# Patient Record
Sex: Male | Born: 1945 | Race: White | Hispanic: No | Marital: Married | Smoking: Former smoker
Health system: Southern US, Community
[De-identification: ages and names within clinical notes are randomized; demographics above are authoritative.]

## PROBLEM LIST (undated history)

## (undated) DIAGNOSIS — R351 Nocturia: Principal | ICD-10-CM

## (undated) DIAGNOSIS — M1711 Unilateral primary osteoarthritis, right knee: Secondary | ICD-10-CM

## (undated) DIAGNOSIS — E785 Hyperlipidemia, unspecified: Secondary | ICD-10-CM

## (undated) DIAGNOSIS — C801 Malignant (primary) neoplasm, unspecified: Secondary | ICD-10-CM

## (undated) DIAGNOSIS — R55 Syncope and collapse: Secondary | ICD-10-CM

## (undated) DIAGNOSIS — I4510 Unspecified right bundle-branch block: Secondary | ICD-10-CM

## (undated) DIAGNOSIS — I4891 Unspecified atrial fibrillation: Secondary | ICD-10-CM

## (undated) DIAGNOSIS — E291 Testicular hypofunction: Secondary | ICD-10-CM

## (undated) DIAGNOSIS — Z8489 Family history of other specified conditions: Secondary | ICD-10-CM

## (undated) DIAGNOSIS — N529 Male erectile dysfunction, unspecified: Secondary | ICD-10-CM

## (undated) DIAGNOSIS — Z950 Presence of cardiac pacemaker: Secondary | ICD-10-CM

## (undated) DIAGNOSIS — M5136 Other intervertebral disc degeneration, lumbar region: Secondary | ICD-10-CM

## (undated) DIAGNOSIS — R3915 Urgency of urination: Secondary | ICD-10-CM

## (undated) DIAGNOSIS — Z7982 Long term (current) use of aspirin: Secondary | ICD-10-CM

## (undated) DIAGNOSIS — C4491 Basal cell carcinoma of skin, unspecified: Secondary | ICD-10-CM

## (undated) DIAGNOSIS — K219 Gastro-esophageal reflux disease without esophagitis: Secondary | ICD-10-CM

## (undated) DIAGNOSIS — I700 Atherosclerosis of aorta: Secondary | ICD-10-CM

## (undated) DIAGNOSIS — I493 Ventricular premature depolarization: Secondary | ICD-10-CM

## (undated) DIAGNOSIS — G4730 Sleep apnea, unspecified: Secondary | ICD-10-CM

## (undated) DIAGNOSIS — M5416 Radiculopathy, lumbar region: Secondary | ICD-10-CM

## (undated) DIAGNOSIS — M51369 Other intervertebral disc degeneration, lumbar region without mention of lumbar back pain or lower extremity pain: Secondary | ICD-10-CM

## (undated) DIAGNOSIS — M5126 Other intervertebral disc displacement, lumbar region: Secondary | ICD-10-CM

## (undated) DIAGNOSIS — I10 Essential (primary) hypertension: Secondary | ICD-10-CM

## (undated) DIAGNOSIS — N401 Enlarged prostate with lower urinary tract symptoms: Secondary | ICD-10-CM

## (undated) DIAGNOSIS — R0609 Other forms of dyspnea: Secondary | ICD-10-CM

## (undated) DIAGNOSIS — M1990 Unspecified osteoarthritis, unspecified site: Secondary | ICD-10-CM

## (undated) DIAGNOSIS — I480 Paroxysmal atrial fibrillation: Secondary | ICD-10-CM

## (undated) DIAGNOSIS — Z7901 Long term (current) use of anticoagulants: Secondary | ICD-10-CM

## (undated) DIAGNOSIS — I639 Cerebral infarction, unspecified: Secondary | ICD-10-CM

## (undated) DIAGNOSIS — C4492 Squamous cell carcinoma of skin, unspecified: Secondary | ICD-10-CM

## (undated) DIAGNOSIS — D759 Disease of blood and blood-forming organs, unspecified: Secondary | ICD-10-CM

## (undated) DIAGNOSIS — I442 Atrioventricular block, complete: Secondary | ICD-10-CM

## (undated) DIAGNOSIS — R350 Frequency of micturition: Secondary | ICD-10-CM

## (undated) HISTORY — DX: Testicular hypofunction: E29.1

## (undated) HISTORY — DX: Essential (primary) hypertension: I10

## (undated) HISTORY — DX: Male erectile dysfunction, unspecified: N52.9

## (undated) HISTORY — PX: UVULOPALATOPHARYNGOPLASTY (UPPP)/TONSILLECTOMY/SEPTOPLASTY: SHX6164

## (undated) HISTORY — DX: Presence of cardiac pacemaker: Z95.0

## (undated) HISTORY — DX: Sleep apnea, unspecified: G47.30

## (undated) HISTORY — DX: Unspecified osteoarthritis, unspecified site: M19.90

## (undated) HISTORY — DX: Benign prostatic hyperplasia with lower urinary tract symptoms: N40.1

## (undated) HISTORY — DX: Urgency of urination: R39.15

## (undated) HISTORY — DX: Frequency of micturition: R35.0

## (undated) HISTORY — DX: Paroxysmal atrial fibrillation: I48.0

## (undated) HISTORY — PX: INGUINAL HERNIA REPAIR: SUR1180

## (undated) HISTORY — DX: Hyperlipidemia, unspecified: E78.5

## (undated) HISTORY — DX: Nocturia: R35.1

---

## 1993-08-27 HISTORY — PX: KNEE SURGERY: SHX244

## 2000-08-27 HISTORY — PX: EYE SURGERY: SHX253

## 2004-08-04 ENCOUNTER — Ambulatory Visit: Payer: Self-pay | Admitting: Unknown Physician Specialty

## 2007-11-05 ENCOUNTER — Ambulatory Visit: Payer: Self-pay | Admitting: Unknown Physician Specialty

## 2010-09-14 ENCOUNTER — Ambulatory Visit: Payer: Self-pay | Admitting: Internal Medicine

## 2010-10-05 ENCOUNTER — Ambulatory Visit: Payer: Self-pay | Admitting: Internal Medicine

## 2012-12-11 ENCOUNTER — Ambulatory Visit: Payer: Self-pay | Admitting: Unknown Physician Specialty

## 2012-12-12 LAB — PATHOLOGY REPORT

## 2013-07-14 ENCOUNTER — Ambulatory Visit: Payer: Self-pay | Admitting: Otolaryngology

## 2013-07-14 LAB — POTASSIUM: Potassium: 3.6 mmol/L (ref 3.5–5.1)

## 2013-07-30 ENCOUNTER — Ambulatory Visit: Payer: Self-pay | Admitting: Otolaryngology

## 2013-08-04 LAB — PATHOLOGY REPORT

## 2014-01-08 IMAGING — US US ABDOMEN COMPLETE
1 series · 14 of 25 positions shown · non-contrast
Comparison: None.

CLINICAL DATA: Right upper quadrant pain

EXAM:
ULTRASOUND ABDOMEN COMPLETE

[Series 1: us abdomen complete · 0.25mm/px · 14 of 94 slices shown]
[im 1/94]
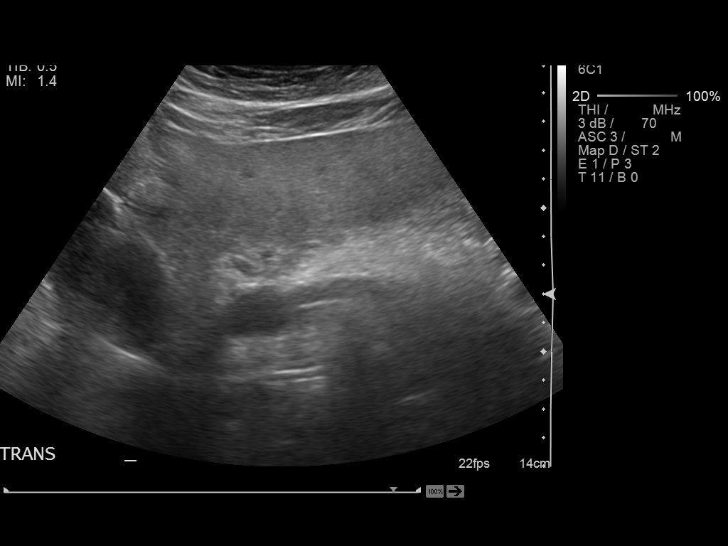
[im 8/94]
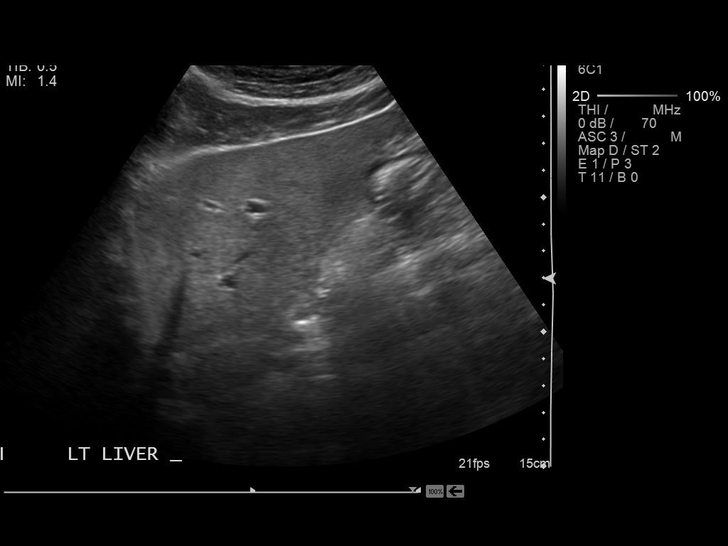
[im 16/94]
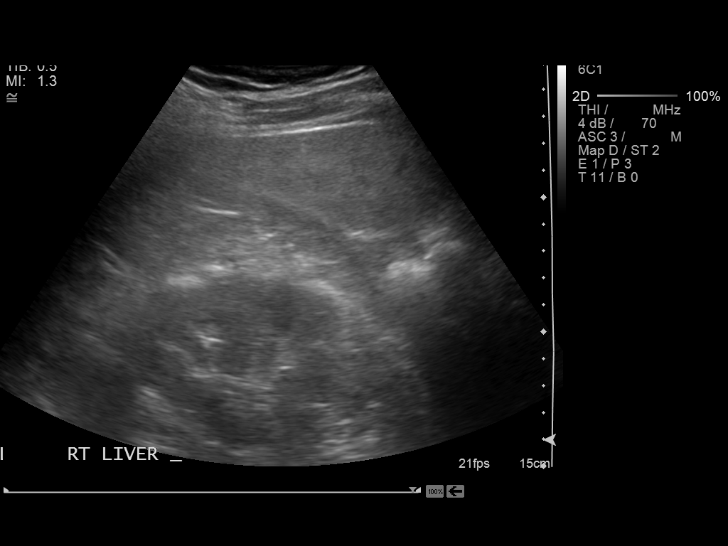
[im 24/94]
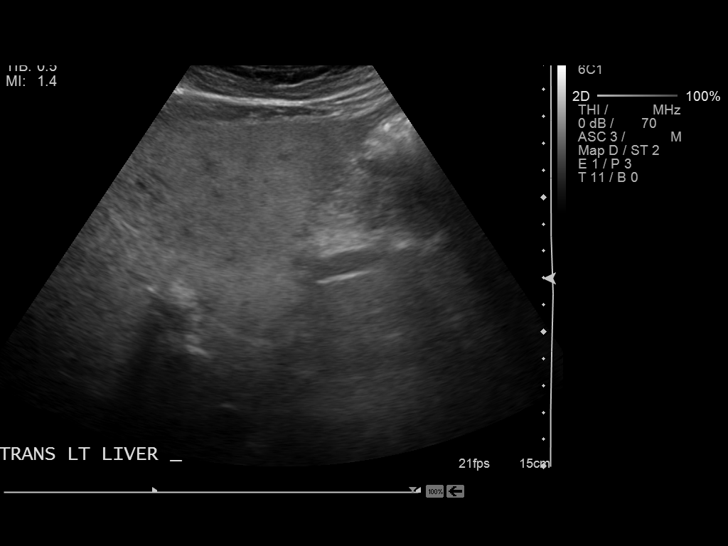
[im 32/94]
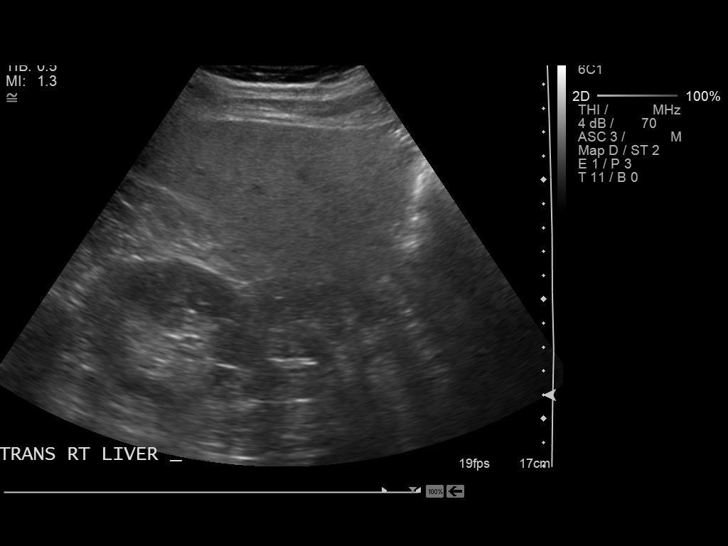
[im 35/94]
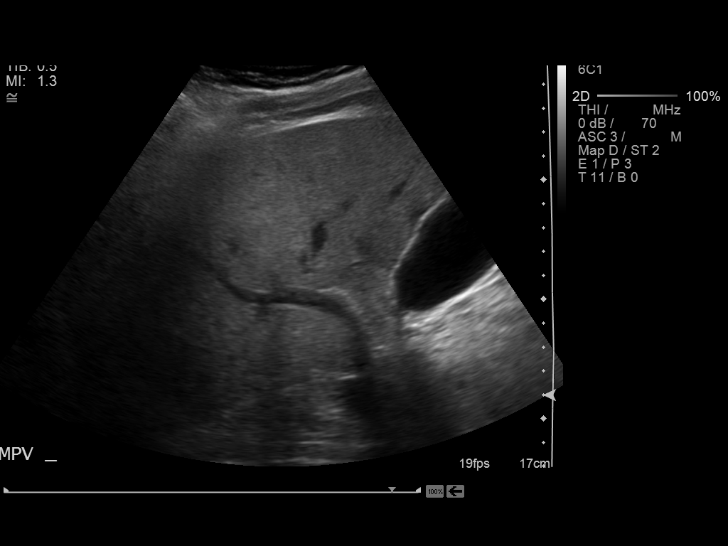
[im 43/94]
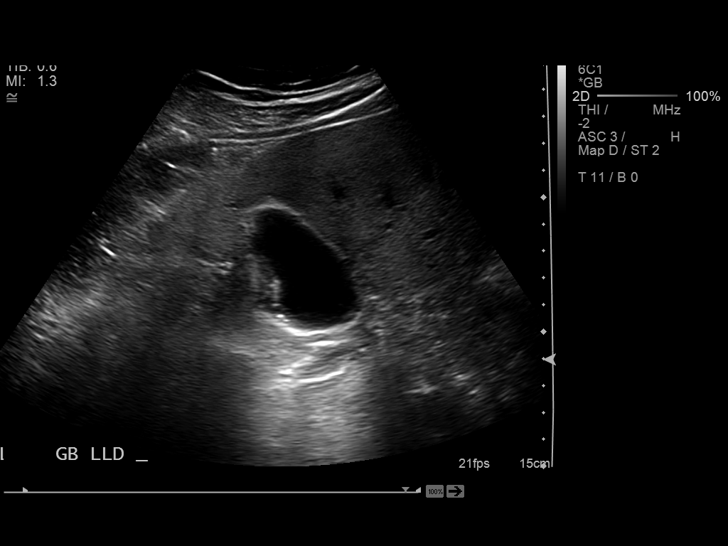
[im 51/94]
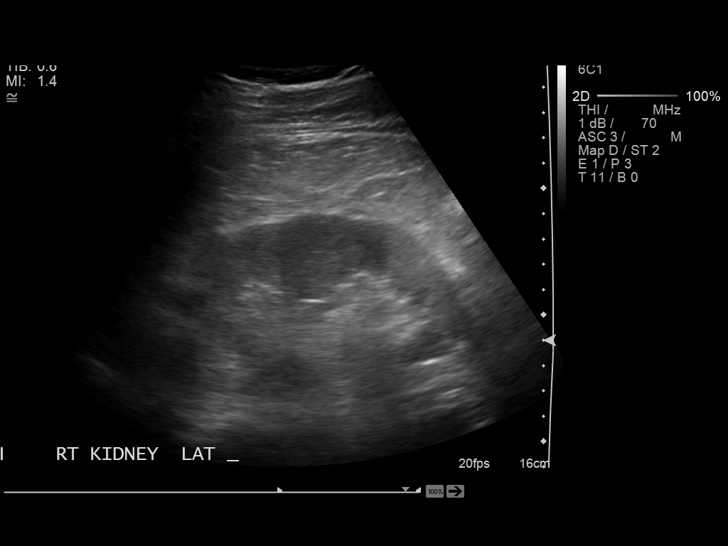
[im 59/94]
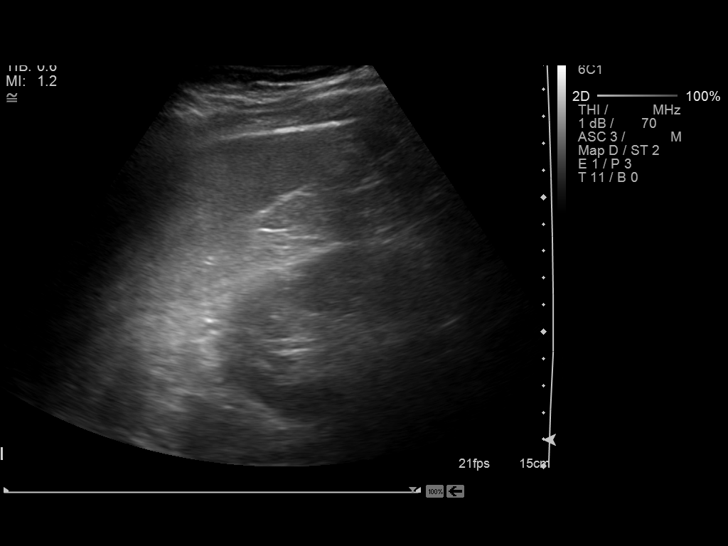
[im 63/94]
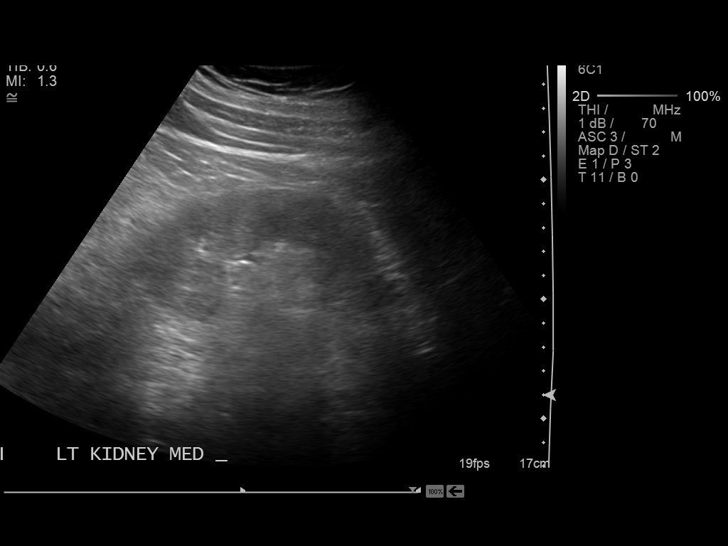
[im 70/94]
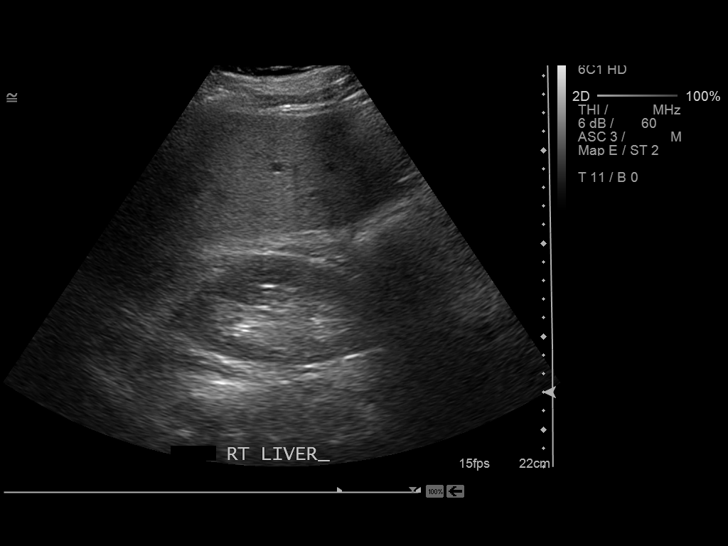
[im 78/94]
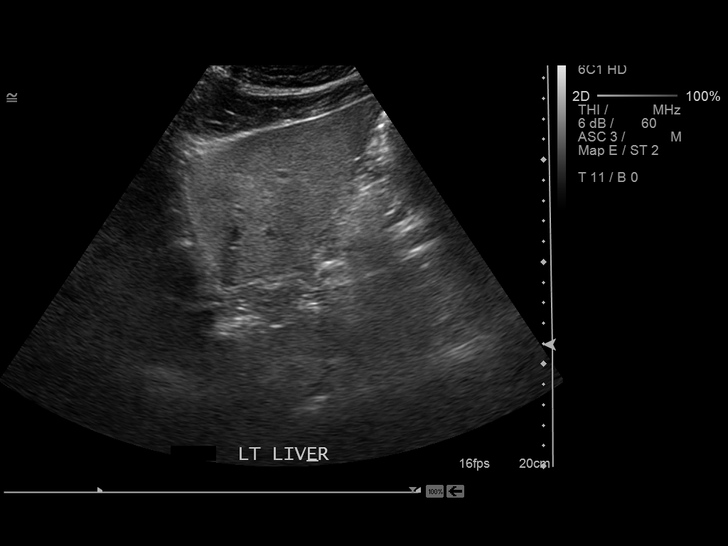
[im 86/94]
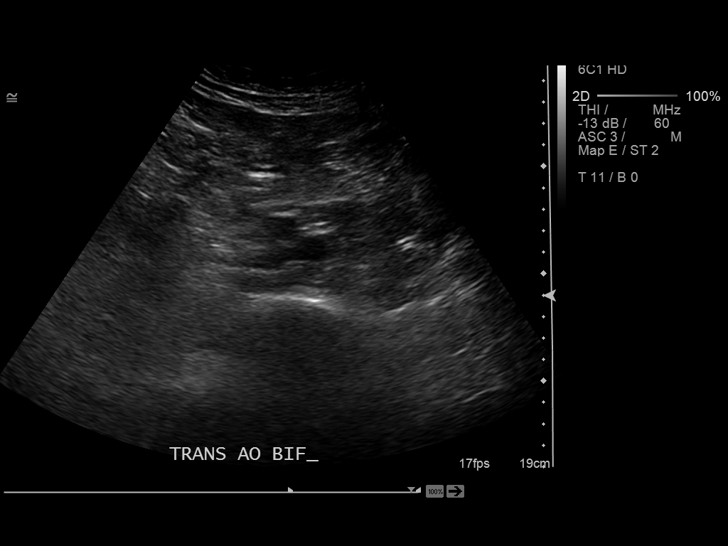
[im 94/94]
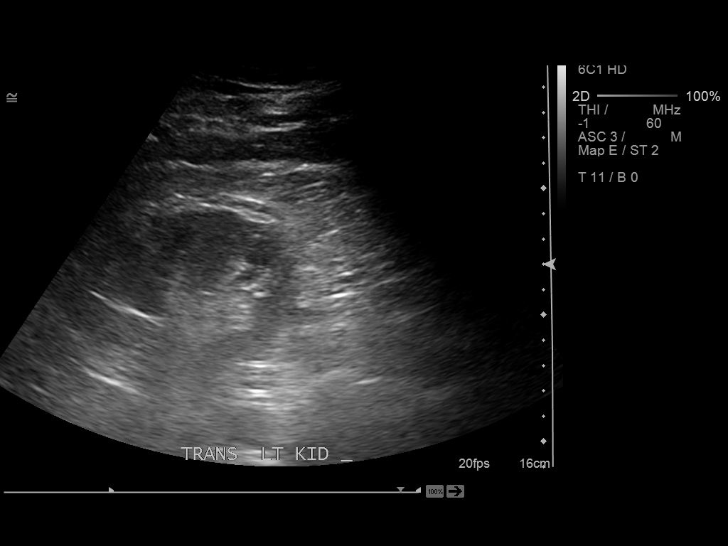

[14 of 25 positions shown; findings below may reference images not displayed]

FINDINGS: Gallbladder: Several gallstones are present. The largest is 3 mm. No
wall thickening or Murphy's sign.

Common bile duct: Diameter: 2 mm

Liver: Increased echogenicity throughout the liver is present
without focal mass.

IVC: No abnormality visualized.

Pancreas: Visualized portion unremarkable.

Spleen: Size and appearance within normal limits.

Right Kidney: Length: 12.9 cm. Echogenicity within normal limits. No
mass or hydronephrosis visualized.

Left Kidney: Length: 12.7 cm. Echogenicity within normal limits. No
mass or hydronephrosis visualized.

Abdominal aorta: Maximal aortic diameter is 2.7 cm.

Other findings: None.
IMPRESSION: Cholelithiasis.

Diffuse hepatic steatosis.

Maximal aortic diameter is 2.7 cm. Ectatic abdominal aorta at risk
for aneurysm development. Recommend followup by ultrasound in 5
years. This recommendation follows ACR consensus guidelines: White
Paper of the ACR Incidental Findings Committee II on Vascular
Findings. [HOSPITAL] [7X]; [DATE].

## 2014-12-17 NOTE — Op Note (Signed)
PATIENT NAME:  Tyrone Luna, Tyrone Luna MR#:  [id number] DATE OF BIRTH:  October 14, 1945  DATE OF PROCEDURE:  07/30/2013  SURGEON:  Janalee Dane, MD  PREOPERATIVE DIAGNOSIS: Obstructive sleep apnea (intolerance of CPAP).  POSTOPERATIVE DIAGNOSIS: Obstructive sleep apnea (intolerance of CPAP).  PROCEDURE:  Uvulopalatopharyngoplasty with tonsillectomy   DESCRIPTION OF PROCEDURE: The patient was identified in the holding area, brought back to the operating room and placed in the supine position on the operating room table. After general endotracheal anesthesia had been induced, the patient was turned 90 degrees clockwise from anesthesia. A Dingman mouth retractor was placed and the oropharynx was carefully examined. There were small atrophic tonsils and the uvula was thickened and elongated. Approximately 12 mm of soft palate was estimated for safe removal. Beginning on the patient's left side, the tonsil was grasped with tonsil tenaculum retracted to the midline and Bovie electrocautery was used to dissect the tonsil from the tonsillar fossa. An identical procedure was performed on the right tonsil and cautery was used to stop pinpoint bleeding. Darts were then taken from the posterior tonsillar pillar posteriorly using Bovie electrocautery. The amount of soft palate that needed to be resected was resected with Bovie electrocautery and the nasopharyngeal mucosa with muscle to add to the purchase of the suture was sutured to the oropharyngeal muscle and mucosa. The anterior and posterior tonsillar pillars were sutured in a similar fashion. Once the tonsillar pillars and the nasopharyngeal and oropharyngeal closures had been accomplished, 0.5% plain bupivacaine was injected. The patient was then returned to anesthesia, allowed to emerge from anesthesia in the operating room, taken to the recovery room in stable condition. There were no complications. Estimated blood loss 15 mL.   ____________________________ J.  Nadeen Landau, MD j[facility]:ce D: 07/30/2013 17:35:31 ET T: 07/30/2013 20:39:25 ET JOB#: [id number]  cc: Janalee Dane, MD, <Dictator> Nicholos Johns MD ELECTRONICALLY SIGNED 07/31/2013 13:42

## 2015-02-02 ENCOUNTER — Other Ambulatory Visit: Payer: Self-pay | Admitting: Internal Medicine

## 2015-02-02 DIAGNOSIS — R1011 Right upper quadrant pain: Secondary | ICD-10-CM

## 2015-02-03 ENCOUNTER — Ambulatory Visit
Admission: RE | Admit: 2015-02-03 | Discharge: 2015-02-03 | Disposition: A | Discharge status: Home / Self Care | Payer: Medicare Other | Source: Ambulatory Visit | Attending: Internal Medicine | Admitting: Internal Medicine

## 2015-02-03 DIAGNOSIS — K8020 Calculus of gallbladder without cholecystitis without obstruction: Secondary | ICD-10-CM | POA: Insufficient documentation

## 2015-02-03 DIAGNOSIS — K760 Fatty (change of) liver, not elsewhere classified: Secondary | ICD-10-CM | POA: Diagnosis not present

## 2015-02-03 DIAGNOSIS — R1011 Right upper quadrant pain: Secondary | ICD-10-CM

## 2015-03-22 ENCOUNTER — Ambulatory Visit (INDEPENDENT_AMBULATORY_CARE_PROVIDER_SITE_OTHER): Payer: Medicare Other | Admitting: Urology

## 2015-03-22 ENCOUNTER — Encounter: Payer: Self-pay | Admitting: Urology

## 2015-03-22 VITALS — BP 165/87 | HR 60 | Ht 73.0 in | Wt 262.8 lb

## 2015-03-22 DIAGNOSIS — G4730 Sleep apnea, unspecified: Secondary | ICD-10-CM | POA: Insufficient documentation

## 2015-03-22 DIAGNOSIS — I10 Essential (primary) hypertension: Secondary | ICD-10-CM | POA: Insufficient documentation

## 2015-03-22 DIAGNOSIS — N528 Other male erectile dysfunction: Secondary | ICD-10-CM | POA: Diagnosis not present

## 2015-03-22 DIAGNOSIS — N401 Enlarged prostate with lower urinary tract symptoms: Secondary | ICD-10-CM | POA: Diagnosis not present

## 2015-03-22 DIAGNOSIS — E785 Hyperlipidemia, unspecified: Secondary | ICD-10-CM | POA: Insufficient documentation

## 2015-03-22 DIAGNOSIS — R351 Nocturia: Secondary | ICD-10-CM | POA: Diagnosis not present

## 2015-03-22 DIAGNOSIS — E291 Testicular hypofunction: Secondary | ICD-10-CM | POA: Insufficient documentation

## 2015-03-22 DIAGNOSIS — N529 Male erectile dysfunction, unspecified: Secondary | ICD-10-CM | POA: Insufficient documentation

## 2015-03-22 DIAGNOSIS — Z9109 Other allergy status, other than to drugs and biological substances: Secondary | ICD-10-CM | POA: Insufficient documentation

## 2015-03-22 LAB — MICROSCOPIC EXAMINATION
Bacteria, UA: NONE SEEN
Epithelial Cells (non renal): NONE SEEN /hpf (ref 0–10)
RBC, UA: NONE SEEN /hpf (ref 0–?)

## 2015-03-22 LAB — URINALYSIS, COMPLETE
Bilirubin, UA: NEGATIVE
Glucose, UA: NEGATIVE
Ketones, UA: NEGATIVE
Leukocytes, UA: NEGATIVE
Nitrite, UA: NEGATIVE
Protein, UA: NEGATIVE
RBC, UA: NEGATIVE
Specific Gravity, UA: 1.02 (ref 1.005–1.030)
Urobilinogen, Ur: 0.2 mg/dL (ref 0.2–1.0)
pH, UA: 6.5 (ref 5.0–7.5)

## 2015-03-22 LAB — BLADDER SCAN AMB NON-IMAGING

## 2015-03-22 MED ORDER — SOLIFENACIN SUCCINATE 5 MG PO TABS: 5.0000 mg | ORAL_TABLET | Freq: Every day | ORAL | Status: DC

## 2015-03-22 MED ORDER — TADALAFIL 20 MG PO TABS: 20.0000 mg | ORAL_TABLET | Freq: Every day | ORAL | Status: DC | PRN

## 2015-03-22 MED ORDER — FESOTERODINE FUMARATE ER 4 MG PO TB24: 4.0000 mg | ORAL_TABLET | Freq: Every day | ORAL | Status: DC

## 2015-03-22 NOTE — Progress Notes (Signed)
03/22/2015 5:06 PM   Tyrone Luna [birth year] [id number]  Referring provider: No referring provider defined for this encounter.  Chief Complaint  Patient presents with  . Benign Prostatic Hypertrophy    38month   HPI: 69year old male who returns today for management of  urinary symptom primarily urgency/ frequency and nocturia.  He has kept a voiding diary which he brings with him today.    He has been taking Avodart for years now and is previously tried Rapaflo and Flomax but had to discontinue these 2 medications for various reasons including dizziness.  IPSS 21/4 at last visit, 13/3 today.    No dysuria or hematuria. History of flank pain or kidney stones. No history of urinary tract infections.  He was previously drinking  does endorse drinking 2-4 cups but has been cutting back some.  He also continues to drink 2-3 20 ounce bottles of Diet Coke each day.    He also has a history of obstructive sleep apnea and underwent a uvulectomy as well as tonsillectomy by Dr. CCarlis Abbotta few years ago. He has not had a follow-up sleep study since his surgery although his wife notes that his snoring has improved.  He has not yet seen his PCP about this, has a follow up last night.   He does also have significant bilateral large lower extremity edema which is worse in the evening time. He does not take Lasix but is on hydrochlorothiazide/triamterene.  He also has a history of baseline erectile dysfunction. Last visit he was prescribed Cialis 20 mg with some partial response.    PSA 0.66 on 04/2014.  DRE 11/2014 30 cc, no nodules, nontender.        IPSS      03/22/15 1000       International Prostate Symptom Score   How often have you had the sensation of not emptying your bladder? Less than 1 in 5     How often have you had to urinate less than every two hours? Almost always     How often have you found you stopped and started again several times when you urinated?  Less than 1 in 5 times     How often have you found it difficult to postpone urination? Less than half the time     How often have you had a weak urinary stream? Not at All     How often have you had to strain to start urination? Not at All     How many times did you typically get up at night to urinate? 4 Times     Total IPSS Score 13     Quality of Life due to urinary symptoms   If you were to spend the rest of your life with your urinary condition just the way it is now how would you feel about that? Mixed         PMH: Past Medical History  Diagnosis Date  . Arthritis   . Hyperlipemia   . Sleep apnea   . ED (erectile dysfunction)   . Male hypogonadism   . Hypertension   . BPH associated with nocturia   . Urinary frequency   . Urinary urgency     Surgical History: Past Surgical History  Procedure Laterality Date  . Eye surgery Bilateral 2002    laser  . Knee surgery Right 1995  . Inguinal hernia repair Left     Home Medications:    Medication List  This list is accurate as of: 03/22/15 11:59 PM.  Always use your most recent med list.               amLODipine 10 MG tablet  Commonly known as:  NORVASC     aspirin 81 MG tablet  Take 81 mg by mouth daily.     cetirizine 10 MG tablet  Commonly known as:  ZYRTEC  Take 10 mg by mouth daily.     dutasteride 0.5 MG capsule  Commonly known as:  AVODART     fesoterodine 4 MG Tb24 tablet  Commonly known as:  TOVIAZ  Take 1 tablet (4 mg total) by mouth daily.     GLUCOSAMINE 1500 COMPLEX PO  Take by mouth.     pilocarpine 5 MG tablet  Commonly known as:  SALAGEN     PRESERVISION AREDS 2 PO  Take by mouth.     simvastatin 20 MG tablet  Commonly known as:  ZOCOR     tadalafil 20 MG tablet  Commonly known as:  CIALIS  Take 1 tablet (20 mg total) by mouth daily as needed for erectile dysfunction.     triamterene-hydrochlorothiazide 37.5-25 MG per capsule  Commonly known as:  DYAZIDE         Allergies: No Known Allergies  Family History: Family History  Problem Relation Age of Onset  . Prostate cancer Neg Hx   . Bladder Cancer Neg Hx   . Kidney cancer Neg Hx     Social History:  reports that he has quit smoking. He does not have any smokeless tobacco history on file. He reports that he does not drink alcohol or use illicit drugs.  ROS: UROLOGY Frequent Urination?: Yes Hard to postpone urination?: No Burning/pain with urination?: No Get up at night to urinate?: Yes Leakage of urine?: No Urine stream starts and stops?: No Trouble starting stream?: No Do you have to strain to urinate?: No Blood in urine?: No Urinary tract infection?: No Sexually transmitted disease?: No Injury to kidneys or bladder?: No Painful intercourse?: No Weak stream?: No Erection problems?: Yes Penile pain?: No  Gastrointestinal Nausea?: No Vomiting?: No Indigestion/heartburn?: No Diarrhea?: No Constipation?: No  Constitutional Fever: No Night sweats?: Yes Weight loss?: No Fatigue?: No  Skin Skin rash/lesions?: No Itching?: No  Eyes Blurred vision?: No Double vision?: No  Ears/Nose/Throat Sore throat?: No Sinus problems?: No  Hematologic/Lymphatic Swollen glands?: No Easy bruising?: No  Cardiovascular Leg swelling?: Yes Chest pain?: No  Respiratory Cough?: No Shortness of breath?: No  Endocrine Excessive thirst?: No  Musculoskeletal Back pain?: No Joint pain?: No  Neurological Headaches?: No Dizziness?: No  Psychologic Depression?: No Anxiety?: No  Physical Exam: BP 165/87 mmHg  Pulse 60  Ht _0  (1.854 m)  Wt 262 lb 12.8 oz (119.205 kg)  BMI 34.68 kg/m2  Constitutional:  Alert and oriented, No acute distress. HEENT: [place] AT, moist mucus membranes.  Trachea midline, no masses. Cardiovascular: No clubbing, cyanosis, or edema. Respiratory: Normal respiratory effort, no increased work of breathing. GI: Abdomen is soft, nontender,  nondistended, no abdominal masses GU: No CVA tenderness.  Skin: No rashes, bruises or suspicious lesions. Neurologic: Grossly intact, no focal deficits, moving all 4 extremities. Psychiatric: Normal mood and affect.  Laboratory Data: Comprehensive Metabolic Panel (CMP) - Final result (02/02/2015 3:56 PM) Comprehensive Metabolic Panel (CMP) - Final result (02/02/2015 3:56 PM)  Component Value Range  Glucose 103 70-110 mg/dL  Sodium 138 136-145 mmol/L  Potassium 3.5 (L)  3.6-5.1 mmol/L  Chloride 102 97-109 mmol/L  Carbon Dioxide (CO2) 30.7 22.0-32.0 mmol/L  Urea Nitrogen (BUN) 13 7-25 mg/dL  Creatinine 0.9 0.7-1.3 mg/dL  Glomerular Filtration Rate (eGFR), MDRD Estimate 84 >60 mL/min/1.73sq m     Urinalysis Results for orders placed or performed in visit on 03/22/15  Microscopic Examination  Result Value Ref Range   WBC, UA 0-5 0 -  5 /hpf   RBC, UA None seen 0 -  2 /hpf   Epithelial Cells (non renal) None seen 0 - 10 /hpf   Bacteria, UA None seen None seen/Few  Urinalysis, Complete  Result Value Ref Range   Specific Gravity, UA 1.020 1.005 - 1.030   pH, UA 6.5 5.0 - 7.5   Color, UA Yellow Yellow   Appearance Ur Clear Clear   Leukocytes, UA Negative Negative   Protein, UA Negative Negative/Trace   Glucose, UA Negative Negative   Ketones, UA Negative Negative   RBC, UA Negative Negative   Bilirubin, UA Negative Negative   Urobilinogen, Ur 0.2 0.2 - 1.0 mg/dL   Nitrite, UA Negative Negative   Microscopic Examination See below:   BLADDER SCAN AMB NON-IMAGING  Result Value Ref Range   Scan Result 22m     Pertinent Imaging: n/a  Assessment & Plan:  69year old male with urinary frequency and nocturia.  No evidence of urinary retention, overflow, or UTI.  Again, continue to reiterate today that his voiding symptoms are likely multifactorial. I suspect that his OSA is not adequately treated as he continues to snore. He also has bilateral lower extremity edema and per his  voiding diary, does continue to drink coffee, cold, and other diabetics although has been cutting back.  I continue to recommend further workup/ follow up of his OSA as well as behavioral modification which is likely the key issue. He would like to try a medication for bladder overactivity therefore we'll trial Toviaz 4 mg daily, 2 weeks of samples given today.   1. BPH associated with nocturia Trial of Toviaz, we'll reassess symptoms in 6 weeks. Common side effects of anticholinergic medications were discussed. Patient urged to call if no difference in medication, can switch over the phone prior to next visit as needed. We'll check bladder scan next visit. - Urinalysis, Complete - BLADDER SCAN AMB NON-IMAGING - fesoterodine (TOVIAZ) 4 MG TB24 tablet; Take 1 tablet (4 mg total) by mouth daily.  Dispense: 30 tablet; Refill: 5 - Microscopic Examination  2. Other male erectile dysfunction Patient had decent response to Cialis, would like a prescription. - tadalafil (CIALIS) 20 MG tablet; Take 1 tablet (20 mg total) by mouth daily as needed for erectile dysfunction.  Dispense: 6 tablet; Refill: 11   Return in about 6 weeks (around 05/03/2015) for IPSS, PVR.  AHollice Espy MD  [facility] [address] [place] [phone]

## 2015-03-23 ENCOUNTER — Encounter: Payer: Self-pay | Admitting: Urology

## 2015-04-26 ENCOUNTER — Encounter: Payer: Self-pay | Admitting: *Deleted

## 2015-05-03 ENCOUNTER — Ambulatory Visit: Payer: Self-pay | Admitting: Urology

## 2015-05-05 ENCOUNTER — Ambulatory Visit (INDEPENDENT_AMBULATORY_CARE_PROVIDER_SITE_OTHER): Payer: Medicare Other | Admitting: Urology

## 2015-05-05 ENCOUNTER — Encounter: Payer: Self-pay | Admitting: Urology

## 2015-05-05 VITALS — BP 155/75 | HR 53 | Ht 73.0 in | Wt 264.6 lb

## 2015-05-05 DIAGNOSIS — N138 Other obstructive and reflux uropathy: Secondary | ICD-10-CM | POA: Insufficient documentation

## 2015-05-05 DIAGNOSIS — N401 Enlarged prostate with lower urinary tract symptoms: Secondary | ICD-10-CM

## 2015-05-05 DIAGNOSIS — N528 Other male erectile dysfunction: Secondary | ICD-10-CM | POA: Diagnosis not present

## 2015-05-05 DIAGNOSIS — R351 Nocturia: Secondary | ICD-10-CM | POA: Diagnosis not present

## 2015-05-05 DIAGNOSIS — N529 Male erectile dysfunction, unspecified: Secondary | ICD-10-CM

## 2015-05-05 LAB — BLADDER SCAN AMB NON-IMAGING: Scan Result: 43

## 2015-05-05 MED ORDER — FESOTERODINE FUMARATE ER 8 MG PO TB24: 8.0000 mg | ORAL_TABLET | Freq: Every day | ORAL | Status: DC

## 2015-05-05 NOTE — Progress Notes (Signed)
05/05/2015 11:24 AM   Tyrone Luna May 28, 1946 [id number]  Referring provider: [name], MD [place], [place] [address]  Chief Complaint  Patient presents with  . BPH with nocturia    6 week recheck  . Erectile Dysfunction    HPI: Patient is a 69 year old white male who presents to [place] for 6 week follow-up after he placed on Toviaz 4 mg daily for nocturia.  BPH WITH LUTS His IPSS score today is 13 , which is moderate lower urinary tract symptomatology. He is mostly satisfied with his quality life due to his urinary symptoms. His PVR is 43 mL.  His previous IPSS score was 13/3.  His previous PVR is 49 mL.  His major complaint today nocturia.  He has had these symptoms for the last several years.  He denies any dysuria, hematuria or suprapubic pain.  He currently taking dutasteride 0.5 mg daily.   He could not tolerate alpha-blockers due to dizziness.  He also denies any recent fevers, chills, nausea or vomiting.  He does not have a family history of PCa.      IPSS      03/22/15 1000 05/05/15 0800     International Prostate Symptom Score   How often have you had the sensation of not emptying your bladder? Less than 1 in 5 Not at All    How often have you had to urinate less than every two hours? Almost always More than half the time    How often have you found you stopped and started again several times when you urinated? Less than 1 in 5 times Less than half the time    How often have you found it difficult to postpone urination? Less than half the time Less than half the time    How often have you had a weak urinary stream? Not at All Less than 1 in 5 times    How often have you had to strain to start urination? Not at All Not at All    How many times did you typically get up at night to urinate? 4 Times 4 Times    Total IPSS Score 13 13    Quality of Life due to urinary symptoms   If you were to spend the rest of your life with your urinary  condition just the way it is now how would you feel about that? Mixed Mostly Satisfied       Score:  1-7 Mild 8-19 Moderate 20-35 Severe      PMH: Past Medical History  Diagnosis Date  . Arthritis   . Hyperlipemia   . Sleep apnea   . ED (erectile dysfunction)   . Male hypogonadism   . Hypertension   . BPH associated with nocturia   . Urinary frequency   . Urinary urgency   . Erectile dysfunction     Surgical History: Past Surgical History  Procedure Laterality Date  . Eye surgery Bilateral 2002    laser  . Knee surgery Right 1995  . Inguinal hernia repair Left     Home Medications:    Medication List       This list is accurate as of: 05/05/15 11:24 AM.  Always use your most recent med list.               amLODipine 10 MG tablet  Commonly known as:  NORVASC     aspirin 81 MG tablet  Take 81 mg by mouth  daily.     cetirizine 10 MG tablet  Commonly known as:  ZYRTEC  Take 10 mg by mouth daily.     dutasteride 0.5 MG capsule  Commonly known as:  AVODART     EPIPEN 2-PAK 0.3 mg/0.3 mL Soaj injection  Generic drug:  EPINEPHrine     fesoterodine 8 MG Tb24 tablet  Commonly known as:  TOVIAZ  Take 1 tablet (8 mg total) by mouth daily.     GLUCOSAMINE 1500 COMPLEX PO  Take by mouth.     pilocarpine 5 MG tablet  Commonly known as:  SALAGEN     PRESERVISION AREDS 2 PO  Take by mouth.     simvastatin 20 MG tablet  Commonly known as:  ZOCOR     tadalafil 20 MG tablet  Commonly known as:  CIALIS  Take 1 tablet (20 mg total) by mouth daily as needed for erectile dysfunction.     triamterene-hydrochlorothiazide 37.5-25 MG per capsule  Commonly known as:  DYAZIDE        Allergies: No Known Allergies  Family History: Family History  Problem Relation Age of Onset  . Prostate cancer Neg Hx   . Bladder Cancer Neg Hx   . Kidney cancer Neg Hx   . Hypertension      Social History:  reports that he has quit smoking. He does not have any  smokeless tobacco history on file. He reports that he does not drink alcohol or use illicit drugs.  ROS: UROLOGY Frequent Urination?: Yes Hard to postpone urination?: No Burning/pain with urination?: No Get up at night to urinate?: Yes Leakage of urine?: No Urine stream starts and stops?: No Trouble starting stream?: No Do you have to strain to urinate?: No Blood in urine?: No Urinary tract infection?: No Sexually transmitted disease?: No Injury to kidneys or bladder?: No Painful intercourse?: No Weak stream?: No Erection problems?: Yes Penile pain?: No  Gastrointestinal Nausea?: No Vomiting?: No Indigestion/heartburn?: No Diarrhea?: No Constipation?: No  Constitutional Fever: No Night sweats?: Yes Weight loss?: No Fatigue?: No  Skin Skin rash/lesions?: No Itching?: No  Eyes Blurred vision?: No Double vision?: No  Ears/Nose/Throat Sore throat?: No Sinus problems?: No  Hematologic/Lymphatic Swollen glands?: No Easy bruising?: No  Cardiovascular Leg swelling?: No Chest pain?: No  Respiratory Cough?: No Shortness of breath?: No  Endocrine Excessive thirst?: No  Musculoskeletal Back pain?: No Joint pain?: No  Neurological Headaches?: No Dizziness?: No  Psychologic Depression?: No Anxiety?: No  Physical Exam: BP 155/75 mmHg  Pulse 53  Ht 6\' 1"  (1.854 m)  Wt 264 lb 9.6 oz (120.022 kg)  BMI 34.92 kg/m2   Laboratory Data:  PSA History:   0.66 ng/mL on 05/14/2014  Pertinent Imaging: Results for orders placed or performed in visit on 05/05/15  BLADDER SCAN AMB NON-IMAGING  Result Value Ref Range   Scan Result 43     Assessment & Plan:    1. BPH (benign prostatic hyperplasia) with LUTS:   Patient currently on Avodart for his BPH with LUTS.  He is still experiencing nocturia x 4.  IPSS today was 13/2.  Last visit was 13/3.  PVR is unchanged.  He will continue the Avodart.  A refill was not needed at this time.    - BLADDER SCAN AMB  NON-IMAGING  2. Nocturia:  He did not find the Toviaz 4 mg very helpful in controlling his nocturia.  I reemphasized  to the patient that nocturia is often multi-factorial and difficult  to treat.  Sleeping disorders, heart conditions and peripheral vascular disease, diabetes,  enlarged prostate or urethral stricture causing bladder outlet obstruction and/or certain medications.  He is still consuming diet [employer].  I have suggested that the patient avoid caffeine starting at noon.  He may also benefit from fluid restrictions after 6:00 in the evening and voiding just prior to bedtime.  The patient may also benefit from a discussion with his primary care physician to see if he has risk factors for sleep apnea or other sleep disturbances and obtaining a sleep study, as Dr. Erlene Quan had discussed with him at his previous visit.  He would like to try a higher dose of his Toviaz to 8 mg and see if it gives him less nocturia.  He will RTC in 6 weeks for IPSS score and PVR.    3. Erectile dysfunction:   Patient did not find the Cialis as effective as he had in the past.  I explained to him that untreated sleep apnea can contribute to erectile dysfunction. I also explained how his dietary choices and weight gain of 10 pounds over the last 2 years can also contribute to erectile dysfunction.  He'll like to see his erections improved with dietary changes and we will discuss it again when he returns in 6 weeks for  SHIM score.   Return in about 6 weeks (around 06/16/2015) for IPSS and PVR.  Zara Council, [facility] [address], [place], [place] [address] [phone]

## 2015-06-16 ENCOUNTER — Ambulatory Visit (INDEPENDENT_AMBULATORY_CARE_PROVIDER_SITE_OTHER): Payer: Medicare Other | Admitting: Urology

## 2015-06-16 ENCOUNTER — Encounter: Payer: Self-pay | Admitting: Urology

## 2015-06-16 VITALS — BP 160/78 | HR 66 | Ht 73.0 in | Wt 260.9 lb

## 2015-06-16 DIAGNOSIS — N401 Enlarged prostate with lower urinary tract symptoms: Secondary | ICD-10-CM | POA: Diagnosis not present

## 2015-06-16 DIAGNOSIS — N528 Other male erectile dysfunction: Secondary | ICD-10-CM | POA: Diagnosis not present

## 2015-06-16 DIAGNOSIS — N529 Male erectile dysfunction, unspecified: Secondary | ICD-10-CM

## 2015-06-16 DIAGNOSIS — R351 Nocturia: Secondary | ICD-10-CM

## 2015-06-16 DIAGNOSIS — N138 Other obstructive and reflux uropathy: Secondary | ICD-10-CM

## 2015-06-16 LAB — BLADDER SCAN AMB NON-IMAGING: Scan Result: 0

## 2015-06-16 NOTE — Progress Notes (Signed)
06/16/2015 8:52 AM   Tyrone Luna 11-02-1945 [id number]  Referring provider: [name], MD [facility] [employer] [place], [place] [address]  Chief Complaint  Patient presents with  . Benign Prostatic Hypertrophy    with nocturia    6 week followup  . Erectile Dysfunction    HPI: Patient is a 69 year old white male who presents to [place] for 6 week follow-up after he placed on Toviaz 8 mg daily for nocturia.   BPH WITH LUTS His IPSS score today is 4, which is mild lower urinary tract symptomatology. He is mostly satisfied with his quality life due to his urinary symptoms. His PVR is 0 mL.  His previous IPSS score was 13/2.  His previous PVR is  43 mL.  His major complaint today nocturia x 2.  He has been reduced since the increase in his Toviaz from 4 mg to 8mg .  He has had these symptoms for several years.  He denies any dysuria, hematuria or suprapubic pain.  He currently taking dutasteride 0.5 mg daily and Toviaz 8 mg daily.   He could not tolerate alpha-blockers due to dizziness.  He also denies any recent fevers, chills, nausea or vomiting.  He does not have a family history of PCa.      IPSS      05/05/15 0800 06/16/15 0800     International Prostate Symptom Score   How often have you had the sensation of not emptying your bladder? Not at All Less than 1 in 5    How often have you had to urinate less than every two hours? More than half the time Less than 1 in 5 times    How often have you found you stopped and started again several times when you urinated? Less than half the time Not at All    How often have you found it difficult to postpone urination? Less than half the time Not at All    How often have you had a weak urinary stream? Less than 1 in 5 times Not at All    How often have you had to strain to start urination? Not at All Not at All    How many times did you typically get up at night to urinate? 4 Times 2 Times    Total IPSS Score 13 4     Quality of Life due to urinary symptoms   If you were to spend the rest of your life with your urinary condition just the way it is now how would you feel about that? Mostly Satisfied Mostly Satisfied       Score:  1-7 Mild 8-19 Moderate 20-35 Severe   Nocturia Patient has found a reduction in his nocturia from 4 times nightly to 2 times nightly with the increase of his Toviaz 8 mg.  He has not suffered any untoward side effects such as dry mouth and constipation. He states that he is primary care physician is rescheduling a sleep study in the near future.  Erectile dysfunction His SHIM score is 17, which is mild ED.   He has been having difficulty with erections for several months.   His major complaint is achieving.  His libido is preserved.   His risk factors for ED are sleep apnea, hyperlipidemia, HTN and BPH.  He denies any painful erections or curvatures with his erections.   He has tried Cialis in the past.  SHIM      06/16/15 0832       SHIM: Over the last 6 months:   How do you rate your confidence that you could get and keep an erection? Very Low     When you had erections with sexual stimulation, how often were your erections hard enough for penetration (entering your partner)? Almost Never or Never     During sexual intercourse, how often were you able to maintain your erection after you had penetrated (entered) your partner? Not Difficult     During sexual intercourse, how difficult was it to maintain your erection to completion of intercourse? Not Difficult     When you attempted sexual intercourse, how often was it satisfactory for you? Not Difficult     SHIM Total Score   SHIM 17        Score: 1-7 Severe ED 8-11 Moderate ED 12-16 Mild-Moderate ED 17-21 Mild ED 22-25 No ED   PMH: Past Medical History  Diagnosis Date  . Arthritis   . Hyperlipemia   . Sleep apnea   . ED (erectile dysfunction)   . Male hypogonadism   . Hypertension   . BPH  associated with nocturia   . Urinary frequency   . Urinary urgency   . Erectile dysfunction     Surgical History: Past Surgical History  Procedure Laterality Date  . Eye surgery Bilateral 2002    laser  . Knee surgery Right 1995  . Inguinal hernia repair Left     Home Medications:    Medication List       This list is accurate as of: 06/16/15  8:52 AM.  Always use your most recent med list.               amLODipine 10 MG tablet  Commonly known as:  NORVASC     aspirin 81 MG tablet  Take 81 mg by mouth daily.     cetirizine 10 MG tablet  Commonly known as:  ZYRTEC  Take 10 mg by mouth daily.     dutasteride 0.5 MG capsule  Commonly known as:  AVODART     EPIPEN 2-PAK 0.3 mg/0.3 mL Soaj injection  Generic drug:  EPINEPHrine     fesoterodine 8 MG Tb24 tablet  Commonly known as:  TOVIAZ  Take 1 tablet (8 mg total) by mouth daily.     GLUCOSAMINE 1500 COMPLEX PO  Take by mouth.     pilocarpine 5 MG tablet  Commonly known as:  SALAGEN     PRESERVISION AREDS 2 PO  Take by mouth.     simvastatin 20 MG tablet  Commonly known as:  ZOCOR     tadalafil 20 MG tablet  Commonly known as:  CIALIS  Take 1 tablet (20 mg total) by mouth daily as needed for erectile dysfunction.     triamterene-hydrochlorothiazide 37.5-25 MG capsule  Commonly known as:  DYAZIDE        Allergies: No Known Allergies  Family History: Family History  Problem Relation Age of Onset  . Prostate cancer Neg Hx   . Bladder Cancer Neg Hx   . Kidney cancer Neg Hx   . Hypertension      Social History:  reports that he has quit smoking. He does not have any smokeless tobacco history on file. He reports that he does not drink alcohol or use illicit drugs.  ROS: UROLOGY Frequent Urination?: No Hard to postpone urination?: No Burning/pain with urination?: No Get  up at night to urinate?: Yes Leakage of urine?: No Urine stream starts and stops?: No Trouble starting stream?: No Do  you have to strain to urinate?: No Blood in urine?: No Urinary tract infection?: No Sexually transmitted disease?: No Injury to kidneys or bladder?: No Painful intercourse?: No Weak stream?: No Erection problems?: Yes Penile pain?: No  Gastrointestinal Nausea?: No Vomiting?: No Indigestion/heartburn?: No Diarrhea?: No Constipation?: No  Constitutional Fever: No Night sweats?: No Weight loss?: No Fatigue?: No  Skin Skin rash/lesions?: No Itching?: No  Eyes Blurred vision?: No Double vision?: No  Ears/Nose/Throat Sore throat?: No Sinus problems?: No  Hematologic/Lymphatic Swollen glands?: No Easy bruising?: No  Cardiovascular Leg swelling?: No Chest pain?: No  Respiratory Cough?: No Shortness of breath?: No  Endocrine Excessive thirst?: No  Musculoskeletal Back pain?: No Joint pain?: No  Neurological Headaches?: No Dizziness?: No  Psychologic Depression?: No Anxiety?: No  Physical Exam: BP 160/78 mmHg  Pulse 66  Ht 6\' 1"  (1.854 m)  Wt 260 lb 14.4 oz (118.343 kg)  BMI 34.43 kg/m2   Laboratory Data:  PSA History: 0.66 ng/mL on 05/14/2014    Pertinent Imaging: Results for KOSISOCHUKWU, BURNINGHAM (MRN [id number]) as of 06/16/2015 08:52  Ref. Range 06/16/2015 08:35  Scan Result Unknown 0    Assessment & Plan:    1. BPH (benign prostatic hyperplasia) with LUTS:   Patient currently on Avodart for his BPH with LUTS. He is now experiencing nocturia x 2 with the addition of Toviaz 8 mg. IPSS today was 4/2. Last visit was 13/2. PVR is 0 mL. He will continue the Avodart. A refill was not needed at this time.   - PSA  2. Nocturia:   He did find the Toviaz 8 mg  helpful in controlling his nocturia. It reduced it from 4 times nightly to 2 times nightly.  I reemphasized to the patient that sleep apnea is a contributing factor for nocturia.  He is still consuming diet [employer]. I have suggested that the patient avoid  caffeine starting at noon. He may also benefit from fluid restrictions after 6:00 in the evening and voiding just prior to bedtime. The patient stated his PCP was going to order a sleep study in the future.   He will RTC in 6 months for IPSS score and PVR.   - BLADDER SCAN AMB NON-IMAGING  3. Erectile dysfunction:   Patient did not find the Cialis as effective as he had in the past. I explained to him that untreated sleep apnea can contribute to erectile dysfunction. I also explained how his dietary choices and weight gain of 10 pounds over the last 2 years can also contribute to erectile dysfunction. He'll like to see his erections improved with dietary changes.  His SHIM score today is 17.  He will follow up in 6 months for SHIM.    Return in about 6 months (around 12/15/2015) for IPSS, SHIM and PVR.  Zara Council, [facility] [address] [place], [place] [address] [phone]

## 2015-06-17 ENCOUNTER — Telehealth: Payer: Self-pay

## 2015-06-17 LAB — PSA: Prostate Specific Ag, Serum: 0.7 ng/mL (ref 0.0–4.0)

## 2015-06-17 NOTE — Telephone Encounter (Signed)
Spoke with pt in reference to psa results. Pt voiced understanding.

## 2015-06-17 NOTE — Telephone Encounter (Signed)
-----   Message from Nori Riis, PA-C sent at 06/17/2015  9:09 AM EDT ----- PSA has remained stable.  We will see him on April 20 for lab work and on April 24 for his office visit.

## 2015-09-01 ENCOUNTER — Telehealth: Payer: Self-pay | Admitting: Urology

## 2015-09-01 NOTE — Telephone Encounter (Signed)
Pt called to cancel his upcoming lab appt and 6 month follow up.  He said he has cut back and is not drinking anything after 4:00 p.m. and that seems to have fixed his problem.  Just F.Y.I.

## 2015-09-15 ENCOUNTER — Telehealth: Payer: Self-pay | Admitting: Urology

## 2015-09-15 NOTE — Telephone Encounter (Signed)
He will need to follow up in October 2017 for a yearly visit.  He will need his PSA drawn before that time.

## 2015-09-15 NOTE — Telephone Encounter (Signed)
I called the patient to make his appointments, at first he didn't want to but he changed his mind after I told him he really should keep a check on his PSA. So he is coming in October to see you.  Thanks, Sharyn Lull

## 2015-12-15 ENCOUNTER — Other Ambulatory Visit: Payer: Medicare Other

## 2015-12-19 ENCOUNTER — Ambulatory Visit: Payer: Medicare Other | Admitting: Urology

## 2016-05-31 ENCOUNTER — Other Ambulatory Visit: Payer: Self-pay

## 2016-05-31 DIAGNOSIS — N401 Enlarged prostate with lower urinary tract symptoms: Secondary | ICD-10-CM

## 2016-06-01 ENCOUNTER — Other Ambulatory Visit: Payer: Medicare HMO

## 2016-06-01 DIAGNOSIS — N401 Enlarged prostate with lower urinary tract symptoms: Secondary | ICD-10-CM

## 2016-06-02 LAB — PSA: Prostate Specific Ag, Serum: 0.5 ng/mL (ref 0.0–4.0)

## 2016-06-04 ENCOUNTER — Ambulatory Visit (INDEPENDENT_AMBULATORY_CARE_PROVIDER_SITE_OTHER): Payer: Medicare HMO | Admitting: Urology

## 2016-06-04 ENCOUNTER — Encounter: Payer: Self-pay | Admitting: Urology

## 2016-06-04 VITALS — BP 160/78 | HR 62 | Ht 73.0 in | Wt 265.2 lb

## 2016-06-04 DIAGNOSIS — N138 Other obstructive and reflux uropathy: Secondary | ICD-10-CM | POA: Diagnosis not present

## 2016-06-04 DIAGNOSIS — N529 Male erectile dysfunction, unspecified: Secondary | ICD-10-CM | POA: Diagnosis not present

## 2016-06-04 DIAGNOSIS — N401 Enlarged prostate with lower urinary tract symptoms: Secondary | ICD-10-CM

## 2016-06-04 DIAGNOSIS — R351 Nocturia: Secondary | ICD-10-CM

## 2016-06-04 MED ORDER — SILDENAFIL CITRATE 20 MG PO TABS: ORAL_TABLET | ORAL | 3 refills | Status: DC

## 2016-06-04 MED ORDER — TROSPIUM CHLORIDE ER 60 MG PO CP24: 1.0000 | ORAL_CAPSULE | Freq: Every day | ORAL | 4 refills | Status: DC

## 2016-06-04 NOTE — Progress Notes (Signed)
06/04/2016 9:42 AM   Arvilla Market 1945-11-29 [id number]  Referring provider: [name], MD [facility] [place], [place] [address]  Chief Complaint  Patient presents with  . Benign Prostatic Hypertrophy    Follow up    HPI: Patient is a 70 year old white male who presents to [place] for 6 month follow-up for BPH with LUTS, nocturia and ED.    BPH WITH LUTS His IPSS score today is 12, which is moderate lower urinary tract symptomatology. He is mixed with his quality life due to his urinary symptoms.  His previous IPSS score was 4/2.  His previous PVR is 0 mL.  His major complaint today is frequency, urgency and nocturia x 2.   He has had these symptoms for several years.  He had to discontinue the Toviaz due to cost. He denies any dysuria, hematuria or suprapubic pain.  He currently taking dutasteride 0.5 mg daily.  He could not tolerate alpha-blockers due to dizziness.  He also denies any recent fevers, chills, nausea or vomiting.  He does not have a family history of PCa.      IPSS    Row Name 06/04/16 0900         International Prostate Symptom Score   How often have you had the sensation of not emptying your bladder? Less than 1 in 5     How often have you had to urinate less than every two hours? Less than half the time     How often have you found you stopped and started again several times when you urinated? Less than half the time     How often have you found it difficult to postpone urination? More than half the time     How often have you had a weak urinary stream? Less than 1 in 5 times     How often have you had to strain to start urination? Not at All     How many times did you typically get up at night to urinate? 2 Times     Total IPSS Score 12       Quality of Life due to urinary symptoms   If you were to spend the rest of your life with your urinary condition just the way it is now how would you feel about that? Mixed         Score:  1-7 Mild 8-19 Moderate 20-35 Severe   Nocturia Patient has found a reduction in his nocturia from restricting fluids before bedtime.  He does not feel a sleep study is necessary at this time.    Erectile dysfunction His SHIM score is 7, which is moderate ED.   His previous SHIM score was 17.  He has been having difficulty with erections for several months.   His major complaint is achieving.  His libido is preserved.   His risk factors for ED are sleep apnea, hyperlipidemia, HTN and BPH.  He denies any painful erections or curvatures with his erections.   He has tried Cialis in the past, he did not find them effective.  He states Viagra worked better.       SHIM    Row Name 06/04/16 0906         SHIM: Over the last 6 months:   How do you rate your confidence that you could get and keep an erection? Very Low     When you had erections with sexual stimulation, how often  were your erections hard enough for penetration (entering your partner)? Almost Never or Never     During sexual intercourse, how often were you able to maintain your erection after you had penetrated (entered) your partner? Extremely Difficult     During sexual intercourse, how difficult was it to maintain your erection to completion of intercourse? Very Difficult     When you attempted sexual intercourse, how often was it satisfactory for you? Very Difficult       SHIM Total Score   SHIM 7        Score: 1-7 Severe ED 8-11 Moderate ED 12-16 Mild-Moderate ED 17-21 Mild ED 22-25 No ED   PMH: Past Medical History:  Diagnosis Date  . Arthritis   . BPH associated with nocturia   . ED (erectile dysfunction)   . Erectile dysfunction   . Hyperlipemia   . Hypertension   . Male hypogonadism   . Sleep apnea   . Urinary frequency   . Urinary urgency     Surgical History: Past Surgical History:  Procedure Laterality Date  . EYE SURGERY Bilateral 2002   laser  . INGUINAL HERNIA REPAIR Left    . KNEE SURGERY Right 1995    Home Medications:    Medication List       Accurate as of 06/04/16  9:42 AM. Always use your most recent med list.          amLODipine 10 MG tablet Commonly known as:  NORVASC   aspirin 81 MG tablet Take 81 mg by mouth daily.   cetirizine 10 MG tablet Commonly known as:  ZYRTEC Take 10 mg by mouth daily.   dutasteride 0.5 MG capsule Commonly known as:  AVODART   EPIPEN 2-PAK 0.3 mg/0.3 mL Soaj injection Generic drug:  EPINEPHrine   fesoterodine 8 MG Tb24 tablet Commonly known as:  TOVIAZ Take 1 tablet (8 mg total) by mouth daily.   GLUCOSAMINE 1500 COMPLEX PO Take by mouth.   pilocarpine 5 MG tablet Commonly known as:  SALAGEN   PRESERVISION AREDS 2 PO Take by mouth.   sildenafil 20 MG tablet Commonly known as:  REVATIO Take 3 to 5 tablets two hours before intercouse on an empty stomach.  Do not take with nitrates.   simvastatin 20 MG tablet Commonly known as:  ZOCOR   tadalafil 20 MG tablet Commonly known as:  CIALIS Take 1 tablet (20 mg total) by mouth daily as needed for erectile dysfunction.   triamterene-hydrochlorothiazide 37.5-25 MG capsule Commonly known as:  DYAZIDE   Trospium Chloride 60 MG Cp24 Take 1 capsule (60 mg total) by mouth daily.       Allergies: No Known Allergies  Family History: Family History  Problem Relation Age of Onset  . Prostate cancer Neg Hx   . Bladder Cancer Neg Hx   . Kidney cancer Neg Hx   . Hypertension      Social History:  reports that he has quit smoking. He has never used smokeless tobacco. He reports that he does not drink alcohol or use drugs.  ROS: UROLOGY Frequent Urination?: Yes Hard to postpone urination?: Yes Burning/pain with urination?: No Get up at night to urinate?: No Leakage of urine?: No Urine stream starts and stops?: No Trouble starting stream?: No Do you have to strain to urinate?: No Blood in urine?: No Urinary tract infection?: No Sexually  transmitted disease?: No Injury to kidneys or bladder?: No Painful intercourse?: No Weak stream?: No Erection problems?: Yes  Penile pain?: No  Gastrointestinal Nausea?: No Vomiting?: No Indigestion/heartburn?: No Diarrhea?: No Constipation?: No  Constitutional Fever: No Night sweats?: Yes Weight loss?: No Fatigue?: No  Skin Skin rash/lesions?: No Itching?: No  Eyes Blurred vision?: No Double vision?: No  Ears/Nose/Throat Sore throat?: No Sinus problems?: No  Hematologic/Lymphatic Swollen glands?: No Easy bruising?: No  Cardiovascular Leg swelling?: No Chest pain?: No  Respiratory Cough?: No Shortness of breath?: No  Endocrine Excessive thirst?: No  Musculoskeletal Back pain?: No Joint pain?: No  Neurological Headaches?: No Dizziness?: No  Psychologic Depression?: No Anxiety?: No  Physical Exam: BP (!) 160/78 (BP Location: Left Arm, Patient Position: Sitting, Cuff Size: Large)   Pulse 62   Ht 6\' 1"  (1.854 m)   Wt 265 lb 3.2 oz (120.3 kg)   BMI 34.99 kg/m   Constitutional: Well nourished. Alert and oriented, No acute distress. HEENT: [place] AT, moist mucus membranes. Trachea midline, no masses. Cardiovascular: No clubbing, cyanosis, or edema. Respiratory: Normal respiratory effort, no increased work of breathing. GI: Abdomen is soft, non tender, non distended, no abdominal masses. Liver and spleen not palpable.  No hernias appreciated.  Stool sample for occult testing is not indicated.   GU: No CVA tenderness.  No bladder fullness or masses.  Patient with circumcised phallus.   Urethral meatus is patent.  No penile discharge. No penile lesions or rashes. Scrotum without lesions, cysts, rashes and/or edema.  Testicles are located scrotally bilaterally. No masses are appreciated in the testicles. Left and right epididymis are normal. Rectal: Patient with  normal sphincter tone. Anus and perineum without scarring or rashes. No rectal masses are  appreciated. Prostate is approximately 55 grams, no nodules are appreciated. Seminal vesicles are normal. Skin: No rashes, bruises or suspicious lesions. Lymph: No cervical or inguinal adenopathy. Neurologic: Grossly intact, no focal deficits, moving all 4 extremities. Psychiatric: Normal mood and affect.  Laboratory Data:  PSA History: 0.66 ng/mL on 05/14/2014   0.5 ng/mL on 06/01/2016   Assessment & Plan:    1. BPH with LUTS  - IPSS score is 12/3, it is worsening  - Continue conservative management, avoiding bladder irritants and timed voiding's  - Initiate trospium ER 60 mg daily, prescription sent to the pharmacy  - Continue dutasteride 0.5 mg daily  - RTC in 6 weeks for IPSS and PVR   2. Nocturia  - patient has found benefit with restricting fluids prior to bedtime  - he does not feel a sleep study is necessary at this time  - He will RTC in 6 weeks for IPSS score and PVR.   - BLADDER SCAN AMB NON-IMAGING  3. Erectile dysfunction:   SHIM score 7.  Previous SHIM score was 17.  Patient did not find the Cialis as effective as he had in the past. I explained to him that untreated sleep apnea can contribute to erectile dysfunction.  He would like to try sildenafil 20 mg, 3 to 5 tablets for intercourse.      Return in about 6 weeks (around 07/16/2016) for IPSS and PVR.  Zara Council, [facility] [address], [place], [place] [address] [phone]

## 2016-07-16 ENCOUNTER — Ambulatory Visit: Payer: Medicare HMO | Admitting: Urology

## 2016-08-29 ENCOUNTER — Ambulatory Visit: Payer: Medicare HMO | Admitting: Urology

## 2016-08-29 ENCOUNTER — Telehealth: Payer: Self-pay | Admitting: Urology

## 2016-08-29 ENCOUNTER — Encounter: Payer: Self-pay | Admitting: Urology

## 2016-08-29 VITALS — BP 169/81 | HR 55 | Ht 73.0 in | Wt 269.3 lb

## 2016-08-29 DIAGNOSIS — N401 Enlarged prostate with lower urinary tract symptoms: Secondary | ICD-10-CM

## 2016-08-29 DIAGNOSIS — N138 Other obstructive and reflux uropathy: Secondary | ICD-10-CM | POA: Diagnosis not present

## 2016-08-29 DIAGNOSIS — N400 Enlarged prostate without lower urinary tract symptoms: Secondary | ICD-10-CM

## 2016-08-29 DIAGNOSIS — R351 Nocturia: Secondary | ICD-10-CM | POA: Diagnosis not present

## 2016-08-29 DIAGNOSIS — N529 Male erectile dysfunction, unspecified: Secondary | ICD-10-CM

## 2016-08-29 LAB — BLADDER SCAN AMB NON-IMAGING: Scan Result: 52

## 2016-08-29 MED ORDER — DUTASTERIDE 0.5 MG PO CAPS: 0.5000 mg | ORAL_CAPSULE | Freq: Every day | ORAL | 3 refills | Status: DC

## 2016-08-29 MED ORDER — FESOTERODINE FUMARATE ER 8 MG PO TB24: 8.0000 mg | ORAL_TABLET | Freq: Every day | ORAL | 3 refills | Status: DC

## 2016-08-29 NOTE — Telephone Encounter (Signed)
Medication called into [facility] pharmacy.

## 2016-08-29 NOTE — Progress Notes (Signed)
08/29/2016 8:45 AM   Tyrone Luna 1946-08-10 [id number]  Referring provider: [name], MD [facility] [place], [place] [address]  Chief Complaint  Patient presents with  . Benign Prostatic Hypertrophy    6 week follow up  . Nocturia    HPI: Patient is a 71 year old Caucasian male who presents to [place] for 6 week follow-up after a trial of trospium for BPH with LU TS and  nocturia and sildenafil 20 mg for ED.    BPH WITH LUTS His IPSS score today is 8, which is moderate lower urinary tract symptomatology. He is mixed with his quality life due to his urinary symptoms.  His PVR is 52 mL.  His previous IPSS score was 12/3.  His previous PVR is 0 mL.  His major complaint today is frequency, urgency and nocturia x 2.   He has had these symptoms for several years.  He had to discontinue the Toviaz due to cost.  He was given a trial of tropsium, but he had to discontinue the medication due to dry mouth.  He denies any dysuria, hematuria or suprapubic pain.  He currently taking dutasteride 0.5 mg daily.  He could not tolerate alpha-blockers due to dizziness.  He also denies any recent fevers, chills, nausea or vomiting.  He does not have a family history of PCa.  His insurance has placed the [place] back on their formulary, so he would like to go back on that medication.        IPSS    Row Name 08/29/16 0800         International Prostate Symptom Score   How often have you had the sensation of not emptying your bladder? Not at All     How often have you had to urinate less than every two hours? Less than half the time     How often have you found you stopped and started again several times when you urinated? Not at All     How often have you found it difficult to postpone urination? More than half the time     How often have you had a weak urinary stream? Not at All     How often have you had to strain to start urination? Not at All     How many times  did you typically get up at night to urinate? 2 Times     Total IPSS Score 8       Quality of Life due to urinary symptoms   If you were to spend the rest of your life with your urinary condition just the way it is now how would you feel about that? Mixed        Score:  1-7 Mild 8-19 Moderate 20-35 Severe   Nocturia Patient has found a reduction in his nocturia from restricting fluids before bedtime.  He does not feel a sleep study is necessary at this time.    Erectile dysfunction His SHIM score is 5, which is severe ED.   His previous SHIM score was 7.  He has been having difficulty with erections for several months.   His major complaint is achieving.  His libido is preserved.   His risk factors for ED are sleep apnea, hyperlipidemia, HTN and BPH.  He denies any painful erections or curvatures with his erections.   He has tried Cialis in the past, he did not find them effective.  He states Viagra worked better.  He has been taking sildenafil 20 mg, but he has not found it effective.        SHIM    Row Name 08/29/16 0834         SHIM: Over the last 6 months:   How do you rate your confidence that you could get and keep an erection? Very Low     When you had erections with sexual stimulation, how often were your erections hard enough for penetration (entering your partner)? Almost Never or Never     During sexual intercourse, how often were you able to maintain your erection after you had penetrated (entered) your partner? Extremely Difficult     During sexual intercourse, how difficult was it to maintain your erection to completion of intercourse? Extremely Difficult     When you attempted sexual intercourse, how often was it satisfactory for you? Extremely Difficult       SHIM Total Score   SHIM 5        Score: 1-7 Severe ED 8-11 Moderate ED 12-16 Mild-Moderate ED 17-21 Mild ED 22-25 No ED   PMH: Past Medical History:  Diagnosis Date  . Arthritis   . BPH  associated with nocturia   . ED (erectile dysfunction)   . Erectile dysfunction   . Hyperlipemia   . Hypertension   . Male hypogonadism   . Sleep apnea   . Urinary frequency   . Urinary urgency     Surgical History: Past Surgical History:  Procedure Laterality Date  . EYE SURGERY Bilateral 2002   laser  . INGUINAL HERNIA REPAIR Left   . KNEE SURGERY Right 1995    Home Medications:  Allergies as of 08/29/2016   No Known Allergies     Medication List       Accurate as of 08/29/16  8:45 AM. Always use your most recent med list.          amLODipine 10 MG tablet Commonly known as:  NORVASC   aspirin 81 MG tablet Take 81 mg by mouth daily.   cetirizine 10 MG tablet Commonly known as:  ZYRTEC Take 10 mg by mouth daily.   dutasteride 0.5 MG capsule Commonly known as:  AVODART Take 1 capsule (0.5 mg total) by mouth daily.   EPIPEN 2-PAK 0.3 mg/0.3 mL Soaj injection Generic drug:  EPINEPHrine   fesoterodine 8 MG Tb24 tablet Commonly known as:  TOVIAZ Take 1 tablet (8 mg total) by mouth daily.   GLUCOSAMINE 1500 COMPLEX PO Take by mouth.   loratadine-pseudoephedrine 10-240 MG 24 hr tablet Commonly known as:  CLARITIN-D 24-hour Take 1 tablet by mouth daily.   mometasone 0.1 % cream Commonly known as:  ELOCON Apply topically.   pilocarpine 5 MG tablet Commonly known as:  SALAGEN   PRESERVISION AREDS 2 PO Take by mouth.   sildenafil 20 MG tablet Commonly known as:  REVATIO Take 3 to 5 tablets two hours before intercouse on an empty stomach.  Do not take with nitrates.   simvastatin 20 MG tablet Commonly known as:  ZOCOR   tadalafil 20 MG tablet Commonly known as:  CIALIS Take 1 tablet (20 mg total) by mouth daily as needed for erectile dysfunction.   triamterene-hydrochlorothiazide 37.5-25 MG capsule Commonly known as:  DYAZIDE   Trospium Chloride 60 MG Cp24 Take 1 capsule (60 mg total) by mouth daily.       Allergies: No Known  Allergies  Family History: Family History  Problem Relation Age of  Onset  . Hypertension    . Prostate cancer Neg Hx   . Bladder Cancer Neg Hx   . Kidney cancer Neg Hx     Social History:  reports that he has quit smoking. He has never used smokeless tobacco. He reports that he does not drink alcohol or use drugs.  ROS: UROLOGY Frequent Urination?: Yes Hard to postpone urination?: Yes Burning/pain with urination?: No Get up at night to urinate?: Yes Leakage of urine?: No Urine stream starts and stops?: No Trouble starting stream?: No Do you have to strain to urinate?: No Blood in urine?: No Urinary tract infection?: No Sexually transmitted disease?: No Injury to kidneys or bladder?: No Painful intercourse?: No Weak stream?: No Erection problems?: Yes Penile pain?: No  Gastrointestinal Nausea?: No Vomiting?: No Indigestion/heartburn?: No Diarrhea?: No Constipation?: No  Constitutional Fever: No Night sweats?: No Weight loss?: No Fatigue?: No  Skin Skin rash/lesions?: No Itching?: No  Eyes Blurred vision?: No Double vision?: No  Ears/Nose/Throat Sore throat?: No Sinus problems?: No  Hematologic/Lymphatic Swollen glands?: No Easy bruising?: No  Cardiovascular Leg swelling?: No Chest pain?: No  Respiratory Cough?: No Shortness of breath?: No  Endocrine Excessive thirst?: No  Musculoskeletal Back pain?: No Joint pain?: No  Neurological Headaches?: No Dizziness?: No  Psychologic Depression?: No Anxiety?: No  Physical Exam: BP (!) 169/81   Pulse (!) 55   Ht 6\' 1"  (1.854 m)   Wt 269 lb 4.8 oz (122.2 kg)   BMI 35.53 kg/m   Constitutional: Well nourished. Alert and oriented, No acute distress. HEENT: [place] AT, moist mucus membranes. Trachea midline, no masses. Cardiovascular: No clubbing, cyanosis, or edema. Respiratory: Normal respiratory effort, no increased work of breathing. GI: Abdomen is soft, non tender, non distended, no  abdominal masses. Liver and spleen not palpable.  No hernias appreciated.  Stool sample for occult testing is not indicated.   GU: No CVA tenderness.  No bladder fullness or masses.  Patient with circumcised phallus.   Urethral meatus is patent.  No penile discharge. No penile lesions or rashes. Scrotum without lesions, cysts, rashes and/or edema.  Testicles are located scrotally bilaterally. No masses are appreciated in the testicles. Left and right epididymis are normal. Rectal: Patient with  normal sphincter tone. Anus and perineum without scarring or rashes. No rectal masses are appreciated. Prostate is approximately 55 grams, no nodules are appreciated. Seminal vesicles are normal. Skin: No rashes, bruises or suspicious lesions. Lymph: No cervical or inguinal adenopathy. Neurologic: Grossly intact, no focal deficits, moving all 4 extremities. Psychiatric: Normal mood and affect.  Laboratory Data:  PSA History: 0.66 ng/mL on 05/14/2014   0.5 ng/mL on 06/01/2016   Assessment & Plan:    1. BPH with LUTS  - IPSS score is 8/3, it is improving  - Continue conservative management, avoiding bladder irritants and timed voiding's  - Discontinued trospium ER 60 mg daily due to dry mouth  - restart the Toviaz 8 mg daily; refills given  - Continue dutasteride 0.5 mg daily; refills given  - RTC in 6 weeks for IPSS and PVR   2. Nocturia  - patient has found benefit with restricting fluids prior to bedtime  - he does not feel a sleep study is necessary at this time  - He will RTC in 6 weeks for IPSS score and PVR.   - BLADDER SCAN AMB NON-IMAGING  3. Erectile dysfunction:   SHIM score 5.  Previous SHIM score was 7.  Patient did not  find the Cialis as effective as he had in the past. I explained to him that untreated sleep apnea can contribute to erectile dysfunction.  The sildenafil 20 mg, 3 to 5 tablets for intercourse was not effective.   He would like to try Trimix at  this time.     Return in about 6 months (around 02/26/2017) for IPSS, SHIM, PSA and exam.  Zara Council, [facility]  [facility] [address], [place], [place] [address] [phone]

## 2016-08-29 NOTE — Telephone Encounter (Signed)
Would you call in a prescription for Trimix for this patient to [facility]?

## 2016-09-04 ENCOUNTER — Ambulatory Visit: Payer: Medicare HMO | Admitting: Urology

## 2016-09-06 ENCOUNTER — Encounter: Payer: Self-pay | Admitting: Urology

## 2016-09-06 ENCOUNTER — Ambulatory Visit: Payer: Medicare HMO | Admitting: Urology

## 2016-09-06 VITALS — BP 178/94 | HR 66 | Ht 72.0 in | Wt 265.9 lb

## 2016-09-06 DIAGNOSIS — N529 Male erectile dysfunction, unspecified: Secondary | ICD-10-CM | POA: Diagnosis not present

## 2016-09-06 NOTE — Progress Notes (Signed)
09/06/2016 9:46 AM   Arvilla Market 1946/03/07 [id number]  Referring provider: [name], MD [facility] [place], [place] [address]  Chief Complaint  Patient presents with  . Erectile Dysfunction    HPI: Patient is a 71 year old Caucasian male who presents to [place] for a test dose injection of Trimix.     Erectile dysfunction His SHIM score is 5, which is severe ED.   His previous SHIM score was 7.  He has been having difficulty with erections for several months.   His major complaint is achieving.  His libido is preserved.   His risk factors for ED are sleep apnea, hyperlipidemia, HTN and BPH.  He denies any painful erections or curvatures with his erections.   He has tried Cialis in the past, he did not find them effective.  He states Viagra worked better.  He has been taking sildenafil 20 mg, but he has not found it effective.  He has chosen to have a test dose of Trimix.     SHIM    Row Name 08/29/16 0834         SHIM: Over the last 6 months:   How do you rate your confidence that you could get and keep an erection? Very Low     When you had erections with sexual stimulation, how often were your erections hard enough for penetration (entering your partner)? Almost Never or Never     During sexual intercourse, how often were you able to maintain your erection after you had penetrated (entered) your partner? Extremely Difficult     During sexual intercourse, how difficult was it to maintain your erection to completion of intercourse? Extremely Difficult     When you attempted sexual intercourse, how often was it satisfactory for you? Extremely Difficult       SHIM Total Score   SHIM 5        Score: 1-7 Severe ED 8-11 Moderate ED 12-16 Mild-Moderate ED 17-21 Mild ED 22-25 No ED   PMH: Past Medical History:  Diagnosis Date  . Arthritis   . BPH associated with nocturia   . ED (erectile dysfunction)   . Erectile dysfunction   .  Hyperlipemia   . Hypertension   . Male hypogonadism   . Sleep apnea   . Urinary frequency   . Urinary urgency     Surgical History: Past Surgical History:  Procedure Laterality Date  . EYE SURGERY Bilateral 2002   laser  . INGUINAL HERNIA REPAIR Left   . KNEE SURGERY Right 1995    Home Medications:  Allergies as of 09/06/2016   No Known Allergies     Medication List       Accurate as of 09/06/16  9:46 AM. Always use your most recent med list.          amLODipine 10 MG tablet Commonly known as:  NORVASC   aspirin 81 MG tablet Take 81 mg by mouth daily.   cetirizine 10 MG tablet Commonly known as:  ZYRTEC Take 10 mg by mouth daily.   dutasteride 0.5 MG capsule Commonly known as:  AVODART Take 1 capsule (0.5 mg total) by mouth daily.   EPIPEN 2-PAK 0.3 mg/0.3 mL Soaj injection Generic drug:  EPINEPHrine   fesoterodine 8 MG Tb24 tablet Commonly known as:  TOVIAZ Take 1 tablet (8 mg total) by mouth daily.   fluticasone 50 MCG/ACT nasal spray Commonly known as:  FLONASE   GLUCOSAMINE  1500 COMPLEX PO Take by mouth.   loratadine-pseudoephedrine 10-240 MG 24 hr tablet Commonly known as:  CLARITIN-D 24-hour Take 1 tablet by mouth daily.   mometasone 0.1 % cream Commonly known as:  ELOCON Apply topically.   pilocarpine 5 MG tablet Commonly known as:  SALAGEN   PRESERVISION AREDS 2 PO Take by mouth.   sildenafil 20 MG tablet Commonly known as:  REVATIO Take 3 to 5 tablets two hours before intercouse on an empty stomach.  Do not take with nitrates.   simvastatin 20 MG tablet Commonly known as:  ZOCOR   tadalafil 20 MG tablet Commonly known as:  CIALIS Take 1 tablet (20 mg total) by mouth daily as needed for erectile dysfunction.   triamterene-hydrochlorothiazide 37.5-25 MG capsule Commonly known as:  DYAZIDE   Trospium Chloride 60 MG Cp24 Take 1 capsule (60 mg total) by mouth daily.       Allergies: No Known Allergies  Family  History: Family History  Problem Relation Age of Onset  . Hypertension    . Prostate cancer Neg Hx   . Bladder Cancer Neg Hx   . Kidney cancer Neg Hx     Social History:  reports that he has quit smoking. He has never used smokeless tobacco. He reports that he does not drink alcohol or use drugs.  ROS: UROLOGY Frequent Urination?: Yes Hard to postpone urination?: No Burning/pain with urination?: No Get up at night to urinate?: Yes Leakage of urine?: No Urine stream starts and stops?: No Trouble starting stream?: No Do you have to strain to urinate?: No Blood in urine?: No Urinary tract infection?: No Sexually transmitted disease?: No Injury to kidneys or bladder?: No Painful intercourse?: No Weak stream?: No Erection problems?: Yes Penile pain?: No  Gastrointestinal Nausea?: No Vomiting?: No Indigestion/heartburn?: No Diarrhea?: No Constipation?: No  Constitutional Fever: No Night sweats?: No Weight loss?: No Fatigue?: No  Skin Skin rash/lesions?: No Itching?: No  Eyes Blurred vision?: No Double vision?: No  Ears/Nose/Throat Sore throat?: No Sinus problems?: No  Hematologic/Lymphatic Swollen glands?: No Easy bruising?: No  Cardiovascular Leg swelling?: No Chest pain?: No  Respiratory Cough?: No Shortness of breath?: No  Endocrine Excessive thirst?: No  Musculoskeletal Back pain?: No Joint pain?: No  Neurological Headaches?: No Dizziness?: No  Psychologic Depression?: No Anxiety?: No  Physical Exam: BP (!) 178/94   Pulse 66   Ht 6' (1.829 m)   Wt 265 lb 14.4 oz (120.6 kg)   BMI 36.06 kg/m   Constitutional: Well nourished. Alert and oriented, No acute distress. HEENT:  AT, moist mucus membranes. Trachea midline, no masses. Cardiovascular: No clubbing, cyanosis, or edema. Respiratory: Normal respiratory effort, no increased work of breathing. GI: Abdomen is soft, non tender, non distended, no abdominal masses. Liver and  spleen not palpable.  No hernias appreciated.  Stool sample for occult testing is not indicated.   GU: No CVA tenderness.  No bladder fullness or masses.  Patient with circumcised phallus.   Urethral meatus is patent.  No penile discharge. No penile lesions or rashes. Scrotum without lesions, cysts, rashes and/or edema.  Testicles are located scrotally bilaterally. No masses are appreciated in the testicles. Left and right epididymis are normal. Rectal: Deferred.  Skin: No rashes, bruises or suspicious lesions. Lymph: No cervical or inguinal adenopathy. Neurologic: Grossly intact, no focal deficits, moving all 4 extremities. Psychiatric: Normal mood and affect.  Laboratory Data:  PSA History: 0.66 ng/mL on 05/14/2014   0.5 ng/mL on 06/01/2016  Procedure Patient's left corpus cavernosum is identified.  An area near the base of the penis is cleansed with rubbing alcohol.  Careful to avoid the dorsal vein, 5 mcg of Trimix is injected at a 90 degree angle into the left corpus cavernosum near the base of the penis.  Patient experienced a semi firm erection in 15 minutes.     Assessment & Plan:    1. Erectile dysfunction  - SHIM score 5, it is worsening  - PDE5-inhibitors are not longer effective  - Trimix injection was given today, he achieved a semi rigid erection, he will schedule another appointment next week for another injection and teaching, we may increase the amount with his next injection   Return for next week for Trimix teaching.  Zara Council, [facility] [address] [place], [place] [address] [phone]

## 2017-01-24 ENCOUNTER — Other Ambulatory Visit: Payer: Self-pay

## 2017-01-24 DIAGNOSIS — N401 Enlarged prostate with lower urinary tract symptoms: Secondary | ICD-10-CM

## 2017-02-19 ENCOUNTER — Other Ambulatory Visit: Payer: Medicare HMO

## 2017-02-19 DIAGNOSIS — N401 Enlarged prostate with lower urinary tract symptoms: Secondary | ICD-10-CM

## 2017-02-19 NOTE — Progress Notes (Signed)
02/21/2017 9:01 AM   Arvilla Market [birth year] [id number]  Referring provider: [name], MD [facility] [place], [place] [address]  Chief Complaint  Patient presents with  . Erectile Dysfunction    6 month follow up    HPI: Patient is a 71 year old Caucasian male with ED and BPH with LU TS who presents today for a 6 month follow up.    BPH WITH LUTS  (prostate and/or bladder) His IPSS score today is 8, which is moderate lower urinary tract symptomatology.  He is mostly satisfied with his quality life due to his urinary symptoms.  His previous IPSS score was 8/3.  His previous PVR is 52 mL.  His major complaints today are frequency and nocturia.  .  He has had these symptoms for several years.  He denies any dysuria, hematuria or suprapubic pain.   He currently taking dutasteride.    He also denies any recent fevers, chills, nausea or vomiting.  He does not have a family history of PCa.      IPSS    Row Name 02/21/17 0800         International Prostate Symptom Score   How often have you had the sensation of not emptying your bladder? Not at All     How often have you had to urinate less than every two hours? Less than half the time     How often have you found you stopped and started again several times when you urinated? Not at All     How often have you found it difficult to postpone urination? More than half the time     How often have you had a weak urinary stream? Not at All     How often have you had to strain to start urination? Not at All     How many times did you typically get up at night to urinate? 2 Times     Total IPSS Score 8       Quality of Life due to urinary symptoms   If you were to spend the rest of your life with your urinary condition just the way it is now how would you feel about that? Mostly Satisfied        Score:  1-7 Mild 8-19 Moderate 20-35 Severe      Erectile dysfunction His SHIM score is  5, which is severe ED.   His previous SHIM score was 5.  He has been having difficulty with erections for several months.   His major complaint is achieving.  His libido is preserved.   His risk factors for ED are sleep apnea, testosterone deficiency hyperlipidemia, HTN and BPH.  He denies any painful erections or curvatures with his erections.   He has tried Cialis in the past, he did not find them effective.  He states Viagra worked better.  He has been taking sildenafil 20 mg, but he has not found it effective.  He does not want to continue the Trimix as he is uncomfortable injecting himself.  He has tried a vacuum erection device in the past and was not satisfied with the device.       SHIM    Row Name 02/21/17 0834         SHIM: Over the last 6 months:   How do you rate your confidence that you could get and keep an erection? Very Low     When you had erections  with sexual stimulation, how often were your erections hard enough for penetration (entering your partner)? Almost Never or Never     During sexual intercourse, how often were you able to maintain your erection after you had penetrated (entered) your partner? Almost Never or Never     During sexual intercourse, how difficult was it to maintain your erection to completion of intercourse? Extremely Difficult     When you attempted sexual intercourse, how often was it satisfactory for you? Almost Never or Never       SHIM Total Score   SHIM 5        Score: 1-7 Severe ED 8-11 Moderate ED 12-16 Mild-Moderate ED 17-21 Mild ED 22-25 No ED   PMH: Past Medical History:  Diagnosis Date  . Arthritis   . BPH associated with nocturia   . ED (erectile dysfunction)   . Erectile dysfunction   . Hyperlipemia   . Hypertension   . Male hypogonadism   . Sleep apnea   . Urinary frequency   . Urinary urgency     Surgical History: Past Surgical History:  Procedure Laterality Date  . EYE SURGERY Bilateral 2002   laser  . INGUINAL  HERNIA REPAIR Left   . KNEE SURGERY Right 1995    Home Medications:  Allergies as of 02/21/2017   No Known Allergies     Medication List       Accurate as of 02/21/17  9:01 AM. Always use your most recent med list.          amLODipine 10 MG tablet Commonly known as:  NORVASC   aspirin 81 MG tablet Take 81 mg by mouth daily.   augmented betamethasone dipropionate 0.05 % cream Commonly known as:  DIPROLENE-AF Apply topically.   cetirizine 10 MG tablet Commonly known as:  ZYRTEC Take 10 mg by mouth daily.   dutasteride 0.5 MG capsule Commonly known as:  AVODART Take 1 capsule (0.5 mg total) by mouth daily.   EPIPEN 2-PAK 0.3 mg/0.3 mL Soaj injection Generic drug:  EPINEPHrine   fesoterodine 8 MG Tb24 tablet Commonly known as:  TOVIAZ Take 1 tablet (8 mg total) by mouth daily.   fluticasone 50 MCG/ACT nasal spray Commonly known as:  FLONASE   GLUCOSAMINE 1500 COMPLEX PO Take by mouth.   loratadine-pseudoephedrine 10-240 MG 24 hr tablet Commonly known as:  CLARITIN-D 24-hour Take 1 tablet by mouth daily.   mometasone 0.1 % cream Commonly known as:  ELOCON Apply topically.   pilocarpine 5 MG tablet Commonly known as:  SALAGEN   PRESERVISION AREDS 2 PO Take by mouth.   sildenafil 20 MG tablet Commonly known as:  REVATIO Take 3 to 5 tablets two hours before intercouse on an empty stomach.  Do not take with nitrates.   simvastatin 20 MG tablet Commonly known as:  ZOCOR   tadalafil 20 MG tablet Commonly known as:  CIALIS Take 1 tablet (20 mg total) by mouth daily as needed for erectile dysfunction.   triamterene-hydrochlorothiazide 37.5-25 MG capsule Commonly known as:  DYAZIDE   Trospium Chloride 60 MG Cp24 Take 1 capsule (60 mg total) by mouth daily.       Allergies: No Known Allergies  Family History: Family History  Problem Relation Age of Onset  . Hypertension Unknown   . Prostate cancer Neg Hx   . Bladder Cancer Neg Hx   . Kidney  cancer Neg Hx     Social History:  reports that he has quit  smoking. He has never used smokeless tobacco. He reports that he does not drink alcohol or use drugs.  ROS: UROLOGY Frequent Urination?: Yes Hard to postpone urination?: No Burning/pain with urination?: No Get up at night to urinate?: Yes Leakage of urine?: No Urine stream starts and stops?: No Trouble starting stream?: No Do you have to strain to urinate?: No Blood in urine?: No Urinary tract infection?: No Sexually transmitted disease?: No Injury to kidneys or bladder?: No Painful intercourse?: No Weak stream?: No Erection problems?: No Penile pain?: No  Gastrointestinal Nausea?: No Vomiting?: No Indigestion/heartburn?: No Diarrhea?: No Constipation?: No  Constitutional Fever: No Night sweats?: Yes Weight loss?: No Fatigue?: No  Skin Skin rash/lesions?: No Itching?: No  Eyes Blurred vision?: No Double vision?: No  Ears/Nose/Throat Sore throat?: No Sinus problems?: No  Hematologic/Lymphatic Swollen glands?: No Easy bruising?: No  Cardiovascular Leg swelling?: Yes Chest pain?: No  Respiratory Cough?: No Shortness of breath?: No  Endocrine Excessive thirst?: No  Musculoskeletal Back pain?: No Joint pain?: No  Neurological Headaches?: No Dizziness?: No  Psychologic Depression?: No Anxiety?: No  Physical Exam: BP (!) 173/84   Pulse 62   Ht 6\' 1"  (1.854 m)   Wt 265 lb 12.8 oz (120.6 kg)   BMI 35.07 kg/m   Constitutional: Well nourished. Alert and oriented, No acute distress. HEENT: [place] AT, moist mucus membranes. Trachea midline, no masses. Cardiovascular: No clubbing, cyanosis, or edema. Respiratory: Normal respiratory effort, no increased work of breathing. GI: Abdomen is soft, non tender, non distended, no abdominal masses. Liver and spleen not palpable.  No hernias appreciated.  Stool sample for occult testing is not indicated.   GU: No CVA tenderness.  No bladder  fullness or masses.  Patient with circumcised phallus.   Urethral meatus is patent.  No penile discharge. No penile lesions or rashes. Scrotum without lesions, cysts, rashes and/or edema.  Testicles are located scrotally bilaterally. No masses are appreciated in the testicles. Left and right epididymis are normal. Rectal: Deferred.  Skin: No rashes, bruises or suspicious lesions. Lymph: No cervical or inguinal adenopathy. Neurologic: Grossly intact, no focal deficits, moving all 4 extremities. Psychiatric: Normal mood and affect.  Laboratory Data:  PSA History: 0.66 ng/mL on 05/14/2014   0.5 ng/mL on 06/01/2016   0.7 ng/mL on 02/19/2017  Assessment & Plan:    1. Erectile dysfunction  - SHIM score 5, it is stable  - PDE5-inhibitors are not longer effective  - Trimix injections were not effective as he did not like injecting himself  - Vacuum erection device caused bruising - he bought it online and he may not have ordered the correct device for his penis  - given DVD on penile prothesis  2. BPH with LUTS  - IPSS score is 8/2, it is stable  - Continue conservative management, avoiding bladder irritants and timed voiding's  - most bothersome symptoms is/are frequency and nocturia  - Continue dutasteride 0.5 mg daily:refills given  - RTC in 12 months for IPSS, PSA and exam   Return in about 1 year (around 02/21/2018) for IPSS, SHIM,PSA and exam.  Zara Council, [facility]  [facility] [address] [place], [place] [address] [phone]

## 2017-02-20 LAB — PSA: Prostate Specific Ag, Serum: 0.7 ng/mL (ref 0.0–4.0)

## 2017-02-21 ENCOUNTER — Encounter: Payer: Self-pay | Admitting: Urology

## 2017-02-21 ENCOUNTER — Ambulatory Visit: Payer: Medicare HMO | Admitting: Urology

## 2017-02-21 VITALS — BP 173/84 | HR 62 | Ht 73.0 in | Wt 265.8 lb

## 2017-02-21 DIAGNOSIS — N138 Other obstructive and reflux uropathy: Secondary | ICD-10-CM | POA: Diagnosis not present

## 2017-02-21 DIAGNOSIS — N401 Enlarged prostate with lower urinary tract symptoms: Secondary | ICD-10-CM | POA: Diagnosis not present

## 2017-02-21 DIAGNOSIS — N529 Male erectile dysfunction, unspecified: Secondary | ICD-10-CM

## 2017-02-21 MED ORDER — DUTASTERIDE 0.5 MG PO CAPS: 0.5000 mg | ORAL_CAPSULE | Freq: Every day | ORAL | 3 refills | Status: DC

## 2017-11-28 ENCOUNTER — Other Ambulatory Visit: Payer: Self-pay | Admitting: Internal Medicine

## 2017-11-28 DIAGNOSIS — R1310 Dysphagia, unspecified: Secondary | ICD-10-CM

## 2017-12-04 ENCOUNTER — Ambulatory Visit
Admission: RE | Admit: 2017-12-04 | Discharge: 2017-12-04 | Disposition: A | Discharge status: Home / Self Care | Payer: Medicare HMO | Source: Ambulatory Visit | Attending: Internal Medicine | Admitting: Internal Medicine

## 2017-12-04 DIAGNOSIS — K219 Gastro-esophageal reflux disease without esophagitis: Secondary | ICD-10-CM | POA: Diagnosis not present

## 2017-12-04 DIAGNOSIS — R1310 Dysphagia, unspecified: Secondary | ICD-10-CM | POA: Insufficient documentation

## 2017-12-04 IMAGING — RF DG ESOPHAGUS
8 of 10 series · 13 of 24 positions shown · non-contrast
Comparison: None.

CLINICAL DATA: Reflux

EXAM:
ESOPHOGRAM / BARIUM SWALLOW / BARIUM TABLET STUDY
TECHNIQUE: Combined double contrast and single contrast examination performed
using effervescent crystals, thick barium liquid, and thin barium
liquid. The patient was observed with fluoroscopy swallowing a 13 mm
barium sulphate tablet.
FLUOROSCOPY TIME:  Fluoroscopy Time:  0.7 minute
Radiation Exposure Index (if provided by the fluoroscopic device):
7.7 mGy
Number of Acquired Spot Images: 0

[Series 1: cp_standard · 0.51mm/px · 2 of 30 frames shown (1 of 8)]
[frame 1/30]
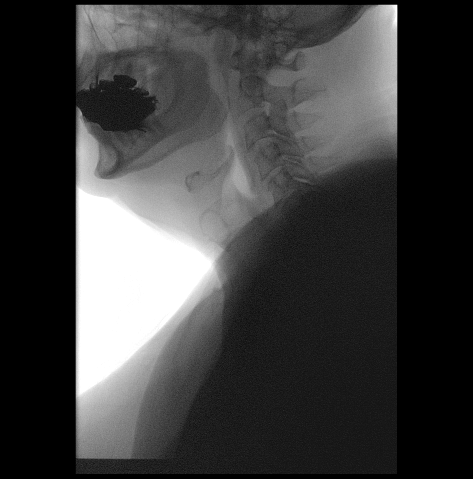
[frame 16/30]
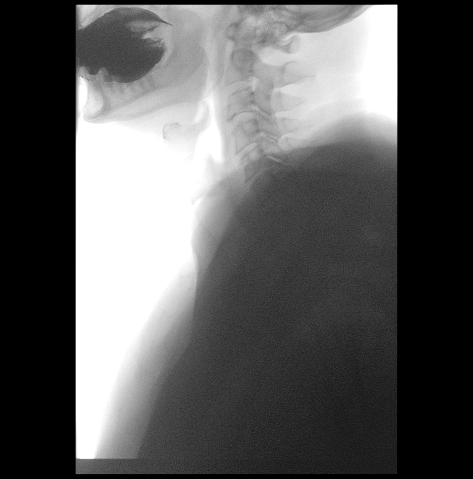

[Series 2: cp_standard · 0.51mm/px · 2 of 51 frames shown (2 of 8)]
[frame 4/51]
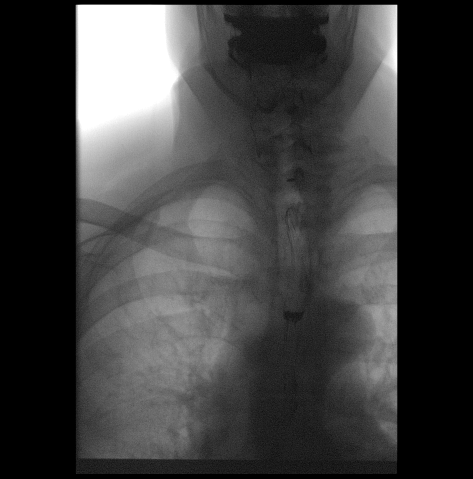
[frame 44/51]
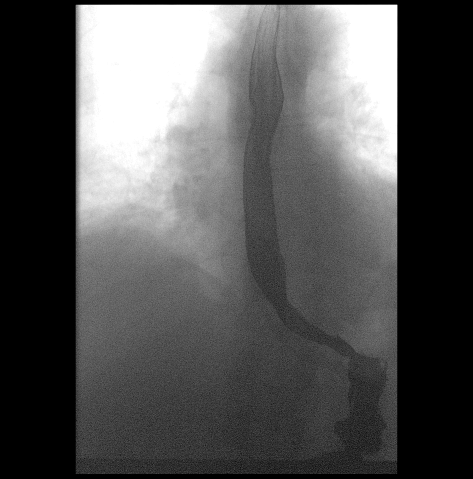

[Series 3: cp_standard · 0.51mm/px · 2 of 20 frames shown (3 of 8)]
[frame 11/20]
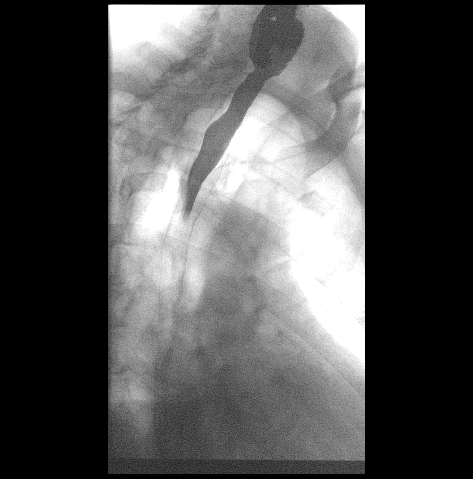
[frame 18/20]
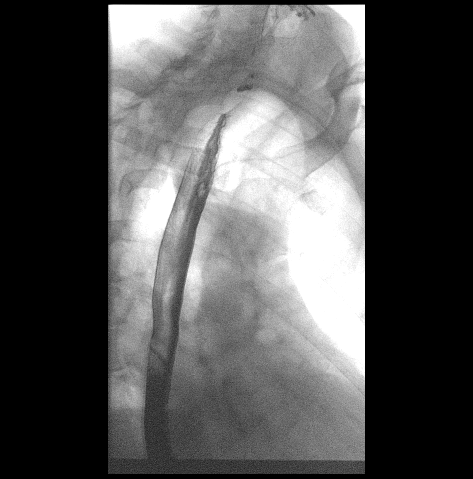

[Series 5: cp_standard · 0.26mm/px · 1 of 1 slices shown (4 of 8)]
[im 1/1]
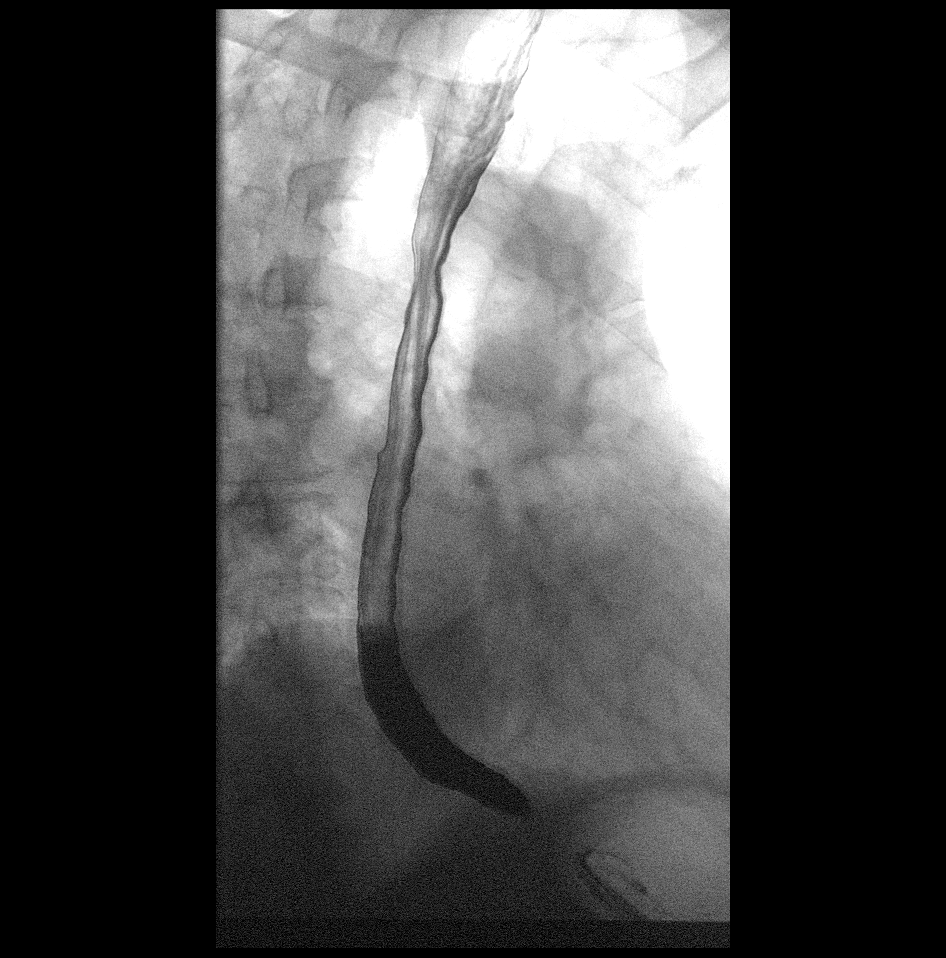

[Series 6: cp_standard · 0.51mm/px · 2 of 27 frames shown (5 of 8)]
[frame 1/27]
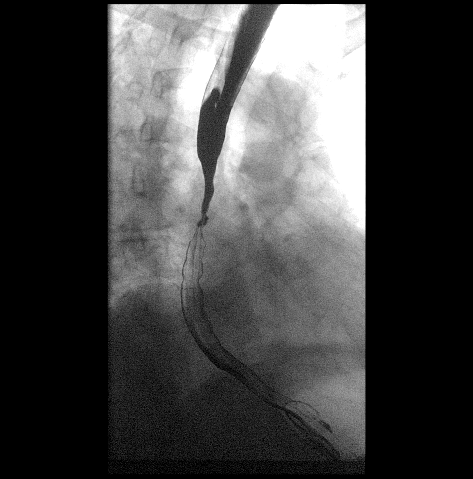
[frame 14/27]
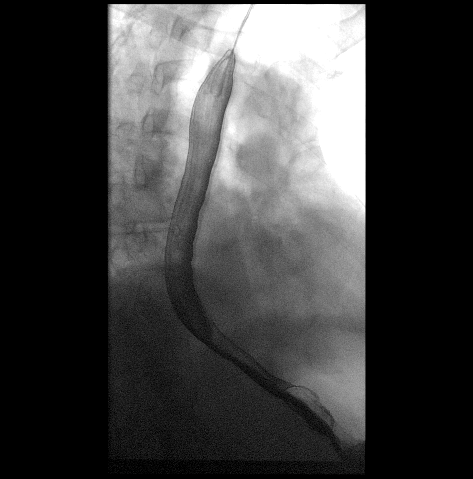

[Series 7: cp_standard · 0.25mm/px · 1 of 1 slices shown (6 of 8)]
[im 1/1]
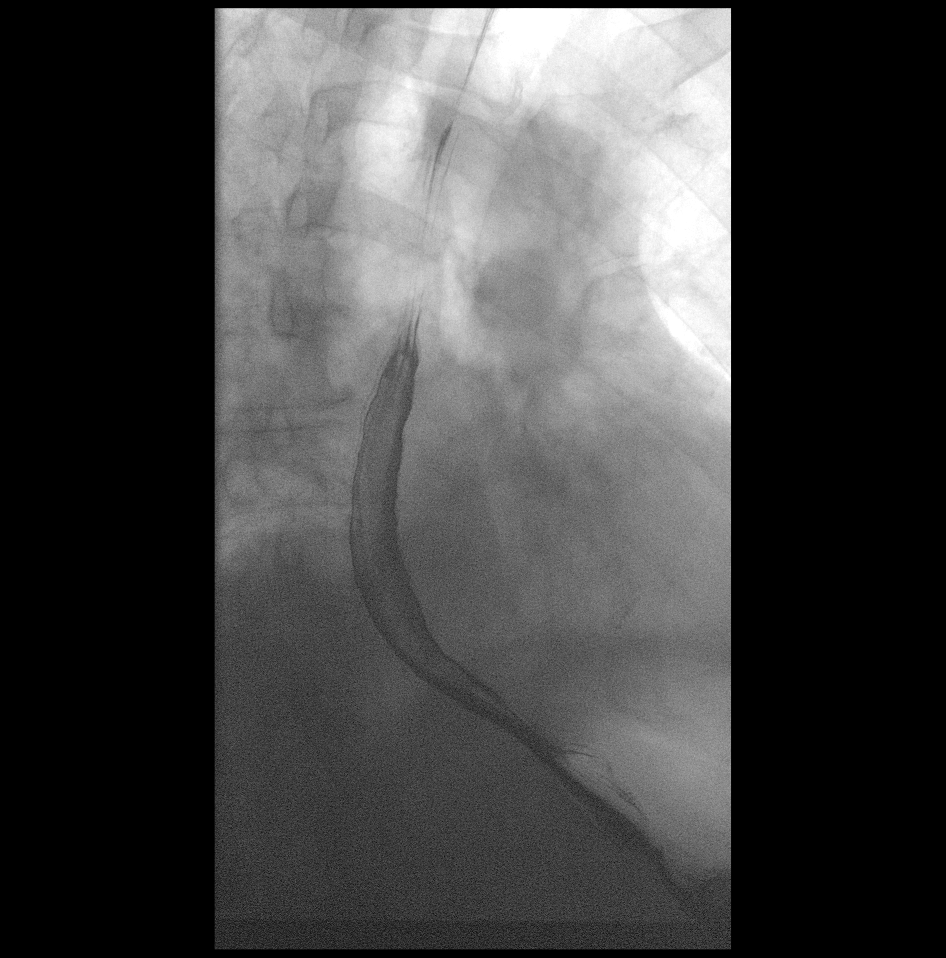

[Series 10: cp_standard · 0.55mm/px · 2 of 48 frames shown (7 of 8)]
[frame 8/48]
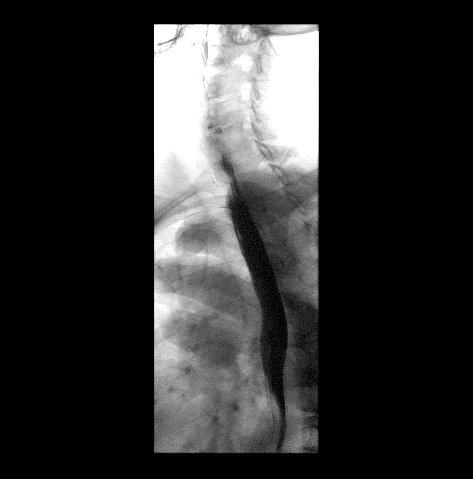
[frame 41/48]
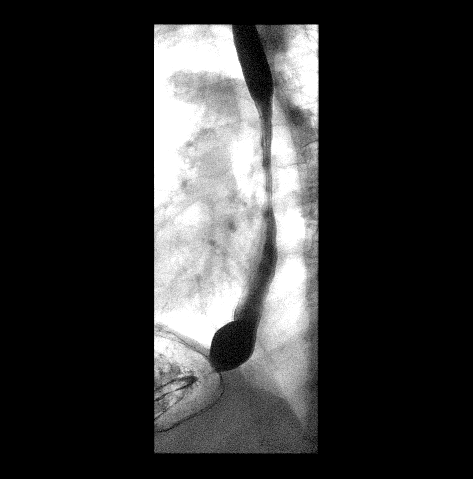

[Series 11: cp_standard · 0.28mm/px · 1 of 1 slices shown (8 of 8)]
[im 1/1]
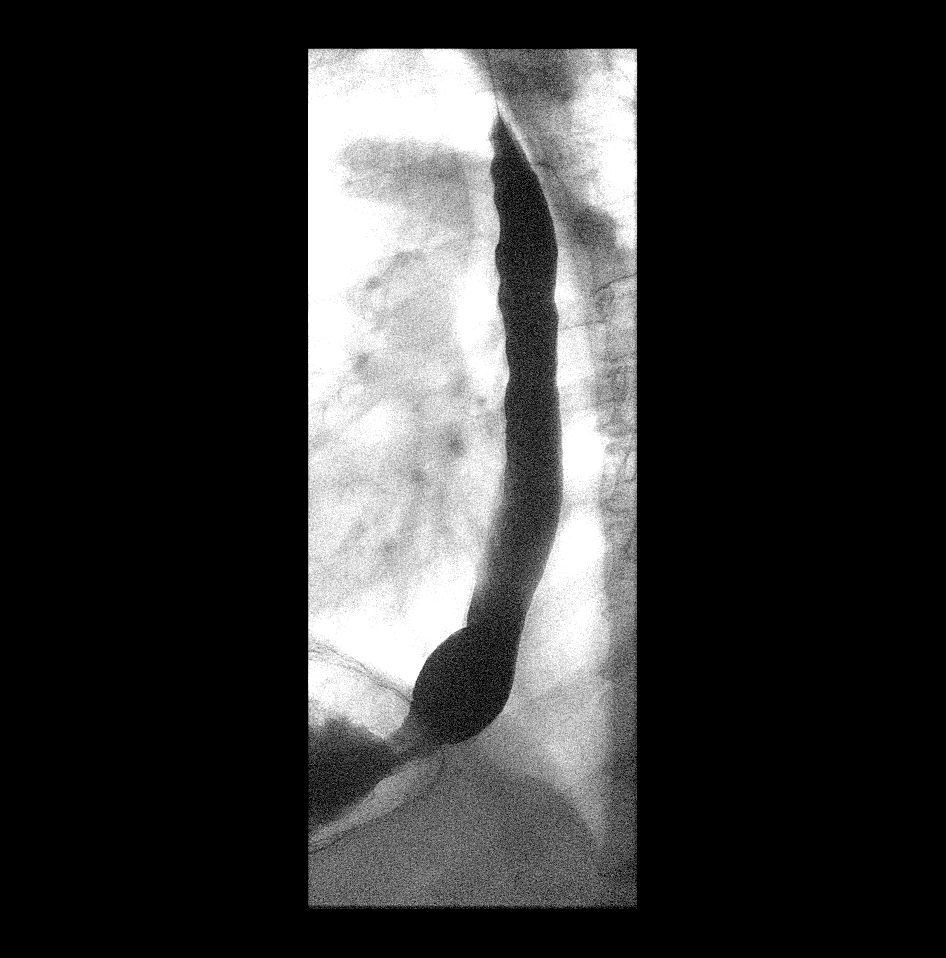

[13 of 24 positions shown; findings below may reference images not displayed]

FINDINGS: There was normal pharyngeal anatomy and motility. Contrast flowed
freely through the esophagus without evidence of stricture or mass.
There was normal esophageal mucosa without evidence of irregularity
or ulceration. Esophageal motility was normal. Mild gastroesophageal
reflux. No definite hiatal hernia was demonstrated.

At the end of the examination a 13 mm barium tablet was administered
which transited through the esophagus and esophagogastric junction
without delay.
IMPRESSION: Mild gastroesophageal reflux.  Otherwise barium swallow.

## 2018-02-17 ENCOUNTER — Other Ambulatory Visit: Payer: Medicare HMO

## 2018-02-17 ENCOUNTER — Other Ambulatory Visit: Payer: Self-pay

## 2018-02-17 DIAGNOSIS — N138 Other obstructive and reflux uropathy: Secondary | ICD-10-CM

## 2018-02-17 DIAGNOSIS — N401 Enlarged prostate with lower urinary tract symptoms: Principal | ICD-10-CM

## 2018-02-18 LAB — PSA: Prostate Specific Ag, Serum: 0.6 ng/mL (ref 0.0–4.0)

## 2018-02-20 ENCOUNTER — Ambulatory Visit: Payer: Medicare HMO | Admitting: Urology

## 2018-03-12 ENCOUNTER — Ambulatory Visit: Payer: Medicare HMO | Admitting: Urology

## 2018-03-16 NOTE — Progress Notes (Signed)
03/17/2018 12:09 PM   Tyrone Luna Aug 07, 1946 [id number]  Referring provider: [name], MD [place] [facility] [place], [place] [address]  Chief Complaint  Patient presents with  . Benign Prostatic Hypertrophy    HPI: Patient is a 72 year old Caucasian male with ED and BPH with LU TS who presents today for a 6 month follow up.    BPH WITH LUTS  (prostate and/or bladder) His IPSS score today is 7, which is mild lower urinary tract symptomatology.  He is mostly satisfied with his quality life due to his urinary symptoms.  His previous IPSS score was 8/2.  His previous PVR was 52 mL.  His major complaints are nocturia x 2 to 3.  He has had these symptoms for several years.  He denies any dysuria, hematuria or suprapubic pain.   He currently taking dutasteride.  He also denies any recent fevers, chills, nausea or vomiting.  He does not have a family history of PCa.  IPSS    Row Name 03/17/18 1100         International Prostate Symptom Score   How often have you had the sensation of not emptying your bladder?  Not at All     How often have you had to urinate less than every two hours?  About half the time     How often have you found you stopped and started again several times when you urinated?  Not at All     How often have you found it difficult to postpone urination?  Less than 1 in 5 times     How often have you had a weak urinary stream?  Not at All     How often have you had to strain to start urination?  Not at All     How many times did you typically get up at night to urinate?  3 Times     Total IPSS Score  7       Quality of Life due to urinary symptoms   If you were to spend the rest of your life with your urinary condition just the way it is now how would you feel about that?  Mostly Satisfied        Score:  1-7 Mild 8-19 Moderate 20-35 Severe      Erectile dysfunction His previous SHIM score was 5.  He has been having  difficulty with erections for several months.   His major complaint is achieving.  His libido is preserved.   His risk factors for ED are sleep apnea, testosterone deficiency hyperlipidemia, HTN and BPH.  He denies any painful erections or curvatures with his erections.   He has tried Cialis in the past, he did not find them effective.  He states Viagra worked better.  He has been taking sildenafil 20 mg, but he has not found it effective.  He does not want to continue the Trimix as he is uncomfortable injecting himself.  He has tried a vacuum erection device in the past and was not satisfied with the device.     Score: 1-7 Severe ED 8-11 Moderate ED 12-16 Mild-Moderate ED 17-21 Mild ED 22-25 No ED   PMH: Past Medical History:  Diagnosis Date  . Arthritis   . BPH associated with nocturia   . ED (erectile dysfunction)   . Erectile dysfunction   . Hyperlipemia   . Hypertension   . Male hypogonadism   . Sleep apnea   .  Urinary frequency   . Urinary urgency     Surgical History: Past Surgical History:  Procedure Laterality Date  . EYE SURGERY Bilateral 2002   laser  . INGUINAL HERNIA REPAIR Left   . KNEE SURGERY Right 1995    Home Medications:  Allergies as of 03/17/2018   No Known Allergies     Medication List        Accurate as of 03/17/18 12:09 PM. Always use your most recent med list.          amLODipine 10 MG tablet Commonly known as:  NORVASC   aspirin 81 MG tablet Take 81 mg by mouth daily.   augmented betamethasone dipropionate 0.05 % cream Commonly known as:  DIPROLENE-AF Apply topically.   cetirizine 10 MG tablet Commonly known as:  ZYRTEC Take 10 mg by mouth daily.   dutasteride 0.5 MG capsule Commonly known as:  AVODART Take 1 capsule (0.5 mg total) by mouth daily.   EPIPEN 2-PAK 0.3 mg/0.3 mL Soaj injection Generic drug:  EPINEPHrine   fesoterodine 8 MG Tb24 tablet Commonly known as:  TOVIAZ Take 1 tablet (8 mg total) by mouth daily.     fluticasone 50 MCG/ACT nasal spray Commonly known as:  FLONASE   GLUCOSAMINE 1500 COMPLEX PO Take by mouth.   loratadine-pseudoephedrine 10-240 MG 24 hr tablet Commonly known as:  CLARITIN-D 24-hour Take 1 tablet by mouth daily.   omeprazole 40 MG capsule Commonly known as:  PRILOSEC Take 40 mg by mouth daily.   pilocarpine 5 MG tablet Commonly known as:  SALAGEN   PRESERVISION AREDS 2 PO Take by mouth.   sildenafil 20 MG tablet Commonly known as:  REVATIO Take 3 to 5 tablets two hours before intercouse on an empty stomach.  Do not take with nitrates.   simvastatin 20 MG tablet Commonly known as:  ZOCOR   tadalafil 20 MG tablet Commonly known as:  ADCIRCA/CIALIS Take 1 tablet (20 mg total) by mouth daily as needed for erectile dysfunction.   triamcinolone cream 0.5 % Commonly known as:  KENALOG APPLY TOPICALLY TWO TIMES DAILY FOR UP TO 7 TO 10 DAYS.   triamterene-hydrochlorothiazide 37.5-25 MG capsule Commonly known as:  DYAZIDE   Trospium Chloride 60 MG Cp24 Take 1 capsule (60 mg total) by mouth daily.       Allergies: No Known Allergies  Family History: Family History  Problem Relation Age of Onset  . Hypertension Unknown   . Prostate cancer Neg Hx   . Bladder Cancer Neg Hx   . Kidney cancer Neg Hx     Social History:  reports that he has quit smoking. He has never used smokeless tobacco. He reports that he does not drink alcohol or use drugs.  ROS: UROLOGY Frequent Urination?: No Hard to postpone urination?: No Burning/pain with urination?: No Get up at night to urinate?: Yes Leakage of urine?: No Urine stream starts and stops?: No Trouble starting stream?: No Do you have to strain to urinate?: No Blood in urine?: No Urinary tract infection?: No Sexually transmitted disease?: No Injury to kidneys or bladder?: No Painful intercourse?: No Weak stream?: No Erection problems?: Yes Penile pain?: No  Gastrointestinal Nausea?: No Vomiting?:  No Indigestion/heartburn?: No Diarrhea?: No Constipation?: No  Constitutional Fever: No Night sweats?: Yes Weight loss?: No Fatigue?: No  Skin Skin rash/lesions?: No Itching?: No  Eyes Blurred vision?: No Double vision?: No  Ears/Nose/Throat Sore throat?: No Sinus problems?: No  Hematologic/Lymphatic Swollen glands?: No Easy bruising?:  No  Cardiovascular Leg swelling?: No Chest pain?: No  Respiratory Cough?: No Shortness of breath?: No  Endocrine Excessive thirst?: No  Musculoskeletal Back pain?: No Joint pain?: No  Neurological Headaches?: No Dizziness?: No  Psychologic Depression?: No Anxiety?: No  Physical Exam: BP (!) 161/83 (BP Location: Left Arm, Patient Position: Sitting, Cuff Size: Large)   Pulse (!) 57   Wt 260 lb 4.8 oz (118.1 kg)   BMI 34.34 kg/m   Constitutional: Well nourished. Alert and oriented, No acute distress. HEENT: [place] AT, moist mucus membranes. Trachea midline, no masses. Cardiovascular: No clubbing, cyanosis, or edema. Respiratory: Normal respiratory effort, no increased work of breathing. GI: Abdomen is soft, non tender, non distended, no abdominal masses. Liver and spleen not palpable.  No hernias appreciated.  Stool sample for occult testing is not indicated.   GU: No CVA tenderness.  No bladder fullness or masses.  Patient with circumcised phallus.  Urethral meatus is patent.  No penile discharge. No penile lesions or rashes. Scrotum without lesions, cysts, rashes and/or edema.  Testicles are located scrotally bilaterally. No masses are appreciated in the testicles. Left and right epididymis are normal. Rectal: Patient with  normal sphincter tone. Anus and perineum without scarring or rashes. No rectal masses are appreciated. Prostate is approximately 60 grams, could only palpate the apex and mid portion of glands, no nodules are appreciated. Seminal vesicles are normal. Skin: No rashes, bruises or suspicious lesions. Lymph:  No cervical or inguinal adenopathy. Neurologic: Grossly intact, no focal deficits, moving all 4 extremities. Psychiatric: Normal mood and affect.   Laboratory Data:  PSA History: 0.66 (1.32) ng/mL on 05/14/2014   0.5 (1.0) ng/mL on 06/01/2016   0.7 (1.4)  ng/mL on 02/19/2017   0.6 (1.2) ng/mL on 02/17/2018  I have reviewed the labs.  Assessment & Plan:    1. Erectile dysfunction PDE5-inhibitors are not longer effective Trimix injections were not effective as he did not like injecting himself Vacuum erection device caused bruising  Not interested in penile prothesis  2. BPH with LUTS IPSS score is 7/2, it is slightly improved Continue conservative management, avoiding bladder irritants and timed voiding's Most bothersome symptoms is nocturia - does not want to pursue sleep study Continue dutasteride 0.5 mg daily:refills given RTC in 12 months for IPSS, PSA and exam   Return in about 1 year (around 03/18/2019) for IPSS, PSA and exam.  Zara Council, [facility]  [place] [address] [place], [place] [address] [phone]

## 2018-03-17 ENCOUNTER — Ambulatory Visit (INDEPENDENT_AMBULATORY_CARE_PROVIDER_SITE_OTHER): Payer: Medicare HMO | Admitting: Urology

## 2018-03-17 ENCOUNTER — Encounter: Payer: Self-pay | Admitting: Urology

## 2018-03-17 VITALS — BP 161/83 | HR 57 | Wt 260.3 lb

## 2018-03-17 DIAGNOSIS — N138 Other obstructive and reflux uropathy: Secondary | ICD-10-CM

## 2018-03-17 DIAGNOSIS — N529 Male erectile dysfunction, unspecified: Secondary | ICD-10-CM | POA: Diagnosis not present

## 2018-03-17 DIAGNOSIS — N401 Enlarged prostate with lower urinary tract symptoms: Secondary | ICD-10-CM

## 2018-03-17 MED ORDER — DUTASTERIDE 0.5 MG PO CAPS: 0.5000 mg | ORAL_CAPSULE | Freq: Every day | ORAL | 3 refills | Status: DC

## 2019-02-19 ENCOUNTER — Other Ambulatory Visit: Payer: Self-pay | Admitting: Otolaryngology

## 2019-02-19 DIAGNOSIS — H90A22 Sensorineural hearing loss, unilateral, left ear, with restricted hearing on the contralateral side: Secondary | ICD-10-CM

## 2019-03-01 ENCOUNTER — Other Ambulatory Visit: Payer: Self-pay

## 2019-03-01 ENCOUNTER — Ambulatory Visit
Admission: RE | Admit: 2019-03-01 | Discharge: 2019-03-01 | Disposition: A | Discharge status: Home / Self Care | Payer: Medicare HMO | Source: Ambulatory Visit | Attending: Otolaryngology | Admitting: Otolaryngology

## 2019-03-01 DIAGNOSIS — H90A22 Sensorineural hearing loss, unilateral, left ear, with restricted hearing on the contralateral side: Secondary | ICD-10-CM

## 2019-03-01 LAB — POCT I-STAT CREATININE: Creatinine, Ser: 1 mg/dL (ref 0.61–1.24)

## 2019-03-01 IMAGING — MR MR BRAIN/IAC WITHOUT AND WITH CONTRAST
10 of 14 series · 27 of 48 positions shown · IV contrast (Gadavist)
Comparison: None.

CLINICAL DATA: Worsening hearing loss on the left over the last 2
months.

EXAM:
MRI HEAD WITHOUT AND WITH CONTRAST
TECHNIQUE: Multiplanar, multiecho pulse sequences of the brain and surrounding
structures were obtained without and with intravenous contrast.
CONTRAST:  10 cc Gadavist

[Series 5: T1 · sagittal · 5.0mm · 0.62mm/px · 2 of 22 slices shown (1 of 3)]
[im 1/22]
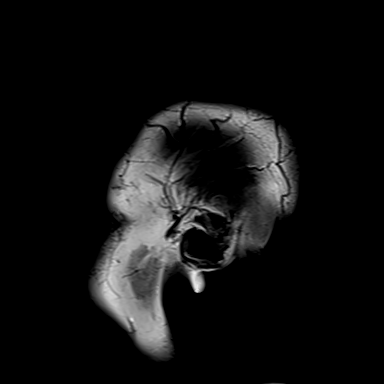
[im 22/22]
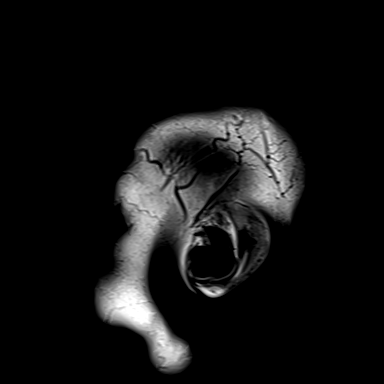

[Series 6: T2 · axial · 5.0mm · 0.53mm/px · z∈[-84,+60]mm · 2 of 25 slices shown]
[im 1/25]
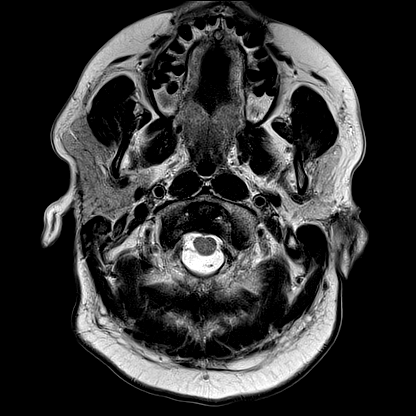
[im 25/25]
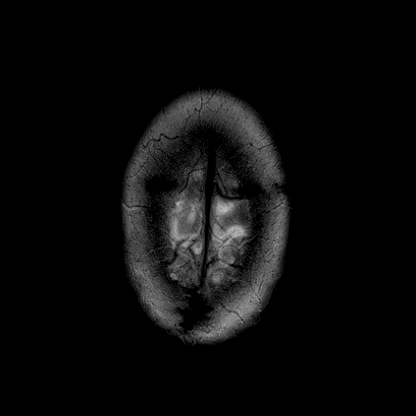

[Series 7: ax dwi_tracew · axial · 3.0mm · 0.73mm/px · z∈[-96,+66]mm · 4 of 55 slices shown]
[im 1/55]
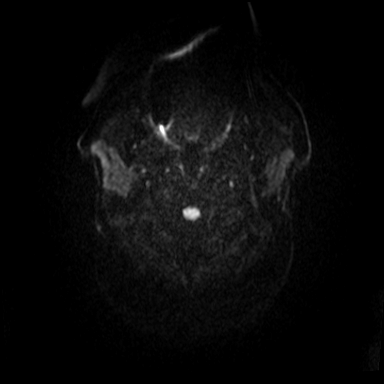
[im 19/55]
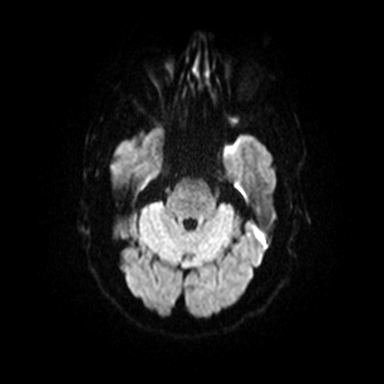
[im 37/55]
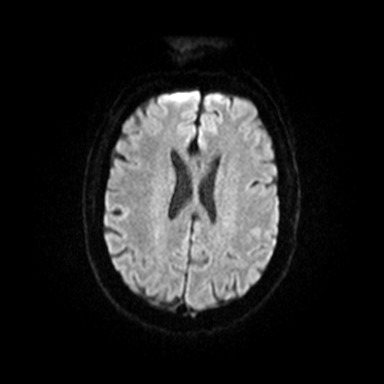
[im 55/55]
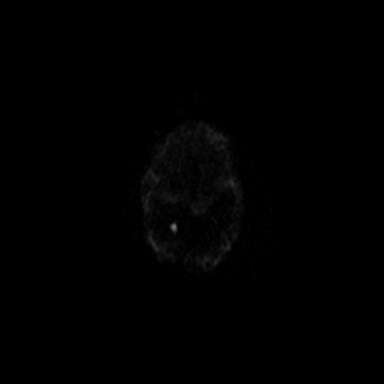

[Series 8: ax dwi_adc · axial · 3.0mm · 0.73mm/px · z∈[-96,-42]mm · 2 of 55 slices shown]
[im 1/55]
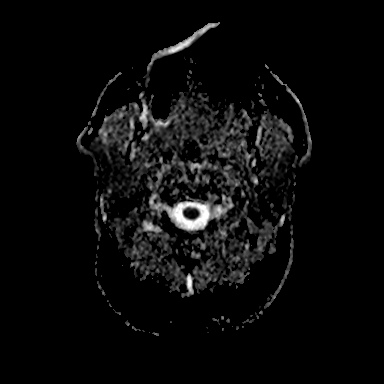
[im 19/55]
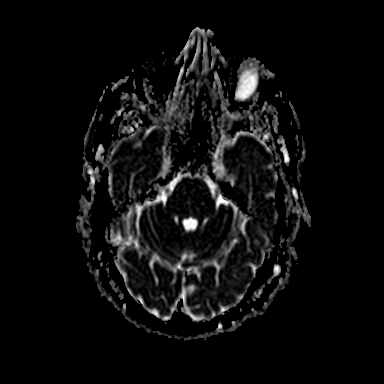

[Series 9: T1 · coronal · non-contrast · 3.0mm · 0.21mm/px · 1 of 13 slices shown (2 of 3)]
[im 1/13]
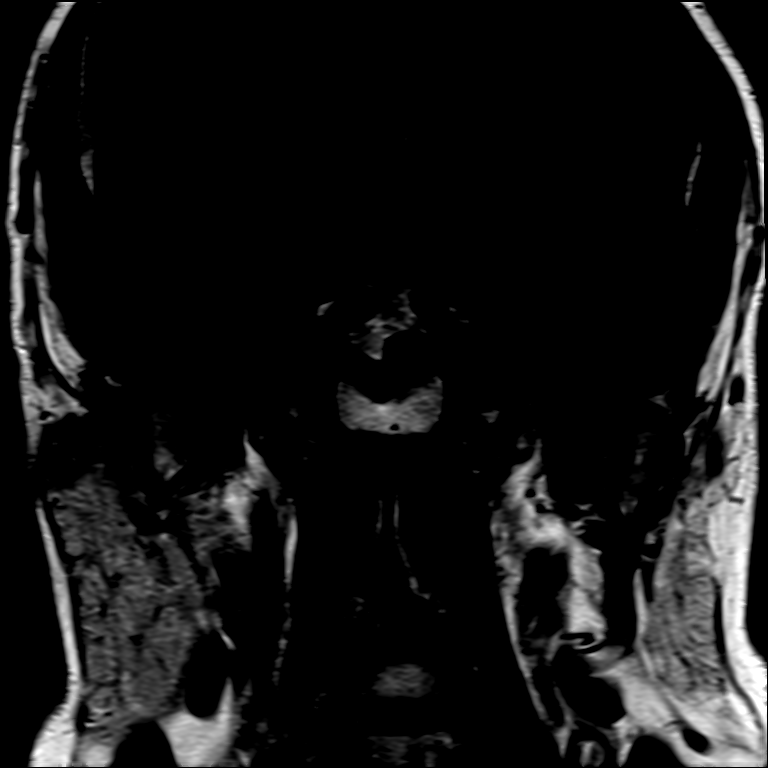

[Series 14: FLAIR · axial · 3.0mm · 0.53mm/px · z∈[-93,+69]mm · 4 of 55 slices shown]
[im 1/55]
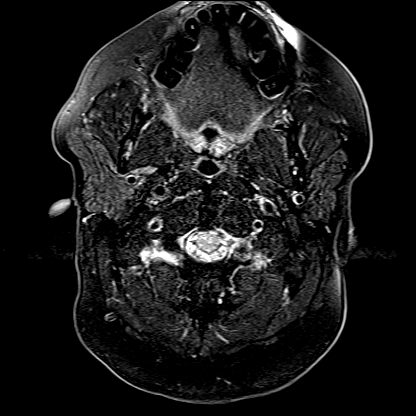
[im 19/55]
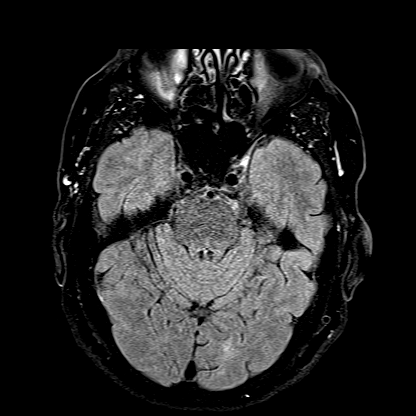
[im 37/55]
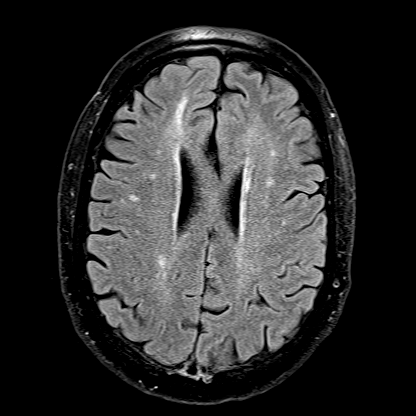
[im 55/55]
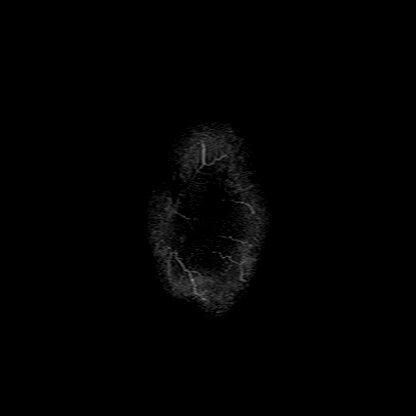

[Series 16: T1 · axial · non-contrast · 3.0mm · 0.21mm/px · 1 of 15 slices shown (3 of 3)]
[im 1/15]
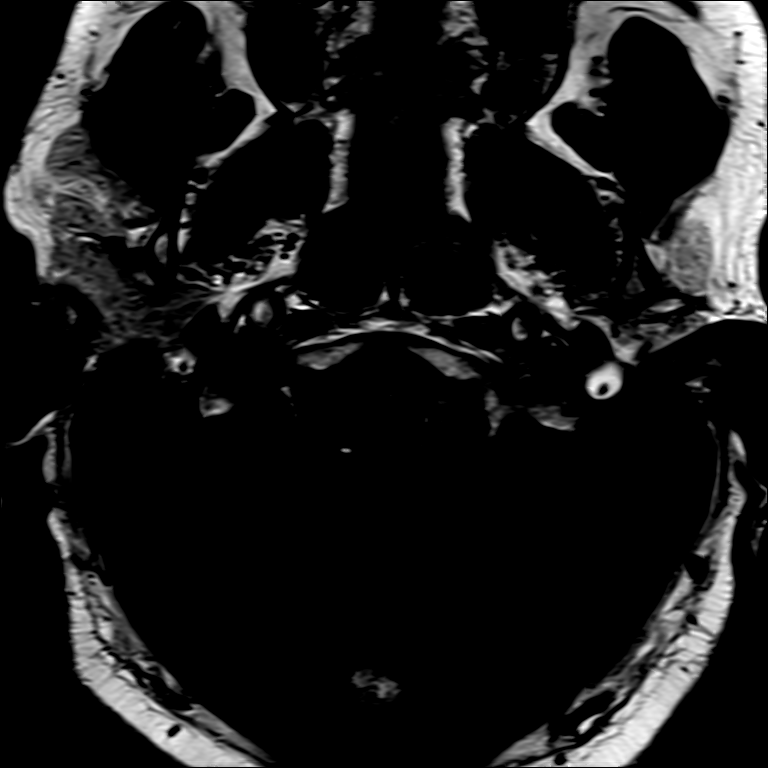

[Series 17: T1 post-contrast · axial · 3.0mm · 0.21mm/px · 1 of 15 slices shown (1 of 3)]
[im 1/15]
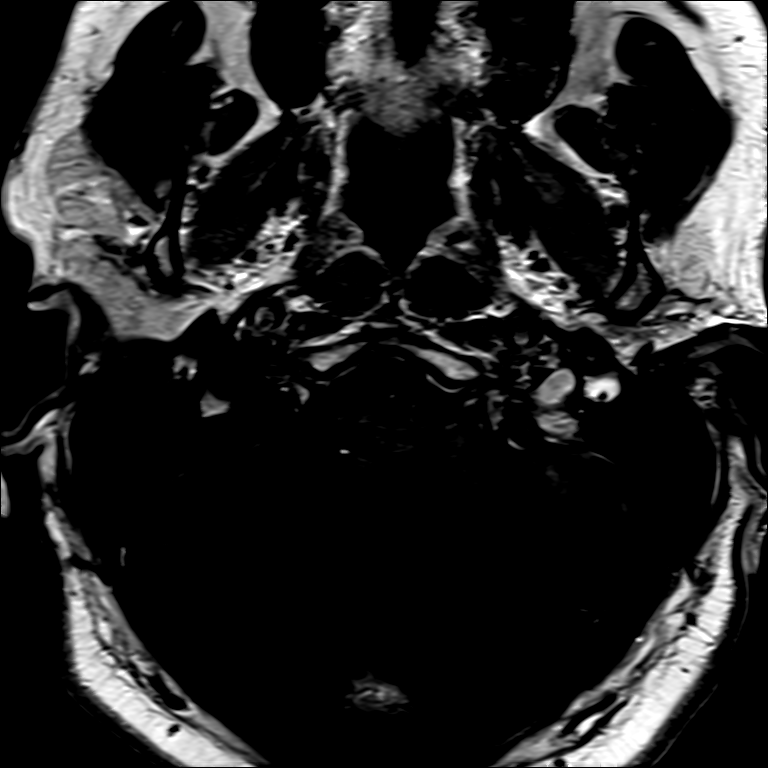

[Series 18: T1 post-contrast · coronal · 3.0mm · 0.21mm/px · 1 of 13 slices shown (2 of 3)]
[im 1/13]
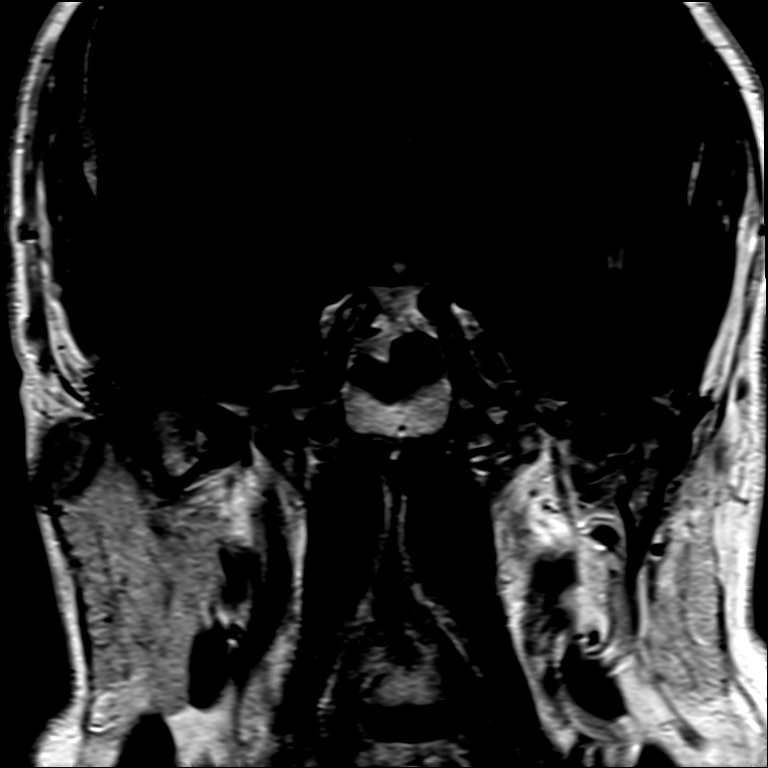

[Series 19: T1 post-contrast · axial · 1.0mm · 0.98mm/px · z∈[-100,+75]mm · 9 of 176 slices shown (3 of 3)]
[im 1/176]
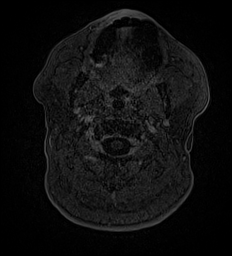
[im 32/176]
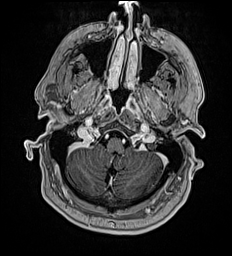
[im 48/176]
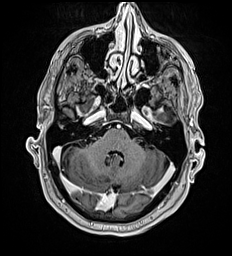
[im 80/176]
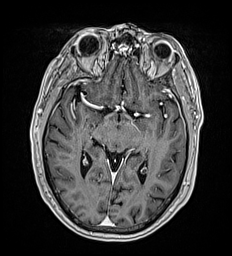
[im 96/176]
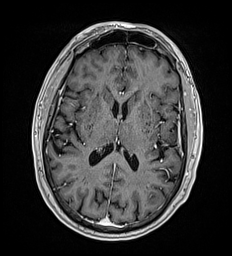
[im 128/176]
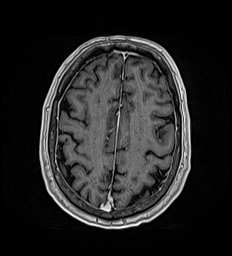
[im 144/176]
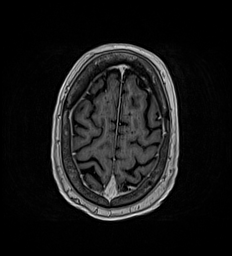
[im 160/176]
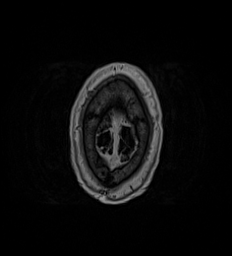
[im 176/176]
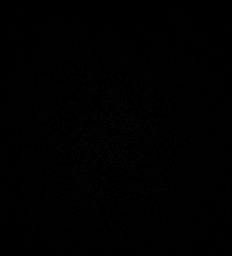

[27 of 48 positions shown; findings below may reference images not displayed]

FINDINGS: Brain: Diffusion imaging does not show any acute or subacute
infarction. No focal abnormality affects the brainstem or
cerebellum. CP angle regions are normal. No vestibular schwannoma or
enhancing neuritis. No fluid visible in the middle ears or mastoids.
Inner ear structures appear normal by MRI. Cerebral hemispheres show
dilated perivascular spaces at the base of the brain. There are mild
chronic small-vessel ischemic changes of the cerebral hemispheric
white matter. No large vessel territory infarction. No mass lesion,
hemorrhage, hydrocephalus or extra-axial collection.

Vascular: Major vessels at the base of the brain show flow.

Skull and upper cervical spine: Negative

Sinuses/Orbits: Clear/normal

Other: None
IMPRESSION: No abnormality seen to explain left-sided hearing loss. Brainstem
and CP angle regions are normal. No vestibular schwannoma or
enhancing neuritis. No fluid seen in the middle ears or mastoids.
Inner ear structures appear normal.

Cerebral hemispheres show mild chronic small-vessel ischemic changes
of the white matter. No recent brain insult.

## 2019-03-01 MED ORDER — GADOBUTROL 1 MMOL/ML IV SOLN
10.0000 mL | Freq: Once | INTRAVENOUS | Status: AC | PRN
  Administered 2019-03-01: 10 mL via INTRAVENOUS

## 2019-03-11 ENCOUNTER — Other Ambulatory Visit: Payer: Medicare HMO

## 2019-03-12 ENCOUNTER — Other Ambulatory Visit: Payer: Self-pay

## 2019-03-12 ENCOUNTER — Emergency Department: Payer: Medicare HMO

## 2019-03-12 ENCOUNTER — Inpatient Hospital Stay
Admission: EM | Admit: 2019-03-12 | Discharge: 2019-03-23 | DRG: 853 | Disposition: A | Discharge status: Home Health | Payer: Medicare HMO | Attending: Surgery | Admitting: Surgery

## 2019-03-12 ENCOUNTER — Encounter: Payer: Self-pay | Admitting: Medical Oncology

## 2019-03-12 DIAGNOSIS — N401 Enlarged prostate with lower urinary tract symptoms: Secondary | ICD-10-CM | POA: Diagnosis present

## 2019-03-12 DIAGNOSIS — Z8249 Family history of ischemic heart disease and other diseases of the circulatory system: Secondary | ICD-10-CM

## 2019-03-12 DIAGNOSIS — K660 Peritoneal adhesions (postprocedural) (postinfection): Secondary | ICD-10-CM | POA: Diagnosis present

## 2019-03-12 DIAGNOSIS — K650 Generalized (acute) peritonitis: Secondary | ICD-10-CM | POA: Diagnosis present

## 2019-03-12 DIAGNOSIS — R351 Nocturia: Secondary | ICD-10-CM | POA: Diagnosis present

## 2019-03-12 DIAGNOSIS — Z6832 Body mass index (BMI) 32.0-32.9, adult: Secondary | ICD-10-CM | POA: Diagnosis not present

## 2019-03-12 DIAGNOSIS — M1990 Unspecified osteoarthritis, unspecified site: Secondary | ICD-10-CM | POA: Diagnosis present

## 2019-03-12 DIAGNOSIS — Z79899 Other long term (current) drug therapy: Secondary | ICD-10-CM

## 2019-03-12 DIAGNOSIS — R3915 Urgency of urination: Secondary | ICD-10-CM | POA: Diagnosis present

## 2019-03-12 DIAGNOSIS — R739 Hyperglycemia, unspecified: Secondary | ICD-10-CM | POA: Diagnosis present

## 2019-03-12 DIAGNOSIS — E785 Hyperlipidemia, unspecified: Secondary | ICD-10-CM | POA: Diagnosis present

## 2019-03-12 DIAGNOSIS — R350 Frequency of micturition: Secondary | ICD-10-CM | POA: Diagnosis present

## 2019-03-12 DIAGNOSIS — Z87891 Personal history of nicotine dependence: Secondary | ICD-10-CM

## 2019-03-12 DIAGNOSIS — K5780 Diverticulitis of intestine, part unspecified, with perforation and abscess without bleeding: Secondary | ICD-10-CM | POA: Diagnosis present

## 2019-03-12 DIAGNOSIS — R531 Weakness: Secondary | ICD-10-CM | POA: Diagnosis not present

## 2019-03-12 DIAGNOSIS — Z933 Colostomy status: Secondary | ICD-10-CM | POA: Diagnosis not present

## 2019-03-12 DIAGNOSIS — I10 Essential (primary) hypertension: Secondary | ICD-10-CM | POA: Diagnosis present

## 2019-03-12 DIAGNOSIS — A419 Sepsis, unspecified organism: Secondary | ICD-10-CM | POA: Diagnosis present

## 2019-03-12 DIAGNOSIS — Z7982 Long term (current) use of aspirin: Secondary | ICD-10-CM | POA: Diagnosis not present

## 2019-03-12 DIAGNOSIS — N529 Male erectile dysfunction, unspecified: Secondary | ICD-10-CM | POA: Diagnosis present

## 2019-03-12 DIAGNOSIS — K5732 Diverticulitis of large intestine without perforation or abscess without bleeding: Secondary | ICD-10-CM | POA: Diagnosis present

## 2019-03-12 DIAGNOSIS — K567 Ileus, unspecified: Secondary | ICD-10-CM | POA: Diagnosis not present

## 2019-03-12 DIAGNOSIS — G4733 Obstructive sleep apnea (adult) (pediatric): Secondary | ICD-10-CM | POA: Diagnosis present

## 2019-03-12 DIAGNOSIS — Z20828 Contact with and (suspected) exposure to other viral communicable diseases: Secondary | ICD-10-CM | POA: Diagnosis present

## 2019-03-12 DIAGNOSIS — R609 Edema, unspecified: Secondary | ICD-10-CM | POA: Diagnosis present

## 2019-03-12 DIAGNOSIS — E876 Hypokalemia: Secondary | ICD-10-CM | POA: Diagnosis not present

## 2019-03-12 DIAGNOSIS — K5720 Diverticulitis of large intestine with perforation and abscess without bleeding: Secondary | ICD-10-CM | POA: Diagnosis present

## 2019-03-12 LAB — CBC WITH DIFFERENTIAL/PLATELET
Abs Immature Granulocytes: 0 10*3/uL (ref 0.00–0.07)
Band Neutrophils: 5 %
Basophils Absolute: 0 10*3/uL (ref 0.0–0.1)
Basophils Relative: 0 %
Eosinophils Absolute: 0.2 10*3/uL (ref 0.0–0.5)
Eosinophils Relative: 1 %
HCT: 53.7 % — ABNORMAL HIGH (ref 39.0–52.0)
Hemoglobin: 18.8 g/dL — ABNORMAL HIGH (ref 13.0–17.0)
Lymphocytes Relative: 5 %
Lymphs Abs: 0.9 10*3/uL (ref 0.7–4.0)
MCH: 30.4 pg (ref 26.0–34.0)
MCHC: 35 g/dL (ref 30.0–36.0)
MCV: 86.9 fL (ref 80.0–100.0)
Monocytes Absolute: 0.5 10*3/uL (ref 0.1–1.0)
Monocytes Relative: 3 %
Neutro Abs: 15.7 10*3/uL — ABNORMAL HIGH (ref 1.7–7.7)
Neutrophils Relative %: 86 %
Platelets: 143 10*3/uL — ABNORMAL LOW (ref 150–400)
RBC: 6.18 MIL/uL — ABNORMAL HIGH (ref 4.22–5.81)
RDW: 13.8 % (ref 11.5–15.5)
WBC: 17.3 10*3/uL — ABNORMAL HIGH (ref 4.0–10.5)
nRBC: 0 % (ref 0.0–0.2)

## 2019-03-12 LAB — SARS CORONAVIRUS 2 BY RT PCR (HOSPITAL ORDER, PERFORMED IN ~~LOC~~ HOSPITAL LAB): SARS Coronavirus 2: NEGATIVE

## 2019-03-12 LAB — URINALYSIS, COMPLETE (UACMP) WITH MICROSCOPIC
Bacteria, UA: NONE SEEN
Bilirubin Urine: NEGATIVE
Glucose, UA: NEGATIVE mg/dL
Hgb urine dipstick: NEGATIVE
Ketones, ur: NEGATIVE mg/dL
Leukocytes,Ua: NEGATIVE
Nitrite: NEGATIVE
Protein, ur: NEGATIVE mg/dL
Specific Gravity, Urine: 1.012 (ref 1.005–1.030)
Squamous Epithelial / LPF: NONE SEEN (ref 0–5)
pH: 6 (ref 5.0–8.0)

## 2019-03-12 LAB — COMPREHENSIVE METABOLIC PANEL
ALT: 41 U/L (ref 0–44)
AST: 26 U/L (ref 15–41)
Albumin: 3.2 g/dL — ABNORMAL LOW (ref 3.5–5.0)
Alkaline Phosphatase: 42 U/L (ref 38–126)
Anion gap: 13 (ref 5–15)
BUN: 29 mg/dL — ABNORMAL HIGH (ref 8–23)
CO2: 20 mmol/L — ABNORMAL LOW (ref 22–32)
Calcium: 9 mg/dL (ref 8.9–10.3)
Chloride: 101 mmol/L (ref 98–111)
Creatinine, Ser: 1.08 mg/dL (ref 0.61–1.24)
GFR calc Af Amer: >60 mL/min (ref >60)
GFR calc non Af Amer: >60 mL/min (ref >60)
Glucose, Bld: 144 mg/dL — ABNORMAL HIGH (ref 70–99)
Potassium: 3.5 mmol/L (ref 3.5–5.1)
Sodium: 134 mmol/L — ABNORMAL LOW (ref 135–145)
Total Bilirubin: 2.6 mg/dL — ABNORMAL HIGH (ref 0.3–1.2)
Total Protein: 6 g/dL — ABNORMAL LOW (ref 6.5–8.1)

## 2019-03-12 LAB — PROTIME-INR
INR: 1 (ref 0.8–1.2)
Prothrombin Time: 13.4 seconds (ref 11.4–15.2)

## 2019-03-12 LAB — LACTIC ACID, PLASMA
Lactic Acid, Venous: 2.9 mmol/L (ref 0.5–1.9)
Lactic Acid, Venous: 3.3 mmol/L (ref 0.5–1.9)

## 2019-03-12 IMAGING — DX PORTABLE CHEST - 1 VIEW
1 series · 1 of 1 positions shown · non-contrast
Comparison: None.

CLINICAL DATA: Sepsis.

EXAM:
PORTABLE CHEST 1 VIEW

[chest ap]
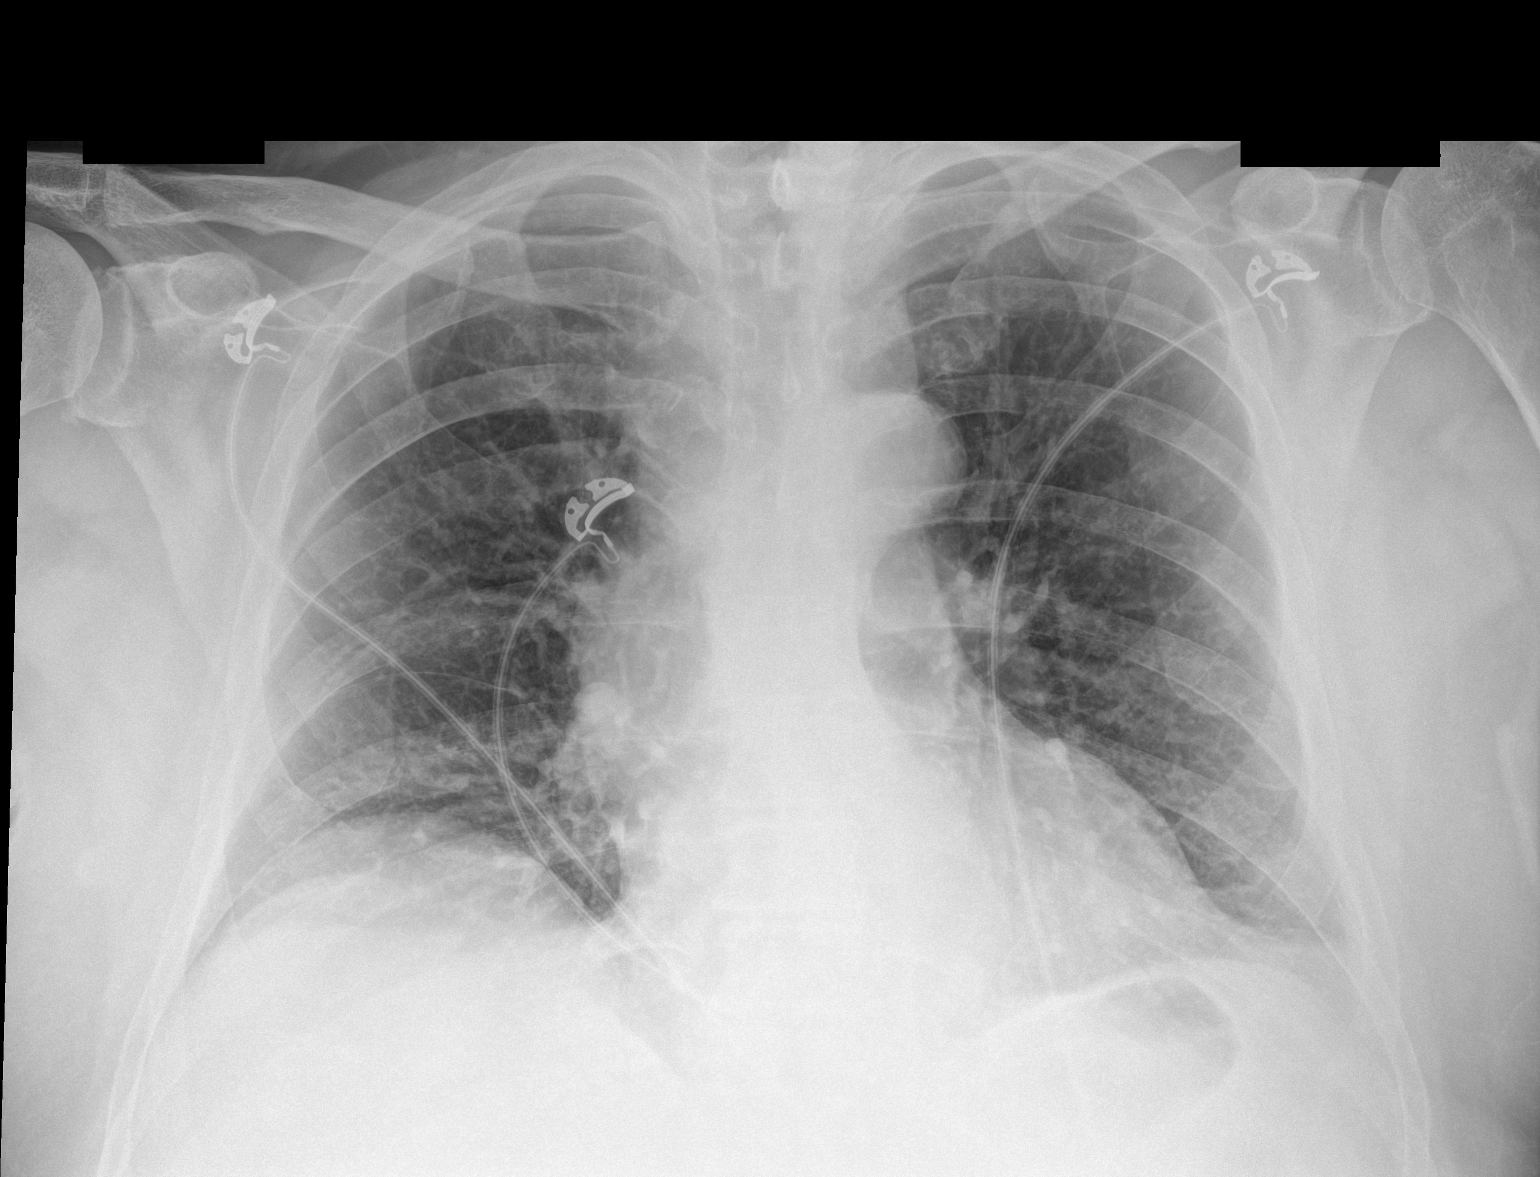

[1 of 1 positions shown; findings below may reference images not displayed]

FINDINGS: The cardiac silhouette is mildly enlarged. Calcific atherosclerotic
disease and tortuosity of the aorta. Mediastinal contours appear
intact.

There is no evidence of focal airspace consolidation, pleural
effusion or pneumothorax.

Osseous structures are without acute abnormality. Soft tissues are
grossly normal.
IMPRESSION: 1. Mildly enlarged cardiac silhouette.
2. Calcific atherosclerotic disease and tortuosity of the aorta.

## 2019-03-12 IMAGING — CT CT ABDOMEN AND PELVIS WITH CONTRAST
2 of 5 series · 16 of 46 positions shown, 18 images · IV contrast (APPLIED)
Comparison: None.

CLINICAL DATA: Generalized abdominal pain and distension beginning
this morning. Fever.

EXAM:
CT ABDOMEN AND PELVIS WITH CONTRAST
TECHNIQUE: Multidetector CT imaging of the abdomen and pelvis was performed
using the standard protocol following bolus administration of
intravenous contrast.
CONTRAST:  100mL OMNIPAQUE IOHEXOL 300 MG/ML  SOLN

[Series 2: routine abd/pel with · axial · 0.82mm/px · z∈[-965,-505]mm · 13 of 104 slices shown, 15 images]
[im 6/104  soft-tissue]
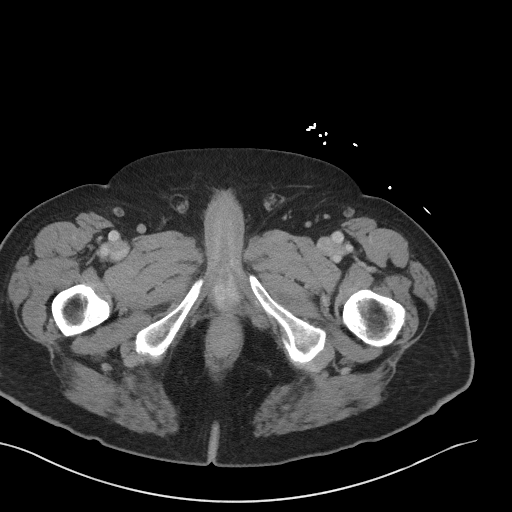
[im 6/104  bone]
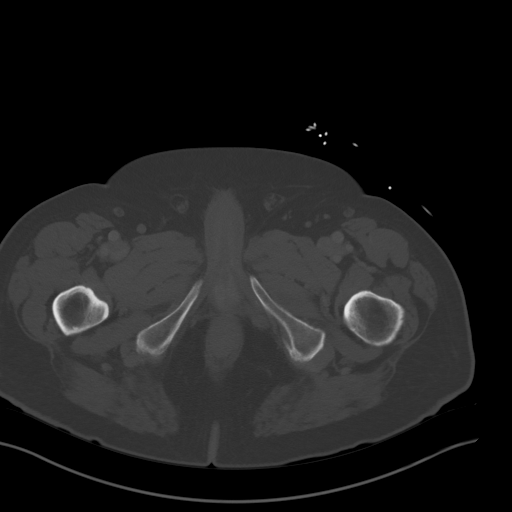
[im 17/104  soft-tissue]
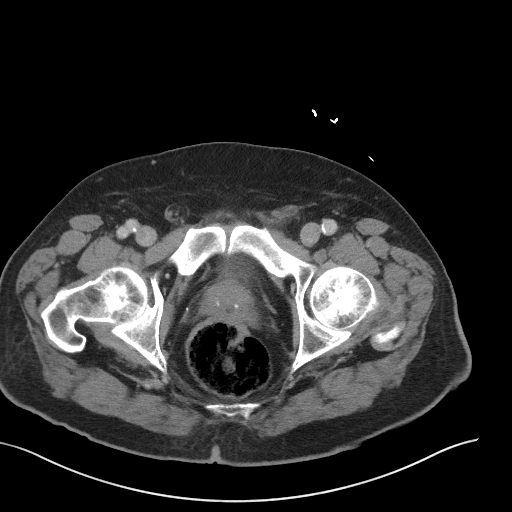
[im 22/104  soft-tissue]
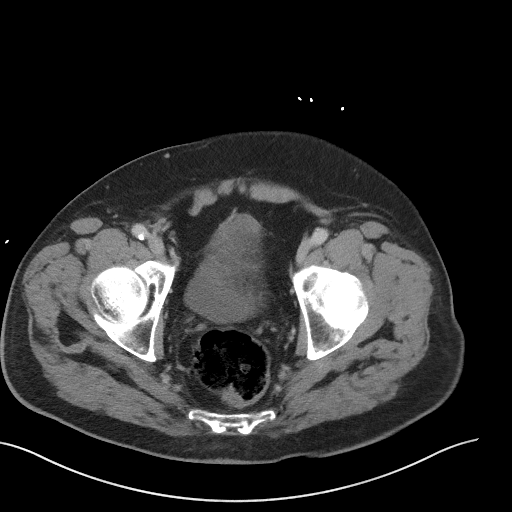
[im 28/104  soft-tissue]
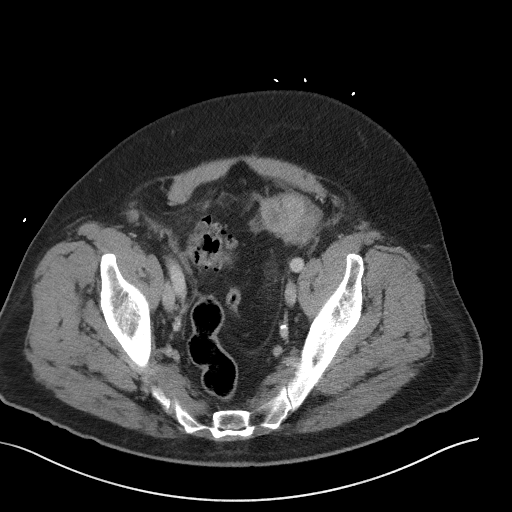
[im 38/104  soft-tissue]
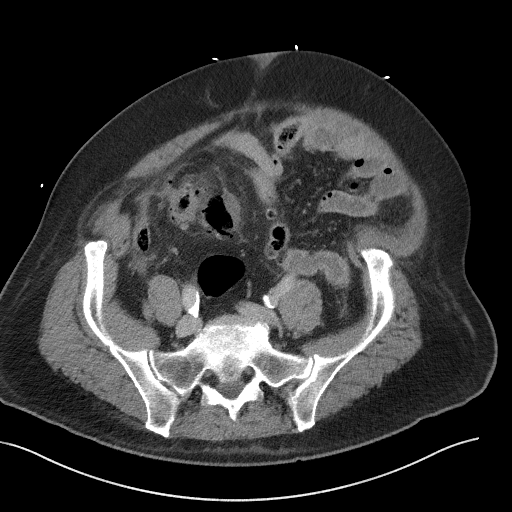
[im 44/104  soft-tissue]
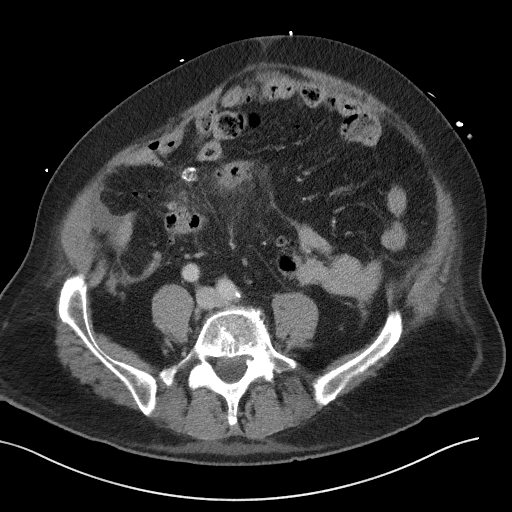
[im 55/104  soft-tissue]
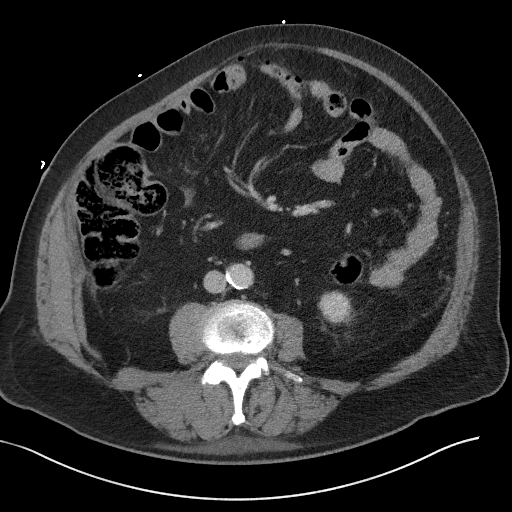
[im 60/104  soft-tissue]
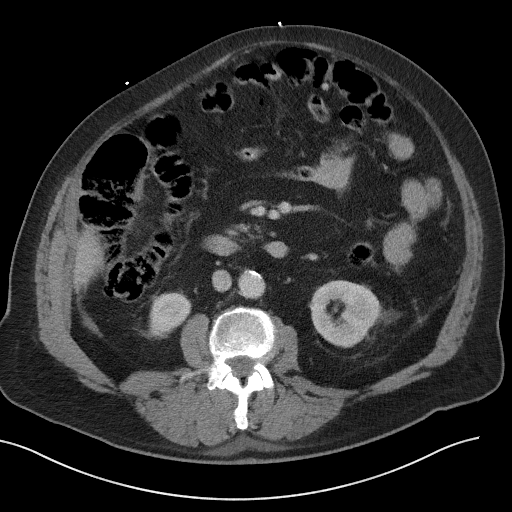
[im 66/104  soft-tissue]
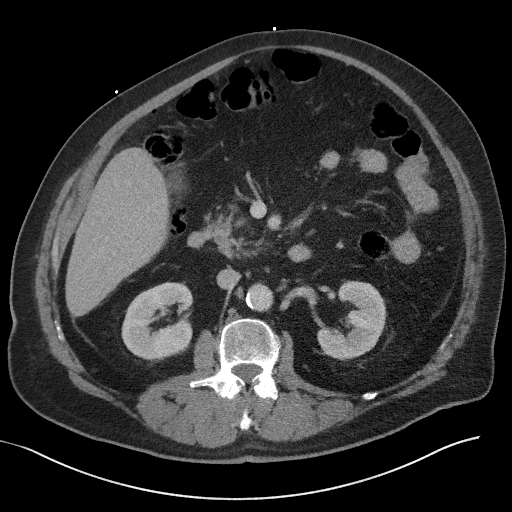
[im 66/104  bone]
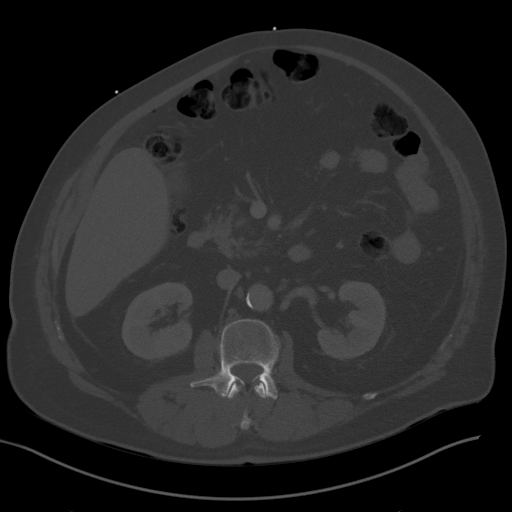
[im 76/104  soft-tissue]
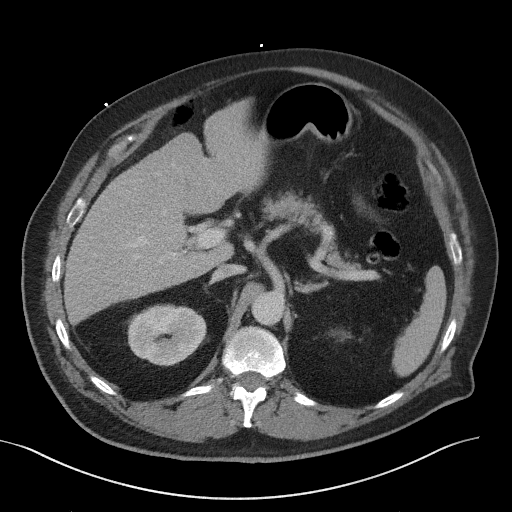
[im 82/104  soft-tissue]
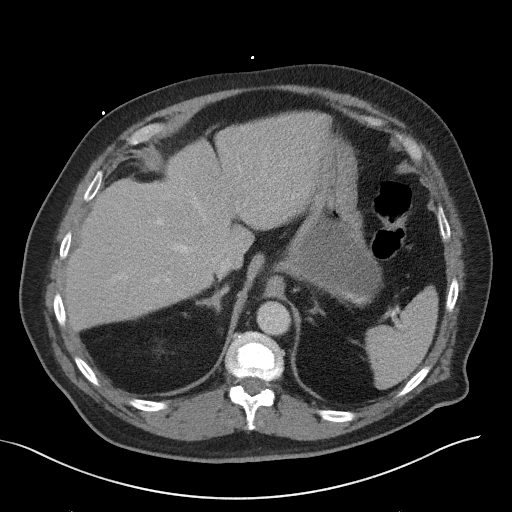
[im 87/104  soft-tissue]
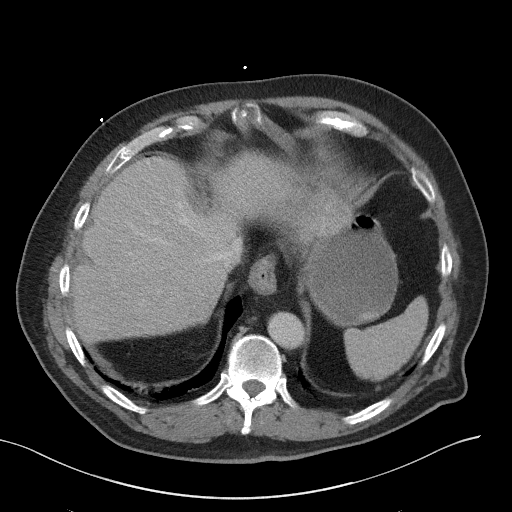
[im 98/104  soft-tissue]
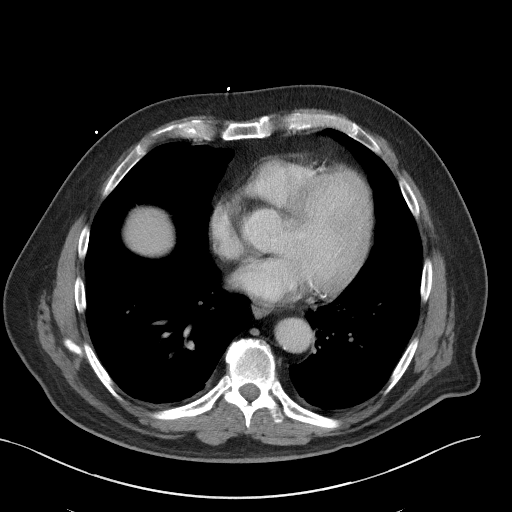

[Series 5: coronal st · coronal · 0.87mm/px · 3 of 124 slices shown]
[im 42/124  soft-tissue]
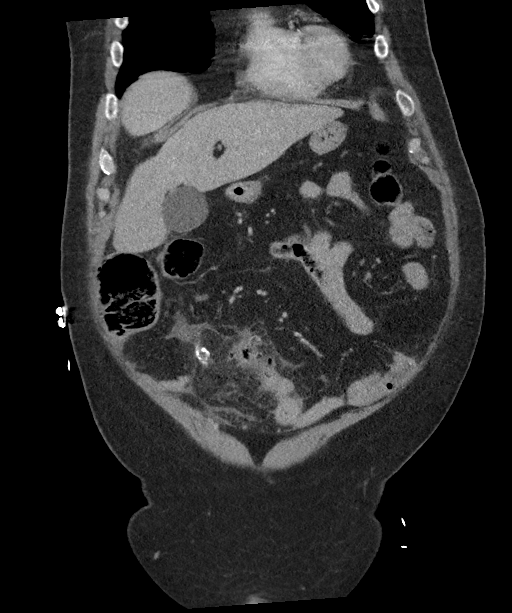
[im 55/124  soft-tissue]
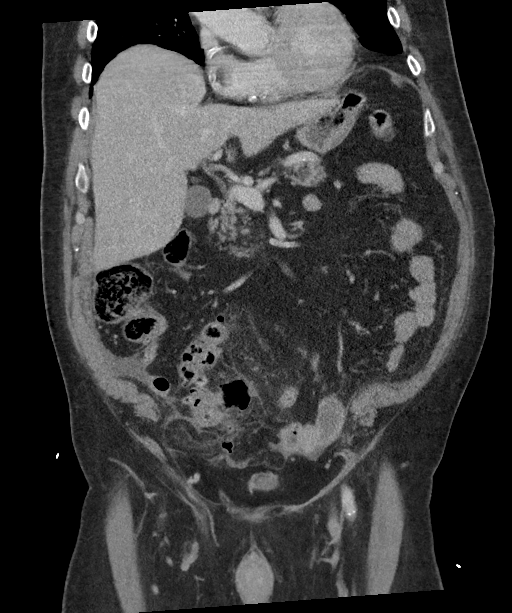
[im 69/124  soft-tissue]
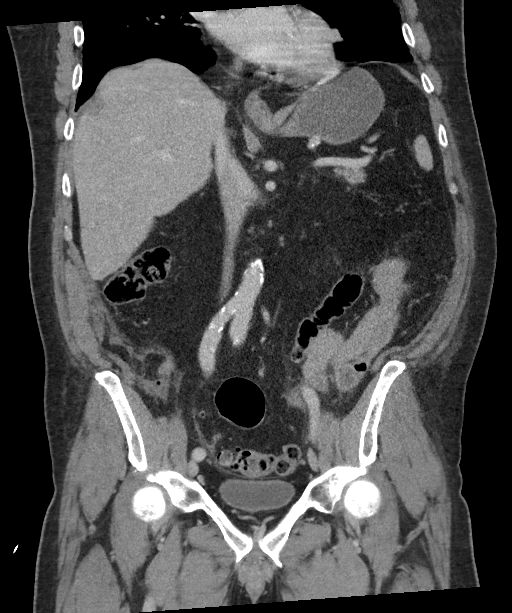

[16 of 46 positions shown; findings below may reference images not displayed]

FINDINGS: Lower Chest: No acute findings.

Hepatobiliary: No hepatic masses identified. A few tiny gallstones
are noted, however there is no evidence of cholecystitis or biliary
ductal dilatation.

Pancreas:  No mass or inflammatory changes.

Spleen: Within normal limits in size and appearance.

Adrenals/Urinary Tract: No masses identified. A few tiny renal cysts
are noted. No evidence of hydronephrosis.

Stomach/Bowel: Moderate diverticulitis is seen involving the sigmoid
colon. A extraluminal gas collection is seen in the adjacent sigmoid
mesocolon which measures 4 cm, however no fluid collection is seen.
A tiny amount of free intraperitoneal air is also seen, consistent
with bowel perforation.

Vascular/Lymphatic: No pathologically enlarged lymph nodes. No
abdominal aortic aneurysm. Aortic atherosclerosis.

Reproductive:  No mass or other significant abnormality.

Other:  None.

Musculoskeletal:  No suspicious bone lesions identified.
IMPRESSION: 1. Perforated sigmoid diverticulitis, with tiny amount of free
intraperitoneal air.
2. Cholelithiasis. No radiographic evidence of cholecystitis.

Critical Value/emergent results were called by telephone at the time
of interpretation on [DATE] at [DATE] to Dr. RHAMAN MIZA ,
who verbally acknowledged these results.

Aortic Atherosclerosis ([T4]-[T4]).

## 2019-03-12 MED ORDER — MORPHINE SULFATE (PF) 2 MG/ML IV SOLN
INTRAVENOUS | Status: AC
  Administered 2019-03-12: 2 mg via INTRAVENOUS
  Filled 2019-03-12: qty 1

## 2019-03-12 MED ORDER — HYDROCODONE-ACETAMINOPHEN 7.5-325 MG PO TABS
1.0000 | ORAL_TABLET | ORAL | Status: AC
  Administered 2019-03-12: 22:00:00 1 via ORAL
  Filled 2019-03-12: qty 1

## 2019-03-12 MED ORDER — DUTASTERIDE 0.5 MG PO CAPS
0.5000 mg | ORAL_CAPSULE | Freq: Every day | ORAL | Status: DC
  Administered 2019-03-12 – 2019-03-23 (×12): 0.5 mg via ORAL
  Filled 2019-03-12 (×12): qty 1

## 2019-03-12 MED ORDER — FLUTICASONE PROPIONATE 50 MCG/ACT NA SUSP
1.0000 | Freq: Every day | NASAL | Status: DC
  Administered 2019-03-12 – 2019-03-23 (×11): 1 via NASAL
  Filled 2019-03-12 (×2): qty 16

## 2019-03-12 MED ORDER — ENOXAPARIN SODIUM 40 MG/0.4ML ~~LOC~~ SOLN
40.0000 mg | SUBCUTANEOUS | Status: DC
  Administered 2019-03-13: 11:00:00 40 mg via SUBCUTANEOUS
  Filled 2019-03-12 (×2): qty 0.4

## 2019-03-12 MED ORDER — ONDANSETRON HCL 4 MG/2ML IJ SOLN
4.0000 mg | Freq: Once | INTRAMUSCULAR | Status: AC
  Administered 2019-03-12: 4 mg via INTRAVENOUS
  Filled 2019-03-12: qty 2

## 2019-03-12 MED ORDER — PIPERACILLIN-TAZOBACTAM 3.375 G IVPB
3.3750 g | Freq: Three times a day (TID) | INTRAVENOUS | Status: DC
  Administered 2019-03-12 – 2019-03-23 (×33): 3.375 g via INTRAVENOUS
  Filled 2019-03-12 (×36): qty 50

## 2019-03-12 MED ORDER — SIMVASTATIN 20 MG PO TABS
20.0000 mg | ORAL_TABLET | Freq: Every day | ORAL | Status: DC
  Administered 2019-03-12 – 2019-03-23 (×10): 20 mg via ORAL
  Filled 2019-03-12 (×3): qty 1
  Filled 2019-03-12: qty 2
  Filled 2019-03-12: qty 1
  Filled 2019-03-12 (×3): qty 2
  Filled 2019-03-12 (×3): qty 1

## 2019-03-12 MED ORDER — LORATADINE 10 MG PO TABS
10.0000 mg | ORAL_TABLET | Freq: Every day | ORAL | Status: DC
  Administered 2019-03-13 – 2019-03-23 (×11): 10 mg via ORAL
  Filled 2019-03-12 (×11): qty 1

## 2019-03-12 MED ORDER — ONDANSETRON HCL 4 MG/2ML IJ SOLN: 4.0000 mg | Freq: Four times a day (QID) | INTRAMUSCULAR | Status: DC | PRN

## 2019-03-12 MED ORDER — SODIUM CHLORIDE 0.9 % IV SOLN
INTRAVENOUS | Status: DC | PRN
  Administered 2019-03-12 – 2019-03-23 (×12): 250 mL via INTRAVENOUS

## 2019-03-12 MED ORDER — TRIAMTERENE-HCTZ 37.5-25 MG PO TABS
1.0000 | ORAL_TABLET | Freq: Every day | ORAL | Status: DC
  Administered 2019-03-13 – 2019-03-23 (×11): 1 via ORAL
  Filled 2019-03-12 (×12): qty 1

## 2019-03-12 MED ORDER — IOHEXOL 300 MG/ML  SOLN
100.0000 mL | Freq: Once | INTRAMUSCULAR | Status: AC | PRN
  Administered 2019-03-12: 100 mL via INTRAVENOUS

## 2019-03-12 MED ORDER — MORPHINE SULFATE (PF) 2 MG/ML IV SOLN
1.0000 mg | INTRAVENOUS | Status: DC | PRN
  Administered 2019-03-12 – 2019-03-17 (×6): 1 mg via INTRAVENOUS
  Filled 2019-03-12 (×6): qty 1

## 2019-03-12 MED ORDER — LACTATED RINGERS IV SOLN
INTRAVENOUS | Status: DC
  Administered 2019-03-12 – 2019-03-13 (×2): via INTRAVENOUS

## 2019-03-12 MED ORDER — PILOCARPINE HCL 5 MG PO TABS
5.0000 mg | ORAL_TABLET | Freq: Two times a day (BID) | ORAL | Status: DC
  Administered 2019-03-12 – 2019-03-23 (×18): 5 mg via ORAL
  Filled 2019-03-12 (×23): qty 1

## 2019-03-12 MED ORDER — SODIUM CHLORIDE 0.9% FLUSH: 3.0000 mL | Freq: Once | INTRAVENOUS | Status: DC

## 2019-03-12 MED ORDER — PIPERACILLIN-TAZOBACTAM 3.375 G IVPB 30 MIN
3.3750 g | Freq: Once | INTRAVENOUS | Status: AC
  Administered 2019-03-12: 12:00:00 3.375 g via INTRAVENOUS
  Filled 2019-03-12: qty 50

## 2019-03-12 MED ORDER — PANTOPRAZOLE SODIUM 40 MG IV SOLR
40.0000 mg | Freq: Every day | INTRAVENOUS | Status: DC
  Administered 2019-03-12 – 2019-03-22 (×11): 40 mg via INTRAVENOUS
  Filled 2019-03-12 (×11): qty 40

## 2019-03-12 MED ORDER — DOCUSATE SODIUM 100 MG PO CAPS: 100.0000 mg | ORAL_CAPSULE | Freq: Two times a day (BID) | ORAL | Status: DC | PRN

## 2019-03-12 MED ORDER — MORPHINE SULFATE (PF) 2 MG/ML IV SOLN
2.0000 mg | Freq: Once | INTRAVENOUS | Status: AC
  Administered 2019-03-12: 2 mg via INTRAVENOUS

## 2019-03-12 MED ORDER — AMLODIPINE BESYLATE 10 MG PO TABS
10.0000 mg | ORAL_TABLET | Freq: Every day | ORAL | Status: DC
  Administered 2019-03-13 – 2019-03-23 (×11): 10 mg via ORAL
  Filled 2019-03-12 (×11): qty 1

## 2019-03-12 MED ORDER — ONDANSETRON 4 MG PO TBDP
4.0000 mg | ORAL_TABLET | Freq: Four times a day (QID) | ORAL | Status: DC | PRN
  Filled 2019-03-12: qty 1

## 2019-03-12 MED ORDER — MORPHINE SULFATE (PF) 2 MG/ML IV SOLN
2.0000 mg | Freq: Once | INTRAVENOUS | Status: AC
  Administered 2019-03-12: 2 mg via INTRAVENOUS
  Filled 2019-03-12: qty 1

## 2019-03-12 NOTE — ED Provider Notes (Addendum)
[facility] Emergency Department Provider Note   ____________________________________________    I have reviewed the triage vital signs and the nursing notes.   HISTORY  Chief Complaint Abdominal Pain and Fever     HPI Tyrone Luna is a 73 y.o. male who presents with complaints of abdominal pain.  Patient reports this morning while trying to have a bowel movement he developed significant lower abdominal pain which he describes as cramping and moderate in intensity.  He was unable to have a BM.  No history of abdominal surgery.  He did go to his PCPs office and they sent him to the emergency department because of fever.  He does admit to feeling short of breath over the last 2 to 3 days.  Denies myalgias.  No significant cough.  Past Medical History:  Diagnosis Date  . Arthritis   . BPH associated with nocturia   . ED (erectile dysfunction)   . Erectile dysfunction   . Hyperlipemia   . Hypertension   . Male hypogonadism   . Sleep apnea   . Urinary frequency   . Urinary urgency     Patient Active Problem List   Diagnosis Date Noted  . Morbid obesity due to excess calories ([place]) 05/18/2015  . BPH with obstruction/lower urinary tract symptoms 05/05/2015  . Nocturia 05/05/2015  . Erectile dysfunction of organic origin 05/05/2015  . ED (erectile dysfunction) of organic origin 03/22/2015  . Allergy to environmental factors 03/22/2015  . Hyperlipidemia, unspecified 03/22/2015  . BP (high blood pressure) 03/22/2015  . 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 Apnea, sleep 03/22/2015    Past Surgical History:  Procedure Laterality Date  . EYE SURGERY Bilateral 2002   laser  . INGUINAL HERNIA REPAIR Left   . KNEE SURGERY Right 1995    Prior to Admission medications   Medication Sig Start Date End Date Taking? Authorizing Provider  amLODipine (NORVASC) 10 MG tablet  03/18/15   [provider]  aspirin 81 MG tablet Take 81 mg by mouth daily.     [provider]  augmented betamethasone dipropionate (DIPROLENE-AF) 0.05 % cream Apply topically. 11/27/16   [provider]  cetirizine (ZYRTEC) 10 MG tablet Take 10 mg by mouth daily.    [provider]  dutasteride (AVODART) 0.5 MG capsule Take 1 capsule (0.5 mg total) by mouth daily. 03/17/18   McGowan, Hunt Oris, PA-C  EPIPEN 2-PAK 0.3 MG/0.3ML SOAJ injection  05/03/15   [provider]  fesoterodine (TOVIAZ) 8 MG TB24 tablet Take 1 tablet (8 mg total) by mouth daily. 08/29/16   Zara Council A, PA-C  fluticasone (FLONASE) 50 MCG/ACT nasal spray  08/27/16   [provider]  Glucosamine-Chondroit-Vit C-Mn (GLUCOSAMINE 1500 COMPLEX PO) Take by mouth.    [provider]  loratadine-pseudoephedrine (CLARITIN-D 24-HOUR) 10-240 MG 24 hr tablet Take 1 tablet by mouth daily.    [provider]  Multiple Vitamins-Minerals (PRESERVISION AREDS 2 PO) Take by mouth.    [provider]  omeprazole (PRILOSEC) 40 MG capsule Take 40 mg by mouth daily. 01/22/18   [provider]  pilocarpine (SALAGEN) 5 MG tablet  03/18/15   [provider]  sildenafil (REVATIO) 20 MG tablet Take 3 to 5 tablets two hours before intercouse on an empty stomach.  Do not take with nitrates. 06/04/16   Zara Council A, PA-C  simvastatin (ZOCOR) 20 MG tablet  03/18/15   [provider]  tadalafil (CIALIS) 20 MG tablet  Take 1 tablet (20 mg total) by mouth daily as needed for erectile dysfunction. 03/22/15   Hollice Espy, MD  triamcinolone cream (KENALOG) 0.5 % APPLY TOPICALLY TWO TIMES DAILY FOR UP TO 7 TO 10 DAYS. 03/13/18   [provider]  triamterene-hydrochlorothiazide (DYAZIDE) 37.5-25 MG per capsule  03/18/15   [provider]  Trospium Chloride 60 MG CP24 Take 1 capsule (60 mg total) by mouth daily. 06/04/16   Zara Council A, PA-C     Allergies Patient has no known allergies.  Family History  Problem  Relation Age of Onset  . Hypertension Other   . Prostate cancer Neg Hx   . Bladder Cancer Neg Hx   . Kidney cancer Neg Hx     Social History Social History   Tobacco Use  . Smoking status: Former Research scientist (life sciences)  . Smokeless tobacco: Never Used  . Tobacco comment: quit 50 years ago  Substance Use Topics  . Alcohol use: No    Alcohol/week: 0.0 standard drinks  . Drug use: No    Review of Systems  Constitutional: As above Eyes: No visual changes.  ENT: No sore throat. Cardiovascular: Denies chest pain. Respiratory: As above Gastrointestinal: As above Genitourinary: Negative for dysuria. Musculoskeletal: Negative for back pain. Skin: Negative for rash. Neurological: Negative for headaches   ____________________________________________   PHYSICAL EXAM:  VITAL SIGNS: ED Triage Vitals  Enc Vitals Group     BP 03/12/19 0911 139/89     Pulse Rate 03/12/19 0911 92     Resp 03/12/19 0911 (!) 21     Temp 03/12/19 0911 100 F (37.8 C)     Temp Source 03/12/19 0911 Oral     SpO2 03/12/19 0911 95 %     Weight 03/12/19 0912 120.2 kg (265 lb)     Height 03/12/19 0912 1.854 m (6\' 1" )     Head Circumference --      Peak Flow --      Pain Score 03/12/19 0912 8     Pain Loc --      Pain Edu? --      Excl. in [place]? --     Constitutional: Alert and oriented Eyes: Conjunctivae are normal.   Nose: No congestion/rhinnorhea.  Cardiovascular: Normal rate, regular rhythm.  Good peripheral circulation. Respiratory: Normal respiratory effort.  No retractions.  Gastrointestinal: Mild lower abdominal tenderness, mild distention, no CVA tenderness.  Musculoskeletal: No lower extremity tenderness nor edema.   Neurologic:  Normal speech and language. No gross focal neurologic deficits are appreciated.  Skin:  Skin is warm, dry and intact. No rash noted. Psychiatric: Mood and affect are normal. Speech and behavior are normal.  ____________________________________________   LABS (all labs  ordered are listed, but only abnormal results are displayed)  Labs Reviewed  COMPREHENSIVE METABOLIC PANEL - Abnormal; Notable for the following components:      Result Value   Sodium 134 (*)    CO2 20 (*)    Glucose, Bld 144 (*)    BUN 29 (*)    Total Protein 6.0 (*)    Albumin 3.2 (*)    Total Bilirubin 2.6 (*)    All other components within normal limits  LACTIC ACID, PLASMA - Abnormal; Notable for the following components:   Lactic Acid, Venous 2.9 (*)    All other components within normal limits  CBC WITH DIFFERENTIAL/PLATELET - Abnormal; Notable for the following components:   WBC 17.3 (*)    RBC 6.18 (*)  Hemoglobin 18.8 (*)    HCT 53.7 (*)    Platelets 143 (*)    Neutro Abs 15.7 (*)    All other components within normal limits  URINALYSIS, COMPLETE (UACMP) WITH MICROSCOPIC - Abnormal; Notable for the following components:   Color, Urine YELLOW (*)    APPearance CLEAR (*)    All other components within normal limits  CULTURE, BLOOD (ROUTINE X 2)  CULTURE, BLOOD (ROUTINE X 2)  SARS CORONAVIRUS 2 (HOSPITAL ORDER, [place] [facility])  PROTIME-INR  LACTIC ACID, PLASMA   ____________________________________________  EKG  ED ECG REPORT I, Lavonia Drafts, the attending physician, personally viewed and interpreted this ECG.  Date: 03/12/2019  Rhythm: normal sinus rhythm QRS Axis: normal Intervals: Right bundle branch block ST/T Wave abnormalities: Nonspecific changes l  ____________________________________________  RADIOLOGY  Chest x-ray unremarkable CT scan demonstrates perforated diverticulitis ____________________________________________   PROCEDURES  Procedure(s) performed: No  Procedures   Critical Care performed: yes  CRITICAL CARE Performed by: Lavonia Drafts   Total critical care time: 30 minutes  Critical care time was exclusive of separately billable procedures and treating other patients.  Critical care was  necessary to treat or prevent imminent or life-threatening deterioration.  Critical care was time spent personally by me on the following activities: development of treatment plan with patient and/or surrogate as well as nursing, discussions with consultants, evaluation of patient's response to treatment, examination of patient, obtaining history from patient or surrogate, ordering and performing treatments and interventions, ordering and review of laboratory studies, ordering and review of radiographic studies, pulse oximetry and re-evaluation of patient's condition.  ____________________________________________   INITIAL IMPRESSION / ASSESSMENT AND PLAN / ED COURSE  Pertinent labs & imaging results that were available during my care of the patient were reviewed by me and considered in my medical decision making (see chart for details).  Patient presents with primary complaint of lower abdominal pain, found to be febrile and also short of breath.  Concern for coronavirus, we will send COVID swab, obtain chest x-ray, labs, CT abdomen pelvis and treat with IV morphine and IV Zofran.  Suspicious for SBO versus diverticulitis  CT scan demonstrates perforated diverticulitis with small moderate free air.  IV Zosyn ordered.  Discussed with Dr. Lysle Pearl of surgery he will see and admit the patient    ____________________________________________   FINAL CLINICAL IMPRESSION(S) / ED DIAGNOSES  Final diagnoses:  Perforated diverticulum        Note:  This document was prepared using Dragon voice recognition software and may include unintentional dictation errors.   Lavonia Drafts, MD 03/12/19 1159    Lavonia Drafts, MD 03/12/19 1159

## 2019-03-12 NOTE — H&P (Signed)
Subjective:   CC: acute diverticulitis  HPI:  Tyrone Luna is a 73 y.o. male who is consulted by Corky Downs for evaluation of above cc.  Symptoms were first noted a few days ago. Pain is sharp, localized to lower abdomen.  Associated with constipation, exacerbated by nothing specific.     Past Medical History:  has a past medical history of Arthritis, BPH associated with nocturia, ED (erectile dysfunction), Erectile dysfunction, Hyperlipemia, Hypertension, Male hypogonadism, Sleep apnea, Urinary frequency, and Urinary urgency.  Past Surgical History:  has a past surgical history that includes Eye surgery (Bilateral, 2002); Knee surgery (Right, 1995); and Inguinal hernia repair (Left).  Family History: family history includes Hypertension in an other family member.  Social History:  reports that he has quit smoking. He has never used smokeless tobacco. He reports that he does not drink alcohol or use drugs.  Current Medications:  amLODipine (NORVASC) 10 MG tablet Take 10 mg by mouth daily.  Lysle Pearl, Bryen Hinderman, DO Reordered  Ordered as: amLODipine (NORVASC) tablet 10 mg - 10 mg, Oral, Daily, First dose on Thu 03/12/19 at 1245  aspirin 81 MG tablet Take 81 mg by mouth daily. Lysle Pearl, Laurette Villescas, DO Not Ordered  cetirizine (ZYRTEC) 10 MG tablet Take 10 mg by mouth daily. Lysle Pearl, Miesha Bachmann, DO Reordered  Ordered as: loratadine (CLARITIN) tablet 10 mg - 10 mg, Oral, Daily, First dose on Thu 03/12/19 at 1245  dutasteride (AVODART) 0.5 MG capsule Take 1 capsule (0.5 mg total) by mouth daily. Lysle Pearl, Detrich Rakestraw, DO Reordered  Ordered as: dutasteride (AVODART) capsule 0.5 mg - 0.5 mg, Oral, Daily, First dose on Thu 03/12/19 at 1245  fluticasone (FLONASE) 50 MCG/ACT nasal spray  Lysle Pearl, Rajendra Spiller, DO Reordered  Ordered as: fluticasone (FLONASE) 50 MCG/ACT nasal spray 1 spray - 1 spray, Each Nare, Daily, First dose on Thu 03/12/19 at 1245  Glucosamine-Chondroit-Vit C-Mn (GLUCOSAMINE 1500 COMPLEX PO) Take by mouth. Benjamine Sprague, DO  Not Ordered  Multiple Vitamins-Minerals (PRESERVISION AREDS 2 PO) Take by mouth. Lysle Pearl, Georgi Navarrete, DO Not Ordered  pilocarpine (SALAGEN) 5 MG tablet Take 5 mg by mouth 2 (two) times daily.  Lysle Pearl, Shallon Yaklin, DO Reordered  Ordered as: pilocarpine (SALAGEN) tablet 5 mg - 5 mg, Oral, 2 times daily, First dose on Thu 03/12/19 at 1245  simvastatin (ZOCOR) 20 MG tablet Take 20 mg by mouth daily at 6 PM.  Lysle Pearl, Abdurahman Rugg, DO Reordered  Ordered as: simvastatin (ZOCOR) tablet 20 mg - 20 mg, Oral, Daily-1800, First dose on Thu 03/12/19 at 1800  triamterene-hydrochlorothiazide (DYAZIDE) 37.5-25 MG per capsule Take 1 capsule by mouth daily.  Lysle Pearl, Sherri Levenhagen, DO Reordered  Ordered as: triamterene-hydrochlorothiazide (DYAZIDE) 37.5-25 MG per capsule 1 capsule - 1 capsule, Oral, Daily, First dose on Thu 03/12/19 at 1245  augmented betamethasone dipropionate (DIPROLENE-AF) 0.05 % cream Apply topically. Lysle Pearl, Shantella Blubaugh, DO Not Ordered  EPIPEN 2-PAK 0.3 MG/0.3ML SOAJ injection  Lelani Garnett, DO Not Ordered  loratadine-pseudoephedrine (CLARITIN-D 24-HOUR) 10-240 MG 24 hr tablet Take 1 tablet by mouth daily. Lysle Pearl, Reid Regas, DO Not Ordered  omeprazole (PRILOSEC) 40 MG capsule Take 40 mg by mouth daily. Lysle Pearl, Ila Landowski, DO Not Ordered  sildenafil (REVATIO) 20 MG tablet Take 3 to 5 tablets two hours before intercouse on an empty stomach. Do not take with nitrates. Lysle Pearl, Pearl Berlinger, DO Not Ordered  tadalafil (CIALIS) 20 MG tablet Take 1 tablet (20 mg total) by mouth daily as needed for erectile dysfunction. Lysle Pearl, Murphy Bundick, DO Not Ordered  triamcinolone cream (KENALOG) 0.5 % APPLY TOPICALLY TWO TIMES  DAILY FOR UP TO 7 TO 10 DAYS. Lysle Pearl, Lynnae Ludemann, DO Not Ordered  Trospium Chloride 60 MG CP24 Take 1 capsule (60 mg total) by mouth daily. Benjamine Sprague, DO Not Ordered   Patient not taking: Reported on 03/12/2019       Allergies:  Allergies as of 03/12/2019  . (No Known Allergies)    ROS:  General: Denies weight loss, weight gain, fatigue, fevers,  chills, and night sweats. Eyes: Denies blurry vision, double vision, eye pain, itchy eyes, and tearing. Ears: Denies hearing loss, earache, and ringing in ears. Nose: Denies sinus pain, congestion, infections, runny nose, and nosebleeds. Mouth/throat: Denies hoarseness, sore throat, bleeding gums, and difficulty swallowing. Heart: Denies chest pain, palpitations, racing heart, irregular heartbeat, leg pain or swelling, and decreased activity tolerance. Respiratory: Denies breathing difficulty, shortness of breath, wheezing, cough, and sputum. GI: Denies change in appetite, heartburn, nausea, vomiting, diarrhea, and blood in stool. GU: Denies difficulty urinating, pain with urinating, urgency, frequency, blood in urine. Musculoskeletal: Denies joint stiffness, pain, swelling, muscle weakness. Skin: Denies rash, itching, mass, tumors, sores, and boils Neurologic: Denies headache, fainting, dizziness, seizures, numbness, and tingling. Psychiatric: Denies depression, anxiety, difficulty sleeping, and memory loss. Endocrine: Denies heat or cold intolerance, and increased thirst or urination. Blood/lymph: Denies easy bruising, easy bruising, and swollen glands     Objective:     BP (!) 145/80   Pulse 95   Temp 100 F (37.8 C) (Oral)   Resp (!) 29   Ht 6\' 1"  (1.854 m)   Wt 120.2 kg   SpO2 93%   BMI 34.96 kg/m    Constitutional :  alert, cooperative, appears stated age and no distress  Lymphatics/Throat:  no asymmetry, masses, or scars  Respiratory:  clear to auscultation bilaterally  Cardiovascular:  regular rate and rhythm  Gastrointestinal: soft, no guarding, TTP bilateral lower quadrants.   Musculoskeletal: Steady movement  Skin: Cool and moist, bilateral LE pitting edema.  Psychiatric: Normal affect, non-agitated, not confused       LABS:  CMP Latest Ref Rng & Units 03/12/2019 03/01/2019 07/14/2013  Glucose 70 - 99 mg/dL 144(H) - -  BUN 8 - 23 mg/dL 29(H) - -  Creatinine 0.61  - 1.24 mg/dL 1.08 1.00 -  Sodium 135 - 145 mmol/L 134(L) - -  Potassium 3.5 - 5.1 mmol/L 3.5 - 3.6  Chloride 98 - 111 mmol/L 101 - -  CO2 22 - 32 mmol/L 20(L) - -  Calcium 8.9 - 10.3 mg/dL 9.0 - -  Total Protein 6.5 - 8.1 g/dL 6.0(L) - -  Total Bilirubin 0.3 - 1.2 mg/dL 2.6(H) - -  Alkaline Phos 38 - 126 U/L 42 - -  AST 15 - 41 U/L 26 - -  ALT 0 - 44 U/L 41 - -   CBC Latest Ref Rng & Units 03/12/2019  WBC 4.0 - 10.5 K/uL 17.3(H)  Hemoglobin 13.0 - 17.0 g/dL 18.8(H)  Hematocrit 39.0 - 52.0 % 53.7(H)  Platelets 150 - 400 K/uL 143(L)     RADS: CLINICAL DATA:  Generalized abdominal pain and distension beginning this morning. Fever.  EXAM: CT ABDOMEN AND PELVIS WITH CONTRAST  TECHNIQUE: Multidetector CT imaging of the abdomen and pelvis was performed using the standard protocol following bolus administration of intravenous contrast.  CONTRAST:  140mL OMNIPAQUE IOHEXOL 300 MG/ML  SOLN  COMPARISON:  None.  FINDINGS: Lower Chest: No acute findings.  Hepatobiliary: No hepatic masses identified. A few tiny gallstones are noted, however there is no  evidence of cholecystitis or biliary ductal dilatation.  Pancreas:  No mass or inflammatory changes.  Spleen: Within normal limits in size and appearance.  Adrenals/Urinary Tract: No masses identified. A few tiny renal cysts are noted. No evidence of hydronephrosis.  Stomach/Bowel: Moderate diverticulitis is seen involving the sigmoid colon. A extraluminal gas collection is seen in the adjacent sigmoid mesocolon which measures 4 cm, however no fluid collection is seen. A tiny amount of free intraperitoneal air is also seen, consistent with bowel perforation.  Vascular/Lymphatic: No pathologically enlarged lymph nodes. No abdominal aortic aneurysm. Aortic atherosclerosis.  Reproductive:  No mass or other significant abnormality.  Other:  None.  Musculoskeletal:  No suspicious bone lesions  identified.  IMPRESSION: 1. Perforated sigmoid diverticulitis, with tiny amount of free intraperitoneal air. 2. Cholelithiasis. No radiographic evidence of cholecystitis.  Critical Value/emergent results were called by telephone at the time of interpretation on 03/12/2019 at 11:35 am to Dr. Lavonia Drafts , who verbally acknowledged these results.  Aortic Atherosclerosis (ICD10-I70.0).   Electronically Signed   By: Marlaine Hind M.D.   On: 03/12/2019 11:41 Assessment:      Acute sigmoid diverticulitis, no abscess OSA HTN HLD  Plan:     Abdominal exam stable, no frank periotonitis, with CT only with microperf.  Will admit with IVF, zosyn, NPO, and monitor labs with serial abdominal exams.  Briefly discussed pathophysiology of diverticulitis, possibility of surgery depending on how patient responds to abx.  OSA, HTN, HLD- continue home meds.  Clinically significant BLE pitting edema, patient states it is chronic with no worsening.  Minor cardiac silhouette enlargement also noted on CXR.  Will monitor fluid status closely.  Also pending COVID testing.

## 2019-03-12 NOTE — ED Notes (Signed)
Pt updated on plan. No complains at this time

## 2019-03-12 NOTE — ED Notes (Signed)
Dr Corky Downs notified in person of LA increasing to 3.3

## 2019-03-12 NOTE — ED Notes (Signed)
ED TO INPATIENT HANDOFF REPORT  ED Nurse Name and Phone #: bill [phone]  S Name/Age/Gender Tyrone Luna 73 y.o. male Room/Bed: ED02A/ED02A  Code Status   Code Status: Full Code  Home/SNF/Other Home Patient oriented to: self, place, time and situation Is this baseline? Yes   Triage Complete: Triage complete  Chief Complaint abd pain fever  Triage Note Pt reports he woke up this am with generalized abd pain, he states that he tried to have a BM and the pain worsened. Pt reports that he went to [facility] and was told that he has a fever. Pt denies NVD.    Allergies No Known Allergies  Level of Care/Admitting Diagnosis ED Disposition    ED Disposition Condition [facility] Area: [place] [[id number]]  Level of Care: Med-Surg [16]  Covid Evaluation: Confirmed COVID Negative  Diagnosis: Diverticulitis large intestine [[id number]]  Admitting Physician: Benjamine Sprague [[id number]]  Attending Physician: Benjamine Sprague [[id number]]  Estimated length of stay: 3 - 4 days  Certification:: I certify this patient will need inpatient services for at least 2 midnights  PT Class (Do Not Modify): Inpatient [101]  PT Acc Code (Do Not Modify): Private [1]       B Medical/Surgery History Past Medical History:  Diagnosis Date  . Arthritis   . BPH associated with nocturia   . ED (erectile dysfunction)   . Erectile dysfunction   . Hyperlipemia   . Hypertension   . Male hypogonadism   . Sleep apnea   . Urinary frequency   . Urinary urgency    Past Surgical History:  Procedure Laterality Date  . EYE SURGERY Bilateral 2002   laser  . INGUINAL HERNIA REPAIR Left   . KNEE SURGERY Right 1995     A IV Location/Drains/Wounds Patient Lines/Drains/Airways Status   Active Line/Drains/Airways    Name:   Placement date:   Placement time:   Site:   Days:   Peripheral IV 03/12/19 Left Hand   03/12/19    1014    Hand   less than 1   Peripheral IV 03/12/19 Right  Antecubital   03/12/19    1014    Antecubital   less than 1          Intake/Output Last 24 hours  Intake/Output Summary (Last 24 hours) at 03/12/2019 1435 Last data filed at 03/12/2019 1434 Gross per 24 hour  Intake 50 ml  Output -  Net 50 ml    Labs/Imaging Results for orders placed or performed during the hospital encounter of 03/12/19 (from the past 48 hour(s))  Comprehensive metabolic panel     Status: Abnormal   Collection Time: 03/12/19  9:16 AM  Result Value Ref Range   Sodium 134 (L) 135 - 145 mmol/L   Potassium 3.5 3.5 - 5.1 mmol/L   Chloride 101 98 - 111 mmol/L   CO2 20 (L) 22 - 32 mmol/L   Glucose, Bld 144 (H) 70 - 99 mg/dL   BUN 29 (H) 8 - 23 mg/dL   Creatinine, Ser 1.08 0.61 - 1.24 mg/dL   Calcium 9.0 8.9 - 10.3 mg/dL   Total Protein 6.0 (L) 6.5 - 8.1 g/dL   Albumin 3.2 (L) 3.5 - 5.0 g/dL   AST 26 15 - 41 U/L   ALT 41 0 - 44 U/L   Alkaline Phosphatase 42 38 - 126 U/L   Total Bilirubin 2.6 (H) 0.3 - 1.2 mg/dL   GFR calc non  Af Amer >60 >60 mL/min   GFR calc Af Amer >60 >60 mL/min   Anion gap 13 5 - 15    Comment: Performed at [facility], [address] [place], [place] [address]  CBC with Differential     Status: Abnormal   Collection Time: 03/12/19  9:16 AM  Result Value Ref Range   WBC 17.3 (H) 4.0 - 10.5 K/uL   RBC 6.18 (H) 4.22 - 5.81 MIL/uL   Hemoglobin 18.8 (H) 13.0 - 17.0 g/dL   HCT 53.7 (H) 39.0 - 52.0 %   MCV 86.9 80.0 - 100.0 fL   MCH 30.4 26.0 - 34.0 pg   MCHC 35.0 30.0 - 36.0 g/dL   RDW 13.8 11.5 - 15.5 %   Platelets 143 (L) 150 - 400 K/uL   nRBC 0.0 0.0 - 0.2 %   Neutrophils Relative % 86 %   Neutro Abs 15.7 (H) 1.7 - 7.7 K/uL   Band Neutrophils 5 %   Lymphocytes Relative 5 %   Lymphs Abs 0.9 0.7 - 4.0 K/uL   Monocytes Relative 3 %   Monocytes Absolute 0.5 0.1 - 1.0 K/uL   Eosinophils Relative 1 %   Eosinophils Absolute 0.2 0.0 - 0.5 K/uL   Basophils Relative 0 %   Basophils Absolute 0.0 0.0 - 0.1 K/uL   WBC Morphology  MORPHOLOGY UNREMARKABLE    RBC Morphology MORPHOLOGY UNREMARKABLE    Smear Review PLATELET CLUMPING SEEN ON SMEAR    Abs Immature Granulocytes 0.00 0.00 - 0.07 K/uL    Comment: Performed at [facility], [address] [place], [place] [address]  Protime-INR     Status: None   Collection Time: 03/12/19  9:16 AM  Result Value Ref Range   Prothrombin Time 13.4 11.4 - 15.2 seconds   INR 1.0 0.8 - 1.2    Comment: (NOTE) INR goal varies based on device and disease states. Performed at [facility], [address] [place], [place] [address]   Lactic acid, plasma     Status: Abnormal   Collection Time: 03/12/19  9:17 AM  Result Value Ref Range   Lactic Acid, Venous 2.9 (HH) 0.5 - 1.9 mmol/L    Comment: CRITICAL RESULT CALLED TO, READ BACK BY AND VERIFIED WITH BILL Jazmarie Biever AT 1032 ON 03/12/2019 JJB Performed at [facility], [address] [place], [place] [address]   Urinalysis, Complete w Microscopic     Status: Abnormal   Collection Time: 03/12/19  9:41 AM  Result Value Ref Range   Color, Urine YELLOW (A) YELLOW   APPearance CLEAR (A) CLEAR   Specific Gravity, Urine 1.012 1.005 - 1.030   pH 6.0 5.0 - 8.0   Glucose, UA NEGATIVE NEGATIVE mg/dL   Hgb urine dipstick NEGATIVE NEGATIVE   Bilirubin Urine NEGATIVE NEGATIVE   Ketones, ur NEGATIVE NEGATIVE mg/dL   Protein, ur NEGATIVE NEGATIVE mg/dL   Nitrite NEGATIVE NEGATIVE   Leukocytes,Ua NEGATIVE NEGATIVE   RBC / HPF 0-5 0 - 5 RBC/hpf   WBC, UA 0-5 0 - 5 WBC/hpf   Bacteria, UA NONE SEEN NONE SEEN   Squamous Epithelial / LPF NONE SEEN 0 - 5   Mucus PRESENT     Comment: Performed at [facility], [address], [place],  [address]  SARS Coronavirus 2 (CEPHEID- Performed in [place] hospital lab), Hosp Order     Status: None   Collection Time: 03/12/19  9:41 AM   Specimen: Nasopharyngeal Swab  Result Value Ref Range   SARS Coronavirus 2 NEGATIVE NEGATIVE    Comment: (NOTE) If  result is NEGATIVE SARS-CoV-2 target nucleic acids are NOT DETECTED. The SARS-CoV-2 RNA is generally detectable in upper and lower  respiratory specimens during the acute phase of infection. The lowest  concentration of SARS-CoV-2 viral copies this assay can detect is 250  copies / mL. A negative result does not preclude SARS-CoV-2 infection  and should not be used as the sole basis for treatment or other  patient management decisions.  A negative result may occur with  improper specimen collection / handling, submission of specimen other  than nasopharyngeal swab, presence of viral mutation(s) within the  areas targeted by this assay, and inadequate number of viral copies  (<250 copies / mL). A negative result must be combined with clinical  observations, patient history, and epidemiological information. If result is POSITIVE SARS-CoV-2 target nucleic acids are DETECTED. The SARS-CoV-2 RNA is generally detectable in upper and lower  respiratory specimens dur ing the acute phase of infection.  Positive  results are indicative of active infection with SARS-CoV-2.  Clinical  correlation with patient history and other diagnostic information is  necessary to determine patient infection status.  Positive results do  not rule out bacterial infection or co-infection with other viruses. If result is PRESUMPTIVE POSTIVE SARS-CoV-2 nucleic acids MAY BE PRESENT.   A presumptive positive result was obtained on the submitted specimen  and confirmed on repeat testing.  While 2019 novel coronavirus  (SARS-CoV-2) nucleic acids may be present in the submitted sample  additional confirmatory testing may be necessary for epidemiological  and / or clinical management purposes  to differentiate between  SARS-CoV-2 and other Sarbecovirus currently known to infect humans.  If clinically indicated additional testing with an alternate test  methodology [phone]) is advised. The SARS-CoV-2 RNA is generally   detectable in upper and lower respiratory sp ecimens during the acute  phase of infection. The expected result is Negative. Fact Sheet for Patients:  StrictlyIdeas.no Fact Sheet for Healthcare Providers: BankingDealers.co.za This test is not yet approved or cleared by the [place] FDA and has been authorized for detection and/or diagnosis of SARS-CoV-2 by FDA under an Emergency Use Authorization (EUA).  This EUA will remain in effect (meaning this test can be used) for the duration of the COVID-19 declaration under Section 564(b)(1) of the Act, 21 U.S.C. section 360bbb-3(b)(1), unless the authorization is terminated or revoked sooner. Performed at [facility], [address] [place], [place] [address]   Lactic acid, plasma     Status: Abnormal   Collection Time: 03/12/19 11:51 AM  Result Value Ref Range   Lactic Acid, Venous 3.3 (HH) 0.5 - 1.9 mmol/L    Comment: CRITICAL RESULT CALLED TO, READ BACK BY AND VERIFIED WITH BILL Andriy Sherk AT 1219 ON 03/12/2019 JJB Performed at [facility], [address] [place], [place] [address]    Ct Abdomen Pelvis W Contrast  Result Date: 03/12/2019 CLINICAL DATA:  Generalized abdominal pain and distension beginning this morning. Fever. EXAM: CT ABDOMEN AND PELVIS WITH CONTRAST TECHNIQUE: Multidetector CT imaging of the abdomen and pelvis was performed using the standard protocol following bolus administration of intravenous contrast. CONTRAST:  194mL OMNIPAQUE IOHEXOL 300 MG/ML  SOLN COMPARISON:  None. FINDINGS: Lower Chest: No acute findings. Hepatobiliary: No hepatic masses identified. A few tiny gallstones are noted, however there is no evidence of cholecystitis or biliary ductal dilatation. Pancreas:  No mass or inflammatory  changes. Spleen: Within normal limits in size and appearance. Adrenals/Urinary Tract: No masses identified. A few tiny renal cysts are noted. No evidence of  hydronephrosis. Stomach/Bowel: Moderate diverticulitis is seen involving the sigmoid colon. A extraluminal gas collection is seen in the adjacent sigmoid mesocolon which measures 4 cm, however no fluid collection is seen. A tiny amount of free intraperitoneal air is also seen, consistent with bowel perforation. Vascular/Lymphatic: No pathologically enlarged lymph nodes. No abdominal aortic aneurysm. Aortic atherosclerosis. Reproductive:  No mass or other significant abnormality. Other:  None. Musculoskeletal:  No suspicious bone lesions identified. IMPRESSION: 1. Perforated sigmoid diverticulitis, with tiny amount of free intraperitoneal air. 2. Cholelithiasis. No radiographic evidence of cholecystitis. Critical Value/emergent results were called by telephone at the time of interpretation on 03/12/2019 at 11:35 am to Dr. Lavonia Drafts , who verbally acknowledged these results. Aortic Atherosclerosis (ICD10-I70.0). Electronically Signed   By: Marlaine Hind M.D.   On: 03/12/2019 11:41   Dg Chest Port 1 View  Result Date: 03/12/2019 CLINICAL DATA:  Sepsis. EXAM: PORTABLE CHEST 1 VIEW COMPARISON:  None. FINDINGS: The cardiac silhouette is mildly enlarged. Calcific atherosclerotic disease and tortuosity of the aorta. Mediastinal contours appear intact. There is no evidence of focal airspace consolidation, pleural effusion or pneumothorax. Osseous structures are without acute abnormality. Soft tissues are grossly normal. IMPRESSION: 1. Mildly enlarged cardiac silhouette. 2. Calcific atherosclerotic disease and tortuosity of the aorta. Electronically Signed   By: Fidela Salisbury M.D.   On: 03/12/2019 10:07    Pending Labs Unresulted Labs (From admission, onward)    Start     Ordered   03/13/19 2956  Basic metabolic panel  Daily,   STAT     03/12/19 1328   03/13/19 0500  Magnesium  Daily,   STAT     03/12/19 1328   03/13/19 0500  Phosphorus  Daily,   STAT     03/12/19 1328   03/13/19 0500  CBC  Daily,    STAT     03/12/19 1328   03/12/19 0915  Culture, blood (Routine x 2)  BLOOD CULTURE X 2,   STAT     03/12/19 0914          Vitals/Pain Today's Vitals   03/12/19 1115 03/12/19 1130 03/12/19 1156 03/12/19 1312  BP:  (!) 145/80    Pulse: 99 95    Resp: (!) 26 (!) 29    Temp:      TempSrc:      SpO2: 94% 93%    Weight:      Height:      PainSc:   2  3     Isolation Precautions No active isolations  Medications Medications  pilocarpine (SALAGEN) tablet 5 mg (has no administration in time range)  dutasteride (AVODART) capsule 0.5 mg (has no administration in time range)  triamterene-hydrochlorothiazide (DYAZIDE) 37.5-25 MG per capsule 1 capsule (has no administration in time range)  amLODipine (NORVASC) tablet 10 mg (has no administration in time range)  simvastatin (ZOCOR) tablet 20 mg (has no administration in time range)  loratadine (CLARITIN) tablet 10 mg (has no administration in time range)  fluticasone (FLONASE) 50 MCG/ACT nasal spray 1 spray (has no administration in time range)  morphine 2 MG/ML injection 1 mg (has no administration in time range)  docusate sodium (COLACE) capsule 100 mg (has no administration in time range)  ondansetron (ZOFRAN-ODT) disintegrating tablet 4 mg (has no administration in time range)    Or  ondansetron (ZOFRAN) injection  4 mg (has no administration in time range)  enoxaparin (LOVENOX) injection 40 mg (has no administration in time range)  lactated ringers infusion (has no administration in time range)  piperacillin-tazobactam (ZOSYN) IVPB 3.375 g (has no administration in time range)  pantoprazole (PROTONIX) injection 40 mg (has no administration in time range)  morphine 2 MG/ML injection 2 mg (2 mg Intravenous Given 03/12/19 0954)  ondansetron (ZOFRAN) injection 4 mg (4 mg Intravenous Given 03/12/19 0953)  morphine 2 MG/ML injection 2 mg (2 mg Intravenous Given 03/12/19 1032)  iohexol (OMNIPAQUE) 300 MG/ML solution 100 mL (100 mLs  Intravenous Contrast Given 03/12/19 1104)  piperacillin-tazobactam (ZOSYN) IVPB 3.375 g (0 g Intravenous Stopped 03/12/19 1434)    Mobility walks with person assist Low fall risk   Focused Assessments 1   R Recommendations: See Admitting Provider Note  Report given to:   Additional Notes:

## 2019-03-12 NOTE — ED Triage Notes (Signed)
Pt reports he woke up this am with generalized abd pain, he states that he tried to have a BM and the pain worsened. Pt reports that he went to [facility] and was told that he has a fever. Pt denies NVD.

## 2019-03-13 LAB — BASIC METABOLIC PANEL
Anion gap: 9 (ref 5–15)
BUN: 27 mg/dL — ABNORMAL HIGH (ref 8–23)
CO2: 25 mmol/L (ref 22–32)
Calcium: 8.2 mg/dL — ABNORMAL LOW (ref 8.9–10.3)
Chloride: 102 mmol/L (ref 98–111)
Creatinine, Ser: 1.2 mg/dL (ref 0.61–1.24)
GFR calc Af Amer: >60 mL/min (ref >60)
GFR calc non Af Amer: >60 mL/min (ref >60)
Glucose, Bld: 151 mg/dL — ABNORMAL HIGH (ref 70–99)
Potassium: 4.2 mmol/L (ref 3.5–5.1)
Sodium: 136 mmol/L (ref 135–145)

## 2019-03-13 LAB — MAGNESIUM: Magnesium: 2.3 mg/dL (ref 1.7–2.4)

## 2019-03-13 LAB — CBC
HCT: 52.1 % — ABNORMAL HIGH (ref 39.0–52.0)
Hemoglobin: 17.2 g/dL — ABNORMAL HIGH (ref 13.0–17.0)
MCH: 30.4 pg (ref 26.0–34.0)
MCHC: 33 g/dL (ref 30.0–36.0)
MCV: 92 fL (ref 80.0–100.0)
Platelets: 93 10*3/uL — ABNORMAL LOW (ref 150–400)
RBC: 5.66 MIL/uL (ref 4.22–5.81)
RDW: 14.6 % (ref 11.5–15.5)
WBC: 20.3 10*3/uL — ABNORMAL HIGH (ref 4.0–10.5)
nRBC: 0 % (ref 0.0–0.2)

## 2019-03-13 LAB — PHOSPHORUS: Phosphorus: 4 mg/dL (ref 2.5–4.6)

## 2019-03-13 MED ORDER — KCL IN DEXTROSE-NACL 40-5-0.45 MEQ/L-%-% IV SOLN
INTRAVENOUS | Status: DC
  Administered 2019-03-13 – 2019-03-15 (×3): via INTRAVENOUS
  Filled 2019-03-13 (×4): qty 1000

## 2019-03-13 MED ORDER — KETOROLAC TROMETHAMINE 30 MG/ML IJ SOLN
INTRAMUSCULAR | Status: AC
  Administered 2019-03-13: 12:00:00 30 mg
  Filled 2019-03-13: qty 1

## 2019-03-13 MED ORDER — KETOROLAC TROMETHAMINE 15 MG/ML IJ SOLN
15.0000 mg | Freq: Four times a day (QID) | INTRAMUSCULAR | Status: DC | PRN
  Administered 2019-03-13 – 2019-03-14 (×2): 15 mg via INTRAVENOUS
  Filled 2019-03-13 (×3): qty 1

## 2019-03-13 MED ORDER — ACETAMINOPHEN 325 MG PO TABS
650.0000 mg | ORAL_TABLET | Freq: Four times a day (QID) | ORAL | Status: DC | PRN
  Administered 2019-03-14: 15:00:00 650 mg via ORAL
  Filled 2019-03-13: qty 2

## 2019-03-14 LAB — BASIC METABOLIC PANEL
Anion gap: 8 (ref 5–15)
BUN: 29 mg/dL — ABNORMAL HIGH (ref 8–23)
CO2: 24 mmol/L (ref 22–32)
Calcium: 7.9 mg/dL — ABNORMAL LOW (ref 8.9–10.3)
Chloride: 101 mmol/L (ref 98–111)
Creatinine, Ser: 1.19 mg/dL (ref 0.61–1.24)
GFR calc Af Amer: >60 mL/min (ref >60)
GFR calc non Af Amer: >60 mL/min (ref >60)
Glucose, Bld: 133 mg/dL — ABNORMAL HIGH (ref 70–99)
Potassium: 4 mmol/L (ref 3.5–5.1)
Sodium: 133 mmol/L — ABNORMAL LOW (ref 135–145)

## 2019-03-14 LAB — CBC
HCT: 50.8 % (ref 39.0–52.0)
Hemoglobin: 17 g/dL (ref 13.0–17.0)
MCH: 30.7 pg (ref 26.0–34.0)
MCHC: 33.5 g/dL (ref 30.0–36.0)
MCV: 91.9 fL (ref 80.0–100.0)
Platelets: 86 10*3/uL — ABNORMAL LOW (ref 150–400)
RBC: 5.53 MIL/uL (ref 4.22–5.81)
RDW: 14.4 % (ref 11.5–15.5)
WBC: 17.6 10*3/uL — ABNORMAL HIGH (ref 4.0–10.5)
nRBC: 0 % (ref 0.0–0.2)

## 2019-03-14 LAB — MAGNESIUM: Magnesium: 2.6 mg/dL — ABNORMAL HIGH (ref 1.7–2.4)

## 2019-03-14 LAB — PHOSPHORUS: Phosphorus: 2.9 mg/dL (ref 2.5–4.6)

## 2019-03-14 LAB — GLUCOSE, CAPILLARY: Glucose-Capillary: 68 mg/dL — ABNORMAL LOW (ref 70–99)

## 2019-03-14 MED ORDER — HYDROCODONE-ACETAMINOPHEN 7.5-325 MG PO TABS
1.0000 | ORAL_TABLET | Freq: Four times a day (QID) | ORAL | Status: AC | PRN
  Administered 2019-03-14 – 2019-03-15 (×3): 1 via ORAL
  Filled 2019-03-14 (×3): qty 1

## 2019-03-14 NOTE — Plan of Care (Signed)

## 2019-03-14 NOTE — Progress Notes (Signed)
[facility] Hospital Day(s): 2.   Post op day(s):  Marland Kitchen   Interval History: Patient seen and examined, no acute events or new complaints overnight. Patient reports feeling the same today.  There has not been significant improvement in pain.  Patient states that the pain has not getting worse either.  There is no nausea or vomiting.  There has been no fever or chills.  Vital signs in last 24 hours: [min-max] current  Temp:  [98.3 F (36.8 C)-98.8 F (37.1 C)] 98.5 F (36.9 C) (07/18 0433) Pulse Rate:  [65-69] 65 (07/18 0433) Resp:  [18-20] 19 (07/17 2047) BP: (128-136)/(68-75) 132/73 (07/18 0433) SpO2:  [92 %-93 %] 93 % (07/18 0433)     Height: 6\' 1"  (185.4 cm) Weight: 120.2 kg BMI (Calculated): 34.97   Physical Exam:  Constitutional: alert, cooperative and no distress  Respiratory: breathing non-labored at rest  Cardiovascular: regular rate and sinus rhythm  Gastrointestinal: soft, tender on right lower quadrant and suprapubic area, and non-distended  Labs:  CBC Latest Ref Rng & Units 03/14/2019 03/13/2019 03/12/2019  WBC 4.0 - 10.5 K/uL 17.6(H) 20.3(H) 17.3(H)  Hemoglobin 13.0 - 17.0 g/dL 17.0 17.2(H) 18.8(H)  Hematocrit 39.0 - 52.0 % 50.8 52.1(H) 53.7(H)  Platelets 150 - 400 K/uL 86(L) 93(L) 143(L)   CMP Latest Ref Rng & Units 03/14/2019 03/13/2019 03/12/2019  Glucose 70 - 99 mg/dL 133(H) 151(H) 144(H)  BUN 8 - 23 mg/dL 29(H) 27(H) 29(H)  Creatinine 0.61 - 1.24 mg/dL 1.19 1.20 1.08  Sodium 135 - 145 mmol/L 133(L) 136 134(L)  Potassium 3.5 - 5.1 mmol/L 4.0 4.2 3.5  Chloride 98 - 111 mmol/L 101 102 101  CO2 22 - 32 mmol/L 24 25 20(L)  Calcium 8.9 - 10.3 mg/dL 7.9(L) 8.2(L) 9.0  Total Protein 6.5 - 8.1 g/dL - - 6.0(L)  Total Bilirubin 0.3 - 1.2 mg/dL - - 2.6(H)  Alkaline Phos 38 - 126 U/L - - 42  AST 15 - 41 U/L - - 26  ALT 0 - 44 U/L - - 41    Imaging studies: No new pertinent imaging studies   Assessment/Plan:  73 y.o. male with acute diverticulitis,  complicated by pertinent comorbidities including hypertension, hyperlipidemia. Patient today without much clinical progress but there is no worsening of abdominal pain.  Patient has not had any fever in the last 24 hours.  The white blood cell count is slowly trending down.  I discussed with the patient that we can continue IV antibiotic therapy to try to control the infection and avoid urgent surgical management which will means that he will need a partial colectomy with end colostomy.  The patient at this moment even though the pain has not improved since yesterday he is not in severe pain and will want to continue trying medical management.  He understand that if he does not progress even though the labs are getting better or if the pain gets worse or he start doing fevers he might need surgical intubation during this admission and will means removing the portion of the intestine with a diverticulitis and an end colostomy.  If he responds to IV antibiotics, can consider later an interval colectomy with anastomosis.  Since the platelet has been decreasing, will hold Lovenox and follow the trend.  Will place sequential compression devices.  Will discontinue Toradol change to Norco.  Arnold Long, MD

## 2019-03-14 NOTE — Progress Notes (Signed)
Subjective:  CC: Tyrone Luna is a 73 y.o. male  Hospital stay day 2,   acute diverticulitis   HPI: Pain slightly better today.    ROS:  General: Denies weight loss, weight gain, fatigue, fevers, chills, and night sweats. Heart: Denies chest pain, palpitations, racing heart, irregular heartbeat, leg pain or swelling, and decreased activity tolerance. Respiratory: Denies breathing difficulty, shortness of breath, wheezing, cough, and sputum. GI: Denies change in appetite, heartburn, nausea, vomiting, constipation, diarrhea, and blood in stool. GU: Denies difficulty urinating, pain with urinating, urgency, frequency, blood in urine.   Objective:   Temp:  [98.3 F (36.8 C)-98.8 F (37.1 C)] 98.5 F (36.9 C) (07/18 0433) Pulse Rate:  [65-69] 65 (07/18 0433) Resp:  [18-20] 19 (07/17 2047) BP: (128-136)/(68-75) 132/73 (07/18 0433) SpO2:  [92 %-93 %] 93 % (07/18 0433)     Height: 6\' 1"  (185.4 cm) Weight: 120.2 kg BMI (Calculated): 34.97   Intake/Output this shift:   Intake/Output Summary (Last 24 hours) at 03/14/2019 0930 Last data filed at 03/14/2019 1779 Gross per 24 hour  Intake 901.87 ml  Output 600 ml  Net 301.87 ml    Constitutional :  alert, cooperative and appears stated age  Respiratory:  clear to auscultation bilaterally  Cardiovascular:  regular rate and rhythm  Gastrointestinal: soft, no guarding, but still TTP suprapubic region.   Skin: Cool and moist.   Psychiatric: Normal affect, non-agitated, not confused       LABS:  Wbc- 20.3   RADS: n/a Assessment:   Acute diverticulitis, exam stable but wbc increased today.  Will continue to monitor, keep zosyn, keep npo with ice chips only for today.  Can consider advancing diet once wbc improves

## 2019-03-15 ENCOUNTER — Inpatient Hospital Stay: Payer: Medicare HMO | Admitting: Registered Nurse

## 2019-03-15 ENCOUNTER — Encounter
Admission: EM | Disposition: A | Discharge status: Home Health | Payer: Self-pay | Source: Home / Self Care | Attending: Surgery

## 2019-03-15 ENCOUNTER — Inpatient Hospital Stay: Payer: Medicare HMO

## 2019-03-15 DIAGNOSIS — K5720 Diverticulitis of large intestine with perforation and abscess without bleeding: Secondary | ICD-10-CM

## 2019-03-15 DIAGNOSIS — K5780 Diverticulitis of intestine, part unspecified, with perforation and abscess without bleeding: Secondary | ICD-10-CM

## 2019-03-15 HISTORY — PX: LAPAROTOMY: SHX154

## 2019-03-15 LAB — CBC
HCT: 51.1 % (ref 39.0–52.0)
Hemoglobin: 17.2 g/dL — ABNORMAL HIGH (ref 13.0–17.0)
MCH: 30.6 pg (ref 26.0–34.0)
MCHC: 33.7 g/dL (ref 30.0–36.0)
MCV: 90.9 fL (ref 80.0–100.0)
Platelets: 94 10*3/uL — ABNORMAL LOW (ref 150–400)
RBC: 5.62 MIL/uL (ref 4.22–5.81)
RDW: 14.4 % (ref 11.5–15.5)
WBC: 13.2 10*3/uL — ABNORMAL HIGH (ref 4.0–10.5)
nRBC: 0 % (ref 0.0–0.2)

## 2019-03-15 LAB — PHOSPHORUS: Phosphorus: 2.9 mg/dL (ref 2.5–4.6)

## 2019-03-15 LAB — BASIC METABOLIC PANEL
Anion gap: 11 (ref 5–15)
BUN: 30 mg/dL — ABNORMAL HIGH (ref 8–23)
CO2: 25 mmol/L (ref 22–32)
Calcium: 8.4 mg/dL — ABNORMAL LOW (ref 8.9–10.3)
Chloride: 100 mmol/L (ref 98–111)
Creatinine, Ser: 1.2 mg/dL (ref 0.61–1.24)
GFR calc Af Amer: >60 mL/min (ref >60)
GFR calc non Af Amer: >60 mL/min (ref >60)
Glucose, Bld: 105 mg/dL — ABNORMAL HIGH (ref 70–99)
Potassium: 4.3 mmol/L (ref 3.5–5.1)
Sodium: 136 mmol/L (ref 135–145)

## 2019-03-15 LAB — MAGNESIUM: Magnesium: 2.6 mg/dL — ABNORMAL HIGH (ref 1.7–2.4)

## 2019-03-15 IMAGING — CR DG ABDOMEN ACUTE W/ 1V CHEST
4 series · 5 of 5 positions shown · non-contrast
Comparison: CT scan and radiograph f [DATE].

CLINICAL DATA: Diverticulitis.

EXAM:
DG ABDOMEN ACUTE W/ 1V CHEST

[chest pa]
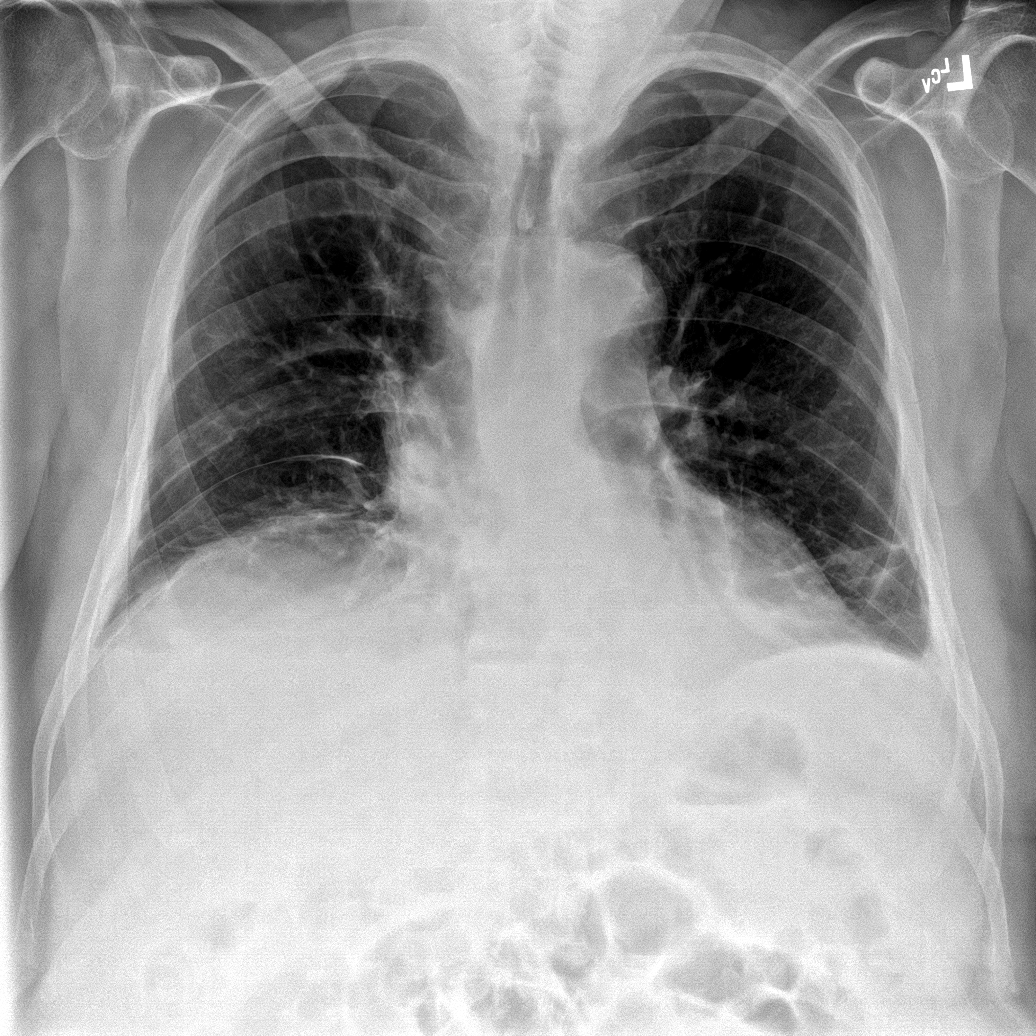

[abdomen erect]
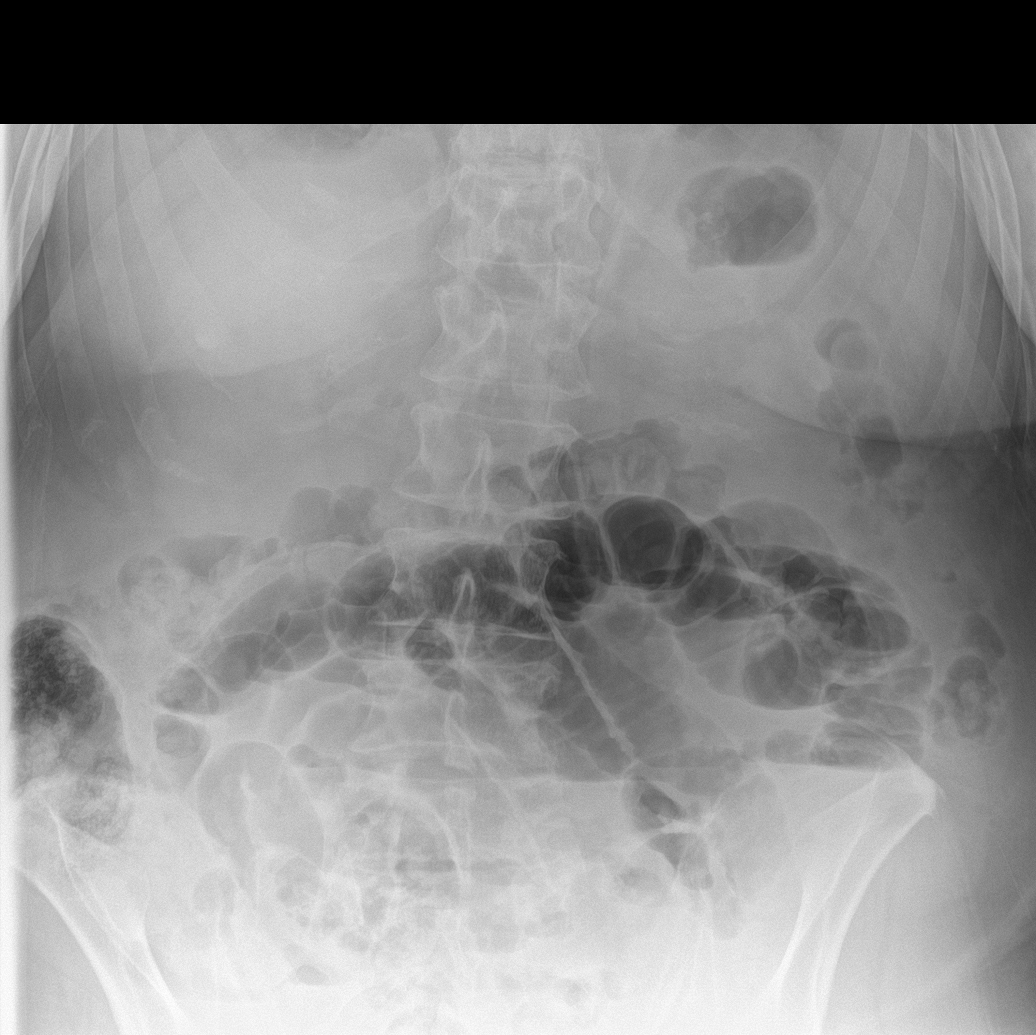

[abdomen kub (1 of 2)]
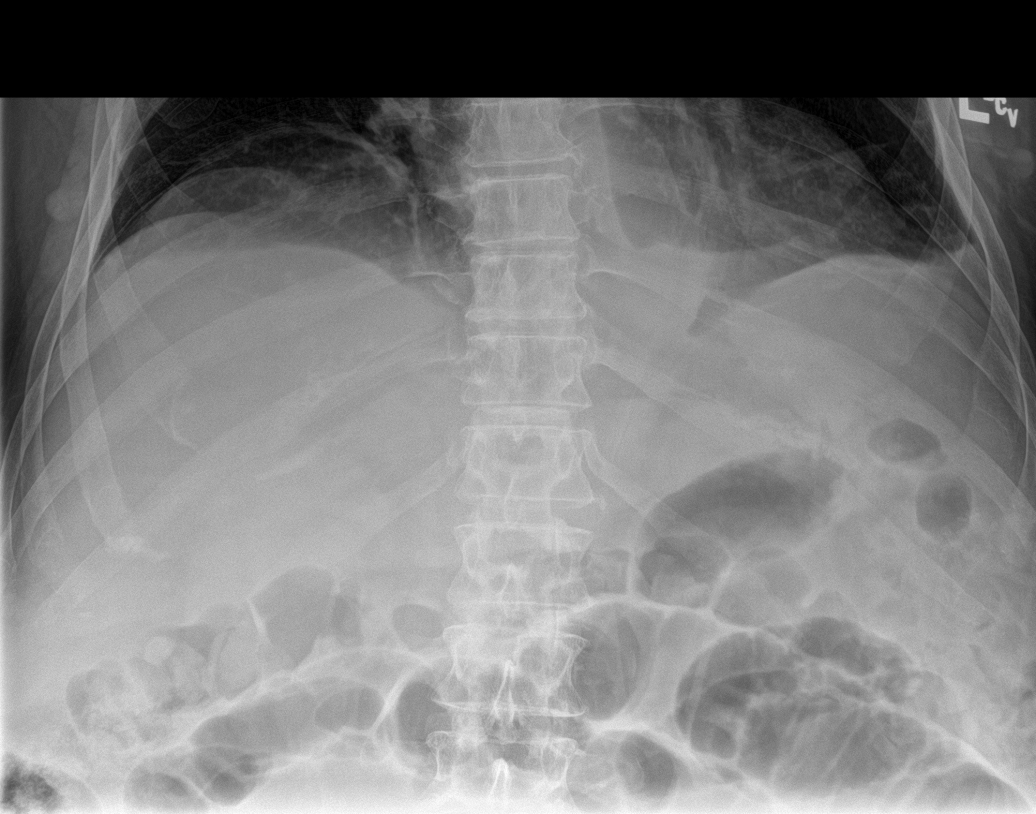

[Series 4: abdomen kub · 0.14mm/px · 2 of 2 slices shown (2 of 2)]
[im 1/2]
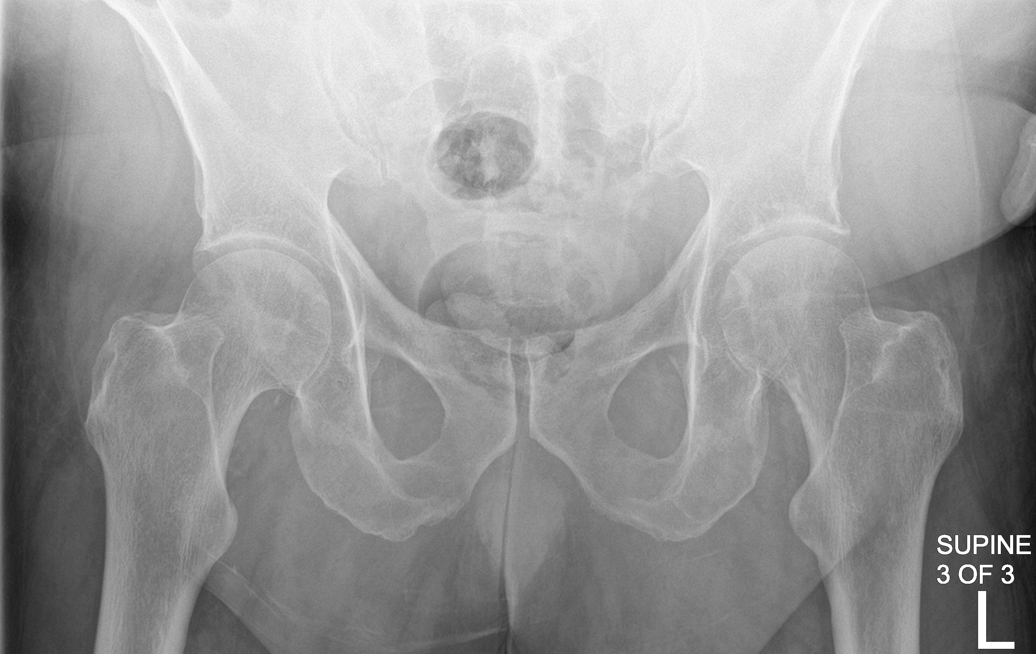
[im 2/2]
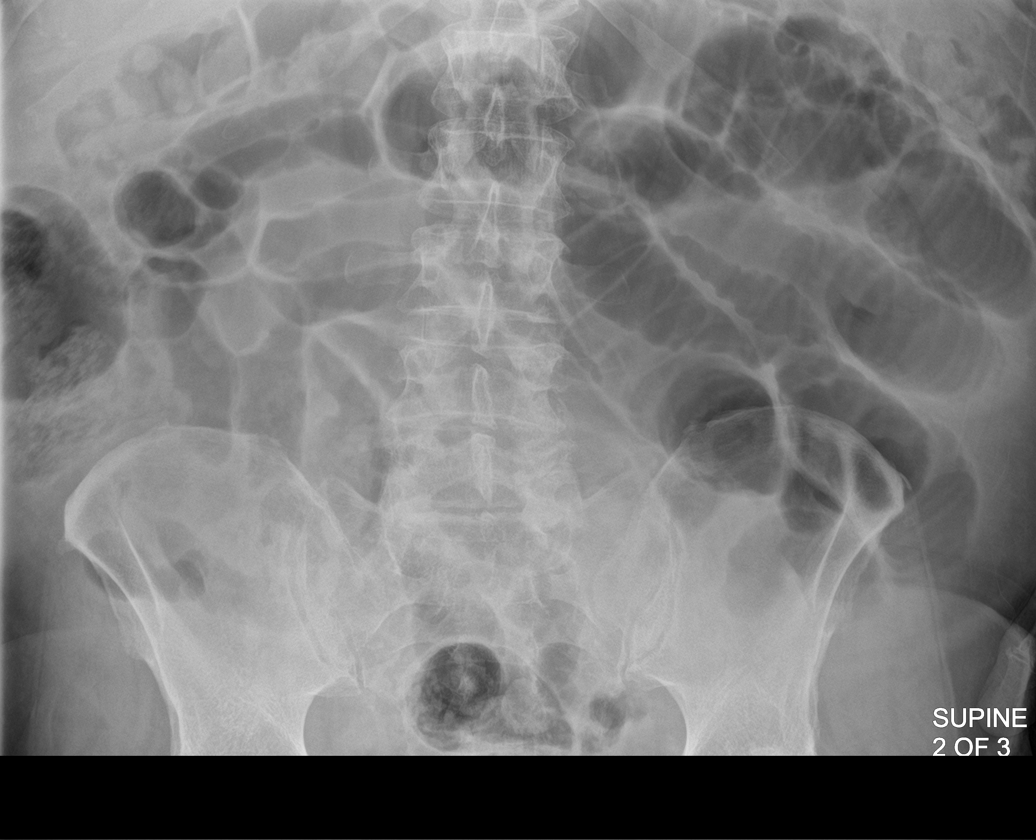

[5 of 5 positions shown; findings below may reference images not displayed]

FINDINGS: Stable cardiomediastinal silhouette. No pneumothorax is noted. Mild
bibasilar subsegmental atelectasis is noted. Minimal pleural
effusions may be present. No free air is noted. Mildly dilated small
bowel loops are seen in the left side of the abdomen concerning for
ileus or distal small bowel obstruction. No colonic dilatation is
noted. Stool is noted in the rectum.
IMPRESSION: Mild bibasilar subsegmental atelectasis with minimal pleural
effusions. Mildly dilated small bowel loops are seen in the left
side of the abdomen concerning for ileus or distal small bowel
obstruction.

## 2019-03-15 SURGERY — LAPAROTOMY, EXPLORATORY
Anesthesia: General

## 2019-03-15 MED ORDER — PHENYLEPHRINE HCL (PRESSORS) 10 MG/ML IV SOLN
INTRAVENOUS | Status: DC | PRN
  Administered 2019-03-15: 100 ug via INTRAVENOUS

## 2019-03-15 MED ORDER — MIDAZOLAM HCL 2 MG/2ML IJ SOLN
INTRAMUSCULAR | Status: DC | PRN
  Administered 2019-03-15: 2 mg via INTRAVENOUS

## 2019-03-15 MED ORDER — SUGAMMADEX SODIUM 500 MG/5ML IV SOLN
INTRAVENOUS | Status: AC
  Filled 2019-03-15: qty 5

## 2019-03-15 MED ORDER — PROPOFOL 10 MG/ML IV BOLUS
INTRAVENOUS | Status: AC
  Filled 2019-03-15: qty 20

## 2019-03-15 MED ORDER — DEXAMETHASONE SODIUM PHOSPHATE 10 MG/ML IJ SOLN
INTRAMUSCULAR | Status: AC
  Filled 2019-03-15: qty 1

## 2019-03-15 MED ORDER — ACETAMINOPHEN 10 MG/ML IV SOLN
INTRAVENOUS | Status: AC
  Filled 2019-03-15: qty 100

## 2019-03-15 MED ORDER — PHENYLEPHRINE HCL (PRESSORS) 10 MG/ML IV SOLN
INTRAVENOUS | Status: AC
  Filled 2019-03-15: qty 1

## 2019-03-15 MED ORDER — ALBUMIN HUMAN 25 % IV SOLN
12.5000 g | Freq: Once | INTRAVENOUS | Status: AC
  Administered 2019-03-15: 09:00:00 12.5 g via INTRAVENOUS
  Filled 2019-03-15: qty 50

## 2019-03-15 MED ORDER — FENTANYL CITRATE (PF) 100 MCG/2ML IJ SOLN
INTRAMUSCULAR | Status: DC | PRN
  Administered 2019-03-15: 25 ug via INTRAVENOUS
  Administered 2019-03-15: 50 ug via INTRAVENOUS
  Administered 2019-03-15: 25 ug via INTRAVENOUS
  Administered 2019-03-15: 50 ug via INTRAVENOUS

## 2019-03-15 MED ORDER — ROCURONIUM BROMIDE 100 MG/10ML IV SOLN
INTRAVENOUS | Status: DC | PRN
  Administered 2019-03-15: 30 mg via INTRAVENOUS
  Administered 2019-03-15: 50 mg via INTRAVENOUS
  Administered 2019-03-15: 20 mg via INTRAVENOUS

## 2019-03-15 MED ORDER — ACETAMINOPHEN 160 MG/5ML PO SOLN
325.0000 mg | ORAL | Status: DC | PRN
  Filled 2019-03-15: qty 20.3

## 2019-03-15 MED ORDER — ROCURONIUM BROMIDE 100 MG/10ML IV SOLN
INTRAVENOUS | Status: AC
  Filled 2019-03-15: qty 1

## 2019-03-15 MED ORDER — FENTANYL CITRATE (PF) 100 MCG/2ML IJ SOLN
INTRAMUSCULAR | Status: AC
  Filled 2019-03-15: qty 2

## 2019-03-15 MED ORDER — SUGAMMADEX SODIUM 500 MG/5ML IV SOLN
INTRAVENOUS | Status: DC | PRN
  Administered 2019-03-15: 300 mg via INTRAVENOUS

## 2019-03-15 MED ORDER — DEXAMETHASONE SODIUM PHOSPHATE 10 MG/ML IJ SOLN
INTRAMUSCULAR | Status: DC | PRN
  Administered 2019-03-15: 10 mg via INTRAVENOUS

## 2019-03-15 MED ORDER — LIDOCAINE HCL (PF) 2 % IJ SOLN
INTRAMUSCULAR | Status: AC
  Filled 2019-03-15: qty 10

## 2019-03-15 MED ORDER — LACTATED RINGERS IV SOLN
INTRAVENOUS | Status: DC | PRN
  Administered 2019-03-15 (×2): via INTRAVENOUS

## 2019-03-15 MED ORDER — LIDOCAINE HCL (CARDIAC) PF 100 MG/5ML IV SOSY
PREFILLED_SYRINGE | INTRAVENOUS | Status: DC | PRN
  Administered 2019-03-15: 100 mg via INTRAVENOUS

## 2019-03-15 MED ORDER — MEPERIDINE HCL 50 MG/ML IJ SOLN: 6.2500 mg | INTRAMUSCULAR | Status: DC | PRN

## 2019-03-15 MED ORDER — DEXTROSE-NACL 5-0.9 % IV SOLN
INTRAVENOUS | Status: DC
  Administered 2019-03-15 – 2019-03-16 (×2): via INTRAVENOUS

## 2019-03-15 MED ORDER — PROPOFOL 10 MG/ML IV BOLUS
INTRAVENOUS | Status: DC | PRN
  Administered 2019-03-15: 200 mg via INTRAVENOUS

## 2019-03-15 MED ORDER — OXYCODONE HCL 5 MG/5ML PO SOLN: 5.0000 mg | Freq: Once | ORAL | Status: DC | PRN

## 2019-03-15 MED ORDER — MIDAZOLAM HCL 2 MG/2ML IJ SOLN
INTRAMUSCULAR | Status: AC
  Filled 2019-03-15: qty 2

## 2019-03-15 MED ORDER — OXYCODONE HCL 5 MG PO TABS: 5.0000 mg | ORAL_TABLET | Freq: Once | ORAL | Status: DC | PRN

## 2019-03-15 MED ORDER — SODIUM CHLORIDE FLUSH 0.9 % IV SOLN
INTRAVENOUS | Status: AC
  Filled 2019-03-15: qty 50

## 2019-03-15 MED ORDER — KETOROLAC TROMETHAMINE 15 MG/ML IJ SOLN
15.0000 mg | Freq: Four times a day (QID) | INTRAMUSCULAR | Status: AC
  Administered 2019-03-15 – 2019-03-20 (×20): 15 mg via INTRAVENOUS
  Filled 2019-03-15 (×20): qty 1

## 2019-03-15 MED ORDER — LIDOCAINE HCL URETHRAL/MUCOSAL 2 % EX GEL
CUTANEOUS | Status: DC | PRN
  Administered 2019-03-15: 1 via TOPICAL

## 2019-03-15 MED ORDER — ACETAMINOPHEN 325 MG PO TABS: 325.0000 mg | ORAL_TABLET | ORAL | Status: DC | PRN

## 2019-03-15 MED ORDER — BUPIVACAINE-EPINEPHRINE (PF) 0.25% -1:200000 IJ SOLN
INTRAMUSCULAR | Status: AC
  Filled 2019-03-15: qty 30

## 2019-03-15 MED ORDER — FENTANYL CITRATE (PF) 100 MCG/2ML IJ SOLN: 25.0000 ug | INTRAMUSCULAR | Status: DC | PRN

## 2019-03-15 MED ORDER — ACETAMINOPHEN 10 MG/ML IV SOLN
INTRAVENOUS | Status: DC | PRN
  Administered 2019-03-15: 1000 mg via INTRAVENOUS

## 2019-03-15 MED ORDER — ONDANSETRON HCL 4 MG/2ML IJ SOLN
INTRAMUSCULAR | Status: AC
  Filled 2019-03-15: qty 2

## 2019-03-15 MED ORDER — PROMETHAZINE HCL 25 MG/ML IJ SOLN: 6.2500 mg | INTRAMUSCULAR | Status: DC | PRN

## 2019-03-15 MED ORDER — ACETAMINOPHEN 10 MG/ML IV SOLN
1000.0000 mg | Freq: Four times a day (QID) | INTRAVENOUS | Status: AC
  Administered 2019-03-15 – 2019-03-16 (×4): 1000 mg via INTRAVENOUS
  Filled 2019-03-15 (×4): qty 100

## 2019-03-15 MED ORDER — ONDANSETRON HCL 4 MG/2ML IJ SOLN
INTRAMUSCULAR | Status: DC | PRN
  Administered 2019-03-15: 4 mg via INTRAVENOUS

## 2019-03-15 MED ORDER — BUPIVACAINE LIPOSOME 1.3 % IJ SUSP
INTRAMUSCULAR | Status: AC
  Filled 2019-03-15: qty 20

## 2019-03-15 SURGICAL SUPPLY — 55 items
APPLIER CLIP 11 MED OPEN (CLIP)
APPLIER CLIP 13 LRG OPEN (CLIP)
BARRIER ADH SEPRAFILM 3INX5IN (MISCELLANEOUS) IMPLANT
BARRIER SKIN 2 1/4 (WOUND CARE) ×2 IMPLANT
BLADE CLIPPER SURG (BLADE) ×2 IMPLANT
BLADE SURG SZ10 CARB STEEL (BLADE) ×2 IMPLANT
BULB RESERV EVAC DRAIN JP 100C (MISCELLANEOUS) ×2 IMPLANT
BUR VERTEX HOODED 4.5 (BURR) IMPLANT
CANISTER SUCT 3000ML PPV (MISCELLANEOUS) ×2 IMPLANT
CHLORAPREP W/TINT 26 (MISCELLANEOUS) ×2 IMPLANT
CLIP APPLIE 11 MED OPEN (CLIP) IMPLANT
CLIP APPLIE 13 LRG OPEN (CLIP) IMPLANT
COLOSTOMY POUCH 3 (MISCELLANEOUS) ×2 IMPLANT
COVER BACK TABLE REUSABLE LG (DRAPES) ×2 IMPLANT
COVER WAND RF STERILE (DRAPES) ×2 IMPLANT
DRAIN CHANNEL JP 19F (MISCELLANEOUS) ×2 IMPLANT
DRAPE LAPAROTOMY 100X77 ABD (DRAPES) ×2 IMPLANT
DRSG TEGADERM 2-3/8X2-3/4 SM (GAUZE/BANDAGES/DRESSINGS) ×4 IMPLANT
DRSG TELFA 3X8 NADH (GAUZE/BANDAGES/DRESSINGS) ×2 IMPLANT
ELECT BLADE 6.5 EXT (BLADE) ×2 IMPLANT
ELECT REM PT RETURN 9FT ADLT (ELECTROSURGICAL) ×2
ELECTRODE REM PT RTRN 9FT ADLT (ELECTROSURGICAL) ×1 IMPLANT
GAUZE SPONGE 4X4 12PLY STRL (GAUZE/BANDAGES/DRESSINGS) ×4 IMPLANT
GLOVE BIO SURGEON STRL SZ7 (GLOVE) ×12 IMPLANT
GOWN STRL REUS W/ TWL LRG LVL3 (GOWN DISPOSABLE) ×3 IMPLANT
GOWN STRL REUS W/TWL LRG LVL3 (GOWN DISPOSABLE) ×3
HANDLE SUCTION POOLE (INSTRUMENTS) ×1 IMPLANT
HANDLE YANKAUER SUCT BULB TIP (MISCELLANEOUS) ×2 IMPLANT
LIGASURE IMPACT 36 18CM CVD LR (INSTRUMENTS) ×2 IMPLANT
NEEDLE HYPO 22GX1.5 SAFETY (NEEDLE) ×4 IMPLANT
NEEDLE HYPO 25X1 1.5 SAFETY (NEEDLE) ×2 IMPLANT
PACK BASIN MAJOR ARMC (MISCELLANEOUS) ×2 IMPLANT
PAD ABD DERMACEA PRESS 5X9 (GAUZE/BANDAGES/DRESSINGS) ×8 IMPLANT
RELOAD PROXIMATE 75MM BLUE (ENDOMECHANICALS) IMPLANT
SPONGE LAP 18X18 RF (DISPOSABLE) ×4 IMPLANT
SPONGE LAP 18X36 RFD (DISPOSABLE) ×2 IMPLANT
STAPLER CUT CVD 40MM BLUE (STAPLE) ×2 IMPLANT
STAPLER CUT RELOAD BLUE (STAPLE) ×4 IMPLANT
STAPLER PROXIMATE 75MM BLUE (STAPLE) IMPLANT
STAPLER SKIN PROX 35W (STAPLE) ×2 IMPLANT
SUCTION POOLE HANDLE (INSTRUMENTS) ×2
SUT PDS AB 0 CT1 27 (SUTURE) ×6 IMPLANT
SUT SILK 2 0 (SUTURE) ×1
SUT SILK 2 0 SH CR/8 (SUTURE) ×2 IMPLANT
SUT SILK 2 0SH CR/8 30 (SUTURE) ×2 IMPLANT
SUT SILK 2-0 18XBRD TIE 12 (SUTURE) ×1 IMPLANT
SUT VIC AB 0 CT1 36 (SUTURE) ×4 IMPLANT
SUT VIC AB 2-0 SH 27 (SUTURE) ×2
SUT VIC AB 2-0 SH 27XBRD (SUTURE) ×2 IMPLANT
SUT VIC AB 3-0 SH 27 (SUTURE) ×4
SUT VIC AB 3-0 SH 27X BRD (SUTURE) ×4 IMPLANT
SYR 30ML LL (SYRINGE) ×4 IMPLANT
SYR 3ML LL SCALE MARK (SYRINGE) ×2 IMPLANT
TAPE MICROFOAM 4IN (TAPE) ×2 IMPLANT
TRAY FOLEY MTR SLVR 16FR STAT (SET/KITS/TRAYS/PACK) ×2 IMPLANT

## 2019-03-15 NOTE — Anesthesia Post-op Follow-up Note (Signed)
Anesthesia QCDR form completed.        

## 2019-03-15 NOTE — Progress Notes (Signed)
15 minute call to floor. 

## 2019-03-15 NOTE — Progress Notes (Signed)
Pt seen and examined. Continues to endorse pain,. I did KUB this am and reports significant pain when he was moved for the xray. More distension./ KUB no free air. PE: non toxic Abd: distended, decrease bs, rebound tenderness w + roving c/w peritonitis.   A/P Diverticulitis w no clinical improvement , due to concerning clinical findings I do thing the safest next step is to perform Hartmann's before he deteriorates. D/W the pt in detail about the procedure, risks, benefits and possible complications, including but not limited to bleeding, infection, injury to adjacent structures. He understands and agrees.

## 2019-03-15 NOTE — Op Note (Signed)
PROCEDURES: 1. Laparotomy 2. Drainage of intra-abdominal abscess 3. Hartmann's procedure 4. Takedown splenic flexure  Pre-operative Diagnosis: perf diverticulitis w peritonitis  Post-operative Diagnosis: same  Surgeon: Marjory Lies Conor Filsaime   Assistants: Dr. Genevive Bi ( required due to the complexity of the case and for exposure)   Anesthesia: General endotracheal anesthesia  ASA Class: 3   Surgeon: Caroleen Hamman , MD FACS  Anesthesia: Gen. with endotracheal tube   Findings: Purulent peritonitis Perforation involving sigmoid colon w severe inflammatory response and reactive inflammation involving the TI Short mesentery and Morbid obesity making case very challinging  Estimated Blood Loss: 400cc         Drains: 19 Fr pelvis         Specimens: sigmoid colon          Complications: none                Condition: stable  Procedure Details  The patient was seen again in the Holding Room. The benefits, complications, treatment options, and expected outcomes were discussed with the patient. The risks of bleeding, infection, recurrence of symptoms, failure to resolve symptoms,  bowel injury, any of which could require further surgery were reviewed with the patient.   The patient was taken to Operating Room, identified as Tyrone Luna and the procedure verified.  A Time Out was held and the above information confirmed.  Prior to the induction of general anesthesia, antibiotic prophylaxis was administered. VTE prophylaxis was in place. General endotracheal anesthesia was then administered and tolerated well. After the induction, the abdomen was prepped with Chloraprep and draped in the sterile fashion. The patient was positioned in the supine position.  Generous midline laparotomy was performed.  Electrocautery was used to dissect through through subcutaneous tissue and incised the fascia.  Abdominal cavity was entered.  Inspection revealed severe diverticulitis.  There was a pocket that  we opened and do drainage of the abscess.  We cultured this.  The small bowel was significantly inflamed as well and there were dense adhesions from the small bowel to the sigmoid colon.  Obvious purulence within the abdominal cavity. Were able to incise a white line of Toldt mobilized the sigmoid colon from lateral to medial.  The area that was less inflamed was at the rectosigmoid junction.  Window within the mesentery was created and a contour regular load was used to divide the distal end.  The left ureter was identified preserved at all times.  Using the LigaSure device were able to divide the mesentery.  There was again an area of the sigmoid colon that was necrotic and perforated.  Were able to do a meticulous dissection and divide the mesentery of the sigmoid colon.  Attention then was turned to the splenic flexure where adhesions were lysed with electrocautery.  The omentum was taken off the transverse colon and the splenic flexure was divided using a combination of electrocautery and LigaSure.  Again please note that the patient body habitus made it very challenging as well as the short mesentery.  We were able to mobilize the whole splenic flexure to have adequate mobility of the descending colon.  Within the descending colon a margin that was viable was divided using another load of the contour stapler. Also visualized at the small bowel was very dilated secondary to reactive inflammatory response.  The NG tube was confirmed. We checked hemostasis with no evidence of active bleeding.  We created a defect within the left lower quadrant to bring the  end of the descending colon .  3 fingers were allowed to go through the abdominal wall and the fascia was obviously divided.  The venting colon was brought through the fascial defect in the standard fashion.  The abdominal cavity was irrigated profusely.  And a 18 [place] Blake drain was placed within the pelvis.  The abdominal cavity was closed with multiple  running 0 PDS sutures in the standard fashion.  Due to the contamination of the case I decided not to close the skin wet-to-dry was placed. The colostomy was matured with multiple 3-0 Vicryl sutures in [place] fashion.  Appliance was placed. Needle and laparotomy count were correct and there were no immediate complications  Caroleen Hamman, MD, FACS

## 2019-03-15 NOTE — Anesthesia Preprocedure Evaluation (Addendum)
Anesthesia Evaluation  Patient identified by MRN, date of birth, ID band Patient awake    Reviewed: Allergy & Precautions, H&P , NPO status , reviewed documented beta blocker date and time   Airway Mallampati: II  TM Distance: >3 FB Neck ROM: full    Dental  (+) Chipped, Caps   Pulmonary sleep apnea , former smoker,  Does not use CPAP post UVP surgery   Pulmonary exam normal        Cardiovascular hypertension, Normal cardiovascular exam     Neuro/Psych    GI/Hepatic   Endo/Other    Renal/GU      Musculoskeletal  (+) Arthritis ,   Abdominal   Peds  Hematology   Anesthesia Other Findings Past Medical History: No date: Arthritis No date: BPH associated with nocturia No date: ED (erectile dysfunction) No date: Erectile dysfunction No date: Hyperlipemia No date: Hypertension 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 No date: Sleep apnea No date: Urinary frequency No date: Urinary urgency Past Surgical History: 2002: EYE SURGERY; Bilateral     Comment:  laser No date: INGUINAL HERNIA REPAIR; Left 1995: KNEE SURGERY; Right BMI    Body Mass Index: 34.96 kg/m     Reproductive/Obstetrics                           Anesthesia Physical Anesthesia Plan  ASA: III and emergent  Anesthesia Plan: General   Post-op Pain Management:    Induction: Intravenous  PONV Risk Score and Plan: 3 and Ondansetron, Treatment may vary due to age or medical condition, Dexamethasone and Diphenhydramine  Airway Management Planned: Oral ETT  Additional Equipment:   Intra-op Plan:   Post-operative Plan: Extubation in OR  Informed Consent: I have reviewed the patients History and Physical, chart, labs and discussed the procedure including the risks, benefits and alternatives for the proposed anesthesia with the patient or authorized representative who has indicated his/her understanding and acceptance.     Dental  Advisory Given  Plan Discussed with: CRNA and Surgeon  Anesthesia Plan Comments: (Discussed need for possible central venous line given poor peripheral access. Also discussed possible prolonged intubation post op. )       Anesthesia Quick Evaluation

## 2019-03-15 NOTE — Anesthesia Procedure Notes (Signed)
Procedure Name: Intubation Date/Time: 03/15/2019 1:05 PM Performed by: Doreen Salvage, CRNA Pre-anesthesia Checklist: Patient identified, Patient being monitored, Timeout performed, Emergency Drugs available and Suction available Patient Re-evaluated:Patient Re-evaluated prior to induction Oxygen Delivery Method: Circle system utilized Preoxygenation: Pre-oxygenation with 100% oxygen Induction Type: IV induction Ventilation: Mask ventilation without difficulty Laryngoscope Size: Mac, 4 and McGraph Grade View: Grade I Tube type: Oral Tube size: 7.5 mm Number of attempts: 1 Airway Equipment and Method: Stylet Placement Confirmation: ETT inserted through vocal cords under direct vision,  positive ETCO2 and breath sounds checked- equal and bilateral Secured at: 23 cm Tube secured with: Tape Dental Injury: Teeth and Oropharynx as per pre-operative assessment

## 2019-03-15 NOTE — Transfer of Care (Signed)
Immediate Anesthesia Transfer of Care Note  Patient: Tyrone Luna  Procedure(s) Performed: Procedure(s): EXPLORATORY LAPAROTOMY,sigmoid colectomy,colostomy (N/A)  Patient Location: PACU  Anesthesia Type:General  Level of Consciousness: sedated  Airway & Oxygen Therapy: Patient Spontanous Breathing and Patient connected to face mask oxygen  Post-op Assessment: Report given to RN and Post -op Vital signs reviewed and stable  Post vital signs: Reviewed and stable  Last Vitals:  Vitals:   03/15/19 1133 03/15/19 1628  BP: (!) 151/78 128/78  Pulse: 68 72  Resp: 18 15  Temp: 36.8 C 36.4 C  SpO2: 45% (P) 62%    Complications: No apparent anesthesia complications

## 2019-03-15 NOTE — Plan of Care (Signed)
  Problem: Education: Goal: Knowledge of General Education information will improve Description: Including pain rating scale, medication(s)/side effects and non-pharmacologic comfort measures Outcome: Progressing   Problem: Health Behavior/Discharge Planning: Goal: Ability to manage health-related needs will improve Outcome: Progressing   Problem: Clinical Measurements: Goal: Ability to maintain clinical measurements within normal limits will improve Outcome: Progressing Goal: Will remain free from infection Outcome: Progressing Goal: Respiratory complications will improve Outcome: Progressing Goal: Cardiovascular complication will be avoided Outcome: Progressing   Problem: Elimination: Goal: Will not experience complications related to bowel motility Outcome: Progressing Goal: Will not experience complications related to urinary retention Outcome: Progressing   Problem: Safety: Goal: Ability to remain free from injury will improve Outcome: Progressing   Problem: Skin Integrity: Goal: Risk for impaired skin integrity will decrease Outcome: Progressing

## 2019-03-16 ENCOUNTER — Inpatient Hospital Stay: Payer: Self-pay

## 2019-03-16 ENCOUNTER — Encounter: Payer: Self-pay | Admitting: Surgery

## 2019-03-16 LAB — CBC
HCT: 50.1 % (ref 39.0–52.0)
Hemoglobin: 16.6 g/dL (ref 13.0–17.0)
MCH: 30.8 pg (ref 26.0–34.0)
MCHC: 33.1 g/dL (ref 30.0–36.0)
MCV: 92.9 fL (ref 80.0–100.0)
Platelets: 96 10*3/uL — ABNORMAL LOW (ref 150–400)
RBC: 5.39 MIL/uL (ref 4.22–5.81)
RDW: 14.6 % (ref 11.5–15.5)
WBC: 13 10*3/uL — ABNORMAL HIGH (ref 4.0–10.5)
nRBC: 0 % (ref 0.0–0.2)

## 2019-03-16 LAB — BASIC METABOLIC PANEL
Anion gap: 10 (ref 5–15)
BUN: 35 mg/dL — ABNORMAL HIGH (ref 8–23)
CO2: 21 mmol/L — ABNORMAL LOW (ref 22–32)
Calcium: 7.4 mg/dL — ABNORMAL LOW (ref 8.9–10.3)
Chloride: 106 mmol/L (ref 98–111)
Creatinine, Ser: 1.2 mg/dL (ref 0.61–1.24)
GFR calc Af Amer: >60 mL/min (ref >60)
GFR calc non Af Amer: >60 mL/min (ref >60)
Glucose, Bld: 234 mg/dL — ABNORMAL HIGH (ref 70–99)
Potassium: 4.4 mmol/L (ref 3.5–5.1)
Sodium: 137 mmol/L (ref 135–145)

## 2019-03-16 LAB — GLUCOSE, CAPILLARY
Glucose-Capillary: 110 mg/dL — ABNORMAL HIGH (ref 70–99)
Glucose-Capillary: 154 mg/dL — ABNORMAL HIGH (ref 70–99)
Glucose-Capillary: 173 mg/dL — ABNORMAL HIGH (ref 70–99)
Glucose-Capillary: 198 mg/dL — ABNORMAL HIGH (ref 70–99)

## 2019-03-16 LAB — HEMOGLOBIN A1C
Hgb A1c MFr Bld: 6.5 % — ABNORMAL HIGH (ref 4.8–5.6)
Mean Plasma Glucose: 139.85 mg/dL

## 2019-03-16 LAB — PHOSPHORUS: Phosphorus: 3.5 mg/dL (ref 2.5–4.6)

## 2019-03-16 LAB — MAGNESIUM: Magnesium: 2.5 mg/dL — ABNORMAL HIGH (ref 1.7–2.4)

## 2019-03-16 MED ORDER — ALBUMIN HUMAN 25 % IV SOLN
12.5000 g | Freq: Two times a day (BID) | INTRAVENOUS | Status: DC
  Administered 2019-03-16 – 2019-03-17 (×3): 12.5 g via INTRAVENOUS
  Filled 2019-03-16 (×4): qty 50

## 2019-03-16 MED ORDER — INSULIN ASPART 100 UNIT/ML ~~LOC~~ SOLN
0.0000 [IU] | Freq: Three times a day (TID) | SUBCUTANEOUS | Status: DC
  Administered 2019-03-16: 3 [IU] via SUBCUTANEOUS
  Filled 2019-03-16: qty 1

## 2019-03-16 MED ORDER — INSULIN ASPART 100 UNIT/ML ~~LOC~~ SOLN: 0.0000 [IU] | Freq: Every day | SUBCUTANEOUS | Status: DC

## 2019-03-16 MED ORDER — SODIUM CHLORIDE 0.9% FLUSH: 10.0000 mL | INTRAVENOUS | Status: DC | PRN

## 2019-03-16 MED ORDER — INSULIN ASPART 100 UNIT/ML ~~LOC~~ SOLN
6.0000 [IU] | Freq: Three times a day (TID) | SUBCUTANEOUS | Status: DC
  Administered 2019-03-16 (×2): 6 [IU] via SUBCUTANEOUS
  Filled 2019-03-16 (×2): qty 1

## 2019-03-16 MED ORDER — INSULIN ASPART 100 UNIT/ML ~~LOC~~ SOLN
0.0000 [IU] | SUBCUTANEOUS | Status: DC
  Administered 2019-03-16 – 2019-03-17 (×2): 2 [IU] via SUBCUTANEOUS
  Administered 2019-03-17 (×3): 1 [IU] via SUBCUTANEOUS
  Administered 2019-03-17: 2 [IU] via SUBCUTANEOUS
  Administered 2019-03-18 – 2019-03-20 (×10): 1 [IU] via SUBCUTANEOUS
  Administered 2019-03-20 (×2): 2 [IU] via SUBCUTANEOUS
  Administered 2019-03-20 (×2): 1 [IU] via SUBCUTANEOUS
  Administered 2019-03-21 (×2): 2 [IU] via SUBCUTANEOUS
  Administered 2019-03-21: 1 [IU] via SUBCUTANEOUS
  Administered 2019-03-21 (×2): 2 [IU] via SUBCUTANEOUS
  Administered 2019-03-21 – 2019-03-22 (×5): 1 [IU] via SUBCUTANEOUS
  Filled 2019-03-16 (×29): qty 1

## 2019-03-16 MED ORDER — FAT EMULSION PLANT BASED 20 % IV EMUL
250.0000 mL | INTRAVENOUS | Status: AC
  Administered 2019-03-16: 19:00:00 250 mL via INTRAVENOUS
  Filled 2019-03-16: qty 250

## 2019-03-16 MED ORDER — TRACE MINERALS CR-CU-MN-SE-ZN 10-1000-500-60 MCG/ML IV SOLN
INTRAVENOUS | Status: AC
  Administered 2019-03-16: 19:00:00 via INTRAVENOUS
  Filled 2019-03-16: qty 960

## 2019-03-16 MED ORDER — SODIUM CHLORIDE 0.9 % IV SOLN
INTRAVENOUS | Status: AC
  Administered 2019-03-16 – 2019-03-18 (×3): via INTRAVENOUS

## 2019-03-16 MED ORDER — SODIUM CHLORIDE 0.9% FLUSH
10.0000 mL | Freq: Two times a day (BID) | INTRAVENOUS | Status: DC
  Administered 2019-03-17 – 2019-03-23 (×8): 10 mL

## 2019-03-16 NOTE — Care Management Important Message (Signed)
Important Message  Patient Details  Name: Tyrone Luna MRN: [id number] Date of Birth: 11-08-1945   Medicare Important Message Given:  Yes     [name] 03/16/2019, 12:07 PM

## 2019-03-16 NOTE — Progress Notes (Signed)
[facility] Day(s): 4.   Post op day(s): 1 Day Post-Op.   Interval History: Patient seen and examined, no acute events or new complaints overnight. Patient reports that he is sore but feeling better compared to yesterday. No fever, chills, nausea. He does have NGT. JP with 260 ccs out since placement. Minimal colostomy output. Has not mobilized. Currently NPO.    Vital signs in last 24 hours: [min-max] current  Temp:  [96.6 F (35.9 C)-98.5 F (36.9 C)] 97.6 F (36.4 C) (07/20 0414) Pulse Rate:  [62-80] 62 (07/20 0414) Resp:  [15-22] 18 (07/20 0414) BP: (121-158)/(72-84) 144/76 (07/20 0414) SpO2:  [92 %-95 %] 94 % (07/20 0414)     Height: 6\' 1"  (185.4 cm) Weight: 120.2 kg BMI (Calculated): 34.97   Intake/Output last 2 shifts:  07/19 0701 - 07/20 0700 In: 3548.3 [I.V.:3333.5; IV Piggyback:214.8] Out: 2010 [Urine:1350; Drains:260; Blood:400]   Physical Exam:  Constitutional: alert, cooperative and no distress  HEENT: NGT in place Respiratory: breathing non-labored at rest  Cardiovascular: regular rate and sinus rhythm  Gastrointestinal: soft, expected post-surgical soreness, and non-distended. No rebound, guarding. Colostomy in LLQ with small amounts of hard stool in bag.  Integumentary: Midline laparotomy incision healing via secondary intention, dressing in place.    Labs:  CBC Latest Ref Rng & Units 03/16/2019 03/15/2019 03/14/2019  WBC 4.0 - 10.5 K/uL 13.0(H) 13.2(H) 17.6(H)  Hemoglobin 13.0 - 17.0 g/dL 16.6 17.2(H) 17.0  Hematocrit 39.0 - 52.0 % 50.1 51.1 50.8  Platelets 150 - 400 K/uL 96(L) 94(L) 86(L)   CMP Latest Ref Rng & Units 03/16/2019 03/15/2019 03/14/2019  Glucose 70 - 99 mg/dL 234(H) 105(H) 133(H)  BUN 8 - 23 mg/dL 35(H) 30(H) 29(H)  Creatinine 0.61 - 1.24 mg/dL 1.20 1.20 1.19  Sodium 135 - 145 mmol/L 137 136 133(L)  Potassium 3.5 - 5.1 mmol/L 4.4 4.3 4.0  Chloride 98 - 111 mmol/L 106 100 101  CO2 22 - 32 mmol/L  21(L) 25 24  Calcium 8.9 - 10.3 mg/dL 7.4(L) 8.4(L) 7.9(L)  Total Protein 6.5 - 8.1 g/dL - - -  Total Bilirubin 0.3 - 1.2 mg/dL - - -  Alkaline Phos 38 - 126 U/L - - -  AST 15 - 41 U/L - - -  ALT 0 - 44 U/L - - -     Imaging studies: No new pertinent imaging studies   Assessment/Plan:  73 y.o. male with expected postsurgical soreness, slight dehydration, and likely expected post-surgical soreness 1 Day Post-Op s/p Hartman's Procedure for  Diverticulitis which failed medical management.   - Continue NPO + NGT decompression  - Will consult for PICC + TPN  - Rehydration + Albumin   - pain control prn; antiemetics prn  - Monitor abdominal examination; colostomy function; JP output   - Continue foley today  - Add SSI for hyperglycemia  - Wet to dry dressing changes daily + prn for midline wound  - medical management of comorbidities  - mobilization encouraged; engage PT  - DVT prophylaxis   All of the above findings and recommendations were discussed with the patient, patient's family (wife via telephone), and the medical team, and all of patient's and family's questions were answered to their expressed satisfaction.  -- Edison Simon, PA-C [facility] 03/16/2019, 8:29 AM [phone] M-F: 7am - 4pm

## 2019-03-16 NOTE — Consult Note (Signed)
[facility] NOTE   Pharmacy Consult for TPN  Indication: prolonged ileus  Patient Measurements: Height: 6\' 1"  (185.4 cm) Weight: 255 lb 15.3 oz (116.1 kg)(bed scale) IBW/kg (Calculated) : 79.9 TPN AdjBW (KG): 90 Body mass index is 33.77 kg/m. Usual Weight: 116.1 kg  Assessment:  Pharmacy consulted for initiation and monitoring of TPN and electrolytes for 73yo patient post Hartmann's Procedure.   GI: LBM 7/19 Endo: SSI+6u novolog TID w/meals Insulin requirements in the past 24 hours: 0u Lytes: K 4.4, Mag 2.5, Phos 3.5 Renal: Scr 1.2 Pulm:  Cards: amlodipine 10mg  daily, Triamterene-HCTZ 37.5/25mg  Hepatobil: Neuro: ID: Zosyn 7/16>>  TPN Access: PICC placement pending TPN start date: anticipated start date is 7/20 Nutritional Goals  After 24 hours if electrolytes, CBGs, and triglycerides are in acceptable range advance to goal regimen of Clinimix 5/20 (assess daily if providing electrolytes or no electrolytes) at 83 mL/hr x 24 hours + 20% ILE at 20 mL/hr x 12 hours.   Goal regimen provides 2233 kcal (97% estimated needs), 100 grams of protein (87% estimated needs), 1992 mL from Clinimix and 240 mL from lipids daily. Unable to meet full calorie/protein needs with Clinimix.  Provide adult MVI daily in TPN and trace elements M/W/F in TPN due to shortage.  Plan is for total fluid volume of 100 mL/hr including TPN.  Measure daily weights.  Goal TPN rate is 83 ml/hr  Current Nutrition:   Plan:  7/20 TPN at 23mL/hr x 24 hours. Lipids at 1ml/hr for 12 hours This TPN provides 192 kcal of protein, 192 g of dextrose, and 22ml of lipids which provides  kCals per day, meeting 97% of patient needs Electrolytes in TPN: none currently Add MVI daily, trace elements every MWF(next due 7/22) No current insulin added to TPN bag. 7/20 IVMF (D5, NS, etc.) at 40 ml/hr Monitor TPN labs, electrolytes(Phos,Mag and K) daily x first 3 days.  Triglycerides F/U daily  Emran Molzahn A Teofilo Lupinacci 03/16/2019,12:12 PM

## 2019-03-16 NOTE — Anesthesia Postprocedure Evaluation (Signed)
Anesthesia Post Note  Patient: Tyrone Luna  Procedure(s) Performed: EXPLORATORY LAPAROTOMY,sigmoid colectomy,colostomy (N/A )  Patient location during evaluation: PACU Anesthesia Type: General Level of consciousness: awake and alert Pain management: pain level controlled Vital Signs Assessment: post-procedure vital signs reviewed and stable Respiratory status: spontaneous breathing, nonlabored ventilation, respiratory function stable and patient connected to nasal cannula oxygen Cardiovascular status: blood pressure returned to baseline and stable Postop Assessment: no apparent nausea or vomiting Anesthetic complications: no     Last Vitals:  Vitals:   03/16/19 0414 03/16/19 0843  BP: (!) 144/76 (!) 161/86  Pulse: 62 62  Resp: 18 20  Temp: 36.4 C (!) 36.4 C  SpO2: 94% 97%    Last Pain:  Vitals:   03/16/19 0843  TempSrc: Oral  PainSc:                  Alphonsus Sias

## 2019-03-16 NOTE — Progress Notes (Signed)
Initial Nutrition Assessment  DOCUMENTATION CODES:   Obesity unspecified  INTERVENTION:  Once PICC placed and confirmed, initiate Clinimix 5/20 without electrolytes at 40 mL/hr x 24 hours + 20% ILE at 20 mL/hr x 12 hours.  After 24 hours if electrolytes, CBGs, and triglycerides are in acceptable range advance to goal regimen of Clinimix 5/20 (assess daily if providing electrolytes or no electrolytes) at 83 mL/hr x 24 hours + 20% ILE at 20 mL/hr x 12 hours. Goal regimen provides 2233 kcal (97% estimated needs), 100 grams of protein (87% estimated needs), 1992 mL from Clinimix and 240 mL from lipids daily. Unable to meet full calorie/protein needs with Clinimix.  Provide adult MVI daily in TPN and trace elements M/W/F in TPN due to shortage.  Plan is for total fluid volume of 100 mL/hr including TPN.  Measure daily weights.  Patient will benefit from education regarding nutrition and new colostomy prior to discharge.  NUTRITION DIAGNOSIS:   Inadequate oral intake related to inability to eat as evidenced by NPO status.  GOAL:   Patient will meet greater than or equal to 90% of their needs  MONITOR:   Diet advancement, Labs, Weight trends, Skin, I & O's  REASON FOR ASSESSMENT:   Consult New TPN/TNA  ASSESSMENT:   73 year old male with PMHx of arthritis, sleep apnea, HTN, BPH admitted with perforated diverticulitis with peritonitis s/p laparotomy, drainage of intra-abdominal abscess, Hartmann's procedure, and takedown of splenic flexure on 7/19.   Met with patient and his wife at bedside. Patient reports he had a good appetite and intake at baseline. He developed abdominal pain on AM of 7/16 and presented to hospital. He has been NPO since so last meal was on 7/15. Patient reports he has discussed TPN with MD and feels comfortable. No further questions regarding TPN today. Patient does not meet criteria for malnutrition but is at risk for development of acute malnutrition (being  addressed with initiation of nutrition support). Patient has NGT in place currently to LIS.  Patient reports he is weight stable and that his UBW is 265 lbs. RD obtained bed scale weight of 116.1 kg today (255.95 lbs).  IV Access: order in for placement of PICC today  Medications reviewed and include: Novolog 0-15 units TID, Novolog 0-5 units QHS, Novolog 6 units TID with meals, pantoprazole, human albumin 12.5 grams BID IV, D5-NS at 125 mL/hr (150 grams dextrose, 510 kcal daily), Zosyn.  Labs reviewed: CO2 21, BUN 35, Magnesium 2.5.  I/O: 1350 mL UOP yesterday (0.5 mL/kg/hr); 260 mL output from right JP drain yesterday  Plan is to start TPN today. Total fluids to be kept at 100 mL/hr. Plan is to initiate TPN without electrolytes and monitor.  NUTRITION - FOCUSED PHYSICAL EXAM:    Most Recent Value  Orbital Region  No depletion  Upper Arm Region  No depletion  Thoracic and Lumbar Region  No depletion  Buccal Region  No depletion  Temple Region  No depletion  Clavicle Bone Region  No depletion  Clavicle and Acromion Bone Region  No depletion  Scapular Bone Region  No depletion  Dorsal Hand  No depletion  Patellar Region  No depletion  Anterior Thigh Region  No depletion  Posterior Calf Region  Unable to assess  Edema (RD Assessment)  Moderate [bilateral lower extremities]  Hair  Reviewed  Eyes  Reviewed  Mouth  Reviewed  Skin  Reviewed  Nails  Reviewed     Diet Order:   Diet  Order            Diet NPO time specified Except for: Ice Chips, Sips with Meds  Diet effective now             EDUCATION NEEDS:   Not appropriate for education at this time  Skin:  Skin Assessment: Skin Integrity Issues:(closed incision to abdomen)  Last BM:  03/15/2019: small amt brown stool per colostomy  Height:   Ht Readings from Last 1 Encounters:  03/12/19 _0  (1.854 m)   Weight:   Wt Readings from Last 1 Encounters:  03/16/19 116.1 kg   Ideal Body Weight:  83.6 kg  BMI:   Body mass index is 33.77 kg/m.  Estimated Nutritional Needs:   Kcal:  3662-9476  Protein:  115-125 grams  Fluid:  2-2.5 L/day  Willey Blade, MS, RD, LDN Office: [phone] Pager: [phone] After Hours/Weekend Pager: [phone]

## 2019-03-16 NOTE — Progress Notes (Signed)
Peripherally Inserted Central Catheter/Midline Placement  The IV Nurse has discussed with the patient and/or persons authorized to consent for the patient, the purpose of this procedure and the potential benefits and risks involved with this procedure.  The benefits include less needle sticks, lab draws from the catheter, and the patient may be discharged home with the catheter. Risks include, but not limited to, infection, bleeding, blood clot (thrombus formation), and puncture of an artery; nerve damage and irregular heartbeat and possibility to perform a PICC exchange if needed/ordered by physician.  Alternatives to this procedure were also discussed.  Bard Power PICC patient education guide, fact sheet on infection prevention and patient information card has been provided to patient /or left at bedside.    PICC/Midline Placement Documentation  PICC Double Lumen 03/16/19 PICC Right Brachial 46 cm 0 cm (Active)  Indication for Insertion or Continuance of Line Administration of hyperosmolar/irritating solutions (i.e. TPN, Vancomycin, etc.) 03/16/19 1720  Exposed Catheter (cm) 0 cm 03/16/19 1720  Site Assessment Clean;Dry;Intact 03/16/19 1720  Lumen #1 Status Flushed;Saline locked;Blood return noted 03/16/19 1720  Lumen #2 Status Flushed;Saline locked;Blood return noted 03/16/19 1720  Dressing Type Transparent 03/16/19 1720  Dressing Status Clean;Dry;Intact;Antimicrobial disc in place 03/16/19 1720  Dressing Change Due 03/23/19 03/16/19 1720       Gordan Payment 03/16/2019, 5:21 PM

## 2019-03-16 NOTE — Evaluation (Signed)
Physical Therapy Evaluation Patient Details Name: Tyrone Luna MRN: [id number] DOB: [birth year] Today's Date: 03/16/2019   History of Present Illness  Tyrone Luna is a 73 y.o. male who presented to hospital ED per reccomendation of PCP for abdominal pain and inability to have BM. He was admitted with acute sigmoid diverticulitis. On 03/15/2019 he underwent laparotomy, drainage of intra-abdominal abscess, Hartmann's procedure and takedown of splenic flexure for perforated diverticulitis w peritonitis. PMH includes Arthritis, BPH associated with nocturia, ED (erectile dysfunction), Erectile dysfunction, Hyperlipemia, Hypertension, Male hypogonadism, Sleep apnea, Urinary frequency, and Urinary urgency, hx of R knee sx, L inguinal hernia repair, and bilateral eye sx.    Clinical Impression  Patient A&O x 4 and able to provide a detailed history. Wife present throughout session. Patient reports that prior to hospitalization he was I with all activities and mobility including ADLs, IADLs, driving, and household and community mobility. He lives with his wife on the main floor where he has a 3 steps to enter with R hand rail and a walk in shower. He has a SPC and no other DME presently. Upon physical therapy evaluation, patient completed supine to sit bed mobility with  mod A +2, sit <> stand transfer bed to chair with RW and min A + 2. He ambulated about 3 feet between the bed and chair. Patient demo several failed attempts at standing until he was able to shift forward adequately. He denied pain but was slightly apprehensive about mobility the day after surgery. Patient demo improved confidence upon repeated marching in place until he felt ready to ambulate to his chair. He demo slow cautious gait and required manual assistance for hand placement upon stand to sit. Patient required 2L/min O2 throughout session and his SpO2 dropped to 92% with activity and returned to 94% upon rest. His HR remained  WFL throughout session. NG Tube was disconnected during session by nursing per PT request to enable patient to move more freely. Patient fatigued quickly and requested ice chips at end of session, which PT provided. Patient demonstrates a decrease of functional mobility and independence and would benefit from physical therapy to address impairments and functional limitations (see PT Problem List below) to work towards stated goals and return to PLOF or maximal functional independence. At this point he would benefit from short term rehab due to deficits in functional strength and independence that limit his safety at home, but he has the potential to progress rapidly during his hospital stay and will be assessed each session for progress that will allow recommendations to change to discharge home.      Follow Up Recommendations SNF(pending progress. Has potential to progress during hospital stay to become appropriate for home discharge. Will continue to monitor)    Equipment Recommendations  Rolling walker with 5" wheels;3in1 (PT)    Recommendations for Other Services OT consult     Precautions / Restrictions Precautions Precautions: Other (comment) Precaution Comments: NG tube, Ostomy bag, drains, abdominal incision. Restrictions Weight Bearing Restrictions: No      Mobility  Bed Mobility Overal bed mobility: Needs Assistance Bed Mobility: Supine to Sit     Supine to sit: Mod assist     General bed mobility comments: Instructed patient in log roll technique but he wanted to try another way. Got stuck and required mod A to get to seated position.  Transfers Overall transfer level: Needs assistance Equipment used: Rolling walker (2 wheeled) Transfers: Sit to/from Stand Sit to Stand: +2 physical assistance;Min  assist;+2 safety/equipment         General transfer comment: Completed sit <> stand bed to chair using RW with min A +2. Patient with difficulty shifting forward adequately  and demo several failed attempts prior to standing successfully. Required manual guidance to find chair arms when reaching back.  Ambulation/Gait Ambulation/Gait assistance: +2 physical assistance;+2 safety/equipment Gait Distance (Feet): 3 Feet Assistive device: Rolling walker (2 wheeled) Gait Pattern/deviations: Shuffle;Decreased stride length Gait velocity: reduced   General Gait Details: Pt ambulated a few steps from edge of bed to chair with RW and CGA +2 for safety and to manage lines. Slow and cautious. Required cuing for body and AD positioning.  Stairs            Wheelchair Mobility    Modified Rankin (Stroke Patients Only)       Balance Overall balance assessment: Needs assistance Sitting-balance support: Feet supported;Bilateral upper extremity supported Sitting balance-Leahy Scale: Fair Sitting balance - Comments: patient steady sitting at edge of bed with BUE support. Able to lift arms for 5 seconds while donning gait belt.   Standing balance support: Bilateral upper extremity supported;During functional activity Standing balance-Leahy Scale: Poor Standing balance comment: Patient dependent on RW for support while standing and ambulating.                             Pertinent Vitals/Pain Pain Assessment: No/denies pain    Home Living Family/patient expects to be discharged to:: Private residence Living Arrangements: Spouse/significant other Available Help at Discharge: Family(wife is independent at baseline, cannot provide heavy physical assist) Type of Home: House Home Access: Stairs to enter Entrance Stairs-Rails: Right Entrance Stairs-Number of Steps: 3 Home Layout: Able to live on main level with bedroom/bathroom(2nd story they don't need to access) Home Equipment: Cane - single point      Prior Function Level of Independence: Independent         Comments: Patient is retired. Was completing all ADLs, IADLs including driving, and  walking independently household and community distances prior to hospitalization.     Hand Dominance        Extremity/Trunk Assessment   Upper Extremity Assessment Upper Extremity Assessment: Generalized weakness    Lower Extremity Assessment Lower Extremity Assessment: Generalized weakness    Cervical / Trunk Assessment Cervical / Trunk Assessment: Normal  Communication   Communication: No difficulties  Cognition Arousal/Alertness: Awake/alert Behavior During Therapy: WFL for tasks assessed/performed Overall Cognitive Status: Within Functional Limits for tasks assessed                                 General Comments: Patient A & O x 4. Wife present      General Comments      Exercises Other Exercises Other Exercises: standing marching at EOB with RW and CGA +2 for safety. x 10 marches each side in prep for transfer to chair. Stooped posture. Timid at first, improved with reps. Other Exercises: Education and practice on proper and effective transfer techniques.   Assessment/Plan    PT Assessment Patient needs continued PT services  PT Problem List Decreased strength;Decreased mobility;Decreased range of motion;Obesity;Decreased activity tolerance;Cardiopulmonary status limiting activity;Decreased skin integrity;Decreased balance;Decreased knowledge of use of DME;Pain       PT Treatment Interventions DME instruction;Therapeutic activities;Gait training;Therapeutic exercise;Patient/family education;Stair training;Balance training;Functional mobility training;Neuromuscular re-education    PT Goals (Current goals can be  found in the Care Plan section)  Acute Rehab PT Goals Patient Stated Goal: return home, return to PLOF PT Goal Formulation: With patient/family Time For Goal Achievement: 03/30/19 Potential to Achieve Goals: Good    Frequency Min 2X/week   Barriers to discharge Decreased caregiver support At this point, pt requires physical  assistance with transfers.    Co-evaluation               AM-PAC PT "6 Clicks" Mobility  Outcome Measure Help needed turning from your back to your side while in a flat bed without using bedrails?: A Lot Help needed moving from lying on your back to sitting on the side of a flat bed without using bedrails?: A Lot Help needed moving to and from a bed to a chair (including a wheelchair)?: A Little Help needed standing up from a chair using your arms (e.g., wheelchair or bedside chair)?: A Little Help needed to walk in hospital room?: A Lot Help needed climbing 3-5 steps with a railing? : Total 6 Click Score: 13    End of Session Equipment Utilized During Treatment: Gait belt;Oxygen(2L/min O2 throughout treatment) Activity Tolerance: Patient tolerated treatment well;No increased pain;Patient limited by fatigue Patient left: in chair;with call bell/phone within reach;with chair alarm set;with family/visitor present;with nursing/sitter in room Nurse Communication: Mobility status PT Visit Diagnosis: Muscle weakness (generalized) (M62.81);Unsteadiness on feet (R26.81);Difficulty in walking, not elsewhere classified (R26.2)    Time: 1335-1405 PT Time Calculation (min) (ACUTE ONLY): 30 min   Charges:   PT Evaluation $PT Eval Moderate Complexity: 1 Mod PT Treatments $Therapeutic Activity: 8-22 mins        Everlean Alstrom. Graylon Good, PT, DPT 03/16/19, 7:11 PM

## 2019-03-17 ENCOUNTER — Inpatient Hospital Stay: Payer: Medicare HMO

## 2019-03-17 LAB — CULTURE, BLOOD (ROUTINE X 2)
Culture: NO GROWTH
Culture: NO GROWTH
Special Requests: ADEQUATE
Special Requests: ADEQUATE

## 2019-03-17 LAB — COMPREHENSIVE METABOLIC PANEL
ALT: 23 U/L (ref 0–44)
AST: 22 U/L (ref 15–41)
Albumin: 2.1 g/dL — ABNORMAL LOW (ref 3.5–5.0)
Alkaline Phosphatase: 51 U/L (ref 38–126)
Anion gap: 7 (ref 5–15)
BUN: 30 mg/dL — ABNORMAL HIGH (ref 8–23)
CO2: 24 mmol/L (ref 22–32)
Calcium: 7.6 mg/dL — ABNORMAL LOW (ref 8.9–10.3)
Chloride: 106 mmol/L (ref 98–111)
Creatinine, Ser: 0.97 mg/dL (ref 0.61–1.24)
GFR calc Af Amer: >60 mL/min (ref >60)
GFR calc non Af Amer: >60 mL/min (ref >60)
Glucose, Bld: 160 mg/dL — ABNORMAL HIGH (ref 70–99)
Potassium: 3.5 mmol/L (ref 3.5–5.1)
Sodium: 137 mmol/L (ref 135–145)
Total Bilirubin: 1.6 mg/dL — ABNORMAL HIGH (ref 0.3–1.2)
Total Protein: 5 g/dL — ABNORMAL LOW (ref 6.5–8.1)

## 2019-03-17 LAB — GLUCOSE, CAPILLARY
Glucose-Capillary: 127 mg/dL — ABNORMAL HIGH (ref 70–99)
Glucose-Capillary: 135 mg/dL — ABNORMAL HIGH (ref 70–99)
Glucose-Capillary: 139 mg/dL — ABNORMAL HIGH (ref 70–99)
Glucose-Capillary: 144 mg/dL — ABNORMAL HIGH (ref 70–99)
Glucose-Capillary: 159 mg/dL — ABNORMAL HIGH (ref 70–99)
Glucose-Capillary: 169 mg/dL — ABNORMAL HIGH (ref 70–99)

## 2019-03-17 LAB — DIFFERENTIAL
Abs Immature Granulocytes: 0.15 10*3/uL — ABNORMAL HIGH (ref 0.00–0.07)
Basophils Absolute: 0 10*3/uL (ref 0.0–0.1)
Basophils Relative: 0 %
Eosinophils Absolute: 0 10*3/uL (ref 0.0–0.5)
Eosinophils Relative: 0 %
Immature Granulocytes: 1 %
Lymphocytes Relative: 4 %
Lymphs Abs: 0.4 10*3/uL — ABNORMAL LOW (ref 0.7–4.0)
Monocytes Absolute: 0.3 10*3/uL (ref 0.1–1.0)
Monocytes Relative: 3 %
Neutro Abs: 10.4 10*3/uL — ABNORMAL HIGH (ref 1.7–7.7)
Neutrophils Relative %: 92 %

## 2019-03-17 LAB — PREALBUMIN: Prealbumin: 8 mg/dL — ABNORMAL LOW (ref 18–38)

## 2019-03-17 LAB — CBC
HCT: 41 % (ref 39.0–52.0)
Hemoglobin: 13.7 g/dL (ref 13.0–17.0)
MCH: 30.4 pg (ref 26.0–34.0)
MCHC: 33.4 g/dL (ref 30.0–36.0)
MCV: 90.9 fL (ref 80.0–100.0)
Platelets: 88 10*3/uL — ABNORMAL LOW (ref 150–400)
RBC: 4.51 MIL/uL (ref 4.22–5.81)
RDW: 14.6 % (ref 11.5–15.5)
WBC: 11.3 10*3/uL — ABNORMAL HIGH (ref 4.0–10.5)
nRBC: 0 % (ref 0.0–0.2)

## 2019-03-17 LAB — MAGNESIUM: Magnesium: 2.5 mg/dL — ABNORMAL HIGH (ref 1.7–2.4)

## 2019-03-17 LAB — PHOSPHORUS: Phosphorus: 1.5 mg/dL — ABNORMAL LOW (ref 2.5–4.6)

## 2019-03-17 LAB — TRIGLYCERIDES: Triglycerides: 218 mg/dL — ABNORMAL HIGH (ref <150)

## 2019-03-17 IMAGING — CR DG ABDOMEN ACUTE W/ 1V CHEST
4 series · 4 of 4 positions shown · non-contrast
Comparison: Radiograph of [DATE].

CLINICAL DATA: Ileus.

EXAM:
DG ABDOMEN ACUTE W/ 1V CHEST

[abdomen erect]
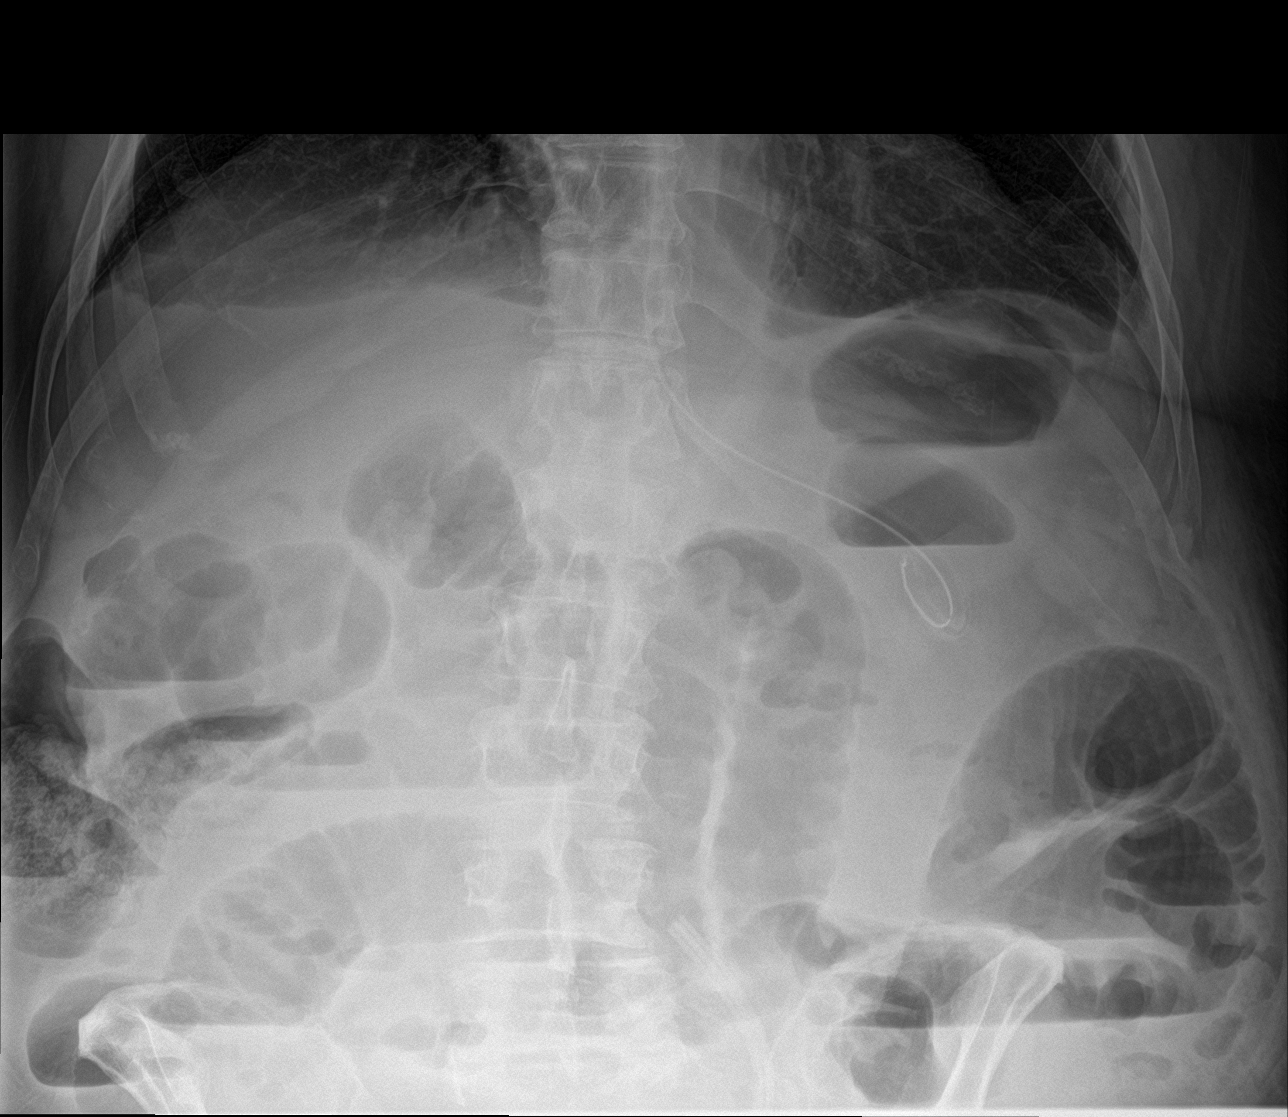

[abdomen supine (1 of 2)]
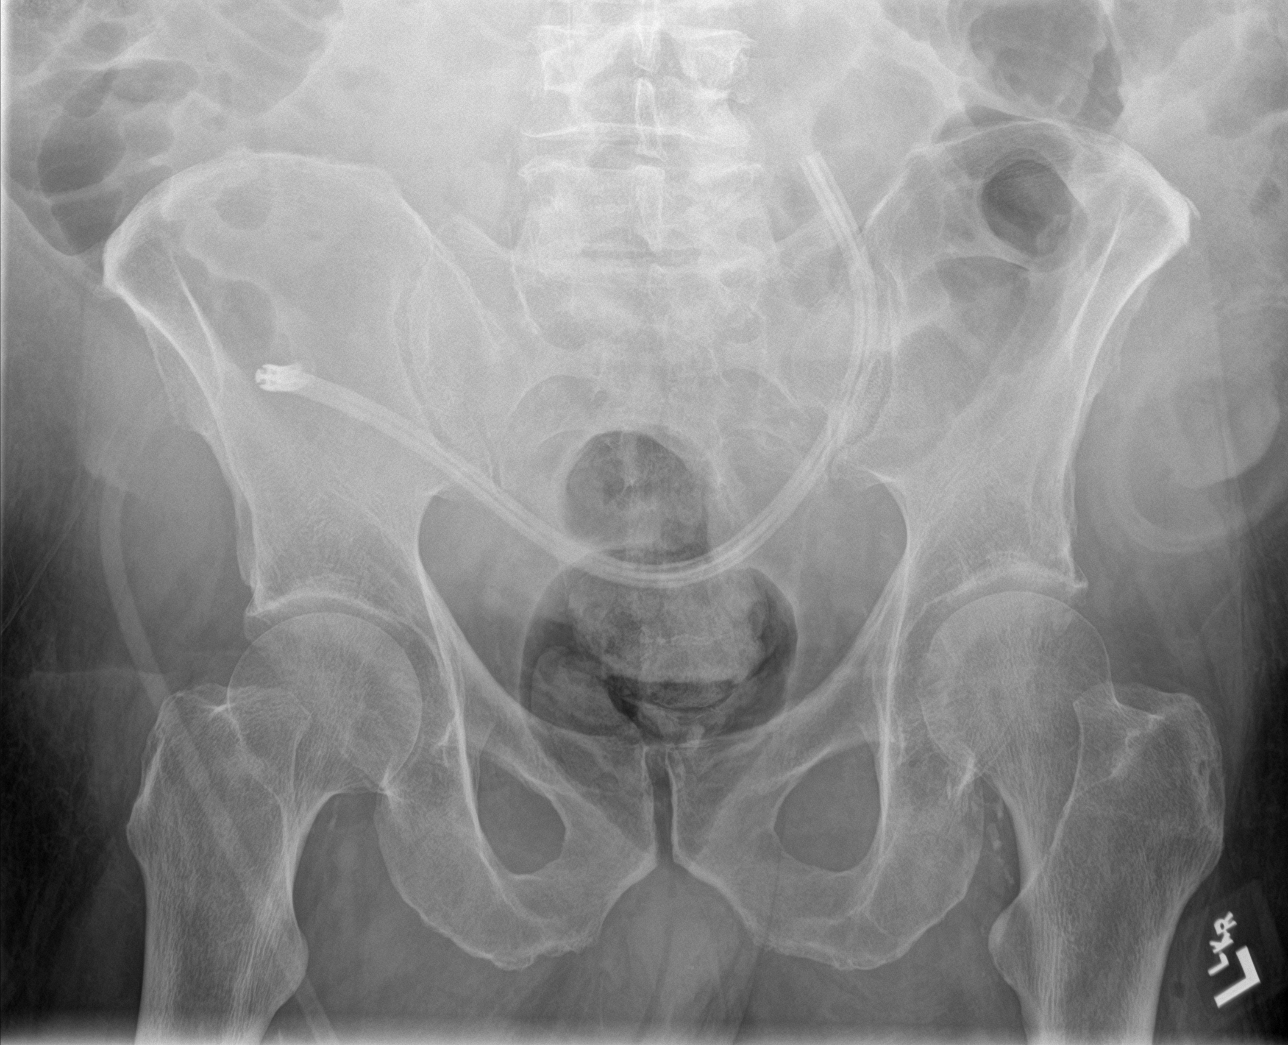

[abdomen supine (2 of 2)]
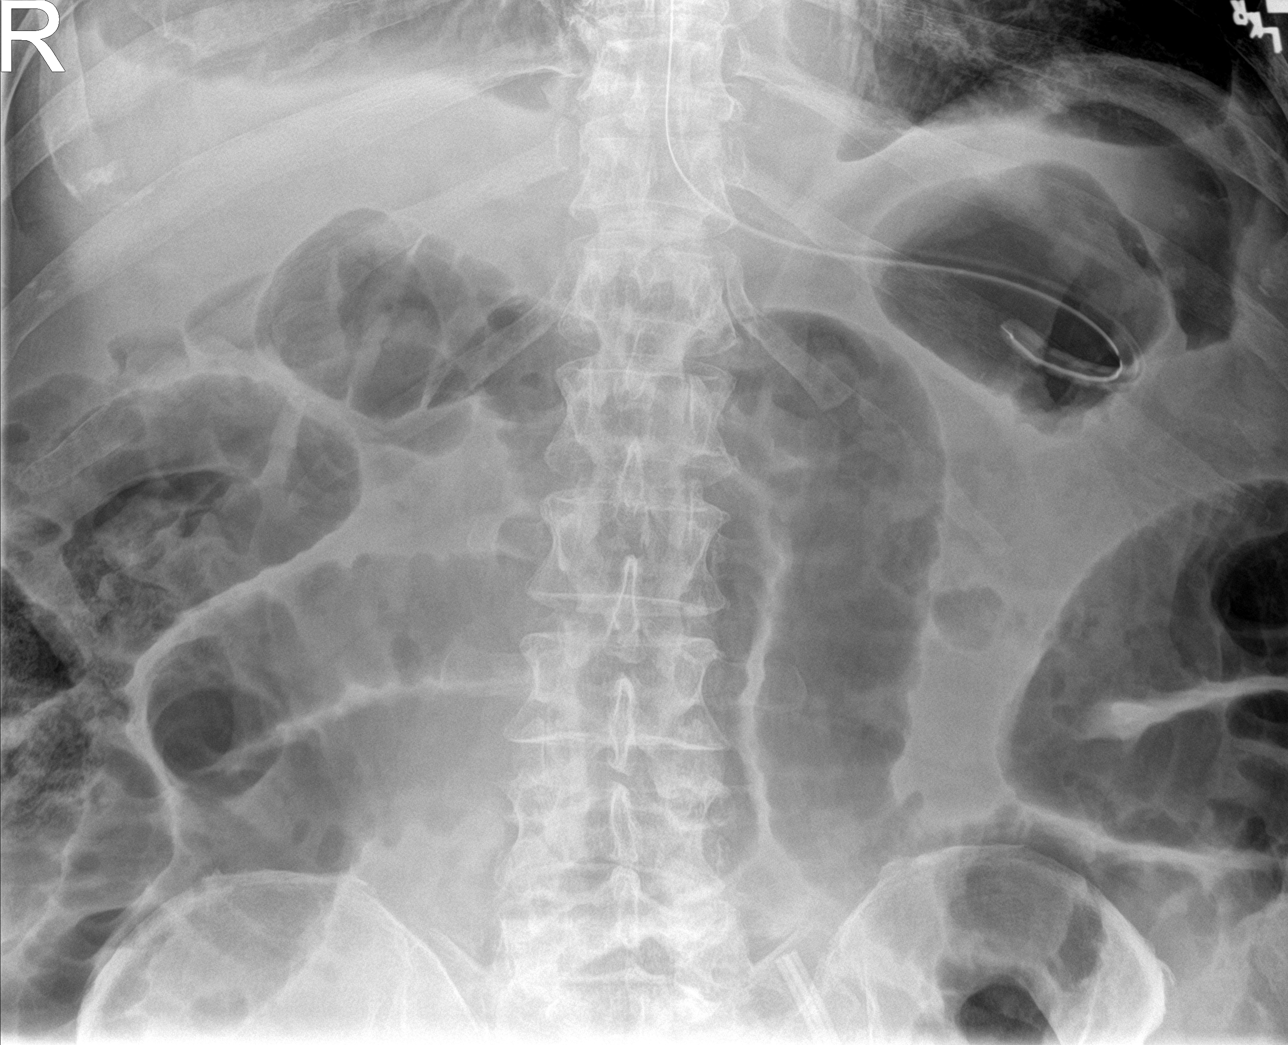

[chest ap]
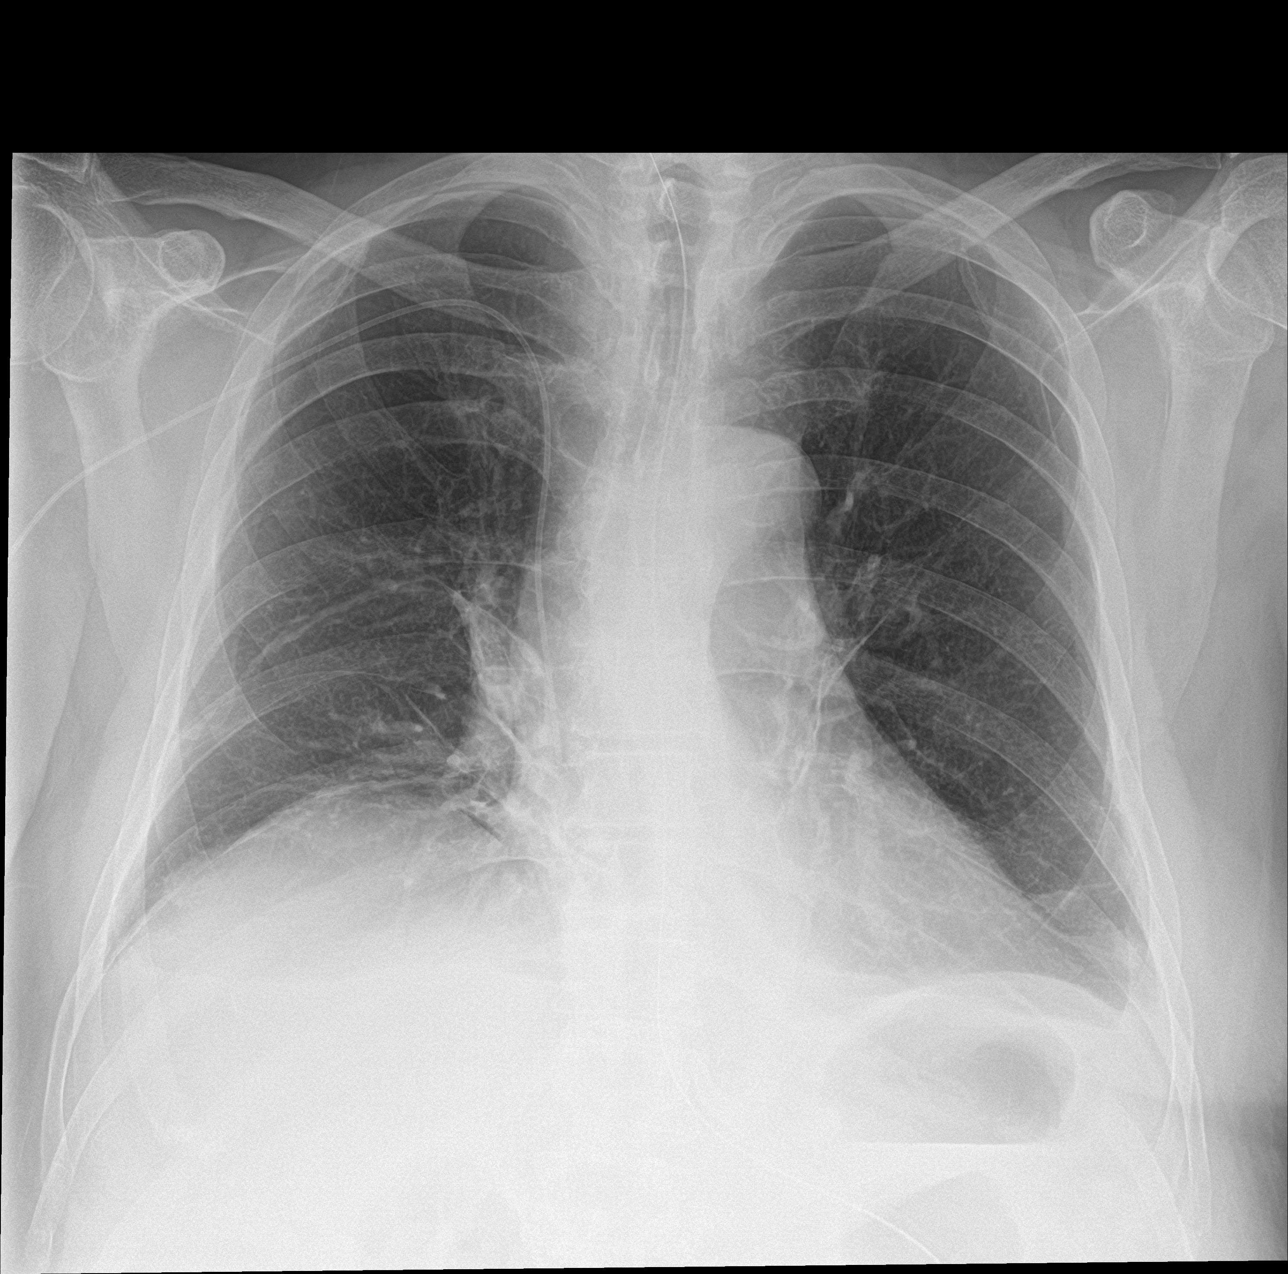

[4 of 4 positions shown; findings below may reference images not displayed]

FINDINGS: Nasogastric tube tip is seen in proximal stomach. Stable bibasilar
subsegmental atelectasis is noted. No free air is noted. Stable
diffuse small bowel dilatation is noted concerning for possible
distal small bowel obstruction or ileus. No significant colonic
dilatation is noted. Surgical drain is noted in the pelvis.
IMPRESSION: Nasogastric tube tip seen in proximal stomach. Stable diffuse small
bowel dilatation is noted concerning for possible distal small bowel
obstruction or ileus.

## 2019-03-17 MED ORDER — M.V.I. ADULT IV INJ
INTRAVENOUS | Status: AC
  Administered 2019-03-17: 18:00:00 via INTRAVENOUS
  Filled 2019-03-17: qty 960

## 2019-03-17 MED ORDER — FAT EMULSION PLANT BASED 20 % IV EMUL
250.0000 mL | INTRAVENOUS | Status: AC
  Administered 2019-03-17: 18:00:00 250 mL via INTRAVENOUS
  Filled 2019-03-17: qty 250

## 2019-03-17 MED ORDER — POTASSIUM PHOSPHATES 15 MMOLE/5ML IV SOLN
20.0000 mmol | Freq: Once | INTRAVENOUS | Status: AC
  Administered 2019-03-17: 11:00:00 20 mmol via INTRAVENOUS
  Filled 2019-03-17: qty 6.67

## 2019-03-17 NOTE — Consult Note (Signed)
PHARMACY CONSULT NOTE - FOLLOW UP  Pharmacy Consult for Electrolyte Monitoring and Replacement   Recent Labs: Potassium (mmol/L)  Date Value  03/17/2019 3.5  07/14/2013 3.6   Magnesium (mg/dL)  Date Value  03/17/2019 2.5 (H)   Calcium (mg/dL)  Date Value  03/17/2019 7.6 (L)   Albumin (g/dL)  Date Value  03/17/2019 2.1 (L)   Phosphorus (mg/dL)  Date Value  03/17/2019 1.5 (L)   Sodium (mmol/L)  Date Value  03/17/2019 137     Assessment: Pharmacy has been consulted for TPN initiation and electrolyte monitoring in 73yo patient.   Goal of Therapy:  Electrolytes WNL  Plan:  Will order 58mmol Potassium Phosphate IV x 1 dose to be administered over 6 hours.   Will recheck electrolytes with AM labs.  Pearla Dubonnet ,PharmD Clinical Pharmacist 03/17/2019 7:23 AM

## 2019-03-17 NOTE — Consult Note (Signed)
[facility] NOTE   Pharmacy Consult for TPN  Indication: prolonged ileus  Patient Measurements: Height: 6\' 1"  (185.4 cm) Weight: 255 lb 15.3 oz (116.1 kg)(bed scale) IBW/kg (Calculated) : 79.9 TPN AdjBW (KG): 90 Body mass index is 33.77 kg/m. Usual Weight: 116.1 kg  Assessment:  Pharmacy consulted for initiation and monitoring of TPN and electrolytes for 73yo patient post Hartmann's Procedure.   GI: LBM 7/20 Endo: SSI 0-9 units Q4 hours Insulin requirements in the past 24 hours: 19u Lytes: K 3.5, Mag 2.5, Phos 1.5(replaced with 23mmol KPhos IV x 1 dose) Renal: Scr 0.97 Pulm:  Cards: amlodipine 10mg  daily, Triamterene-HCTZ 37.5/25mg  Hepatobil: Neuro: ID: Zosyn 7/16>>  TPN Access: PICC right brachial TPN start date: 7/20 Nutritional Goals  After 24 hours if electrolytes, CBGs, and triglycerides are in acceptable range advance to goal regimen of Clinimix 5/20 (assess daily if providing electrolytes or no electrolytes) at 83 mL/hr x 24 hours + 20% ILE at 20 mL/hr x 12 hours.   Goal regimen provides 2233 kcal (97% estimated needs), 100 grams of protein (87% estimated needs), 1992 mL from Clinimix and 240 mL from lipids daily. Unable to meet full calorie/protein needs with Clinimix.  Provide adult MVI daily in TPN and trace elements M/W/F in TPN due to shortage.  Plan is for total fluid volume of 100 mL/hr including TPN.  Measure daily weights.  Goal TPN rate is 83 ml/hr  Current Nutrition:   Plan:  7/21 TPN at 45mL/hr x 24 hours. Lipids at 73ml/hr for 12 hours This TPN provides 192 kcal of protein, 192 g of dextrose, and 248ml of lipids which provides  kCals per day, meeting 97% of patient needs Electrolytes in TPN: none currently Add MVI daily, trace elements every MWF(next due 7/22) No current insulin added to TPN bag. 7/20 IVMF (D5, NS, etc.) at 40 ml/hr Monitor TPN labs, electrolytes(Phos,Mag and K) daily x first 3 days.  Triglycerides F/U daily  Tyrone Luna 03/17/2019,12:07 PM

## 2019-03-17 NOTE — Evaluation (Signed)
Occupational Therapy Evaluation Patient Details Name: Tyrone Luna MRN: [id number] DOB: Mar 26, 1946 Today's Date: 03/17/2019    History of Present Illness Pt. is a 73 y.o. male who presented to hospital ED per reccomendation of PCP for abdominal pain and inability to have BM. He was admitted with acute sigmoid diverticulitis. On 03/15/2019 he underwent laparotomy, drainage of intra-abdominal abscess, Hartmann's procedure and takedown of splenic flexure for perforated diverticulitis w peritonitis. PMH includes Arthritis, BPH associated with nocturia, ED (erectile dysfunction), Erectile dysfunction, Hyperlipemia, Hypertension, Male hypogonadism, Sleep apnea, Urinary frequency, and Urinary urgency, hx of R knee sx, L inguinal hernia repair, and bilateral eye sx.   Clinical Impression   Pt. presents with fatigue following testing this a.m., weakness, limited activity tolerance, and limited functional mobility which hinders his ability to complete basic ADL and IADL functioning. Pt. resides at home with his wife. Pt. was independent with ADLs, and IADL functioning: including meal preparation, medication management, and was able to drive. Pt. reports being very fatigued following testing this a.m. Pt. education was provided about A/E use for LE ADLs, and energy conservation/work simplification techniques. Pt. was provided with a visual handout. Pt. Could benefit from OT services for ADL training, A/E training, and pt. Education about energy conservation, work simplification techniques, home modification, and DME. Pt. would benefit from SNF level of care upon discharge. Pt. could benefit from follow-up OT services at discharge.    Follow Up Recommendations  SNF    Equipment Recommendations    BSCommode   Recommendations for Other Services       Precautions / Restrictions        Mobility Bed Mobility                  Transfers      Deferred 2/2 fatigue following x-ray                Balance                                           ADL either performed or assessed with clinical judgement   ADL Overall ADL's : Needs assistance/impaired Eating/Feeding: NPO   Grooming: Minimal assistance;Set up   Upper Body Bathing: Set up;Minimal assistance   Lower Body Bathing: Set up;Maximal assistance   Upper Body Dressing : Set up;Moderate assistance   Lower Body Dressing: Set up;Maximal assistance                 General ADL Comments: Pt. education was provided about A/E use for LE ADLs, and energy conservation/work simplification     Vision         Perception     Praxis      Pertinent Vitals/Pain Pain Assessment: No/denies pain     Hand Dominance Right   Extremity/Trunk Assessment Upper Extremity Assessment Upper Extremity Assessment: Generalized weakness           Communication Communication Communication: No difficulties   Cognition Arousal/Alertness: Awake/alert Behavior During Therapy: WFL for tasks assessed/performed Overall Cognitive Status: Within Functional Limits for tasks assessed                                     General Comments       Exercises     Shoulder Instructions  Home Living Family/patient expects to be discharged to:: Private residence Living Arrangements: Spouse/significant other Available Help at Discharge: Family Type of Home: House Home Access: Stairs to enter Technical brewer of Steps: 3 Entrance Stairs-Rails: Right Home Layout: Able to live on main level with bedroom/bathroom     Bathroom Shower/Tub: Occupational psychologist: [place] - single point          Prior Functioning/Environment Level of Independence: Independent        Comments: Pt. was independent with ADLs, and IADLs, driving, and is retired.        OT Problem List: Decreased strength;Impaired UE functional use;Decreased activity  tolerance      OT Treatment/Interventions: Self-care/ADL training;Therapeutic exercise;Energy conservation;Therapeutic activities;DME and/or AE instruction;Patient/family education    OT Goals(Current goals can be found in the care plan section)    OT Frequency: Min 2X/week   Barriers to D/C:            Co-evaluation              AM-PAC OT "6 Clicks" Daily Activity     Outcome Measure Help from another person eating meals?: Total Help from another person taking care of personal grooming?: A Little Help from another person toileting, which includes using toliet, bedpan, or urinal?: A Lot Help from another person bathing (including washing, rinsing, drying)?: A Lot Help from another person to put on and taking off regular upper body clothing?: A Little Help from another person to put on and taking off regular lower body clothing?: A Lot 6 Click Score: 13   End of Session    Activity Tolerance: Patient limited by fatigue Patient left: in bed;with call bell/phone within reach;with bed alarm set  OT Visit Diagnosis: Muscle weakness (generalized) (M62.81)                Time: 9379-0240 OT Time Calculation (min): 20 min Charges:  OT General Charges $OT Visit: 1 Visit OT Evaluation $OT Eval Low Complexity: 1 Low  Harrel Carina, MS, OTR/L  Harrel Carina 03/17/2019, 9:49 AM

## 2019-03-17 NOTE — Progress Notes (Signed)
[facility] Day(s): 5.   Post op day(s): 2 Days Post-Op.   Interval History: Patient seen and examined, no acute events or new complaints overnight. Patient reports that he is doing well. He reports abdominal soreness but this is improved compared to prior to surgery. No fever, chills, nausea, or emesis. Leukocytosis and renal function improved. NGT with 450 ccs out in last 24 hours. 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 JP with 370 ccs out. KUB this morning still consistent with ileus. Working with PT & OT recommending HHPT -vs- SNF pending additional evaluations   Vital signs in last 24 hours: [min-max] current  Temp:  [98 F (36.7 C)-98.9 F (37.2 C)] 98 F (36.7 C) (07/21 0452) Pulse Rate:  [64-75] 64 (07/21 0452) Resp:  [20] 20 (07/20 1616) BP: (146-169)/(72-91) 169/72 (07/21 0452) SpO2:  [94 %-96 %] 94 % (07/21 0452)     Height: 6\' 1"  (185.4 cm) Weight: 116.1 kg(bed scale) BMI (Calculated): 33.78   Intake/Output last 2 shifts:  07/20 0701 - 07/21 0700 In: 1780.1 [P.O.:50; I.V.:1362; IV Piggyback:368.1] Out: 2870 [Urine:1800; Emesis/NG output:450; Drains:370; Stool:250]   Physical Exam:  Constitutional: alert, cooperative and no distress  HEENT: NGT in place Respiratory: breathing non-labored at rest  Cardiovascular: regular rate and sinus rhythm  Gastrointestinal: soft, expected post-surgical soreness, and non-distended. No rebound, guarding. Colostomy in LLQ with small amounts of hard stool in bag.  Integumentary: Midline laparotomy incision healing via secondary intention, dressing in place.   Labs:  CBC Latest Ref Rng & Units 03/17/2019 03/16/2019 03/15/2019  WBC 4.0 - 10.5 K/uL 11.3(H) 13.0(H) 13.2(H)  Hemoglobin 13.0 - 17.0 g/dL 13.7 16.6 17.2(H)  Hematocrit 39.0 - 52.0 % 41.0 50.1 51.1  Platelets 150 - 400 K/uL 88(L) 96(L) 94(L)   CMP Latest Ref Rng & Units 03/17/2019 03/16/2019 03/15/2019  Glucose 70 - 99 mg/dL 160(H) 234(H)  105(H)  BUN 8 - 23 mg/dL 30(H) 35(H) 30(H)  Creatinine 0.61 - 1.24 mg/dL 0.97 1.20 1.20  Sodium 135 - 145 mmol/L 137 137 136  Potassium 3.5 - 5.1 mmol/L 3.5 4.4 4.3  Chloride 98 - 111 mmol/L 106 106 100  CO2 22 - 32 mmol/L 24 21(L) 25  Calcium 8.9 - 10.3 mg/dL 7.6(L) 7.4(L) 8.4(L)  Total Protein 6.5 - 8.1 g/dL 5.0(L) - -  Total Bilirubin 0.3 - 1.2 mg/dL 1.6(H) - -  Alkaline Phos 38 - 126 U/L 51 - -  AST 15 - 41 U/L 22 - -  ALT 0 - 44 U/L 23 - -     Imaging studies:   KUB + CXR (03/17/2019) personally reviewed, still appears consistent with ileus, and radiologist report reviewed:  IMPRESSION: Nasogastric tube tip seen in proximal stomach. Stable diffuse small bowel dilatation is noted concerning for possible distal small bowel obstruction or ileus.   Assessment/Plan:  73 y.o. male still with somewhat expected post-surgical ileus 2 Days Post-Op s/p Hartman's Procedure for  Diverticulitis which failed medical management   - Continue NPO + TPN; increase to full rate today  - Continue NGT decompression; monitor output   - IVF rehydration with albumin  -  pain control prn; antiemetics prn             - Monitor abdominal examination; colostomy function; JP output    - Discontinue foley catheter today    - Wet-to-dry dressing changes BID + PRN   - medical management of comorbidities             -  mobilization encouraged; working with PT/OT; recommending SNF             - DVT prophylaxis   All of the above findings and recommendations were discussed with the patient, patient's family (wife via telephone), and the medical team, and all of patient's and family's questions were answered to their expressed satisfaction.  -- Edison Simon, PA-C [facility] 03/17/2019, 11:34 AM [phone] M-F: 7am - 4pm

## 2019-03-17 NOTE — Progress Notes (Signed)
Physical Therapy Treatment Patient Details Name: Tyrone Luna MRN: [id number] DOB: Apr 21, 1946 Today's Date: 03/17/2019    History of Present Illness Pt. is a 73 y.o. male who presented to hospital ED per reccomendation of PCP for abdominal pain and inability to have BM. He was admitted with acute sigmoid diverticulitis. On 03/15/2019 he underwent laparotomy, drainage of intra-abdominal abscess, Hartmann's procedure and takedown of splenic flexure for perforated diverticulitis w peritonitis. PMH includes Arthritis, BPH associated with nocturia, erectile dysfunction, Hyperlipemia, Hypertension, Male hypogonadism, Sleep apnea, Urinary frequency, and Urinary urgency, hx of R knee sx, L inguinal hernia repair, and bilateral eye sx.    PT Comments    Patient received in bed, reports he almost got up earlier but didn't want to be exhausted when I arrived. Patient agrees to PT session. Reports he is doing okay. Requires min assist with bed mobility. Is able to perform sit to stand with min guard and bed height elevated. Ambulated 4 feet over to recliner, performed standing marching and seated exercises in recliner. Patient will benefit from continued skilled PT while here to improve strength, functional mobility and activity tolerance.      Follow Up Recommendations  Home health PT     Equipment Recommendations  Rolling walker with 5" wheels    Recommendations for Other Services       Precautions / Restrictions Precautions Precautions: None Precaution Comments: NG tube, Ostomy bag, drains, abdominal incision. Restrictions Weight Bearing Restrictions: No    Mobility  Bed Mobility Overal bed mobility: Needs Assistance Bed Mobility: Supine to Sit     Supine to sit: Min assist        Transfers Overall transfer level: Needs assistance Equipment used: Rolling walker (2 wheeled) Transfers: Sit to/from Stand Sit to Stand: Min assist;From elevated surface             Ambulation/Gait Ambulation/Gait assistance: Min guard Gait Distance (Feet): 4 Feet Assistive device: Rolling walker (2 wheeled) Gait Pattern/deviations: Decreased stride length;Step-through pattern Gait velocity: reduced   General Gait Details: Patient steady and safe with mobility, fatigues easily.   Stairs             Wheelchair Mobility    Modified Rankin (Stroke Patients Only)       Balance Overall balance assessment: Modified Independent Sitting-balance support: Feet supported Sitting balance-Leahy Scale: Good     Standing balance support: Bilateral upper extremity supported Standing balance-Leahy Scale: Fair Standing balance comment: Patient dependent on RW for support while standing and ambulating.                            Cognition Arousal/Alertness: Awake/alert Behavior During Therapy: WFL for tasks assessed/performed Overall Cognitive Status: Within Functional Limits for tasks assessed                                        Exercises Other Exercises Other Exercises: standing marching at chair x 15 reps, seated laq, ap, hip adb/add, slr x10 reps each bilaterally. Min assist with SLR.    General Comments        Pertinent Vitals/Pain Pain Assessment: No/denies pain    Home Living Family/patient expects to be discharged to:: Private residence Living Arrangements: Spouse/significant other Available Help at Discharge: Family Type of Home: House Home Access: Stairs to enter Entrance Stairs-Rails: Right Home Layout: Able to live on  main level with bedroom/bathroom Home Equipment: Cane - single point      Prior Function Level of Independence: Independent      Comments: Pt. was independent with ADLs, and IADLs, driving, and is retired.   PT Goals (current goals can now be found in the care plan section) Acute Rehab PT Goals Patient Stated Goal: return home, return to PLOF PT Goal Formulation: With patient Time  For Goal Achievement: 03/30/19 Potential to Achieve Goals: Good Progress towards PT goals: Progressing toward goals    Frequency    Min 3X/week      PT Plan Discharge plan needs to be updated    Co-evaluation              AM-PAC PT "6 Clicks" Mobility   Outcome Measure  Help needed turning from your back to your side while in a flat bed without using bedrails?: A Little Help needed moving from lying on your back to sitting on the side of a flat bed without using bedrails?: A Little Help needed moving to and from a bed to a chair (including a wheelchair)?: A Little Help needed standing up from a chair using your arms (e.g., wheelchair or bedside chair)?: A Little Help needed to walk in hospital room?: A Little Help needed climbing 3-5 steps with a railing? : A Lot 6 Click Score: 17    End of Session Equipment Utilized During Treatment: Gait belt;Oxygen Activity Tolerance: Patient limited by fatigue;Patient tolerated treatment well Patient left: in chair;with chair alarm set;with call bell/phone within reach Nurse Communication: Mobility status;Other (comment)(Removed Supplemental O2 from patient as sats were at 95% on room air after exercising. IV beeping due to air in line.) PT Visit Diagnosis: Muscle weakness (generalized) (M62.81);Unsteadiness on feet (R26.81);Difficulty in walking, not elsewhere classified (R26.2)     Time: 2035-5974 PT Time Calculation (min) (ACUTE ONLY): 23 min  Charges:  $Gait Training: 8-22 mins $Therapeutic Exercise: 8-22 mins                     Jeriann Sayres, PT, GCS 03/17/19,12:16 PM

## 2019-03-18 ENCOUNTER — Ambulatory Visit: Payer: Medicare HMO | Admitting: Urology

## 2019-03-18 LAB — CBC
HCT: 39 % (ref 39.0–52.0)
Hemoglobin: 12.7 g/dL — ABNORMAL LOW (ref 13.0–17.0)
MCH: 30.4 pg (ref 26.0–34.0)
MCHC: 32.6 g/dL (ref 30.0–36.0)
MCV: 93.3 fL (ref 80.0–100.0)
Platelets: 101 10*3/uL — ABNORMAL LOW (ref 150–400)
RBC: 4.18 MIL/uL — ABNORMAL LOW (ref 4.22–5.81)
RDW: 14.6 % (ref 11.5–15.5)
WBC: 7.9 10*3/uL (ref 4.0–10.5)
nRBC: 0 % (ref 0.0–0.2)

## 2019-03-18 LAB — GLUCOSE, CAPILLARY
Glucose-Capillary: 119 mg/dL — ABNORMAL HIGH (ref 70–99)
Glucose-Capillary: 143 mg/dL — ABNORMAL HIGH (ref 70–99)
Glucose-Capillary: 114 mg/dL — ABNORMAL HIGH (ref 70–99)
Glucose-Capillary: 132 mg/dL — ABNORMAL HIGH (ref 70–99)
Glucose-Capillary: 138 mg/dL — ABNORMAL HIGH (ref 70–99)

## 2019-03-18 LAB — PHOSPHORUS
Phosphorus: 1.6 mg/dL — ABNORMAL LOW (ref 2.5–4.6)
Phosphorus: 1.9 mg/dL — ABNORMAL LOW (ref 2.5–4.6)

## 2019-03-18 LAB — BASIC METABOLIC PANEL
Anion gap: 7 (ref 5–15)
BUN: 25 mg/dL — ABNORMAL HIGH (ref 8–23)
CO2: 21 mmol/L — ABNORMAL LOW (ref 22–32)
Calcium: 7.5 mg/dL — ABNORMAL LOW (ref 8.9–10.3)
Chloride: 107 mmol/L (ref 98–111)
Creatinine, Ser: 0.76 mg/dL (ref 0.61–1.24)
GFR calc Af Amer: >60 mL/min (ref >60)
GFR calc non Af Amer: >60 mL/min (ref >60)
Glucose, Bld: 140 mg/dL — ABNORMAL HIGH (ref 70–99)
Potassium: 3.4 mmol/L — ABNORMAL LOW (ref 3.5–5.1)
Sodium: 135 mmol/L (ref 135–145)

## 2019-03-18 LAB — MAGNESIUM: Magnesium: 2.3 mg/dL (ref 1.7–2.4)

## 2019-03-18 MED ORDER — FAT EMULSION PLANT BASED 20 % IV EMUL
250.0000 mL | INTRAVENOUS | Status: AC
  Administered 2019-03-18: 18:00:00 250 mL via INTRAVENOUS
  Filled 2019-03-18: qty 250

## 2019-03-18 MED ORDER — TRACE MINERALS CR-CU-MN-SE-ZN 10-1000-500-60 MCG/ML IV SOLN
INTRAVENOUS | Status: AC
  Administered 2019-03-18: 18:00:00 via INTRAVENOUS
  Filled 2019-03-18: qty 960

## 2019-03-18 MED ORDER — POTASSIUM PHOSPHATE MONOBASIC 500 MG PO TABS
1000.0000 mg | ORAL_TABLET | ORAL | Status: AC
  Administered 2019-03-18 (×2): 1000 mg via ORAL
  Filled 2019-03-18 (×2): qty 2

## 2019-03-18 MED ORDER — POTASSIUM PHOSPHATE MONOBASIC 500 MG PO TABS
1000.0000 mg | ORAL_TABLET | Freq: Three times a day (TID) | ORAL | Status: DC
  Administered 2019-03-18 – 2019-03-19 (×3): 1000 mg via ORAL
  Filled 2019-03-18 (×4): qty 2

## 2019-03-18 NOTE — Care Management Important Message (Signed)
Important Message  Patient Details  Name: Tyrone Luna MRN: [id number] Date of Birth: February 17, 1946   Medicare Important Message Given:  Yes     Juliann Pulse A Avamarie Crossley 03/18/2019, 11:09 AM

## 2019-03-18 NOTE — Progress Notes (Addendum)
[facility] Hospital Day(s): 6.   Post op day(s): 3 Days Post-Op.   Interval History: Patient seen and examined, no acute events or new complaints overnight. Patient reports that he is doing better this morning. He is still having incisional soreness but this is improved. No fever, chills, nausea, or emesis. He only had 30 ccs out from colostomy per chart review however there is significant gas in bag. NGT with 600 ccs out. JP with 60 ccs out and serosanguinous. He is working well with PT who are now recommending home health PT. No other acute issues.    Vital signs in last 24 hours: [min-max] current  Temp:  [98 F (36.7 C)-98.4 F (36.9 C)] 98 F (36.7 C) (07/22 0619) Pulse Rate:  [59-62] 62 (07/22 0619) Resp:  [20] 20 (07/21 1911) BP: (143-156)/(67-74) 144/67 (07/22 0619) SpO2:  [93 %-94 %] 93 % (07/22 0619) Weight:  [114.8 kg] 114.8 kg (07/22 0500)     Height: 6\' 1"  (185.4 cm) Weight: 114.8 kg BMI (Calculated): 33.4   Intake/Output last 2 shifts:  07/21 0701 - 07/22 0700 In: 2253.3 [P.O.:100; I.V.:1471.3; IV Piggyback:682] Out: 2915 [Urine:2225; Emesis/NG output:600; Drains:60; Stool:30]   Physical Exam:  Constitutional: alert, cooperative and no distress HEENT:NGT in place Respiratory: breathing non-labored at rest  Cardiovascular: regular rate and sinus rhythm  Gastrointestinal: soft,expected post-surgical soreness, and non-distended. No rebound, guarding. 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 Integumentary:Midline laparotomy incision healing via secondary intention, dressing in place.   Labs:  CBC Latest Ref Rng & Units 03/17/2019 03/16/2019 03/15/2019  WBC 4.0 - 10.5 K/uL 11.3(H) 13.0(H) 13.2(H)  Hemoglobin 13.0 - 17.0 g/dL 13.7 16.6 17.2(H)  Hematocrit 39.0 - 52.0 % 41.0 50.1 51.1  Platelets 150 - 400 K/uL 88(L) 96(L) 94(L)   CMP Latest Ref Rng & Units 03/18/2019 03/17/2019 03/16/2019  Glucose 70 - 99 mg/dL 140(H) 160(H)  234(H)  BUN 8 - 23 mg/dL 25(H) 30(H) 35(H)  Creatinine 0.61 - 1.24 mg/dL 0.76 0.97 1.20  Sodium 135 - 145 mmol/L 135 137 137  Potassium 3.5 - 5.1 mmol/L 3.4(L) 3.5 4.4  Chloride 98 - 111 mmol/L 107 106 106  CO2 22 - 32 mmol/L 21(L) 24 21(L)  Calcium 8.9 - 10.3 mg/dL 7.5(L) 7.6(L) 7.4(L)  Total Protein 6.5 - 8.1 g/dL - 5.0(L) -  Total Bilirubin 0.3 - 1.2 mg/dL - 1.6(H) -  Alkaline Phos 38 - 126 U/L - 51 -  AST 15 - 41 U/L - 22 -  ALT 0 - 44 U/L - 23 -     Imaging studies: No new pertinent imaging studies   Assessment/Plan:  73 y.o. male slowly improving now with gas in colostomy bag and likely resolving post-surgical ileus 3 Days Post-Op s/p Hartman's Procedurefor Diverticulitis which failed medical management   - Continue NPO + TPN; remains at 1/2 rate secondary to hypophosphatemia, appreciate pharmacy + dietitian help with this             - Continue NGT decompression; monitor output; hopefully for only 24 more hours or sooner as colostomy function returns                    - pain control prn; antiemetics prn - Monitor abdominal examination; colostomy function; JP output              - Continue Wet-to-dry dressing changes BID + PRN              -  medical management of comorbidities - mobilization encouraged; working with PT/OT; recommending HHPT - DVT prophylaxis  All of the above findings and recommendations were discussed with the patient, patient's family (wife via telephone), and the medical team, and all of patient's and family's questions were answered to their expressed satisfaction.  -- Edison Simon, PA-C [facility] 03/18/2019, 7:24 AM [phone] M-F: 7am - 4pm

## 2019-03-18 NOTE — Progress Notes (Signed)
Dressing to abdomen changed per orders, Tyrone Ree PA made aware that dressing changed performed, states that he and Dr Dahlia Byes will round on pt and look at the incision during rounds

## 2019-03-18 NOTE — Consult Note (Signed)
[place] Nurse ostomy follow up Patient receiving care in [facility] 108 Stoma type/location: RLQ colostomy slightly oval 1 3/8 inches budded, moist, sutures intact, dark red Peristomal assessment: intact Treatment options for stomal/peristomal skin: barrier ring Output: drops of serosanginous in pouch  Ostomy pouching: 2pc. Flat 2/14 inch with barrier ring Education provided: [employer].  Patient successfully opened/close a sample pouch and observed all steps of the pouch system change process. Enrolled patient in [place] Start Discharge program: Yes Val Riles, RN, MSN, [facility], CNS-BC, pager [phone]

## 2019-03-18 NOTE — Progress Notes (Signed)
PT Cancellation Note  Patient Details Name: TAVARIOUS FREEL MRN: [id number] DOB: 06-Jan-1946   Cancelled Treatment:    Reason Eval/Treat Not Completed: Other (comment). Treatment attempted this afternoon; staff with pt and notes that it will be an hour or more before pt available. Re attempt at a later time/date as the schedule allows.    Larae Grooms, PTA 03/18/2019, 2:27 PM

## 2019-03-18 NOTE — TOC Initial Note (Addendum)
Transition of Care [facility]) - Initial/Assessment Note    Patient Details  Name: Tyrone Luna MRN: [id number] Date of Birth: February 17, 1946  Transition of Care [facility] CM/SW Contact:    Candie Chroman, LCSW Phone Number: 03/18/2019, 9:53 AM  Clinical Narrative: CSW met with patient, introduced role, and explained that discharge planning would be discussed. Patient agreeable to HHPT, OT, RN, and aide. Provided CMS list for agencies that serve his zip code. First preference is [facility]. Referral made to [facility]. DME recommendations for 3-in-1 and rolling walker. Will order closer to discharge. Per PA, patient will not discharge with TPN, only wet-to-dry dressings. Anticipated discharge after the weekend. No further concerns. CSW encouraged patient to contact CSW as needed. CSW will continue to follow patient for support and facilitate return home once medically stable.                12:24 pm: Patient has been accepted by [facility].  Expected Discharge Plan: [place] Barriers to Discharge: Continued Medical Work up   Patient Goals and CMS Choice   CMS Medicare.gov Compare Post Acute Care list provided to:: Patient    Expected Discharge Plan and Services Expected Discharge Plan: [place] Choice: [place] arrangements for the past 2 months: Single Family Home                 DME Arranged: 3-N-1, Walker rolling         HH Arranged: RN, PT, OT, Nurse's Aide          Prior Living Arrangements/Services Living arrangements for the past 2 months: Single Family Home Lives with:: Spouse Patient language and need for interpreter reviewed:: Yes Do you feel safe going back to the place where you live?: Yes      Need for Family Participation in Patient Care: Yes (Comment) Care giver support system in place?: Yes (comment)   Criminal Activity/Legal Involvement Pertinent to Current  Situation/Hospitalization: No - Comment as needed  Activities of Daily Living Home Assistive Devices/Equipment: None ADL Screening (condition at time of admission) Patient's cognitive ability adequate to safely complete daily activities?: Yes Is the patient deaf or have difficulty hearing?: No Does the patient have difficulty seeing, even when wearing glasses/contacts?: No Does the patient have difficulty concentrating, remembering, or making decisions?: No Patient able to express need for assistance with ADLs?: Yes Does the patient have difficulty dressing or bathing?: No Independently performs ADLs?: Yes (appropriate for developmental age) Does the patient have difficulty walking or climbing stairs?: No Weakness of Legs: None Weakness of Arms/Hands: None  Permission Sought/Granted Permission sought to share information with : Facility Art therapist granted to share information with : Yes, Verbal Permission Granted     Permission granted to share info w AGENCY: [facility]        Emotional Assessment Appearance:: Appears stated age Attitude/Demeanor/Rapport: Engaged, Gracious Affect (typically observed): Accepting, Appropriate, Calm, Pleasant Orientation: : Oriented to Self, Oriented to Place, Oriented to  Time, Oriented to Situation Alcohol / Substance Use: Never Used Psych Involvement: No (comment)  Admission diagnosis:  Perforated diverticulum [K57.80] Sepsis ([place]) [A41.9] Diverticulitis large intestine [K57.32] Patient Active Problem List   Diagnosis Date Noted  . Diverticulitis large intestine 03/12/2019  . Morbid obesity due to excess calories ([place]) 05/18/2015  . BPH with obstruction/lower urinary tract symptoms 05/05/2015  . Nocturia 05/05/2015  . Erectile dysfunction  of organic origin 05/05/2015  . ED (erectile dysfunction) of organic origin 03/22/2015  . Allergy to environmental factors 03/22/2015  . Hyperlipidemia, unspecified  03/22/2015  . BP (high blood pressure) 03/22/2015  . 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 Apnea, sleep 03/22/2015   PCP:  [name], MD Pharmacy:   [facility] [address], [place] - [place] [address] [place] [address] Phone: [phone] Fax: [phone]  [place], [place] - [place] [address] Phone: [phone] Fax: [phone]     Social Determinants of Health (SDOH) Interventions    Readmission Risk Interventions No flowsheet data found.

## 2019-03-18 NOTE — Consult Note (Signed)
[place] NOTE   Pharmacy Consult for TPN  Indication: prolonged ileus  Patient Measurements: Height: 6\' 1"  (185.4 cm) Weight: 253 lb 1.4 oz (114.8 kg) IBW/kg (Calculated) : 79.9 TPN AdjBW (KG): 90 Body mass index is 33.39 kg/m. Usual Weight: 116.1 kg  Assessment:  Pharmacy consulted for initiation and monitoring of TPN and electrolytes for 73yo patient post Hartmann's Procedure.   GI: LBM 7/20 Endo: SSI 0-9 units Q4 hours Insulin requirements in the past 24 hours: 6u Lytes: K 3.5, Mag 2.4, Phos 1.9(replaced with 1g KPhos PO x 2 doses) Renal: Scr 0.76 Pulm:  Cards: amlodipine 10mg  daily, Triamterene-HCTZ 37.5/25mg  Hepatobil: Neuro: ID: Zosyn 7/16>>  TPN Access: PICC right brachial TPN start date: 7/20 Nutritional Goals  After 24 hours if electrolytes, CBGs, and triglycerides are in acceptable range advance to goal regimen of Clinimix 5/20 (assess daily if providing electrolytes or no electrolytes) at 83 mL/hr x 24 hours + 20% ILE at 20 mL/hr x 12 hours.   Goal regimen provides 2233 kcal (97% estimated needs), 100 grams of protein (87% estimated needs), 1992 mL from Clinimix and 240 mL from lipids daily. Unable to meet full calorie/protein needs with Clinimix.  Provide adult MVI daily in TPN and trace elements M/W/F in TPN due to shortage.  Plan is for total fluid volume of 100 mL/hr including TPN.  Measure daily weights.  Goal TPN rate is 83 ml/hr  Current Nutrition:   Plan:  7/22 TPN(w/ electrolytes) at 102mL/hr x 24 hours. Lipids at 90ml/hr for 12 hours This TPN provides 192 kcal of protein, 192 g of dextrose, and 25ml of lipids which provides  kCals per day, meeting 97% of patient needs Electrolytes in TPN: none currently Added Thiamine 100mg  x 3 days(starting 7/22) Add MVI daily, trace elements every MWF(next due 7/24) No current insulin added to TPN bag. 7/20 IVMF (D5, NS, etc.) at 40 ml/hr Monitor TPN labs,  electrolytes(Phos,Mag and K) daily x first 3 days. Triglycerides F/U daily  Rayley Gao A Destry Dauber 03/18/2019,1:56 PM

## 2019-03-19 LAB — CBC
HCT: 39.5 % (ref 39.0–52.0)
Hemoglobin: 13.3 g/dL (ref 13.0–17.0)
MCH: 30.6 pg (ref 26.0–34.0)
MCHC: 33.7 g/dL (ref 30.0–36.0)
MCV: 90.8 fL (ref 80.0–100.0)
Platelets: 113 10*3/uL — ABNORMAL LOW (ref 150–400)
RBC: 4.35 MIL/uL (ref 4.22–5.81)
RDW: 13.8 % (ref 11.5–15.5)
WBC: 7 10*3/uL (ref 4.0–10.5)
nRBC: 0 % (ref 0.0–0.2)

## 2019-03-19 LAB — COMPREHENSIVE METABOLIC PANEL
ALT: 44 U/L (ref 0–44)
AST: 41 U/L (ref 15–41)
Albumin: 2.1 g/dL — ABNORMAL LOW (ref 3.5–5.0)
Alkaline Phosphatase: 65 U/L (ref 38–126)
Anion gap: 8 (ref 5–15)
BUN: 20 mg/dL (ref 8–23)
CO2: 21 mmol/L — ABNORMAL LOW (ref 22–32)
Calcium: 7.8 mg/dL — ABNORMAL LOW (ref 8.9–10.3)
Chloride: 107 mmol/L (ref 98–111)
Creatinine, Ser: 0.84 mg/dL (ref 0.61–1.24)
GFR calc Af Amer: >60 mL/min (ref >60)
GFR calc non Af Amer: >60 mL/min (ref >60)
Glucose, Bld: 134 mg/dL — ABNORMAL HIGH (ref 70–99)
Potassium: 3.3 mmol/L — ABNORMAL LOW (ref 3.5–5.1)
Sodium: 136 mmol/L (ref 135–145)
Total Bilirubin: 2.6 mg/dL — ABNORMAL HIGH (ref 0.3–1.2)
Total Protein: 5.3 g/dL — ABNORMAL LOW (ref 6.5–8.1)

## 2019-03-19 LAB — MAGNESIUM: Magnesium: 2.1 mg/dL (ref 1.7–2.4)

## 2019-03-19 LAB — PHOSPHORUS: Phosphorus: 2.5 mg/dL (ref 2.5–4.6)

## 2019-03-19 LAB — SURGICAL PATHOLOGY

## 2019-03-19 LAB — GLUCOSE, CAPILLARY
Glucose-Capillary: 119 mg/dL — ABNORMAL HIGH (ref 70–99)
Glucose-Capillary: 135 mg/dL — ABNORMAL HIGH (ref 70–99)
Glucose-Capillary: 135 mg/dL — ABNORMAL HIGH (ref 70–99)
Glucose-Capillary: 138 mg/dL — ABNORMAL HIGH (ref 70–99)
Glucose-Capillary: 138 mg/dL — ABNORMAL HIGH (ref 70–99)
Glucose-Capillary: 140 mg/dL — ABNORMAL HIGH (ref 70–99)

## 2019-03-19 MED ORDER — POTASSIUM CHLORIDE 10 MEQ/100ML IV SOLN
10.0000 meq | INTRAVENOUS | Status: AC
  Administered 2019-03-19 (×2): 10 meq via INTRAVENOUS
  Filled 2019-03-19 (×2): qty 100

## 2019-03-19 MED ORDER — THIAMINE HCL 100 MG/ML IJ SOLN
INTRAVENOUS | Status: AC
  Administered 2019-03-19: 18:00:00 via INTRAVENOUS
  Filled 2019-03-19: qty 1992

## 2019-03-19 MED ORDER — FAT EMULSION PLANT BASED 20 % IV EMUL
250.0000 mL | INTRAVENOUS | Status: AC
  Administered 2019-03-19: 18:00:00 250 mL via INTRAVENOUS
  Filled 2019-03-19: qty 250

## 2019-03-19 MED ORDER — SODIUM CHLORIDE 0.9 % IV SOLN
INTRAVENOUS | Status: DC
  Administered 2019-03-19 – 2019-03-21 (×2): via INTRAVENOUS

## 2019-03-19 NOTE — Consult Note (Signed)
[place] NOTE   Pharmacy Consult for TPN  Indication: prolonged ileus  Patient Measurements: Height: 6\' 1"  (185.4 cm) Weight: 247 lb 5.7 oz (112.2 kg) IBW/kg (Calculated) : 79.9 TPN AdjBW (KG): 90 Body mass index is 32.63 kg/m. Usual Weight: 116.1 kg  Assessment:  Pharmacy consulted for initiation and monitoring of TPN and electrolytes for 73yo patient post Hartmann's Procedure.   GI: LBM 7/22 Endo: SSI 0-9 units Q4 hours Insulin requirements in the past 24 hours: 5u Lytes: K 3.3(replaced with KCl 69mEq IV x 2 doses), Mag 2.4, Phos 2.5(replaced with 1g KPhos PO x 2 doses) Renal: Scr 0.84 Pulm:  Cards: amlodipine 10mg  daily, Triamterene-HCTZ 37.5/25mg  Hepatobil: Neuro: ID: Zosyn 7/16>>  TPN Access: PICC right brachial TPN start date: 7/20 Nutritional Goals  After 24 hours if electrolytes, CBGs, and triglycerides are in acceptable range advance to goal regimen of Clinimix 5/20 (assess daily if providing electrolytes or no electrolytes) at 83 mL/hr x 24 hours + 20% ILE at 20 mL/hr x 12 hours.   Goal regimen provides 2233 kcal (97% estimated needs), 100 grams of protein (87% estimated needs), 1992 mL from Clinimix and 240 mL from lipids daily. Unable to meet full calorie/protein needs with Clinimix.  Provide adult MVI daily in TPN and trace elements M/W/F in TPN due to shortage.  Plan is for total fluid volume of 100 mL/hr including TPN.  Measure daily weights.  Goal TPN rate is 83 ml/hr  Current Nutrition:   Plan:  7/23 Increase TPN(w/ electrolytes) to goal 39mL/hr x 24 hours. Lipids at 10ml/hr for 12 hours Provides 2233 kcal (97% estimated needs), 100 grams of protein (87% estimated needs), 1992 mL from Clinimix and 240 mL from lipids daily. Unable to meet full calorie/protein needs with Clinimix. Electrolytes in TPN: none currently Added Thiamine 100mg  x 3 days(end date 7/24) Add MVI daily, trace elements every MWF(next  due 7/24) No current insulin added to TPN bag. 7/23 IVMF (D5, NS, etc.) at 17 ml/hr Monitor TPN labs, electrolytes(Phos,Mag and K) daily x first 3 days. Triglycerides F/U daily  Pearla Dubonnet, PharmD Clinical Pharmacist 03/19/2019 11:43 AM

## 2019-03-19 NOTE — Progress Notes (Signed)
[place] Hospital Day(s): 7.   Post op day(s): 4 Days Post-Op.   Interval History: Patient seen and examined, no acute events or new complaints overnight. Patient reports that he is feeling better this morning. No reports of abdominal pain, nausea, emesis, fever, or chills. NGT output not recorded but appears to be around 100 ccs. TPN remained at 1/2 rate secondary to hypophosphatemia but this has resolved. Mild hypokalemia to 3.3. Colostomy with minimal gas only. No stool. Otherwise no complaints.    Vital signs in last 24 hours: [min-max] current  Temp:  [98.2 F (36.8 C)-98.4 F (36.9 C)] 98.2 F (36.8 C) (07/23 0429) Pulse Rate:  [56-65] 56 (07/23 0429) Resp:  [15-17] 15 (07/23 0429) BP: (145-155)/(69-78) 146/76 (07/23 0429) SpO2:  [95 %-100 %] 100 % (07/23 0429) Weight:  [112.2 kg] 112.2 kg (07/23 0500)     Height: 6\' 1"  (185.4 cm) Weight: 112.2 kg BMI (Calculated): 32.64   Intake/Output last 2 shifts:  07/22 0701 - 07/23 0700 In: 2349.9 [I.V.:2223.4; IV Piggyback:126.5] Out: 1898 [Urine:1830; Drains:68]   Physical Exam:  Constitutional: alert, cooperative and no distress HEENT:NGT in place Respiratory: breathing non-labored at rest  Cardiovascular: regular rate and sinus rhythm  Gastrointestinal: soft,expected post-surgical soreness, and non-distended. No rebound, guarding. 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 Integumentary:Midline laparotomy incision healing via secondary intention, dressing in place.   Labs:  CBC Latest Ref Rng & Units 03/19/2019 03/18/2019 03/17/2019  WBC 4.0 - 10.5 K/uL 7.0 7.9 11.3(H)  Hemoglobin 13.0 - 17.0 g/dL 13.3 12.7(L) 13.7  Hematocrit 39.0 - 52.0 % 39.5 39.0 41.0  Platelets 150 - 400 K/uL 113(L) 101(L) 88(L)   CMP Latest Ref Rng & Units 03/19/2019 03/18/2019 03/17/2019  Glucose 70 - 99 mg/dL 134(H) 140(H) 160(H)  BUN 8 - 23 mg/dL 20 25(H) 30(H)  Creatinine 0.61 - 1.24 mg/dL 0.84 0.76 0.97   Sodium 135 - 145 mmol/L 136 135 137  Potassium 3.5 - 5.1 mmol/L 3.3(L) 3.4(L) 3.5  Chloride 98 - 111 mmol/L 107 107 106  CO2 22 - 32 mmol/L 21(L) 21(L) 24  Calcium 8.9 - 10.3 mg/dL 7.8(L) 7.5(L) 7.6(L)  Total Protein 6.5 - 8.1 g/dL 5.3(L) - 5.0(L)  Total Bilirubin 0.3 - 1.2 mg/dL 2.6(H) - 1.6(H)  Alkaline Phos 38 - 126 U/L 65 - 51  AST 15 - 41 U/L 41 - 22  ALT 0 - 44 U/L 44 - 23     Imaging studies: No new pertinent imaging studies   Assessment/Plan:  73 y.o. male clinically improving slowly with decrease in abdominal distension and NGT output with post-surgical ileus 4 Days Post-Op s/p Hartman's Procedurefor Diverticulitis which failed medical management   - Will do clamping trial this moring. NGT to be clamped for 6 hours. After 6 hours check residuals. If residuals <150 cc, okay to remove NGT  - NPO + TPN; advance to goal today  - pain control prn; antiemetics prn - Monitor abdominal examination; colostomy function; JP output - Continue Wet-to-dry dressing changes BID + PRN - medical management of comorbidities - mobilization encouraged;working with PT/OT; recommending HHPT - DVT prophylaxis  All of the above findings and recommendations were discussed with the patient, patient's family (wife - via telephone), and the medical team, and all of patient's and family's questions were answered to their expressed satisfaction.  -- Edison Simon, PA-C [facility] 03/19/2019, 7:44 AM [phone] M-F: 7am - 4pm

## 2019-03-19 NOTE — Progress Notes (Signed)
Nutrition Follow-up  DOCUMENTATION CODES:   Obesity unspecified  INTERVENTION:  Advance to goal TPN regimen of Clinimix E 5/20 at 83 mL/hr x 24 hrs + 20% ILE at 20 mL/hr x 12 hrs. Provides 2233 kcal (97% estimated needs), 100 grams of protein (87% estimated needs), 1993 mL from Clinimix and 240 mL from lipids daily. Unable to meet full calorie/protein needs with Clinimix.  Continue adult MVI daily in TPN and trace elements M/W/F in TPN due to shortage. Continue thiamine 100 mg daily in TPN for total of 3 days (last dose 7/24).  Plan is for total fluid volume of 100 mL/hr including TPN.  NUTRITION DIAGNOSIS:   Inadequate oral intake related to inability to eat as evidenced by NPO status.  Ongoing - addressing with TPN.  GOAL:   Patient will meet greater than or equal to 90% of their needs  Progressing with advancement of TPN.  MONITOR:   Diet advancement, Labs, Weight trends, Skin, I & O's  REASON FOR ASSESSMENT:   Consult New TPN/TNA  ASSESSMENT:   73 year old male with PMHx of arthritis, sleep apnea, HTN, BPH admitted with perforated diverticulitis with peritonitis s/p laparotomy, drainage of intra-abdominal abscess, Hartmann's procedure, and takedown of splenic flexure on 7/19.  Patient has continued on 1/2 rate of TPN due to hypophosphatemia and refeeding syndrome. Pharmacy has been monitoring/replacing and phosphorus level now WNL today so plan is to advance to goal rate. Yesterday electrolytes were added into TPN as magnesium had decreased and was WNL. Thiamine was also added into TPN to assist with refeeding and plan is for 3 days of thiamine. Per documentation in chart patient has had some type 7 output per colostomy.  IV Access: right brachial double lumen PICC placed 7/20; ECG technology used to confirm PICC in SVC  TPN: Clinimix E 5/20 at 40 mL/hr x 24 hrs + 20% ILE at 20 mL/hr x 12 hrs + adult MVI 10 mL daily + trace elements 1 mL M/W/F due to shortage + thiamine  100 mg daily x 3 days  Medications reviewed and include: Novolog 0-9 units Q4hrs, pantoprazole, NS at 40 mL/hr, Zosyn, potassium chloride 10 mEq IV 2 times today.  Labs reviewed: CBG 135-140, Potassium 3.3, CO2 21, Phosphorus 2.5, Magnesium 2.1.  I/O: 1830 mL UOP yesterday (0.7 mL/kg/hr) + 4 occurrences unmeasured UOP; 68 mL output from JP drain  Weight trend: 112.2 kg on 7/23; -3.9 kg from 7/20  Diet Order:   Diet Order            Diet NPO time specified Except for: Ice Chips, Sips with Meds  Diet effective now             EDUCATION NEEDS:   Not appropriate for education at this time  Skin:  Skin Assessment: Skin Integrity Issues:(closed incision to abdomen)  Last BM:  03/18/2019 - type 7 per colostomy  Height:   Ht Readings from Last 1 Encounters:  03/12/19 6\' 1"  (1.854 m)   Weight:   Wt Readings from Last 1 Encounters:  03/19/19 112.2 kg   Ideal Body Weight:  83.6 kg  BMI:  Body mass index is 32.63 kg/m.  Estimated Nutritional Needs:   Kcal:  2025-4270  Protein:  115-125 grams  Fluid:  2-2.5 L/day  Willey Blade, MS, RD, LDN Office: [phone] Pager: [phone] After Hours/Weekend Pager: [phone]

## 2019-03-20 LAB — GLUCOSE, CAPILLARY
Glucose-Capillary: 133 mg/dL — ABNORMAL HIGH (ref 70–99)
Glucose-Capillary: 135 mg/dL — ABNORMAL HIGH (ref 70–99)
Glucose-Capillary: 139 mg/dL — ABNORMAL HIGH (ref 70–99)
Glucose-Capillary: 145 mg/dL — ABNORMAL HIGH (ref 70–99)
Glucose-Capillary: 153 mg/dL — ABNORMAL HIGH (ref 70–99)
Glucose-Capillary: 161 mg/dL — ABNORMAL HIGH (ref 70–99)
Glucose-Capillary: 166 mg/dL — ABNORMAL HIGH (ref 70–99)

## 2019-03-20 LAB — BASIC METABOLIC PANEL
Anion gap: 9 (ref 5–15)
BUN: 18 mg/dL (ref 8–23)
CO2: 20 mmol/L — ABNORMAL LOW (ref 22–32)
Calcium: 8 mg/dL — ABNORMAL LOW (ref 8.9–10.3)
Chloride: 106 mmol/L (ref 98–111)
Creatinine, Ser: 0.87 mg/dL (ref 0.61–1.24)
GFR calc Af Amer: >60 mL/min (ref >60)
GFR calc non Af Amer: >60 mL/min (ref >60)
Glucose, Bld: 154 mg/dL — ABNORMAL HIGH (ref 70–99)
Potassium: 3.4 mmol/L — ABNORMAL LOW (ref 3.5–5.1)
Sodium: 135 mmol/L (ref 135–145)

## 2019-03-20 LAB — MAGNESIUM: Magnesium: 2.1 mg/dL (ref 1.7–2.4)

## 2019-03-20 LAB — PHOSPHORUS: Phosphorus: 2.5 mg/dL (ref 2.5–4.6)

## 2019-03-20 MED ORDER — POTASSIUM CHLORIDE CRYS ER 20 MEQ PO TBCR
40.0000 meq | EXTENDED_RELEASE_TABLET | Freq: Once | ORAL | Status: AC
  Administered 2019-03-20: 40 meq via ORAL
  Filled 2019-03-20: qty 2

## 2019-03-20 MED ORDER — TRACE MINERALS CR-CU-MN-SE-ZN 10-1000-500-60 MCG/ML IV SOLN
INTRAVENOUS | Status: AC
  Administered 2019-03-20: 18:00:00 via INTRAVENOUS
  Filled 2019-03-20: qty 1992

## 2019-03-20 MED ORDER — FAT EMULSION PLANT BASED 20 % IV EMUL
250.0000 mL | INTRAVENOUS | Status: AC
  Administered 2019-03-20: 18:00:00 250 mL via INTRAVENOUS
  Filled 2019-03-20: qty 250

## 2019-03-20 MED ORDER — POTASSIUM CHLORIDE 10 MEQ/100ML IV SOLN: 10.0000 meq | Freq: Once | INTRAVENOUS | Status: DC

## 2019-03-20 NOTE — Progress Notes (Signed)
Occupational Therapy Treatment Patient Details Name: Tyrone Luna MRN: [id number] DOB: 1946/05/15 Today's Date: 03/20/2019    History of present illness Pt. is a 73 y.o. male who presented to hospital ED per reccomendation of PCP for abdominal pain and inability to have BM. He was admitted with acute sigmoid diverticulitis. On 03/15/2019 he underwent laparotomy, drainage of intra-abdominal abscess, Hartmann's procedure and takedown of splenic flexure for perforated diverticulitis w peritonitis. PMH includes Arthritis, BPH associated with nocturia, erectile dysfunction, Hyperlipemia, Hypertension, Male hypogonadism, Sleep apnea, Urinary frequency, and Urinary urgency, hx of R knee sx, L inguinal hernia repair, and bilateral eye sx.   OT comments  Pt seen for OT treatment on this date. Upon arrival to room pt awake/alert semi-supine in bed. Pt A&O x 4 reporting some pian in his abdomen and increased fatigue from "being up in the chair all day". Pt declined OOB but and agreeable to OT education on AE for LB dressing and pain mgt. Pt instructed in use of the reacher and sock aid to promote improved functional independence and minimize abdominal pain during ADL tasks. Pt declined to trial AE on this date. Pt currently requiring max assist for LB ADL tasks at this time due to increased pain and limited ROM from his ostomy and abdominal incision site. Pt verbalized understanding instruction provided. Pt making progress toward goals and continues to benefit from skilled OT services to maximize return to PLOF and minimize risk of future falls, injury, caregiver burden, and readmission. Will continue to follow POC. Discharge recommendation remains appropriate.    Follow Up Recommendations  SNF    Equipment Recommendations       Recommendations for Other Services      Precautions / Restrictions Precautions Precautions: Other (comment) Precaution Comments: Ostomy bag, drains, abdominal  incision. Restrictions Weight Bearing Restrictions: No       Mobility Bed Mobility Overal bed mobility: Needs Assistance Bed Mobility: Supine to Sit     Supine to sit: Min assist     General bed mobility comments: Deferred, pt declined OOB on this date. Endorsed fatigue/increased pain.  Transfers Overall transfer level: Needs assistance Equipment used: Rolling walker (2 wheeled) Transfers: Sit to/from Stand Sit to Stand: Min assist;From elevated surface              Balance Overall balance assessment: Modified Independent Sitting-balance support: Feet supported Sitting balance-Leahy Scale: Good     Standing balance support: Bilateral upper extremity supported Standing balance-Leahy Scale: Fair Standing balance comment: Patient dependent on RW for support while standing and ambulating.                           ADL either performed or assessed with clinical judgement   ADL Overall ADL's : Needs assistance/impaired Eating/Feeding: Set up;Sitting Eating/Feeding Details (indicate cue type and reason): Pt on liquid diet. Grooming: Minimal assistance;Set up;Sitting   Upper Body Bathing: Set up;Minimal assistance;Sitting   Lower Body Bathing: Set up;Maximal assistance;Sitting/lateral leans   Upper Body Dressing : Set up;Moderate assistance;Sitting;With adaptive equipment   Lower Body Dressing: Set up;Maximal assistance Lower Body Dressing Details (indicate cue type and reason): Pt educated in AE for LB dressing including use of reacher and sock aid. Pt declined to trial on this date. Verbalized understanding of education provided. Was unable to recall prior education on AE for LB ADL tasks.  Vision       Perception     Praxis      Cognition Arousal/Alertness: Awake/alert Behavior During Therapy: WFL for tasks assessed/performed Overall Cognitive Status: Within Functional Limits for tasks assessed                                           Exercises Other Exercises Other Exercises: Pt educated on safe use of AE for LB ADL tasks. Pt declined trial, but verbalized understanding of education provided. Would benefit from further reinforcemnt and trials.   Shoulder Instructions       General Comments Abdominal bandages, drain, and ostomoy in place at start/end of session.    Pertinent Vitals/ Pain       Pain Assessment: Faces Faces Pain Scale: Hurts little more Pain Location: abdomen Pain Descriptors / Indicators: Sore Pain Intervention(s): Limited activity within patient's tolerance;Monitored during session  Home Living                                          Prior Functioning/Environment              Frequency  Min 2X/week        Progress Toward Goals  OT Goals(current goals can now be found in the care plan section)  Progress towards OT goals: Progressing toward goals  Acute Rehab OT Goals Patient Stated Goal: return home, return to [place] Discharge plan remains appropriate;Frequency remains appropriate    Co-evaluation                 AM-PAC OT "6 Clicks" Daily Activity     Outcome Measure   Help from another person eating meals?: A Little Help from another person taking care of personal grooming?: A Little Help from another person toileting, which includes using toliet, bedpan, or urinal?: A Lot Help from another person bathing (including washing, rinsing, drying)?: A Lot Help from another person to put on and taking off regular upper body clothing?: A Little Help from another person to put on and taking off regular lower body clothing?: A Lot 6 Click Score: 15    End of Session Equipment Utilized During Treatment: (LH reacher, sock aid, RW)  OT Visit Diagnosis: Muscle weakness (generalized) (M62.81)   Activity Tolerance Patient limited by fatigue   Patient Left in bed;with call bell/phone within reach;with bed alarm  set;with SCD's reapplied   Nurse Communication          Time: 6734-1937 OT Time Calculation (min): 15 min  Charges: OT General Charges $OT Visit: 1 Visit OT Treatments $Self Care/Home Management : 8-22 mins  Shara Blazing, M.S., OTR/L Ascom: [phone] 03/20/19, 3:22 PM

## 2019-03-20 NOTE — Care Management Important Message (Signed)
Important Message  Patient Details  Name: Tyrone Luna MRN: [id number] Date of Birth: 1945/12/26   Medicare Important Message Given:  Yes     Juliann Pulse A Jaren Kearn 03/20/2019, 11:42 AM

## 2019-03-20 NOTE — Consult Note (Signed)
[place] Nurse ostomy follow up Patient receiving care in [facility] Stoma type/location: RLQ colostomy Stomal assessment/size: same size and characteristics as of 7/22. Peristomal assessment: intact Treatment options for stomal/peristomal skin: barrier ring Output:  Scant serosanginous, passing flatus.  Able to "burp" pouch.  Ostomy pouching: 2pc. Flat, cut to fit, with barrier ring  Education provided: Patient able to open/close/burp pouch.  Observed complete removal and application process.  All questions answered to his expressed satisfaction. Enrolled patient in [place] Start Discharge program: Yes on 03/18/19.  Additional supplies and pattern at bedside. Val Riles, RN, MSN, CWOCN, CNS-BC, pager [phone]

## 2019-03-20 NOTE — Progress Notes (Signed)
Physical Therapy Treatment Patient Details Name: SANAY BELMAR MRN: [id number] DOB: September 10, 1945 Today's Date: 03/20/2019    History of Present Illness Pt. is a 73 y.o. male who presented to hospital ED per reccomendation of PCP for abdominal pain and inability to have BM. He was admitted with acute sigmoid diverticulitis. On 03/15/2019 he underwent laparotomy, drainage of intra-abdominal abscess, Hartmann's procedure and takedown of splenic flexure for perforated diverticulitis w peritonitis. PMH includes Arthritis, BPH associated with nocturia, erectile dysfunction, Hyperlipemia, Hypertension, Male hypogonadism, Sleep apnea, Urinary frequency, and Urinary urgency, hx of R knee sx, L inguinal hernia repair, and bilateral eye sx.    PT Comments    Pt in bed, ready to work.  To edge of bed with min a x 1.  Stood with min a x 1 with raised bed.  He was able to ambulate slowly around unit with heavy reliance on walker with min guard/assist.  Overall progressing well with increased activity tolerance.  Pt pleased with progress and remained in recliner after session.    Follow Up Recommendations  Home health PT     Equipment Recommendations  Rolling walker with 5" wheels    Recommendations for Other Services OT consult     Precautions / Restrictions Precautions Precautions: Other (comment) Precaution Comments: NG tube, Ostomy bag, drains, abdominal incision. Restrictions Weight Bearing Restrictions: No    Mobility  Bed Mobility Overal bed mobility: Needs Assistance Bed Mobility: Supine to Sit     Supine to sit: Min assist        Transfers Overall transfer level: Needs assistance Equipment used: Rolling walker (2 wheeled) Transfers: Sit to/from Stand Sit to Stand: Min assist;From elevated surface            Ambulation/Gait Ambulation/Gait assistance: Min guard;Min assist Gait Distance (Feet): 160 Feet Assistive device: Rolling walker (2 wheeled) Gait  Pattern/deviations: Step-through pattern Gait velocity: reduced   General Gait Details: Generally steady - fatigued with effort but no buckling or LOB   Stairs             Wheelchair Mobility    Modified Rankin (Stroke Patients Only)       Balance Overall balance assessment: Modified Independent Sitting-balance support: Feet supported Sitting balance-Leahy Scale: Good     Standing balance support: Bilateral upper extremity supported Standing balance-Leahy Scale: Fair Standing balance comment: Patient dependent on RW for support while standing and ambulating.                            Cognition Arousal/Alertness: Awake/alert Behavior During Therapy: WFL for tasks assessed/performed Overall Cognitive Status: Within Functional Limits for tasks assessed                                        Exercises      General Comments        Pertinent Vitals/Pain Pain Assessment: Faces Faces Pain Scale: Hurts a little bit Pain Descriptors / Indicators: Sore Pain Intervention(s): Limited activity within patient's tolerance    Home Living                      Prior Function            PT Goals (current goals can now be found in the care plan section) Progress towards PT goals: Progressing toward goals  Frequency    Min 3X/week      PT Plan Current plan remains appropriate    Co-evaluation              AM-PAC PT "6 Clicks" Mobility   Outcome Measure  Help needed turning from your back to your side while in a flat bed without using bedrails?: A Little Help needed moving from lying on your back to sitting on the side of a flat bed without using bedrails?: A Little Help needed moving to and from a bed to a chair (including a wheelchair)?: A Little Help needed standing up from a chair using your arms (e.g., wheelchair or bedside chair)?: A Little Help needed to walk in hospital room?: A Little Help needed climbing  3-5 steps with a railing? : A Lot 6 Click Score: 17    End of Session Equipment Utilized During Treatment: Gait belt Activity Tolerance: Patient limited by fatigue;Patient tolerated treatment well Patient left: in chair;with call bell/phone within reach;with chair alarm set         Time: 8882-8003 PT Time Calculation (min) (ACUTE ONLY): 17 min  Charges:  $Gait Training: 8-22 mins                     Chesley Noon, PTA 03/20/19, 12:47 PM

## 2019-03-20 NOTE — Progress Notes (Signed)
S/p hartmann's AVSS Tolerated clamping trial On clears Ostomy w gas  PE NAD Abd: soft, open wound good granulation tissue. JP serous  A/P Doing well Continue a/bs Mobilize TPN for 1 more day Advance diet DC JP DC Sunday or Monday, ? SNIF

## 2019-03-20 NOTE — Consult Note (Signed)
[facility] NOTE   Pharmacy Consult for TPN  Indication: prolonged ileus  Patient Measurements: Height: 6\' 1"  (185.4 cm) Weight: 244 lb 4.3 oz (110.8 kg) IBW/kg (Calculated) : 79.9 TPN AdjBW (KG): 90 Body mass index is 32.23 kg/m. Usual Weight: 116.1 kg  Assessment:  Pharmacy consulted for initiation and monitoring of TPN and electrolytes for 73yo patient post Hartmann's Procedure.   GI: LBM 7/22 Endo: SSI 0-9 units Q4 hours Insulin requirements in the past 24 hours: 6u Lytes: K 3.4(replaced with KCl 79mEq IV x 1 dose), Mag 2.4, Phos 2.5(replaced with 1g KPhos PO x 2 doses) Renal: Scr 0.87 Pulm:  Cards: amlodipine 10mg  daily, Triamterene-HCTZ 37.5/25mg  Hepatobil: Neuro: ID: Zosyn 7/16>>  TPN Access: PICC right brachial TPN start date: 7/20 Nutritional Goals  After 24 hours if electrolytes, CBGs, and triglycerides are in acceptable range advance to goal regimen of Clinimix 5/20 (assess daily if providing electrolytes or no electrolytes) at 83 mL/hr x 24 hours + 20% ILE at 20 mL/hr x 12 hours.   Goal regimen provides 2233 kcal (97% estimated needs), 100 grams of protein (87% estimated needs), 1992 mL from Clinimix and 240 mL from lipids daily. Unable to meet full calorie/protein needs with Clinimix.  Provide adult MVI daily in TPN and trace elements M/W/F in TPN due to shortage.  Plan is for total fluid volume of 100 mL/hr including TPN.  Measure daily weights.  Goal TPN rate is 83 ml/hr  Current Nutrition:   Plan:  7/24 Continue TPN(w/ electrolytes) at goal 42mL/hr x 24 hours. Lipids at 57ml/hr for 12 hours Provides 2233 kcal (97% estimated needs), 100 grams of protein (87% estimated needs), 1992 mL from Clinimix and 240 mL from lipids daily. Unable to meet full calorie/protein needs with Clinimix. Electrolytes in TPN: none currently Added Thiamine 100mg  x 3 days(last day is today 7/24) Add MVI daily, trace elements every  MWF(next due 7/27) No current insulin added to TPN bag. 7/24 IVMF (D5, NS, etc.) at 17 ml/hr Monitor TPN labs, electrolytes(Phos,Mag and K) daily x first 3 days. Triglycerides F/U daily  Pearla Dubonnet, PharmD Clinical Pharmacist 03/20/2019 1:51 PM

## 2019-03-20 NOTE — Progress Notes (Signed)
OT Cancellation Note  Patient Details Name: Tyrone Luna MRN: [id number] DOB: September 29, 1945   Cancelled Treatment:    Reason Eval/Treat Not Completed: Patient at procedure or test/ unavailable. Ot attempted to see this pt for tx on this date. Upon arrival to room, pt with RN completing ostomy training. Per RN pt will be unavailable for at least an hour. Will re-attempt at a later time/date as available and pt medically appropriate for OT Tx.   Shara Blazing, M.S., OTR/L Ascom: [phone] 03/20/19, 10:39 AM

## 2019-03-21 LAB — GLUCOSE, CAPILLARY
Glucose-Capillary: 186 mg/dL — ABNORMAL HIGH (ref 70–99)
Glucose-Capillary: 176 mg/dL — ABNORMAL HIGH (ref 70–99)
Glucose-Capillary: 140 mg/dL — ABNORMAL HIGH (ref 70–99)
Glucose-Capillary: 151 mg/dL — ABNORMAL HIGH (ref 70–99)
Glucose-Capillary: 136 mg/dL — ABNORMAL HIGH (ref 70–99)

## 2019-03-21 LAB — PHOSPHORUS: Phosphorus: 2.6 mg/dL (ref 2.5–4.6)

## 2019-03-21 LAB — MAGNESIUM: Magnesium: 2 mg/dL (ref 1.7–2.4)

## 2019-03-21 LAB — POTASSIUM: Potassium: 3.8 mmol/L (ref 3.5–5.1)

## 2019-03-21 MED ORDER — FAT EMULSION PLANT BASED 20 % IV EMUL
120.0000 mL | INTRAVENOUS | Status: DC
  Administered 2019-03-21: 120 mL via INTRAVENOUS
  Filled 2019-03-21: qty 120

## 2019-03-21 MED ORDER — THIAMINE HCL 100 MG/ML IJ SOLN
INTRAVENOUS | Status: DC
  Filled 2019-03-21: qty 960

## 2019-03-21 MED ORDER — M.V.I. ADULT IV INJ
INTRAVENOUS | Status: DC
  Administered 2019-03-21: 19:00:00 via INTRAVENOUS
  Filled 2019-03-21: qty 960

## 2019-03-21 NOTE — Progress Notes (Signed)
Physical Therapy Treatment Patient Details Name: Tyrone Luna MRN: [id number] DOB: [birth year] Today's Date: 03/21/2019    History of Present Illness Pt. is a 73 y.o. male who presented to hospital ED per reccomendation of PCP for abdominal pain and inability to have BM. He was admitted with acute sigmoid diverticulitis. On 03/15/2019 he underwent laparotomy, drainage of intra-abdominal abscess, Hartmann's procedure and takedown of splenic flexure for perforated diverticulitis w peritonitis. PMH includes Arthritis, BPH associated with nocturia, erectile dysfunction, Hyperlipemia, Hypertension, Male hypogonadism, Sleep apnea, Urinary frequency, and Urinary urgency, hx of R knee sx, L inguinal hernia repair, and bilateral eye sx.    PT Comments    Pt in bed upon entry, agreeable to participate. Session focussed on strengthening and technique with tranfers. Pt able to perform 3 sets of 3 from elevated EOB, but requires considerable BUE assist on RW to achieve this and ultimately is very fatigued after each set of 3, unable to get to 5 as planned. Pt responds in a meaningful way to cues and education given for technique. Pain is stable throughout. No physical assist needed to get up from supine, but this is also labored and difficult. Pt refuses up to chair as end of session, reports it hurts his butt and it takes too long for staff to assist him back to chair when he calls. Pt agreeable to sit at EOB at end of session and eat lunch there. RN made aware.    Follow Up Recommendations  Home health PT     Equipment Recommendations  Rolling walker with 5" wheels    Recommendations for Other Services OT consult     Precautions / Restrictions Precautions Precautions: Other (comment);Fall Precaution Comments: Ostomy bag, abdominal incision. Restrictions Weight Bearing Restrictions: No    Mobility  Bed Mobility Overal bed mobility: Needs Assistance Bed Mobility: Supine to Sit     Supine  to sit: Supervision        Transfers Overall transfer level: Needs assistance Equipment used: Rolling walker (2 wheeled) Transfers: Sit to/from Stand Sit to Stand: From elevated surface;Supervision         General transfer comment: 3 sets of 3, RW, moderate effort, heavy BUE assist.  Ambulation/Gait Ambulation/Gait assistance: (deferred at this time.)               Stairs             Wheelchair Mobility    Modified Rankin (Stroke Patients Only)       Balance Overall balance assessment: Modified Independent;No apparent balance deficits (not formally assessed)                                          Cognition Arousal/Alertness: Awake/alert Behavior During Therapy: WFL for tasks assessed/performed Overall Cognitive Status: Within Functional Limits for tasks assessed                                 General Comments: Patient A & O x 4.      Exercises Other Exercises Other Exercises: STS from elevated EOB with RW: 3x3 with rest between; pt requires significant additional time 2/2 fatigue    General Comments        Pertinent Vitals/Pain Pain Assessment: 0-10 Pain Score: 2  Pain Location: ABD Pain Descriptors / Indicators: Aching Pain Intervention(s): Limited  activity within patient's tolerance;Monitored during session    Home Living                      Prior Function            PT Goals (current goals can now be found in the care plan section) Acute Rehab PT Goals Patient Stated Goal: return home, return to PLOF PT Goal Formulation: With patient Time For Goal Achievement: 03/30/19 Potential to Achieve Goals: Good Progress towards PT goals: Progressing toward goals    Frequency    Min 2X/week      PT Plan Current plan remains appropriate    Co-evaluation              AM-PAC PT "6 Clicks" Mobility   Outcome Measure  Help needed turning from your back to your side while in a flat  bed without using bedrails?: A Little Help needed moving from lying on your back to sitting on the side of a flat bed without using bedrails?: A Little Help needed moving to and from a bed to a chair (including a wheelchair)?: A Little Help needed standing up from a chair using your arms (e.g., wheelchair or bedside chair)?: A Little Help needed to walk in hospital room?: A Little Help needed climbing 3-5 steps with a railing? : A Lot 6 Click Score: 17    End of Session   Activity Tolerance: Patient limited by fatigue;Patient tolerated treatment well Patient left: with call bell/phone within reach;in bed(seated EOB tray table in place) Nurse Communication: Mobility status;Other (comment) PT Visit Diagnosis: Muscle weakness (generalized) (M62.81);Unsteadiness on feet (R26.81);Difficulty in walking, not elsewhere classified (R26.2)     Time: 6712-4580 PT Time Calculation (min) (ACUTE ONLY): 21 min  Charges:  $Therapeutic Exercise: 8-22 mins                     5:22 PM, 03/21/19 Etta Grandchild, PT, DPT Physical Therapist - [facility]  [phone] ([place])    Kateri Balch C 03/21/2019, 5:19 PM

## 2019-03-21 NOTE — Progress Notes (Signed)
Nutrition Brief Note  RD paged by RN regarding TPN. Per Surgery note today, "wean TPN." Diet advanced from full liquid to Soft this AM.  PO intake 7/24: 100% x all 3 meals  Discussed plan with Pharmacy and have asked Pharmacy to reduce tonight's TPN rate back to 40 ml/hr and continue 20% ILE at 10 mL/hr x 12 hours.  TPN: Clinimix E 5/20 at 40 mL/hr x 24 hrs + 20% ILE at 20 mL/hr x 12 hrs + adult MVI 10 mL daily + trace elements 1 mL M/W/F due to shortage  Last day of thiamine 100 mg x 3 days was 7/24.   Gaynell Face, MS, RD, LDN Inpatient Clinical Dietitian Pager: [phone] Weekend/After Hours: [phone]

## 2019-03-21 NOTE — Consult Note (Signed)
[place] NOTE   Pharmacy Consult for TPN  Indication: prolonged ileus  Patient Measurements: Height: 6\' 1"  (185.4 cm) Weight: 246 lb 14.4 oz (112 kg) IBW/kg (Calculated) : 79.9 TPN AdjBW (KG): 90 Body mass index is 32.57 kg/m. Usual Weight: 116.1 kg  Assessment:  Pharmacy consulted for initiation and monitoring of TPN and electrolytes for 73yo patient post Hartmann's Procedure.   GI: LBM 7/22 Endo: SSI 0-9 units Q4 hours Insulin requirements in the past 24 hours: 6u Lytes: WNL Renal: Scr 0.87 Pulm:  Cards: amlodipine 10mg  daily, Triamterene-HCTZ 37.5/25mg  Hepatobil: Neuro: ID: Zosyn 7/16>>  TPN Access: PICC right brachial TPN start date: 7/20 Nutritional Goals  After 24 hours if electrolytes, CBGs, and triglycerides are in acceptable range advance to goal regimen of Clinimix 5/20 (assess daily if providing electrolytes or no electrolytes) at 83 mL/hr x 24 hours + 20% ILE at 20 mL/hr x 12 hours.   Goal regimen provides 2233 kcal (97% estimated needs), 100 grams of protein (87% estimated needs), 1992 mL from Clinimix and 240 mL from lipids daily. Unable to meet full calorie/protein needs with Clinimix.  Provide adult MVI daily in TPN and trace elements M/W/F in TPN due to shortage.  Plan is for total fluid volume of 100 mL/hr including TPN.  Measure daily weights.  Goal TPN rate is 83 ml/hr  Current Nutrition:   Plan:  7/25 Per Surgery note today, "wean TPN." Diet advanced from full liquid to Soft this AM.  Discussed plan with RD to reduce tonight's TPN rate back to 40 ml/hr and continue 20% ILE at 10 mL/hr x 12 hours.   Olivia Canter [facility] Clinical Pharmacist 03/21/2019 12:46 PM

## 2019-03-21 NOTE — Progress Notes (Signed)
Subjective:  CC: Tyrone Luna is a 73 y.o. male  Hospital stay day 9, 6 Days Post-Op Hartmann's  HPI: No issues overnight.  Tolerated clears, some gas in bag, but no BM.  ROS:  General: Denies weight loss, weight gain, fatigue, fevers, chills, and night sweats. Heart: Denies chest pain, palpitations, racing heart, irregular heartbeat, leg pain or swelling, and decreased activity tolerance. Respiratory: Denies breathing difficulty, shortness of breath, wheezing, cough, and sputum. GI: Denies change in appetite, heartburn, nausea, vomiting, constipation, diarrhea, and blood in stool. GU: Denies difficulty urinating, pain with urinating, urgency, frequency, blood in urine.   Objective:   Temp:  [98.8 F (37.1 C)-99.7 F (37.6 C)] 98.9 F (37.2 C) (07/25 0355) Pulse Rate:  [62-66] 64 (07/25 0355) Resp:  [16-18] 16 (07/25 0355) BP: (144-159)/(62-65) 144/65 (07/25 0355) SpO2:  [95 %-97 %] 97 % (07/25 0355) Weight:  [160 kg] 112 kg (07/25 0355)     Height: 6\' 1"  (185.4 cm) Weight: 112 kg BMI (Calculated): 32.58   Intake/Output this shift:   Intake/Output Summary (Last 24 hours) at 03/21/2019 1154 Last data filed at 03/21/2019 0931 Gross per 24 hour  Intake 3107.68 ml  Output 3080 ml  Net 27.68 ml    Constitutional :  alert, cooperative, appears stated age and no distress  Respiratory:  clear to auscultation bilaterally  Cardiovascular:  regular rate and rhythm  Gastrointestinal: soft, no guarding, slight distention?  ostomy with minimal gas, swollen ostomy site but patent.   Skin: Cool and moist. Midline incision open wound clean, healthy fat and some granulation tissue at base.  Non-tender  Psychiatric: Normal affect, non-agitated, not confused       LABS:  CMP Latest Ref Rng & Units 03/21/2019 03/20/2019 03/19/2019  Glucose 70 - 99 mg/dL - 154(H) 134(H)  BUN 8 - 23 mg/dL - 18 20  Creatinine 0.61 - 1.24 mg/dL - 0.87 0.84  Sodium 135 - 145 mmol/L - 135 136  Potassium 3.5  - 5.1 mmol/L 3.8 3.4(L) 3.3(L)  Chloride 98 - 111 mmol/L - 106 107  CO2 22 - 32 mmol/L - 20(L) 21(L)  Calcium 8.9 - 10.3 mg/dL - 8.0(L) 7.8(L)  Total Protein 6.5 - 8.1 g/dL - - 5.3(L)  Total Bilirubin 0.3 - 1.2 mg/dL - - 2.6(H)  Alkaline Phos 38 - 126 U/L - - 65  AST 15 - 41 U/L - - 41  ALT 0 - 44 U/L - - 44   CBC Latest Ref Rng & Units 03/19/2019 03/18/2019 03/17/2019  WBC 4.0 - 10.5 K/uL 7.0 7.9 11.3(H)  Hemoglobin 13.0 - 17.0 g/dL 13.3 12.7(L) 13.7  Hematocrit 39.0 - 52.0 % 39.5 39.0 41.0  Platelets 150 - 400 K/uL 113(L) 101(L) 88(L)    RADS: n/a Assessment:   S/p Hartmann's, slowly recovering.  Will advance to soft today to see if he tolerates.  Wean TPN.  I changed the midline wet-to-dry dressing by myself.  Continue with BID dressing and PRN.  Plan for d/c with home health in place, but OT has recommended SNF on last note.  Will f/u with case management

## 2019-03-22 LAB — GLUCOSE, CAPILLARY
Glucose-Capillary: 116 mg/dL — ABNORMAL HIGH (ref 70–99)
Glucose-Capillary: 119 mg/dL — ABNORMAL HIGH (ref 70–99)
Glucose-Capillary: 130 mg/dL — ABNORMAL HIGH (ref 70–99)
Glucose-Capillary: 141 mg/dL — ABNORMAL HIGH (ref 70–99)
Glucose-Capillary: 141 mg/dL — ABNORMAL HIGH (ref 70–99)
Glucose-Capillary: 147 mg/dL — ABNORMAL HIGH (ref 70–99)

## 2019-03-22 LAB — PHOSPHORUS: Phosphorus: 2.9 mg/dL (ref 2.5–4.6)

## 2019-03-22 LAB — MAGNESIUM: Magnesium: 2 mg/dL (ref 1.7–2.4)

## 2019-03-22 NOTE — Progress Notes (Signed)
[place] Hospital Day(s): 10.   Post op day(s): 7 Days Post-Op.   Interval History: Patient seen and examined, no acute events or new complaints overnight. Patient reports to be better today.  He reported he continue passing gas and stool through the ostomy.  Denies nausea or vomiting.  Denies fever or chills.  Still feeling relatively weak.  Vital signs in last 24 hours: [min-max] current  Temp:  [98.6 F (37 C)-99.1 F (37.3 C)] 99 F (37.2 C) (07/26 0602) Pulse Rate:  [62-69] 62 (07/26 0602) Resp:  [16-18] 16 (07/26 0602) BP: (145-151)/(64-72) 151/72 (07/26 0602) SpO2:  [96 %] 96 % (07/26 0602) Weight:  [113.6 kg] 113.6 kg (07/26 0602)     Height: 6\' 1"  (185.4 cm) Weight: 113.6 kg BMI (Calculated): 33.05    Physical Exam:  Constitutional: alert, cooperative and no distress  Respiratory: breathing non-labored at rest  Cardiovascular: regular rate and sinus rhythm  Gastrointestinal: soft, non-tender, and non-distended. Ostomy pink and patent.   Labs:  CBC Latest Ref Rng & Units 03/19/2019 03/18/2019 03/17/2019  WBC 4.0 - 10.5 K/uL 7.0 7.9 11.3(H)  Hemoglobin 13.0 - 17.0 g/dL 13.3 12.7(L) 13.7  Hematocrit 39.0 - 52.0 % 39.5 39.0 41.0  Platelets 150 - 400 K/uL 113(L) 101(L) 88(L)   CMP Latest Ref Rng & Units 03/21/2019 03/20/2019 03/19/2019  Glucose 70 - 99 mg/dL - 154(H) 134(H)  BUN 8 - 23 mg/dL - 18 20  Creatinine 0.61 - 1.24 mg/dL - 0.87 0.84  Sodium 135 - 145 mmol/L - 135 136  Potassium 3.5 - 5.1 mmol/L 3.8 3.4(L) 3.3(L)  Chloride 98 - 111 mmol/L - 106 107  CO2 22 - 32 mmol/L - 20(L) 21(L)  Calcium 8.9 - 10.3 mg/dL - 8.0(L) 7.8(L)  Total Protein 6.5 - 8.1 g/dL - - 5.3(L)  Total Bilirubin 0.3 - 1.2 mg/dL - - 2.6(H)  Alkaline Phos 38 - 126 U/L - - 65  AST 15 - 41 U/L - - 41  ALT 0 - 44 U/L - - 44    Imaging studies: No new pertinent imaging studies   Assessment/Plan:  Patient with acute diverticulitis with perforation s/p Hartmann's procedure.   Patient recovering slowly.  Today feeling a little bit better but still weak.  Tolerating soft diet.  Agreed to discontinue TPN as per protocol.  Pain under control.  There is no fever or tachycardia.  Pain is under control on the current medications.  Patient receiving physical therapy.  PT recommended skilled nursing facility.  Pending approval.  We will continue medical management in the hospital.  We will continue local care of the wound with wet-to-dry dressing twice daily.   Arnold Long, MD

## 2019-03-22 NOTE — Progress Notes (Signed)
During pt bathe, this nurse observe pt back down to bilateral thigh was red, and warm, seems like a rash. Pt denies pain, or itching. On call surgery was notified for follow up in am. Will continue to monitor.

## 2019-03-23 LAB — GLUCOSE, CAPILLARY
Glucose-Capillary: 116 mg/dL — ABNORMAL HIGH (ref 70–99)
Glucose-Capillary: 111 mg/dL — ABNORMAL HIGH (ref 70–99)

## 2019-03-23 LAB — AEROBIC/ANAEROBIC CULTURE W GRAM STAIN (SURGICAL/DEEP WOUND)

## 2019-03-23 MED ORDER — OXYCODONE HCL 5 MG PO TABS: 5.0000 mg | ORAL_TABLET | Freq: Four times a day (QID) | ORAL | 0 refills | Status: DC | PRN

## 2019-03-23 MED ORDER — ENSURE ENLIVE PO LIQD
237.0000 mL | Freq: Two times a day (BID) | ORAL | Status: DC
  Administered 2019-03-23 (×2): 237 mL via ORAL

## 2019-03-23 NOTE — TOC Transition Note (Signed)
Transition of Care [facility]) - CM/SW Discharge Note   Patient Details  Name: ROLLY MAGRI MRN: [id number] Date of Birth: 30-Dec-1945  [facility] of [facility]) CM/SW Contact:  Shelbie Hutching, RN Phone Number: 03/23/2019, 12:26 PM   Clinical Narrative:    Discharge home with home health services planned for today.  Wound and Ostomy RN will provide one last teaching session with patient today before discharge.  Patient will discharge home with wife.  [facility] notified of discharge today.  3 in 1 brought to patient's room for him to take home.    Final next level of care: Home w Home Health Services Barriers to Discharge: Barriers Resolved   Patient Goals and CMS Choice   CMS Medicare.gov Compare Post Acute Care list provided to:: Patient Choice offered to / list presented to : Patient  Discharge Placement                       Discharge Plan and Services     Post Acute Care Choice: Home Health          DME Arranged: 3-N-1 DME Agency: [employer] Date DME Agency Contacted: 03/23/19 Time DME Agency Contacted: 1211 Representative spoke with at DME Agency: [place]: RN, PT, OT [facility] Agency: [place] ([place]) Date [place]: 03/23/19 Time [place]: 1222 Representative spoke with at [place]: [place] (SDOH) Interventions     Readmission Risk Interventions No flowsheet data found.

## 2019-03-23 NOTE — Progress Notes (Signed)
Nutrition Follow-up  DOCUMENTATION CODES:   Obesity unspecified  INTERVENTION:  Provide Ensure Enlive po BID, each supplement provides 350 kcal and 20 grams of protein.  Reviewed "Colostomy Nutrition Therapy" handout from the Academy of Nutrition and Dietetics with patient. Encouraged intake of low-fiber foods while healing. Discussed diet can return to a more normal diet usually within about 6 weeks following surgery, but patient can discuss with surgeon during outpatient follow-up. Encouraged adequate hydration.  NUTRITION DIAGNOSIS:   Inadequate oral intake related to inability to eat as evidenced by NPO status.  Resolving - diet advanced and patient tolerating soft diet.  GOAL:   Patient will meet greater than or equal to 90% of their needs  Progressing.  MONITOR:   Diet advancement, Labs, Weight trends, Skin, I & O's  REASON FOR ASSESSMENT:   Consult New TPN/TNA  ASSESSMENT:   73 year old male with PMHx of arthritis, sleep apnea, HTN, BPH admitted with perforated diverticulitis with peritonitis s/p laparotomy, drainage of intra-abdominal abscess, Hartmann's procedure, and takedown of splenic flexure on 7/19.   -Diet was advanced to clear liquids on 7/23, full liquids on 7/24, and soft on 7/25. -TPN was weaned to 1/2 rate on 7/25 and then discontinued yesterday.  Patient reports he is tolerating soft diet well. He denies any N/V or post-prandial abdominal pain. Continues to have flatus and stool output from colostomy. Patient reports appetite is not back to baseline. He has been able to eat almost all of his breakfast meals but reports he is only eating about 50% of lunch and dinner meals. Patient is amenable to drinking Ensure to help meet calorie/protein needs. Discussed with Surgery PA through secure chat and okay to order Ensure.  Medications reviewed and include: pantoprazole, Zosyn.  Labs reviewed: CBG 116-147.   Diet Order:   Diet Order            DIET SOFT  Room service appropriate? Yes; Fluid consistency: Thin  Diet effective now             EDUCATION NEEDS:   Education needs have been addressed   Skin:  Skin Assessment: Skin Integrity Issues:(closed incision to abdomen)  Last BM:  03/23/2019 - small type 6  Height:   Ht Readings from Last 1 Encounters:  03/12/19 6\' 1"  (1.854 m)   Weight:   Wt Readings from Last 1 Encounters:  03/23/19 110.6 kg   Ideal Body Weight:  83.6 kg  BMI:  Body mass index is 32.17 kg/m.  Estimated Nutritional Needs:   Kcal:  8099-8338  Protein:  115-125 grams  Fluid:  2-2.5 L/day  Tyrone Blade, MS, RD, LDN Office: [phone] Pager: [phone] After Hours/Weekend Pager: [phone]

## 2019-03-23 NOTE — Progress Notes (Signed)
Physical Therapy Treatment Patient Details Name: SHEROD CISSE MRN: [id number] DOB: 01-13-1946 Today's Date: 03/23/2019    History of Present Illness Pt. is a 73 y.o. male who presented to hospital ED per reccomendation of PCP for abdominal pain and inability to have BM. He was admitted with acute sigmoid diverticulitis. On 03/15/2019 he underwent laparotomy, drainage of intra-abdominal abscess, Hartmann's procedure and takedown of splenic flexure for perforated diverticulitis w peritonitis. PMH includes Arthritis, BPH associated with nocturia, erectile dysfunction, Hyperlipemia, Hypertension, Male hypogonadism, Sleep apnea, Urinary frequency, and Urinary urgency, hx of R knee sx, L inguinal hernia repair, and bilateral eye sx.    PT Comments    Pt agreeable to PT; denies pain, but notes weakness. Pt agreeable to performing steps, as pt has 3 STE at home. Pt prefers transport via w/c to steps, as they are a distance from his room to conserve energy for discharge home today. Pt requires Min A for bed mobility and Min guard to Min A for STS transfers depending on height of surface. Pt educated on proper, safe technique for stair climbing and does so safely with Min guard ascending and Min A descending. Pt with current discharge home with HHPT to continue progressing endurance, strength and all functional mobility.    Follow Up Recommendations  Home health PT     Equipment Recommendations  Rolling walker with 5" wheels    Recommendations for Other Services       Precautions / Restrictions Precautions Precautions: Other (comment);Fall Precaution Comments: Ostomy bag, abdominal incision. Restrictions Weight Bearing Restrictions: No    Mobility  Bed Mobility Overal bed mobility: Needs Assistance Bed Mobility: Supine to Sit;Sit to Supine     Supine to sit: Min assist Sit to supine: Min assist   General bed mobility comments: for trunk elevation to sit and LEs to supine. Use of  bedrails to mobilize upward in bed in supine  Transfers Overall transfer level: Needs assistance Equipment used: Rolling walker (2 wheeled) Transfers: Sit to/from Stand Sit to Stand: Min guard;From elevated surface(Min A for lower surface)         General transfer comment: Takes increased time, cues for proper use of hands initially for 1 hand on walker and one on seated surface with best success.   Ambulation/Gait Ambulation/Gait assistance: Min guard Gait Distance (Feet): (bed to/from w/c and w/c to/from stairs for stair training) Assistive device: Rolling walker (2 wheeled)           Stairs Stairs: Yes Stairs assistance: Min assist;Min guard Stair Management: Step to pattern;Two rails Number of Stairs: 4 General stair comments: cues for step to technique and education regarding stonger leg to lead (pt feels is L) ascending and descending with R. Min A for descent for eccentric control/stability.    Wheelchair Mobility    Modified Rankin (Stroke Patients Only)       Balance Overall balance assessment: Needs assistance Sitting-balance support: Feet supported;Bilateral upper extremity supported Sitting balance-Leahy Scale: Good     Standing balance support: Bilateral upper extremity supported Standing balance-Leahy Scale: Fair                              Cognition Arousal/Alertness: Awake/alert Behavior During Therapy: WFL for tasks assessed/performed Overall Cognitive Status: Within Functional Limits for tasks assessed  Exercises Other Exercises Other Exercises: Pt educated in home/routines modifications including sleeping in recliner vs bed, increasing surface height to improve comfort/safety w/ transfers, bathing techniques, dressing techniques, and AE/DME    General Comments General comments (skin integrity, edema, etc.): LUE noted to have minor swelling, pt noting she just took the IV  out. Educated pt on positioning to decrease swelling, positioned on pillows      Pertinent Vitals/Pain Pain Assessment: No/denies pain    Home Living                      Prior Function            PT Goals (current goals can now be found in the care plan section) Acute Rehab PT Goals Patient Stated Goal: return home, return to PLOF Progress towards PT goals: Progressing toward goals    Frequency    Min 2X/week      PT Plan Current plan remains appropriate    Co-evaluation              AM-PAC PT "6 Clicks" Mobility   Outcome Measure  Help needed turning from your back to your side while in a flat bed without using bedrails?: A Lot Help needed moving from lying on your back to sitting on the side of a flat bed without using bedrails?: A Lot Help needed moving to and from a bed to a chair (including a wheelchair)?: A Little Help needed standing up from a chair using your arms (e.g., wheelchair or bedside chair)?: A Little Help needed to walk in hospital room?: A Little Help needed climbing 3-5 steps with a railing? : A Little 6 Click Score: 16    End of Session Equipment Utilized During Treatment: Gait belt Activity Tolerance: Patient tolerated treatment well;Patient limited by fatigue Patient left: in bed;with call bell/phone within reach;with bed alarm set   PT Visit Diagnosis: Muscle weakness (generalized) (M62.81);Unsteadiness on feet (R26.81);Difficulty in walking, not elsewhere classified (R26.2)     Time: 8333-8329 PT Time Calculation (min) (ACUTE ONLY): 36 min  Charges:  $Gait Training: 8-22 mins $Therapeutic Activity: 8-22 mins                      Larae Grooms, PTA 03/23/2019, 2:07 PM

## 2019-03-23 NOTE — Progress Notes (Signed)
   03/23/19 0900  Clinical Encounter Type  Visited With Patient  Visit Type Initial  Spiritual Encounters  Spiritual Needs Emotional  Stress Factors  Patient Stress Factors Health changes  Ch was rounding. Pt said he was feeling better today and explained how challenging the last week has been with physical pain, surgery, and recovery. Pt said he was expecting to go home today/tomorrow and felt ready for it. Pt has a goal of getting back to normal and ch encouraged the pt to focus on that goal. No follow up needed at this time.

## 2019-03-23 NOTE — Discharge Summary (Signed)
[facility] SURGICAL DISCHARGE SUMMARY  Patient ID: Tyrone Luna MRN: [id number] DOB/AGE: 72-Aug-1947 72 y.o.  Admit date: 03/12/2019 Discharge date: 03/23/2019  Discharge Diagnoses Patient Active Problem List   Diagnosis Date Noted  . Diverticulitis large intestine 03/12/2019  . Morbid obesity due to excess calories ([place]) 05/18/2015  . BPH with obstruction/lower urinary tract symptoms 05/05/2015  . Nocturia 05/05/2015  . Erectile dysfunction of organic origin 05/05/2015  . ED (erectile dysfunction) of organic origin 03/22/2015  . Allergy to environmental factors 03/22/2015  . Hyperlipidemia, unspecified 03/22/2015  . BP (high blood pressure) 03/22/2015  . 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 Apnea, sleep 03/22/2015    Consultants None  Procedures 03/15/2019:  1. Laparotomy  2. Drainage of intra-abdominal abscess  3. Hartmann's procedure  4. Takedown splenic flexure   HPI: Tyrone Luna is a 73 y.o. male who presented to [facility] ED on 07/16 for evaluation of abdominal pain. He noted lower abdominal pain described as a crampy sensation and moderate intensity. He had associated fevers with this pain. No other acuite issues. Work up in the ED was concerning for acute diverticulitis with small amount of intra-abdominal air. He was admitted to the medicine service and conservative management was initiated.   Hospital Course: Over the course of his first two hospital days he had no improvement in his symptoms and worsening of pain. On HD3 (07/19), he continued to fail conservative management and the decision for operative intervention was made. Informed consent was obtained and documented, and patient underwent uneventful hartman's procedure (Dr Dahlia Byes, 03/15/2019). Post-operatively, the patient had expected post-surgical ileus and TPN was placed on 07/20 (post-op day 1). He remained on TPN and NGT decompression until 07/23. NGT was removed on this date after tolerating  clamping trial. Diet was also initiated and TPN was weaned following this. He worked with physical and occupational therapy throughout his admission and ultimately recommend home health PT and OT. The remainder of patient's hospital course was essentially unremarkable, and discharge planning was initiated accordingly with patient safely able to be discharged home with appropriate discharge instructions, pain control, and outpatient follow-up after all of his and family's questions (wife via phone) were answered to their expressed satisfaction.   Discharge Condition: Good   Physical Examination:  Constitutional: Well appearing male, NAD Pulmonary: Normal effort, no respiratory distress Gastrointestinal: Soft, non-tender, non-distended, no rebound/guarding. 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  Skin: Midline laparotomy incision healing via secondary intention, no erythema, no drainage.    Allergies as of 03/23/2019   No Known Allergies     Medication List    TAKE these medications   amLODipine 10 MG tablet Commonly known as: NORVASC Take 10 mg by mouth daily.   aspirin 81 MG tablet Take 81 mg by mouth daily.   augmented betamethasone dipropionate 0.05 % cream Commonly known as: DIPROLENE-AF Apply topically.   cetirizine 10 MG tablet Commonly known as: ZYRTEC Take 10 mg by mouth daily.   dutasteride 0.5 MG capsule Commonly known as: AVODART Take 1 capsule (0.5 mg total) by mouth daily.   EpiPen 2-Pak 0.3 mg/0.3 mL Soaj injection Generic drug: EPINEPHrine   fluticasone 50 MCG/ACT nasal spray Commonly known as: FLONASE   GLUCOSAMINE 1500 COMPLEX PO Take by mouth.   loratadine-pseudoephedrine 10-240 MG 24 hr tablet Commonly known as: CLARITIN-D 24-hour Take 1 tablet by mouth daily.   omeprazole 40 MG capsule Commonly known as: PRILOSEC Take 40 mg by mouth daily.  oxyCODONE 5 MG immediate release tablet Commonly known as: Oxy  IR/ROXICODONE Take 1 tablet (5 mg total) by mouth every 6 (six) hours as needed for severe pain.   pilocarpine 5 MG tablet Commonly known as: SALAGEN Take 5 mg by mouth 2 (two) times daily.   PRESERVISION AREDS 2 PO Take by mouth.   sildenafil 20 MG tablet Commonly known as: REVATIO Take 3 to 5 tablets two hours before intercouse on an empty stomach.  Do not take with nitrates.   simvastatin 20 MG tablet Commonly known as: ZOCOR Take 20 mg by mouth daily at 6 PM.   tadalafil 20 MG tablet Commonly known as: CIALIS Take 1 tablet (20 mg total) by mouth daily as needed for erectile dysfunction.   triamcinolone cream 0.5 % Commonly known as: KENALOG APPLY TOPICALLY TWO TIMES DAILY FOR UP TO 7 TO 10 DAYS.   triamterene-hydrochlorothiazide 37.5-25 MG capsule Commonly known as: DYAZIDE Take 1 capsule by mouth daily.   Trospium Chloride 60 MG Cp24 Take 1 capsule (60 mg total) by mouth daily.            Durable Medical Equipment  (From admission, onward)         Start     Ordered   03/23/19 1109  For home use only DME Bedside commode  Once    Question:  Patient needs a bedside commode to treat with the following condition  Answer:  Physical deconditioning   03/23/19 1109           Discharge Care Instructions  (From admission, onward)         Start     Ordered   03/23/19 0000  Discharge wound care:    Comments: Wet to dry dressing changes to midline wound daily   03/23/19 1322           Follow-up Information    [place], [place] F, MD. Schedule an appointment as soon as possible for a visit in 1 week(s).   Specialty: General Surgery Why: s/p hartmans, midline wound with wet to dry dressing Contact information: [address] [place] [address] [phone]            Time spent on discharge management including discussion of hospital course, clinical condition, outpatient instructions, prescriptions, and follow up with the patient  and members of the medical team: >30 minutes  -- Edison Simon , PA-C [facility]  03/23/2019, 1:22 PM [phone] M-F: 7am - 4pm

## 2019-03-23 NOTE — Progress Notes (Signed)
Occupational Therapy Treatment Patient Details Name: Tyrone Luna MRN: [id number] DOB: [birth year] Today's Date: 03/23/2019    History of present illness Pt. is a 73 y.o. male who presented to hospital ED per reccomendation of PCP for abdominal pain and inability to have BM. He was admitted with acute sigmoid diverticulitis. On 03/15/2019 he underwent laparotomy, drainage of intra-abdominal abscess, Hartmann's procedure and takedown of splenic flexure for perforated diverticulitis w peritonitis. PMH includes Arthritis, BPH associated with nocturia, erectile dysfunction, Hyperlipemia, Hypertension, Male hypogonadism, Sleep apnea, Urinary frequency, and Urinary urgency, hx of R knee sx, L inguinal hernia repair, and bilateral eye sx.   OT comments  Pt seen for OT tx this date. Please agreeable to therapy, although politely declines OOB at this time 2/2 attempts to conserve strength for PT session. Pt expressed concern regarding getting in/out of bed at home and performing ADL tasks safely. Pt educated in home/routines modifications including sleeping in recliner vs bed, increasing surface height to improve comfort/safety w/ transfers, bathing techniques, dressing techniques, and AE/DME to improve safety/independence. Pt verbalized understanding. Politely declined to trial himself. Pt continues to require significant assist for LB ADL tasks in an effort to minimize bending at the waist which causes increased pain. Pt continues to benefit from skilled OT services. Recommendation updated to [facility] services based on pt's progress and RNCM notified.    Follow Up Recommendations  Home health OT    Equipment Recommendations  3 in 1 bedside commode;Other (comment);Tub/shower seat(reacher)    Recommendations for Other Services      Precautions / Restrictions Precautions Precautions: Other (comment);Fall Precaution Comments: Ostomy bag, abdominal incision. Restrictions Weight Bearing Restrictions: No        Mobility Bed Mobility               General bed mobility comments: pt declined 2/2 trying to conserve effort for PT and abdominal discomfort  Transfers                      Balance                                           ADL either performed or assessed with clinical judgement   ADL Overall ADL's : Needs assistance/impaired                                       General ADL Comments: Mod-Max A for LB ADL 2/2 abdominal pain with bending     Vision Patient Visual Report: No change from baseline     Perception     Praxis      Cognition Arousal/Alertness: Awake/alert Behavior During Therapy: WFL for tasks assessed/performed Overall Cognitive Status: Within Functional Limits for tasks assessed                                          Exercises Other Exercises Other Exercises: Pt educated in home/routines modifications including sleeping in recliner vs bed, increasing surface height to improve comfort/safety w/ transfers, bathing techniques, dressing techniques, and AE/DME   Shoulder Instructions       General Comments LUE noted to have minor swelling, pt noting she just took the IV  out. Educated pt on positioning to decrease swelling, positioned on pillows    Pertinent Vitals/ Pain       Pain Assessment: No/denies pain(pt reports minimal discomfort)  Home Living                                          Prior Functioning/Environment              Frequency  Min 2X/week        Progress Toward Goals  OT Goals(current goals can now be found in the care plan section)  Progress towards OT goals: Progressing toward goals  Acute Rehab OT Goals Patient Stated Goal: return home, return to [facility]  Plan Frequency remains appropriate;Discharge plan needs to be updated    Co-evaluation                 AM-PAC OT "6 Clicks" Daily Activity     Outcome Measure    Help from another person eating meals?: None Help from another person taking care of personal grooming?: A Little Help from another person toileting, which includes using toliet, bedpan, or urinal?: A Little Help from another person bathing (including washing, rinsing, drying)?: A Lot Help from another person to put on and taking off regular upper body clothing?: A Little Help from another person to put on and taking off regular lower body clothing?: A Lot 6 Click Score: 17    End of Session    OT Visit Diagnosis: Muscle weakness (generalized) (M62.81)   Activity Tolerance Patient tolerated treatment well   Patient Left in bed;with call bell/phone within reach;with bed alarm set   Nurse Communication Other (comment)(pt had question about when ostomy RN would return for additional instruction)        Time: 1638-4536 OT Time Calculation (min): 23 min  Charges: OT General Charges $OT Visit: 1 Visit OT Treatments $Self Care/Home Management : 23-37 mins  Jeni Salles, MPH, MS, OTR/L ascom [phone] 03/23/19, 11:06 AM

## 2019-03-23 NOTE — Care Management Important Message (Signed)
Important Message  Patient Details  Name: Tyrone Luna MRN: [id number] Date of Birth: 07-01-1946   Medicare Important Message Given:  Yes     Juliann Pulse A Duran Ohern 03/23/2019, 11:29 AM

## 2019-03-23 NOTE — Consult Note (Signed)
[facility] Nurse ostomy follow up Stoma type/location: Very low in the left lower quadrant.  Patient is unable to see stoma.  Stomal assessment/size: 1 and 1/2 inch oval with deeper red, edematous mucosa. Os at center.  Peristomal assessment: intact Treatment options for stomal/peristomal skin:  Output: pasty, brown stool and flatus Ostomy pouching: 2pc. 2 and 1/4 inch pouching system with skin barrier ring Education provided:  Demonstrated pouch change (cutting new skin barrier, measuring stoma, cleaning peristomal skin and stoma, use of barrier ring), but patient is unable to view application due to location on abdomen Education on emptying when 1/3 to 1/2 full and how to empty. Patient knows how to perform Lock and Roll closure, but with other's emptying pouch, the tail closure has become very soiled and is difficult to open. Emptying demonstrated, cleaning the tail closure using numerous pieces of toilet paper fashioned into wicks and wicks moistened. Cleaning in then simulated using a clean pouch, and he can see how easy it is to open pouch when it is clean. He is encouraged to take over emptying pouch and to clean the tail closure prior to resealing.  Discussed risk of peristomal hernia and patient is encouraged to discuss lifting restrictions with Dr. Dahlia Byes. Taught that lifting is typically restricted to no more than 15 pounds in the immediate post operative period and no more than 20# thereafter to prevent hernia formation in the parastomal plane.  Answered patient questions:  He is taught that the change frequency is typically twice weekly and that the [facility] can work with him and his wife to learn that since he is not able to view the stoma. He is taught to empty the pouch when it is 1/3 to 1/2 full and that typically this is after breakfast, after lunch, after dinner and always before bedtime. He is taught to empty pouch of air (flatus) as needed.  Patient's bedside RN states that he may be going  home today or tomorrow.  [facility] services are being arranged.   Enrolled patient in [place] Start Discharge program: Yes, previously, by my partner S. Doty.   [facility] nursing team will follow while in house, and will remain available to this patient, the nursing and medical teams.   Thanks, Maudie Flakes, MSN, RN, [place], Arther Abbott  Pager# [phone]

## 2019-03-23 NOTE — Progress Notes (Signed)
Patient discharged home with home health. All discharge instructions given and all questions answered. 

## 2019-03-25 ENCOUNTER — Other Ambulatory Visit: Payer: Self-pay

## 2019-03-25 ENCOUNTER — Emergency Department: Payer: Medicare HMO

## 2019-03-25 ENCOUNTER — Telehealth: Payer: Self-pay | Admitting: *Deleted

## 2019-03-25 ENCOUNTER — Emergency Department
Admission: EM | Admit: 2019-03-25 | Discharge: 2019-03-26 | Disposition: A | Discharge status: Home / Self Care | Payer: Medicare HMO | Source: Home / Self Care | Attending: Emergency Medicine | Admitting: Emergency Medicine

## 2019-03-25 ENCOUNTER — Encounter: Payer: Self-pay | Admitting: Emergency Medicine

## 2019-03-25 DIAGNOSIS — T8141XA Infection following a procedure, superficial incisional surgical site, initial encounter: Secondary | ICD-10-CM | POA: Diagnosis not present

## 2019-03-25 DIAGNOSIS — R109 Unspecified abdominal pain: Secondary | ICD-10-CM | POA: Insufficient documentation

## 2019-03-25 DIAGNOSIS — I10 Essential (primary) hypertension: Secondary | ICD-10-CM | POA: Insufficient documentation

## 2019-03-25 DIAGNOSIS — Z79899 Other long term (current) drug therapy: Secondary | ICD-10-CM | POA: Insufficient documentation

## 2019-03-25 DIAGNOSIS — T8130XA Disruption of wound, unspecified, initial encounter: Secondary | ICD-10-CM | POA: Diagnosis not present

## 2019-03-25 DIAGNOSIS — Z7982 Long term (current) use of aspirin: Secondary | ICD-10-CM | POA: Insufficient documentation

## 2019-03-25 DIAGNOSIS — Z87891 Personal history of nicotine dependence: Secondary | ICD-10-CM | POA: Insufficient documentation

## 2019-03-25 DIAGNOSIS — R509 Fever, unspecified: Secondary | ICD-10-CM | POA: Insufficient documentation

## 2019-03-25 DIAGNOSIS — Z933 Colostomy status: Secondary | ICD-10-CM | POA: Insufficient documentation

## 2019-03-25 LAB — CBC WITH DIFFERENTIAL/PLATELET
Abs Immature Granulocytes: 1.25 K/uL — ABNORMAL HIGH (ref 0.00–0.07)
Basophils Absolute: 0.1 K/uL (ref 0.0–0.1)
Basophils Relative: 1 %
Eosinophils Absolute: 0.3 K/uL (ref 0.0–0.5)
Eosinophils Relative: 4 %
HCT: 39.6 % (ref 39.0–52.0)
Hemoglobin: 13.4 g/dL (ref 13.0–17.0)
Immature Granulocytes: 14 %
Lymphocytes Relative: 15 %
Lymphs Abs: 1.4 K/uL (ref 0.7–4.0)
MCH: 30 pg (ref 26.0–34.0)
MCHC: 33.8 g/dL (ref 30.0–36.0)
MCV: 88.8 fL (ref 80.0–100.0)
Monocytes Absolute: 1 K/uL (ref 0.1–1.0)
Monocytes Relative: 11 %
Neutro Abs: 5.1 K/uL (ref 1.7–7.7)
Neutrophils Relative %: 55 %
Platelets: 615 K/uL — ABNORMAL HIGH (ref 150–400)
RBC: 4.46 MIL/uL (ref 4.22–5.81)
RDW: 13.3 % (ref 11.5–15.5)
WBC: 9.1 K/uL (ref 4.0–10.5)
nRBC: 0.2 % (ref 0.0–0.2)

## 2019-03-25 LAB — URINALYSIS, ROUTINE W REFLEX MICROSCOPIC
Bilirubin Urine: NEGATIVE
Glucose, UA: NEGATIVE mg/dL
Hgb urine dipstick: NEGATIVE
Ketones, ur: NEGATIVE mg/dL
Leukocytes,Ua: NEGATIVE
Nitrite: NEGATIVE
Protein, ur: NEGATIVE mg/dL
Specific Gravity, Urine: 1.021 (ref 1.005–1.030)
pH: 5 (ref 5.0–8.0)

## 2019-03-25 LAB — COMPREHENSIVE METABOLIC PANEL
ALT: 60 U/L — ABNORMAL HIGH (ref 0–44)
AST: 39 U/L (ref 15–41)
Albumin: 2.6 g/dL — ABNORMAL LOW (ref 3.5–5.0)
Alkaline Phosphatase: 87 U/L (ref 38–126)
Anion gap: 11 (ref 5–15)
BUN: 24 mg/dL — ABNORMAL HIGH (ref 8–23)
CO2: 22 mmol/L (ref 22–32)
Calcium: 8.2 mg/dL — ABNORMAL LOW (ref 8.9–10.3)
Chloride: 99 mmol/L (ref 98–111)
Creatinine, Ser: 0.89 mg/dL (ref 0.61–1.24)
GFR calc Af Amer: >60 mL/min (ref >60)
GFR calc non Af Amer: >60 mL/min (ref >60)
Glucose, Bld: 139 mg/dL — ABNORMAL HIGH (ref 70–99)
Potassium: 3.6 mmol/L (ref 3.5–5.1)
Sodium: 132 mmol/L — ABNORMAL LOW (ref 135–145)
Total Bilirubin: 1.3 mg/dL — ABNORMAL HIGH (ref 0.3–1.2)
Total Protein: 6.2 g/dL — ABNORMAL LOW (ref 6.5–8.1)

## 2019-03-25 IMAGING — CT CT ABDOMEN AND PELVIS WITH CONTRAST
2 of 5 series · 15 of 46 positions shown, 17 images · IV contrast (APPLIED)
Comparison: [DATE]

CLINICAL DATA: Abdominal pain, fever, postop infection, history of
hernia repair, colostomy secondary to diverticulitis

EXAM:
CT ABDOMEN AND PELVIS WITH CONTRAST
TECHNIQUE: Multidetector CT imaging of the abdomen and pelvis was performed
using the standard protocol following bolus administration of
intravenous contrast.
CONTRAST:  100mL OMNIPAQUE IOHEXOL 300 MG/ML  SOLN

[Series 2: routine abd/pel with · axial · 0.95mm/px · z∈[-581,-96]mm · 12 of 111 slices shown, 14 images]
[im 7/111  soft-tissue]
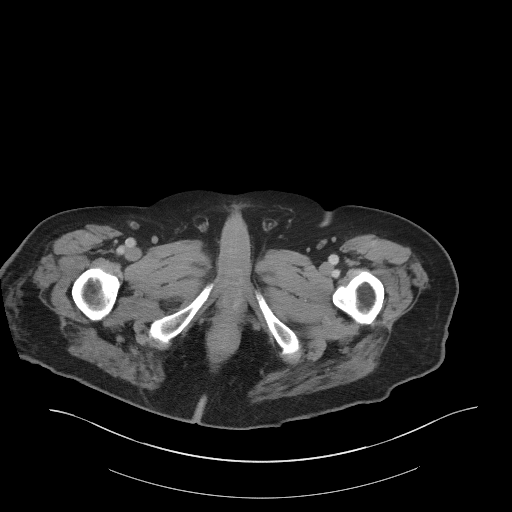
[im 7/111  bone]
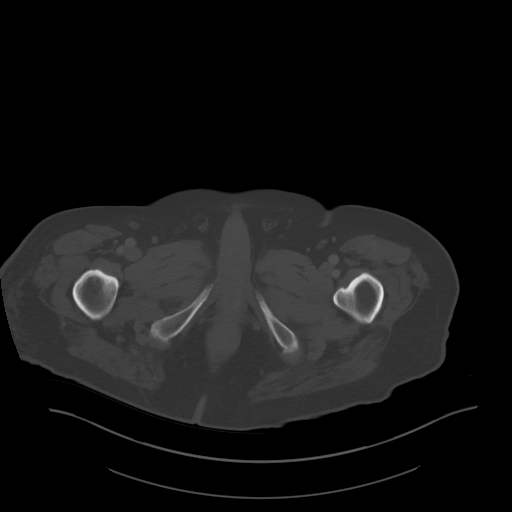
[im 14/111  soft-tissue]
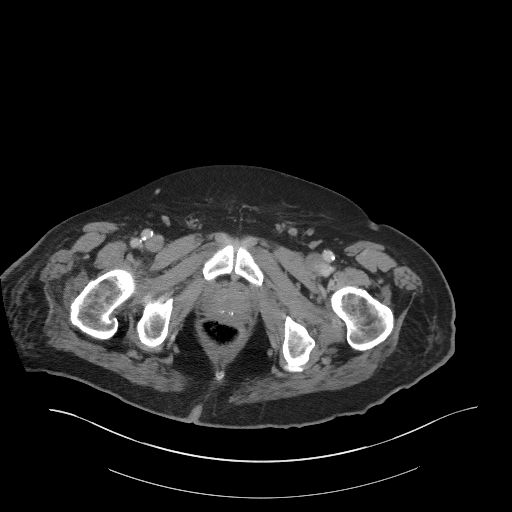
[im 28/111  soft-tissue]
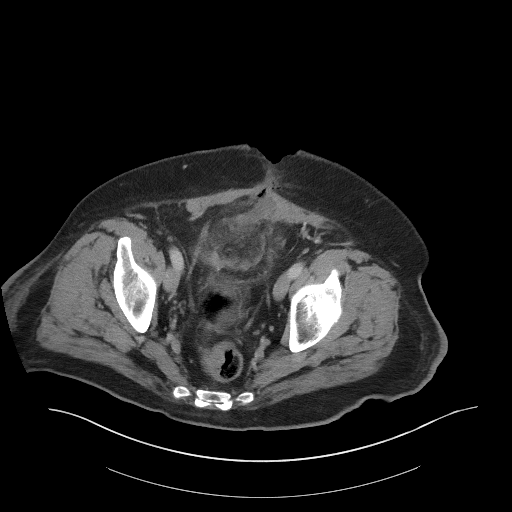
[im 35/111  soft-tissue]
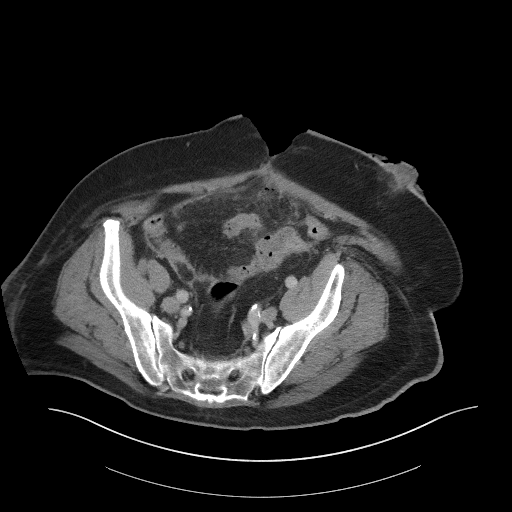
[im 42/111  soft-tissue]
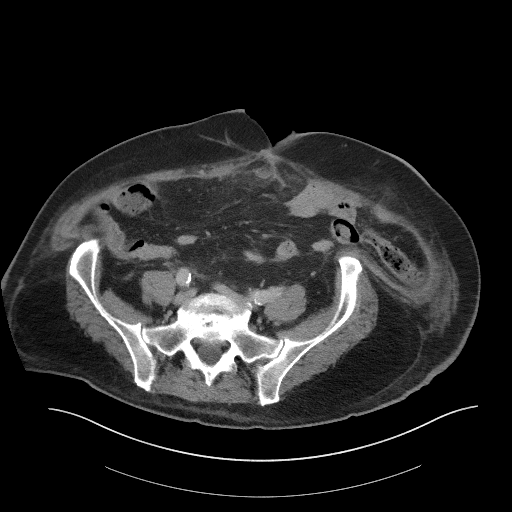
[im 49/111  soft-tissue]
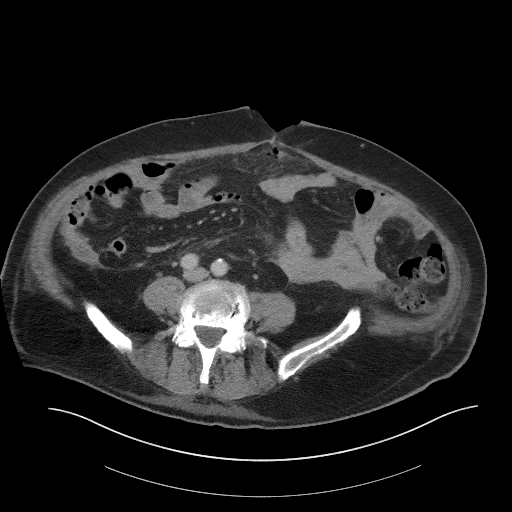
[im 62/111  soft-tissue]
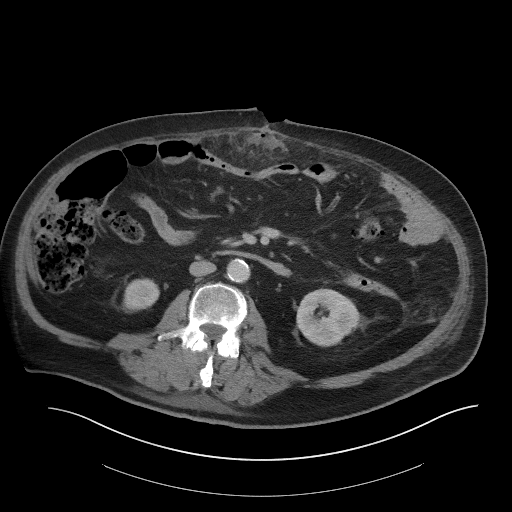
[im 69/111  soft-tissue]
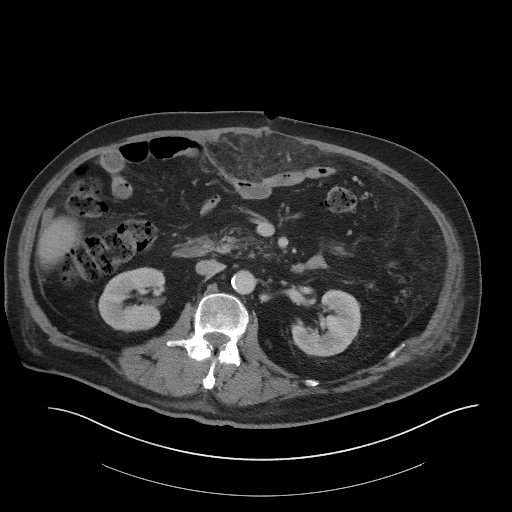
[im 76/111  soft-tissue]
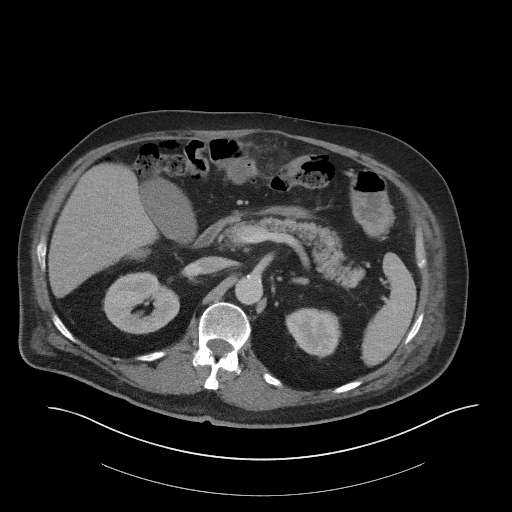
[im 76/111  bone]
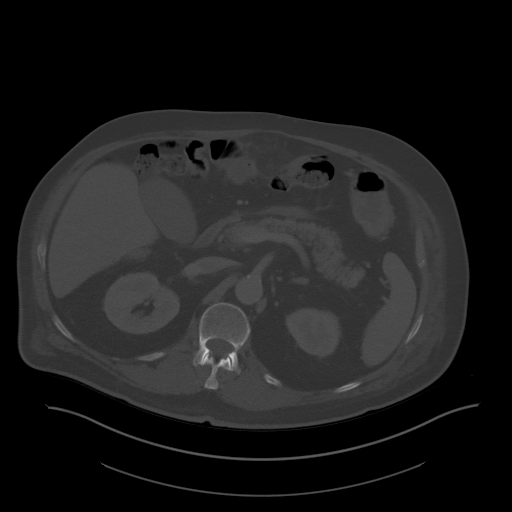
[im 83/111  soft-tissue]
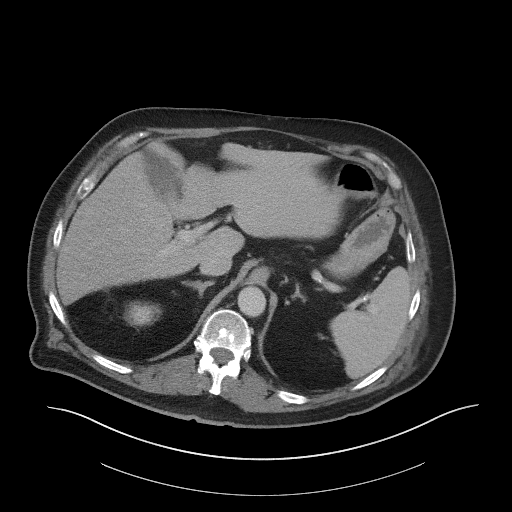
[im 97/111  soft-tissue]
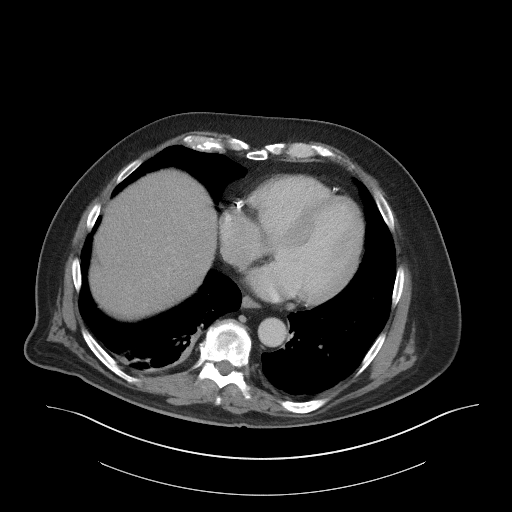
[im 104/111  soft-tissue]
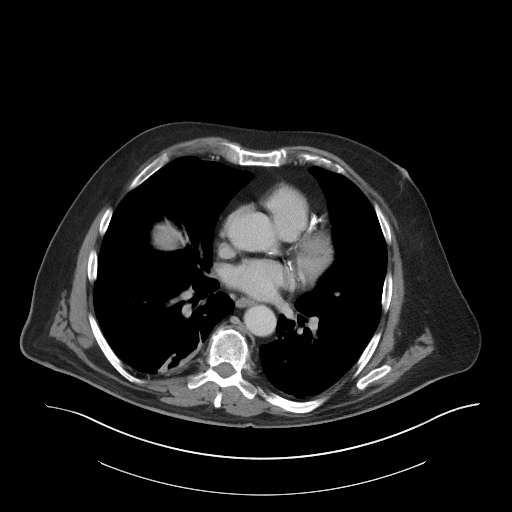

[Series 5: coronal st · coronal · 0.92mm/px · 3 of 99 slices shown]
[im 33/99  soft-tissue]
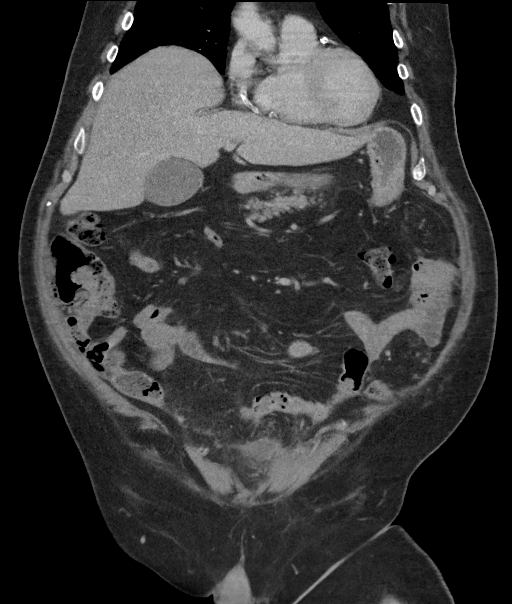
[im 44/99  soft-tissue]
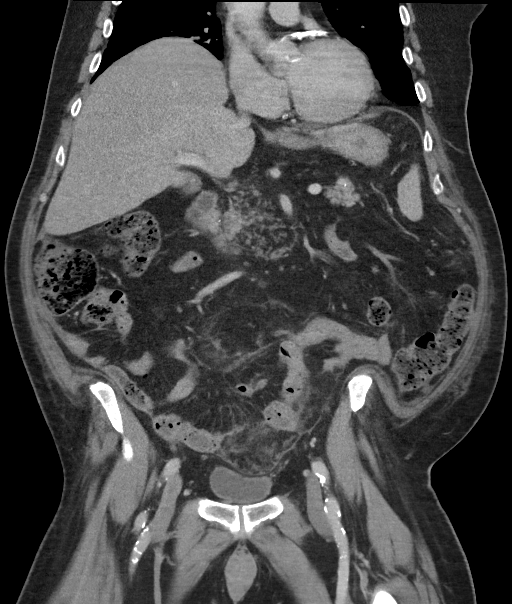
[im 55/99  soft-tissue]
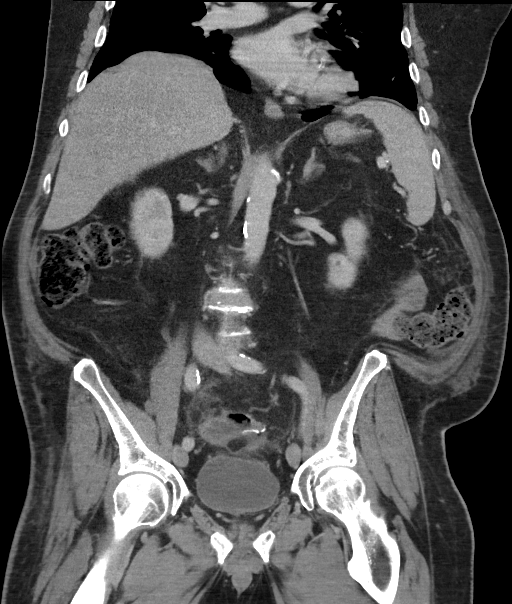

[15 of 46 positions shown; findings below may reference images not displayed]

FINDINGS: Lower chest: Bibasilar scarring or atelectasis. Extensive coronary
artery calcifications and/or stents.

Hepatobiliary: No solid liver abnormality is seen. No gallstones,
gallbladder wall thickening, or biliary dilatation.

Pancreas: Unremarkable. No pancreatic ductal dilatation or
surrounding inflammatory changes.

Spleen: Normal in size without significant abnormality.

Adrenals/Urinary Tract: Adrenal glands are unremarkable. Kidneys are
normal, without renal calculi, solid lesion, or hydronephrosis. Tiny
air locule within the urinary bladder.

Stomach/Bowel: Stomach is within normal limits. Appendix appears
normal. Interval postoperative findings of sigmoid colon resection
and left lower quadrant end ostomy. There is a substantial volume of
gas and stool within the rectal stump, which closely abuts adjacent
loops of small bowel (series 2, image 79).

Vascular/Lymphatic: Aortic atherosclerosis. No enlarged abdominal or
pelvic lymph nodes.

Reproductive: No mass or other significant abnormality.

Other: No abdominal wall hernia or abnormality. There are small air
loculations along a midline ventral abdominal wound, which is
dressed. There is a small fluid collection along the inferior
internal margin of the wound measuring approximately 4.0 x 2.3 x
cm (series 2, image 82, series 6, image 81).

Musculoskeletal: No acute or significant osseous findings.
IMPRESSION: 1. Interval postoperative findings of sigmoid colon resection and
left lower quadrant end ostomy.

2. There are small air loculations along a midline ventral abdominal
wound, which is dressed. There is a small fluid collection along the
inferior internal margin of the wound measuring approximately 4.0 x
2.3 x 2.3 cm (series 2, image 82, series 6, image 81), suspicious
for abscess.

3. There is a substantial volume of gas and stool within the rectal
stump, which closely abuts adjacent loops of small bowel (series 2,
image 79). The presence of gas within the rectal stump is suspicious
for fistula to adjacent small bowel loops. This could be
interrogated under fluoroscopy if desired.

4. Tiny air locule within the urinary bladder, although likely due
to catheterization, fistula to adjacent bowel again cannot be
strictly excluded.

## 2019-03-25 IMAGING — DX PORTABLE CHEST - 1 VIEW
2 series · 2 of 2 positions shown · non-contrast
Comparison: Radiograph [DATE]

CLINICAL DATA: Fever. Recent laparotomy with colostomy.

EXAM:
PORTABLE CHEST 1 VIEW

[chest ap (1 of 2)]
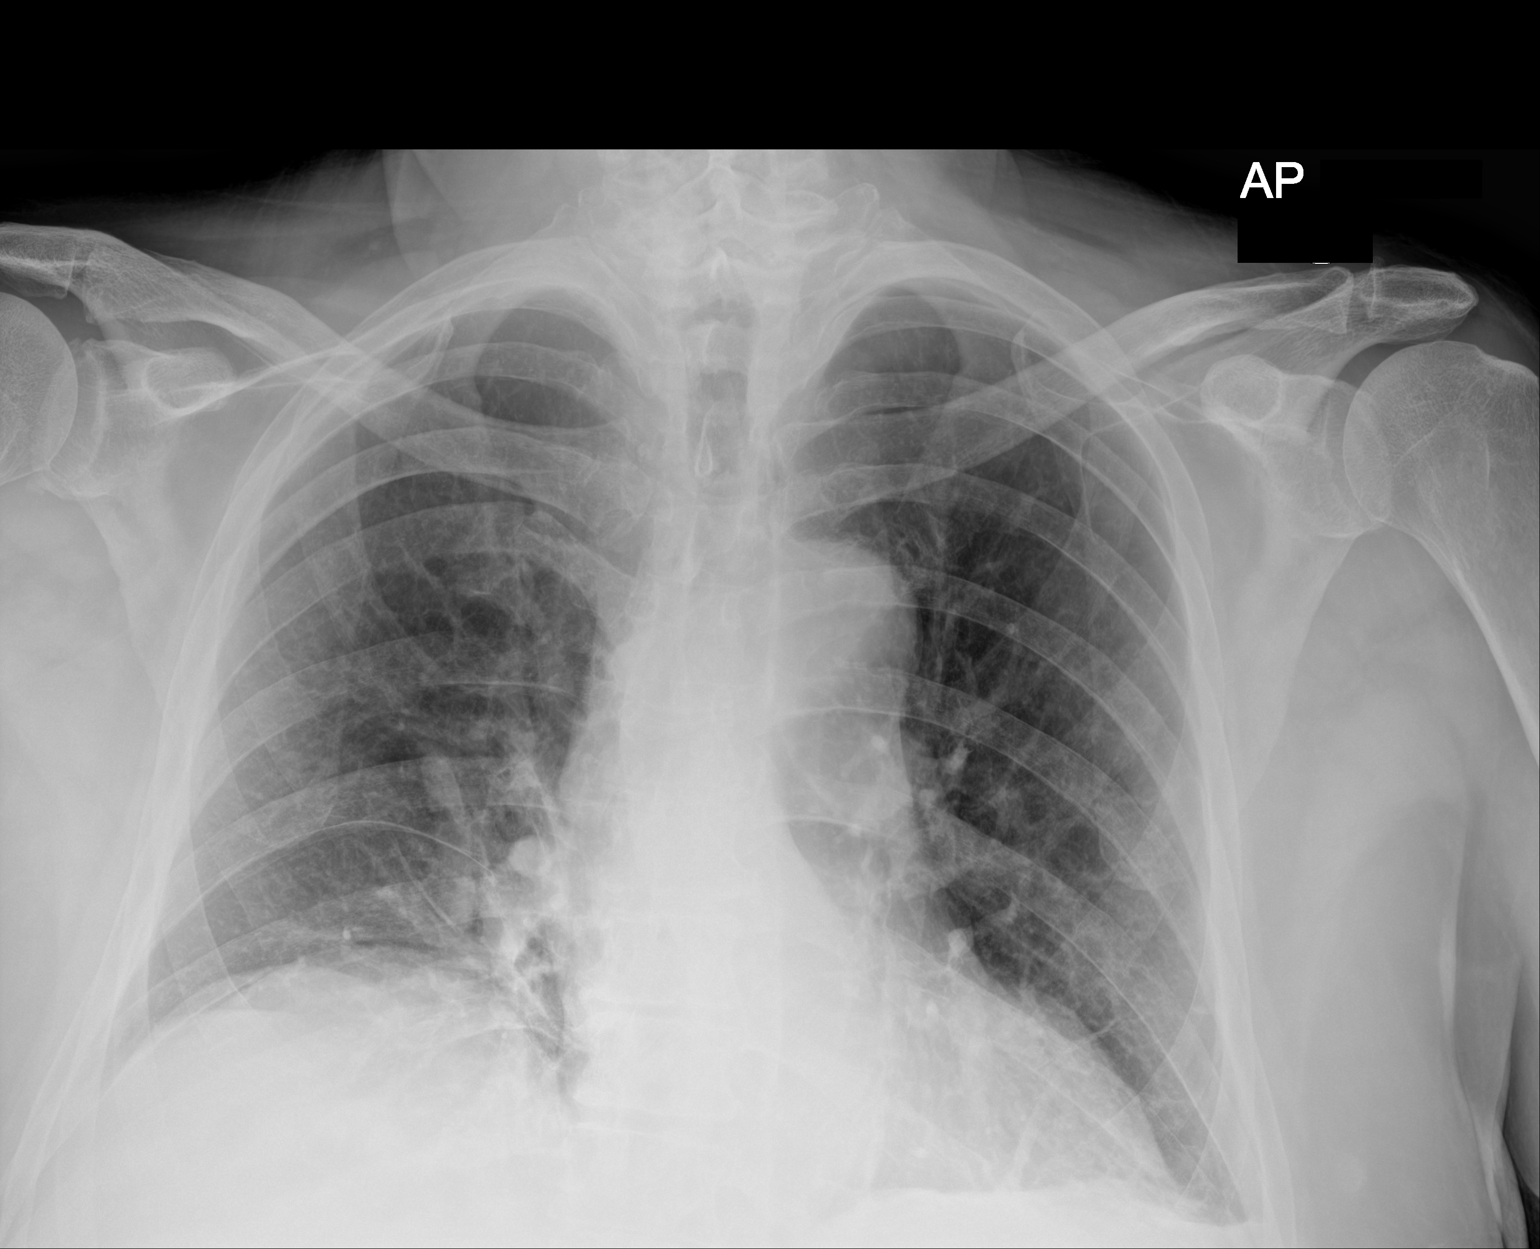

[chest ap (2 of 2)]
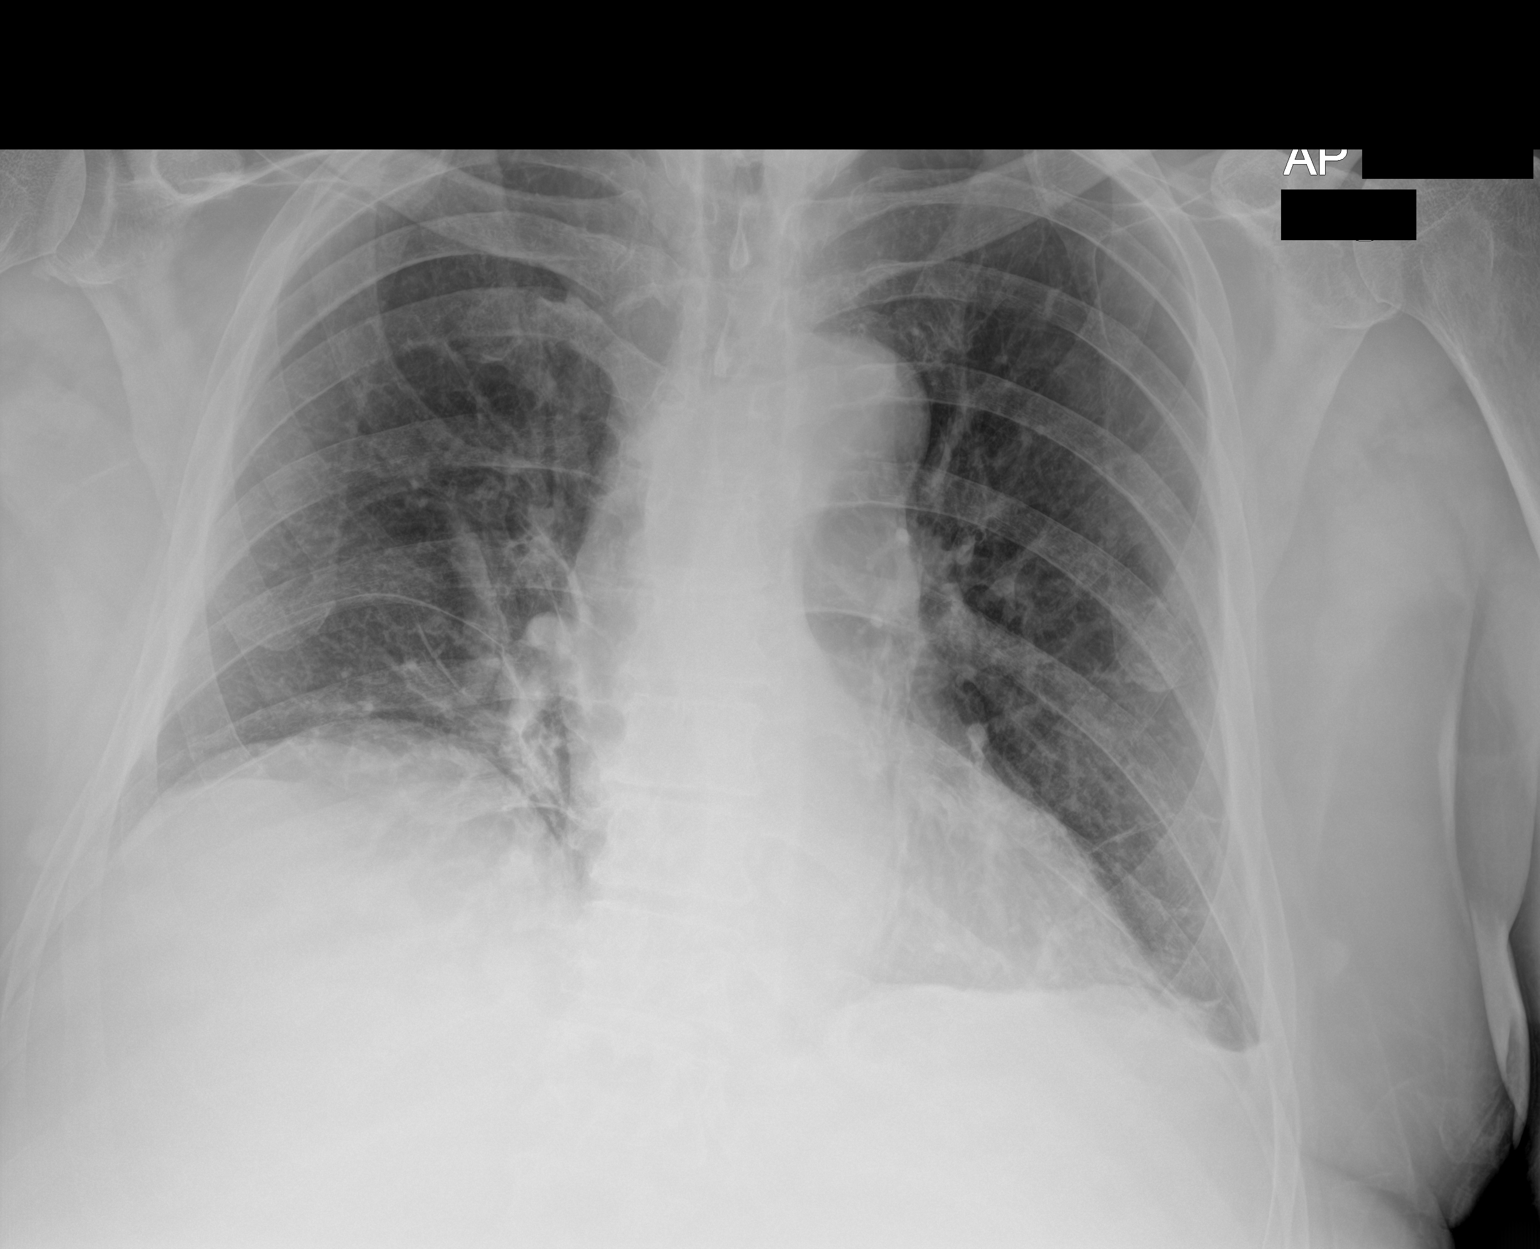

[2 of 2 positions shown; findings below may reference images not displayed]

FINDINGS: Persistent low lung volumes. Unchanged heart size and mediastinal
contours with aortic tortuosity. Streaky bibasilar opacities favor
atelectasis. Blunting of the left costophrenic angle, likely small
effusion. No pneumothorax.
IMPRESSION: Low lung volumes with streaky bibasilar opacities, favor
atelectasis. Possible small left pleural effusion.

## 2019-03-25 MED ORDER — SODIUM CHLORIDE 0.9 % IV SOLN
1.0000 g | Freq: Once | INTRAVENOUS | Status: AC
  Administered 2019-03-25: 1 g via INTRAVENOUS
  Filled 2019-03-25: qty 10

## 2019-03-25 MED ORDER — METRONIDAZOLE IN NACL 5-0.79 MG/ML-% IV SOLN
500.0000 mg | Freq: Once | INTRAVENOUS | Status: AC
  Administered 2019-03-25: 500 mg via INTRAVENOUS
  Filled 2019-03-25: qty 100

## 2019-03-25 MED ORDER — SODIUM CHLORIDE 0.9 % IV BOLUS
1000.0000 mL | Freq: Once | INTRAVENOUS | Status: AC
  Administered 2019-03-25: 1000 mL via INTRAVENOUS

## 2019-03-25 MED ORDER — METRONIDAZOLE 250 MG PO TABS: 500.0000 mg | ORAL_TABLET | Freq: Two times a day (BID) | ORAL | 0 refills | Status: DC

## 2019-03-25 MED ORDER — IOHEXOL 300 MG/ML  SOLN
100.0000 mL | Freq: Once | INTRAMUSCULAR | Status: AC | PRN
  Administered 2019-03-25: 100 mL via INTRAVENOUS

## 2019-03-25 MED ORDER — CEFDINIR 300 MG PO CAPS: 300.0000 mg | ORAL_CAPSULE | Freq: Two times a day (BID) | ORAL | 0 refills | Status: AC

## 2019-03-25 NOTE — Telephone Encounter (Signed)
[name] from [facility] needs a call back regarding patients wound, he is now running a fever. Her number is [phone]

## 2019-03-25 NOTE — ED Notes (Signed)
Pt given a urinal and told to give a specimen when possible. Pt colostomy bag emptied and changed. IV obtained.

## 2019-03-25 NOTE — ED Triage Notes (Signed)
Pt arrives with concerns over possible post op infection. Pt reports he had a fever of 100.3 prior to arrival. Pt reports he took tylenol at 1630. Pt denies pain or n/v/d.

## 2019-03-25 NOTE — Telephone Encounter (Signed)
[name] stated his wound look as if there was inflammation present but not infection. Colostomy dressing to be changed Friday.  [name] instructed patient to take tylenol for low grade fever.    I spoke with patient wife and son and was told he just isn't feeling well. Denies nausea or vomiting, however he is  Having chills, appetite poor. Shaking. Fatigue, weak. Skin clammy and sweaty.     Patient's wife was instructed to go to emergency room for possible dehydration due to patient not drinking or eating.

## 2019-03-25 NOTE — ED Provider Notes (Signed)
[facility] Emergency Department Provider Note  ____________________________________________   First MD Initiated Contact with Patient 03/25/19 2000     (approximate)  I have reviewed the triage vital signs and the nursing notes.   HISTORY  Chief Complaint Post-op Problem    HPI Tyrone Luna is a 73 y.o. male with diverticulitis who is status post sigmoid colectomy and colostomy placement on 7/19 who presents for fevers.  Patient had a fever that started today of 100.3.  Patient took Tylenol around 430.  Given his recent surgery he presented today.  His fever has been 1 time, better with Tylenol, nothing makes it worse.  He denies any associated symptoms such as abdominal pain, cough, shortness of breath, dysuria.          Past Medical History:  Diagnosis Date   Arthritis    BPH associated with nocturia    ED (erectile dysfunction)    Erectile dysfunction    Hyperlipemia    Hypertension    Male hypogonadism    Sleep apnea    Urinary frequency    Urinary urgency     Patient Active Problem List   Diagnosis Date Noted   Diverticulitis large intestine 03/12/2019   Morbid obesity due to excess calories ([place]) 05/18/2015   BPH with obstruction/lower urinary tract symptoms 05/05/2015   Nocturia 05/05/2015   Erectile dysfunction of organic origin 05/05/2015   ED (erectile dysfunction) of organic origin 03/22/2015   Allergy to environmental factors 03/22/2015   Hyperlipidemia, unspecified 03/22/2015   BP (high blood pressure) 03/22/2015   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 Authorizing Provider  amLODipine (NORVASC) 10 MG tablet Take 10 mg by mouth daily.  03/18/15   [provider]  aspirin 81 MG tablet Take 81 mg by mouth daily.    [provider]  augmented betamethasone dipropionate (DIPROLENE-AF) 0.05 % cream Apply topically. 11/27/16   [provider]  cetirizine (ZYRTEC) 10 MG tablet Take 10 mg by mouth daily.    [provider]  dutasteride (AVODART) 0.5 MG capsule Take 1 capsule (0.5 mg total) by mouth daily. 03/17/18   McGowan, Hunt Oris, PA-C  EPIPEN 2-PAK 0.3 MG/0.3ML SOAJ injection  05/03/15   [provider]  fluticasone Asencion Islam) 50 MCG/ACT nasal spray  08/27/16   [provider]  Glucosamine-Chondroit-Vit C-Mn (GLUCOSAMINE 1500 COMPLEX PO) Take by mouth.    [provider]  loratadine-pseudoephedrine (CLARITIN-D 24-HOUR) 10-240 MG 24 hr tablet Take 1 tablet by mouth daily.    [provider]  Multiple Vitamins-Minerals (PRESERVISION AREDS 2 PO) Take by mouth.    [provider]  omeprazole (PRILOSEC) 40 MG capsule Take 40 mg by mouth daily. 01/22/18   [provider]  oxyCODONE (OXY IR/ROXICODONE) 5 MG immediate release tablet Take 1 tablet (5 mg total) by mouth every 6 (six) hours as needed for severe pain. 03/23/19   Tylene Fantasia, PA-C  pilocarpine (SALAGEN) 5 MG tablet Take 5 mg by mouth 2 (two) times daily.  03/18/15  [provider]  sildenafil (REVATIO) 20 MG tablet Take 3 to 5 tablets two hours before intercouse on an empty stomach.  Do not take with nitrates. 06/04/16   Zara Council A, PA-C  simvastatin (ZOCOR) 20 MG tablet Take 20 mg by mouth daily at 6 PM.  03/18/15   [provider]  tadalafil (CIALIS) 20 MG tablet Take 1 tablet (20 mg total) by mouth daily as needed for erectile dysfunction. 03/22/15   Hollice Espy, MD  triamcinolone cream (KENALOG) 0.5 %  APPLY TOPICALLY TWO TIMES DAILY FOR UP TO 7 TO 10 DAYS. 03/13/18   [provider]  triamterene-hydrochlorothiazide (DYAZIDE) 37.5-25 MG per capsule Take 1 capsule by mouth daily.  03/18/15   [provider]  Trospium Chloride 60 MG CP24 Take 1 capsule (60 mg total) by mouth daily. Patient not taking: Reported on 03/12/2019 06/04/16   Zara Council A, PA-C    Allergies Patient has no known allergies.  Family History  Problem Relation Age of Onset   Hypertension Other    Prostate cancer Neg Hx    Bladder Cancer Neg Hx    Kidney cancer Neg Hx     Social History Social History   Tobacco Use   Smoking status: Former Smoker   Smokeless tobacco: Never Used   Tobacco comment: quit 50 years ago  Substance Use Topics   Alcohol use: No    Alcohol/week: 0.0 standard drinks   Drug use: No      Review of Systems Constitutional: Positive for fevers Eyes: No visual changes. ENT: No sore throat. Cardiovascular: Denies chest pain. Respiratory: Denies shortness of breath. Gastrointestinal: No abdominal pain.  No nausea, no vomiting.  No diarrhea.  No constipation. Genitourinary: Negative for dysuria. Musculoskeletal: Negative for back pain. Skin: Negative for rash. Neurological: Negative for headaches, focal weakness or numbness. All other ROS negative ____________________________________________   PHYSICAL EXAM:  VITAL SIGNS: ED Triage Vitals  Enc Vitals Group     BP 03/25/19 1754 127/72     Pulse Rate 03/25/19 1754 86     Resp --      Temp 03/25/19 1754 98.9 F (37.2 C)     Temp Source 03/25/19 1754 Oral     SpO2 03/25/19 1754 93 %     Weight 03/25/19 1759 250 lb (113.4 kg)     Height 03/25/19 1759 6\' 1"  (1.854 m)     Head Circumference --      Peak Flow --      Pain Score 03/25/19 1802 0     Pain Loc --      Pain Edu? --      Excl. in [place]? --     Constitutional: Alert and oriented. Well appearing and in no acute distress. Eyes:  Conjunctivae are normal. EOMI. Head: Atraumatic. Nose: No congestion/rhinnorhea. Mouth/Throat: Mucous membranes are moist.   Neck: No stridor. Trachea Midline. FROM Cardiovascular: Normal rate, regular rhythm. Grossly normal heart sounds.  Good peripheral circulation. Respiratory: Normal respiratory effort.  No retractions. Lungs CTAB. Gastrointestinal: Soft and nontender. No distention. No abdominal bruits.  Colostomy bag with brown stool.  Opened ventral incision with mild erythema around it.  Musculoskeletal: No lower extremity tenderness nor edema.  No joint effusions. Neurologic:  Normal speech and language. No gross focal neurologic deficits are appreciated.  Skin:  Skin is warm, dry and intact. No rash noted. Psychiatric: Mood and affect are normal. Speech and behavior are normal. GU: Deferred   ____________________________________________  LABS (all labs ordered are listed, but only abnormal results are displayed)  Labs Reviewed  CBC WITH DIFFERENTIAL/PLATELET - Abnormal; Notable for the following components:      Result Value   Platelets 615 (*)    Abs Immature Granulocytes 1.25 (*)    All other components within normal limits  COMPREHENSIVE METABOLIC PANEL - Abnormal; Notable for the following components:   Sodium 132 (*)    Glucose, Bld 139 (*)    BUN 24 (*)    Calcium 8.2 (*)    Total Protein 6.2 (*)    Albumin 2.6 (*)    ALT 60 (*)    Total Bilirubin 1.3 (*)    All other components within normal limits  URINALYSIS, ROUTINE W REFLEX MICROSCOPIC - Abnormal; Notable for the following components:   Color, Urine YELLOW (*)    APPearance CLEAR (*)    All other components within normal limits  PATHOLOGIST SMEAR REVIEW   ____________________________________________ RADIOLOGY  Reviewed chest x-ray and it was negative for infection.  Official radiology report(s): Ct Abdomen Pelvis W Contrast  Result Date: 03/25/2019 CLINICAL DATA:  Abdominal pain, fever, postop  infection, history of hernia repair, colostomy secondary to diverticulitis EXAM: CT ABDOMEN AND PELVIS WITH CONTRAST TECHNIQUE: Multidetector CT imaging of the abdomen and pelvis was performed using the standard protocol following bolus administration of intravenous contrast. CONTRAST:  147mL OMNIPAQUE IOHEXOL 300 MG/ML  SOLN COMPARISON:  03/12/2019 FINDINGS: Lower chest: Bibasilar scarring or atelectasis. Extensive coronary artery calcifications and/or stents. Hepatobiliary: No solid liver abnormality is seen. No gallstones, gallbladder wall thickening, or biliary dilatation. Pancreas: Unremarkable. No pancreatic ductal dilatation or surrounding inflammatory changes. Spleen: Normal in size without significant abnormality. Adrenals/Urinary Tract: Adrenal glands are unremarkable. Kidneys are normal, without renal calculi, solid lesion, or hydronephrosis. Tiny air locule within the urinary bladder. Stomach/Bowel: Stomach is within normal limits. Appendix appears normal. Interval postoperative findings of sigmoid colon resection and left lower quadrant end ostomy. There is a substantial volume of gas and stool within the rectal stump, which closely abuts adjacent loops of small bowel (series 2, image 79). Vascular/Lymphatic: Aortic atherosclerosis. No enlarged abdominal or pelvic lymph nodes. Reproductive: No mass or other significant abnormality. Other: No abdominal wall hernia or abnormality. There are small air loculations along a midline ventral abdominal wound, which is dressed. There is a small fluid collection along the inferior internal margin of the wound measuring approximately 4.0 x 2.3 x 2.3 cm (series 2, image 82, series 6, image 81). Musculoskeletal: No acute or significant osseous findings. IMPRESSION: 1. Interval postoperative findings of sigmoid colon resection and left lower quadrant end ostomy. 2. There are small air loculations along a midline ventral abdominal wound, which is dressed. There is a  small fluid collection along the inferior internal margin of the wound measuring approximately 4.0 x 2.3 x 2.3 cm (series 2, image 82, series 6, image 81), suspicious for abscess. 3. There is a substantial volume of gas and stool within the rectal stump, which closely abuts adjacent loops of small bowel (series 2, image 79). The presence of gas within the rectal stump is suspicious for fistula to adjacent small bowel loops. This could be interrogated under fluoroscopy if desired. 4. Tiny air locule within the urinary bladder, although likely due to catheterization, fistula to adjacent bowel again cannot be strictly excluded. Electronically Signed   By: Eddie Candle M.D.   On: 03/25/2019 21:32   Dg Chest Portable 1 View  Result Date: 03/25/2019 CLINICAL  DATA:  Fever. Recent laparotomy with colostomy. EXAM: PORTABLE CHEST 1 VIEW COMPARISON:  Radiograph 03/12/2019 FINDINGS: Persistent low lung volumes. Unchanged heart size and mediastinal contours with aortic tortuosity. Streaky bibasilar opacities favor atelectasis. Blunting of the left costophrenic angle, likely small effusion. No pneumothorax. IMPRESSION: Low lung volumes with streaky bibasilar opacities, favor atelectasis. Possible small left pleural effusion. Electronically Signed   By: Keith Rake M.D.   On: 03/25/2019 20:29    ____________________________________________   PROCEDURES  Procedure(s) performed (including Critical Care):  Procedures   ____________________________________________   INITIAL IMPRESSION / ASSESSMENT AND PLAN / ED COURSE  Tyrone Luna was evaluated in Emergency Department on 03/25/2019 for the symptoms described in the history of present illness. He was evaluated in the context of the global COVID-19 pandemic, which necessitated consideration that the patient might be at risk for infection with the SARS-CoV-2 virus that causes COVID-19. Institutional protocols and algorithms that pertain to the evaluation  of patients at risk for COVID-19 are in a state of rapid change based on information released by regulatory bodies including the CDC and federal and state organizations. These policies and algorithms were followed during the patient's care in the ED.    Patient presents with a fever of 100.3 after recent abdominal surgery.  Will get CT abdomen to evaluate for deeper abscess.  Possible some mild cellulitis around the wound but it sounds like that is been there since surgery.  Patient is afebrile here and well-appearing.  Patient denies symptoms to suggest UTI or pneumonia.  Low suspicion for bacteremia.   Patient continues to be afebrile.  Discussed with Dr. Dahlia Byes.  He reviewed CT personally and did not think there was an abscess or fistula.  However given the slight erythema around the wound and his prior resistances with his E. coli recommended giving a dose of ceftriaxone and Flagyl IV and sending patient home on cefdinir and Flagyl.  He would then call tomorrow to get follow-up tomorrow with patient.  Patient discharged home  I discussed the provisional nature of ED diagnosis, the treatment so far, the ongoing plan of care, follow up appointments and return precautions with the patient and any family or support people present. They expressed understanding and agreed with the plan, discharged home.   ____________________________________________   FINAL CLINICAL IMPRESSION(S) / ED DIAGNOSES   Final diagnoses:  Fever, unspecified fever cause      MEDICATIONS GIVEN DURING THIS VISIT:  Medications  metroNIDAZOLE (FLAGYL) IVPB 500 mg (has no administration in time range)  cefTRIAXone (ROCEPHIN) 1 g in sodium chloride 0.9 % 100 mL IVPB (has no administration in time range)  iohexol (OMNIPAQUE) 300 MG/ML solution 100 mL (100 mLs Intravenous Contrast Given 03/25/19 2106)  sodium chloride 0.9 % bolus 1,000 mL (1,000 mLs Intravenous New Bag/Given 03/25/19 2201)     ED Discharge Orders          Ordered    cefdinir (OMNICEF) 300 MG capsule  2 times daily     03/25/19 2214    metroNIDAZOLE (FLAGYL) 250 MG tablet  2 times daily     03/25/19 2214           Note:  This document was prepared using Dragon voice recognition software and may include unintentional dictation errors.   Vanessa [place], MD 03/25/19 2215

## 2019-03-25 NOTE — Discharge Instructions (Addendum)
Call your surgeon tomorrow to schedule an appointment for tomorrow.  Take the antibiotics.  Return to the ER for worsening fevers, abdominal pain or any other concerns.

## 2019-03-25 NOTE — ED Notes (Signed)
Patient is resting comfortably. 

## 2019-03-26 ENCOUNTER — Encounter: Payer: Self-pay | Admitting: Physician Assistant

## 2019-03-26 ENCOUNTER — Other Ambulatory Visit: Payer: Self-pay

## 2019-03-26 ENCOUNTER — Ambulatory Visit (INDEPENDENT_AMBULATORY_CARE_PROVIDER_SITE_OTHER): Payer: Medicare HMO | Admitting: Physician Assistant

## 2019-03-26 VITALS — BP 124/77 | HR 100 | Temp 97.5°F | Resp 18 | Ht 73.0 in | Wt 250.0 lb

## 2019-03-26 DIAGNOSIS — Z09 Encounter for follow-up examination after completed treatment for conditions other than malignant neoplasm: Secondary | ICD-10-CM

## 2019-03-26 DIAGNOSIS — K5720 Diverticulitis of large intestine with perforation and abscess without bleeding: Secondary | ICD-10-CM

## 2019-03-26 LAB — PATHOLOGIST SMEAR REVIEW

## 2019-03-26 NOTE — Progress Notes (Signed)
[facility] POST-OP OFFICE VISIT  03/26/2019  HPI: Tyrone Luna is a 73 y.o. male 11 days s/p Hartman's Procedure for perforated diverticulitis. He was discharged from the hospital on 07/27.   He presented to the ED last night secondary to fevers. He was reporting 100.3 fever at home which resolved with tylenol. CT was concerning for possible abscess of inferior portion of midline wound. He was discharged home with Cefdinir and Flagyl x7 days and with follow up today.   Today, he presents with wife and son. At home, he continues to have issues with fatigue and weakness but is working with PT/OT. He has diminished PO intake although they are supplementing with Ensure/Gatorade. He has to empty stool from the colostomy 1-2 times daily and this is reportedly thickened. Still with frequent flatus from ostomy. Only 1 isolated fever since discharge. No SOB, CP, nausea, or emesis. No other issues.    Vital signs: BP 124/77   Pulse 100   Temp (!) 97.5 F (36.4 C) (Temporal)   Resp 18   Ht 6\' 1"  (1.854 m)   Wt 250 lb (113.4 kg)   SpO2 91%   BMI 32.98 kg/m    Physical Exam: Constitutional: Well appearing male, NAD, face covered with mask secondary to COVID 19 restrictions Abdomen: Soft, non-tender, non distended, no rebound of guarding. Colostomy in Left abdomen is pink, patent, with semi-solid stool and air in bag.  Skin: His midline incision is healing via secondary intention, there is pink granulation tissue throughout the wound. Fascia appears intact. At the inferior portion there is expressible fibrinous to mixed purulent drainage, the surrounding skin is not erythema nor warm, there is no gross fluctuance palpable.   Midline Wound (03/26/2019):       Assessment/Plan: This is a 73 y.o. male who is overall doing okay still fairly deconditioned with question of possible wound infection to 11 days s/p Hartman's Procedure   - Complete 7 day course of Cefdinir +  Flagyl  - Tylenol for fever control prn  - Encouraged adequate PO intake (ex: Gatorade, Pedialyte) + Nutritional Supplementation (ex: Ensure)  - Monitor colostomy output  - Wet-to-dry dressing changes daily; HHRN twice a week  - Continue to mobilize as tolerates; HH PT+OT  - Signs/Symptoms of worsening infection discussed with patient and family  - Follow up on Monday 08/03 with Dr Dahlia Byes as scheduled, encouraged to call with questions/concerns.   -- Tyrone Simon, PA-C [facility] 03/26/2019, 11:19 AM [phone] M-F: 7am - 4pm

## 2019-03-26 NOTE — Patient Instructions (Signed)
Pick up the Antibiotic at the drug store today and begin taking it.   Be sure to eat and drink especially boost / ensure. Stay hydrated.   Tylenol for fevers.  Please see your follow up appointment listed below.  Continue to change the dressing daily and if you notice thick pus drainage and redness on the skin surrounding the wound let us know.

## 2019-03-28 ENCOUNTER — Inpatient Hospital Stay
Admission: EM | Admit: 2019-03-28 | Discharge: 2019-03-31 | DRG: 863 | Disposition: A | Discharge status: Home Health | Payer: Medicare HMO | Attending: Surgery | Admitting: Surgery

## 2019-03-28 ENCOUNTER — Encounter: Payer: Self-pay | Admitting: Emergency Medicine

## 2019-03-28 ENCOUNTER — Other Ambulatory Visit: Payer: Self-pay

## 2019-03-28 ENCOUNTER — Emergency Department: Payer: Medicare HMO

## 2019-03-28 DIAGNOSIS — N401 Enlarged prostate with lower urinary tract symptoms: Secondary | ICD-10-CM | POA: Diagnosis present

## 2019-03-28 DIAGNOSIS — Z933 Colostomy status: Secondary | ICD-10-CM

## 2019-03-28 DIAGNOSIS — Z9049 Acquired absence of other specified parts of digestive tract: Secondary | ICD-10-CM | POA: Diagnosis not present

## 2019-03-28 DIAGNOSIS — E669 Obesity, unspecified: Secondary | ICD-10-CM | POA: Diagnosis present

## 2019-03-28 DIAGNOSIS — T8149XA Infection following a procedure, other surgical site, initial encounter: Secondary | ICD-10-CM

## 2019-03-28 DIAGNOSIS — Z79891 Long term (current) use of opiate analgesic: Secondary | ICD-10-CM | POA: Diagnosis not present

## 2019-03-28 DIAGNOSIS — I10 Essential (primary) hypertension: Secondary | ICD-10-CM | POA: Diagnosis present

## 2019-03-28 DIAGNOSIS — E785 Hyperlipidemia, unspecified: Secondary | ICD-10-CM | POA: Diagnosis present

## 2019-03-28 DIAGNOSIS — K5780 Diverticulitis of intestine, part unspecified, with perforation and abscess without bleeding: Secondary | ICD-10-CM | POA: Diagnosis present

## 2019-03-28 DIAGNOSIS — T8130XA Disruption of wound, unspecified, initial encounter: Secondary | ICD-10-CM

## 2019-03-28 DIAGNOSIS — R351 Nocturia: Secondary | ICD-10-CM | POA: Diagnosis present

## 2019-03-28 DIAGNOSIS — Z20828 Contact with and (suspected) exposure to other viral communicable diseases: Secondary | ICD-10-CM | POA: Diagnosis present

## 2019-03-28 DIAGNOSIS — Z79899 Other long term (current) drug therapy: Secondary | ICD-10-CM

## 2019-03-28 DIAGNOSIS — Z7982 Long term (current) use of aspirin: Secondary | ICD-10-CM | POA: Diagnosis not present

## 2019-03-28 DIAGNOSIS — Z7951 Long term (current) use of inhaled steroids: Secondary | ICD-10-CM

## 2019-03-28 DIAGNOSIS — T8141XA Infection following a procedure, superficial incisional surgical site, initial encounter: Secondary | ICD-10-CM | POA: Diagnosis present

## 2019-03-28 DIAGNOSIS — Z8249 Family history of ischemic heart disease and other diseases of the circulatory system: Secondary | ICD-10-CM | POA: Diagnosis not present

## 2019-03-28 DIAGNOSIS — L089 Local infection of the skin and subcutaneous tissue, unspecified: Secondary | ICD-10-CM | POA: Diagnosis present

## 2019-03-28 DIAGNOSIS — T148XXA Other injury of unspecified body region, initial encounter: Secondary | ICD-10-CM | POA: Diagnosis present

## 2019-03-28 DIAGNOSIS — Z6832 Body mass index (BMI) 32.0-32.9, adult: Secondary | ICD-10-CM

## 2019-03-28 LAB — CBC WITH DIFFERENTIAL/PLATELET
Abs Immature Granulocytes: 1.83 10*3/uL — ABNORMAL HIGH (ref 0.00–0.07)
Basophils Absolute: 0 10*3/uL (ref 0.0–0.1)
Basophils Relative: 0 %
Eosinophils Absolute: 0.3 10*3/uL (ref 0.0–0.5)
Eosinophils Relative: 3 %
HCT: 38.4 % — ABNORMAL LOW (ref 39.0–52.0)
Hemoglobin: 12.7 g/dL — ABNORMAL LOW (ref 13.0–17.0)
Immature Granulocytes: 21 %
Lymphocytes Relative: 11 %
Lymphs Abs: 1 10*3/uL (ref 0.7–4.0)
MCH: 29.8 pg (ref 26.0–34.0)
MCHC: 33.1 g/dL (ref 30.0–36.0)
MCV: 90.1 fL (ref 80.0–100.0)
Monocytes Absolute: 0.8 10*3/uL (ref 0.1–1.0)
Monocytes Relative: 9 %
Neutro Abs: 5 10*3/uL (ref 1.7–7.7)
Neutrophils Relative %: 56 %
Platelets: 620 10*3/uL — ABNORMAL HIGH (ref 150–400)
RBC: 4.26 MIL/uL (ref 4.22–5.81)
RDW: 13.6 % (ref 11.5–15.5)
WBC: 8.9 10*3/uL (ref 4.0–10.5)
nRBC: 0.5 % — ABNORMAL HIGH (ref 0.0–0.2)

## 2019-03-28 LAB — BASIC METABOLIC PANEL
Anion gap: 10 (ref 5–15)
BUN: 16 mg/dL (ref 8–23)
CO2: 24 mmol/L (ref 22–32)
Calcium: 8.2 mg/dL — ABNORMAL LOW (ref 8.9–10.3)
Chloride: 100 mmol/L (ref 98–111)
Creatinine, Ser: 0.89 mg/dL (ref 0.61–1.24)
GFR calc Af Amer: >60 mL/min (ref >60)
GFR calc non Af Amer: >60 mL/min (ref >60)
Glucose, Bld: 153 mg/dL — ABNORMAL HIGH (ref 70–99)
Potassium: 3.7 mmol/L (ref 3.5–5.1)
Sodium: 134 mmol/L — ABNORMAL LOW (ref 135–145)

## 2019-03-28 LAB — SARS CORONAVIRUS 2 BY RT PCR (HOSPITAL ORDER, PERFORMED IN ~~LOC~~ HOSPITAL LAB): SARS Coronavirus 2: NEGATIVE

## 2019-03-28 IMAGING — CT CT ABDOMEN AND PELVIS WITH CONTRAST
2 of 5 series · 16 of 46 positions shown, 18 images · IV contrast (APPLIED)
Comparison: [DATE]

CLINICAL DATA: Status post colectomy for diverticulitis [DATE]. Acute abdomen pain, fever.

EXAM:
CT ABDOMEN AND PELVIS WITH CONTRAST
TECHNIQUE: Multidetector CT imaging of the abdomen and pelvis was performed
using the standard protocol following bolus administration of
intravenous contrast.
CONTRAST:  100mL OMNIPAQUE IOHEXOL 300 MG/ML  SOLN

[Series 2: routine abd/pel with · axial · 0.97mm/px · z∈[-365,+100]mm · 13 of 109 slices shown, 15 images]
[im 8/109  soft-tissue]
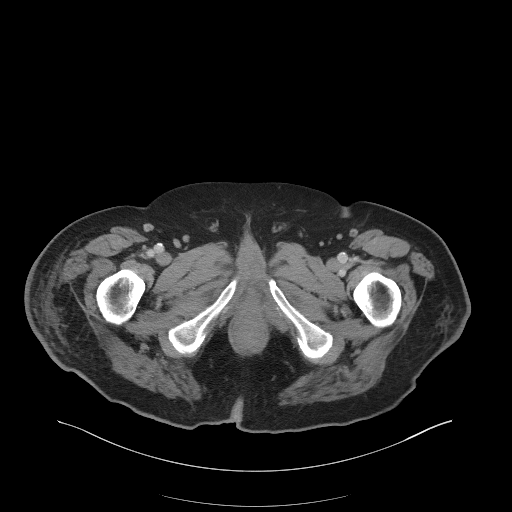
[im 8/109  bone]
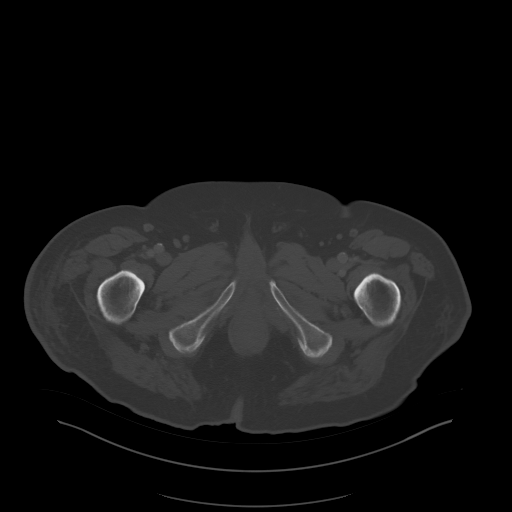
[im 16/109  soft-tissue]
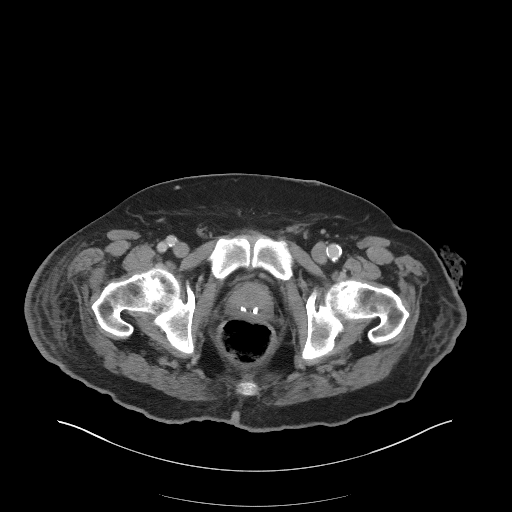
[im 24/109  soft-tissue]
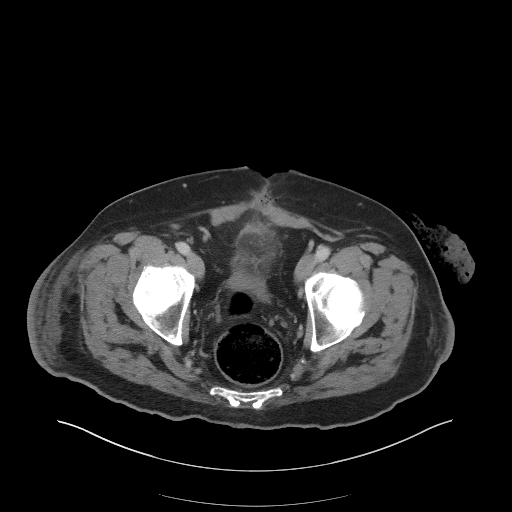
[im 31/109  soft-tissue]
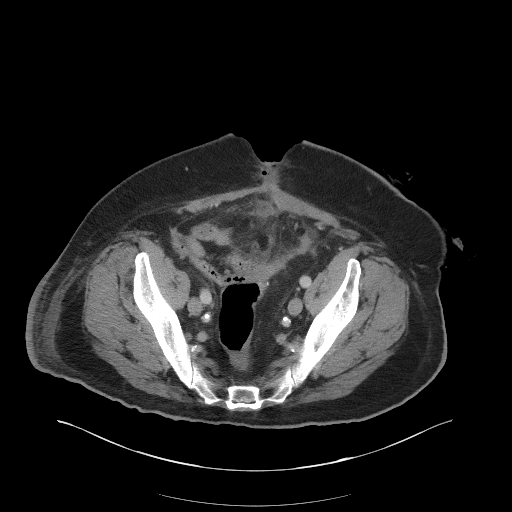
[im 39/109  soft-tissue]
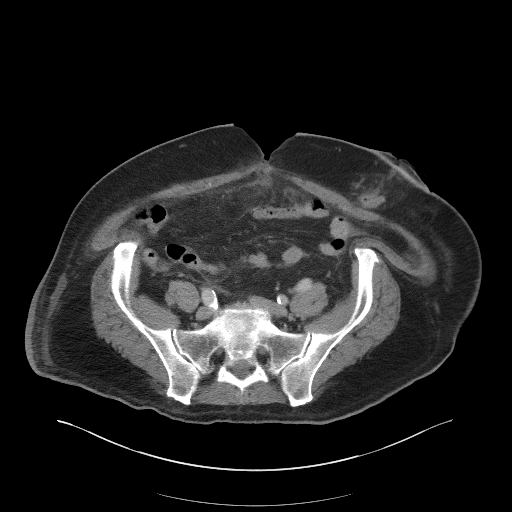
[im 47/109  soft-tissue]
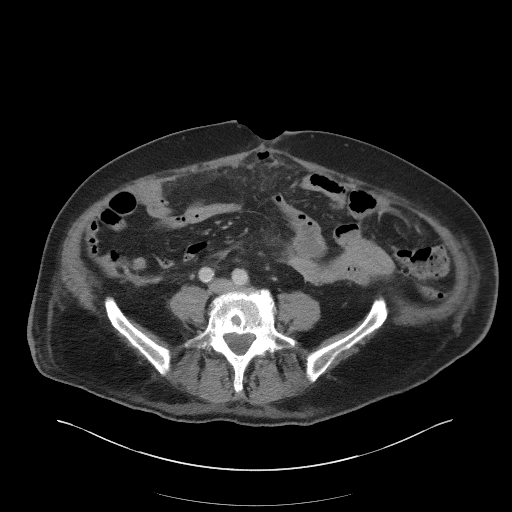
[im 55/109  soft-tissue]
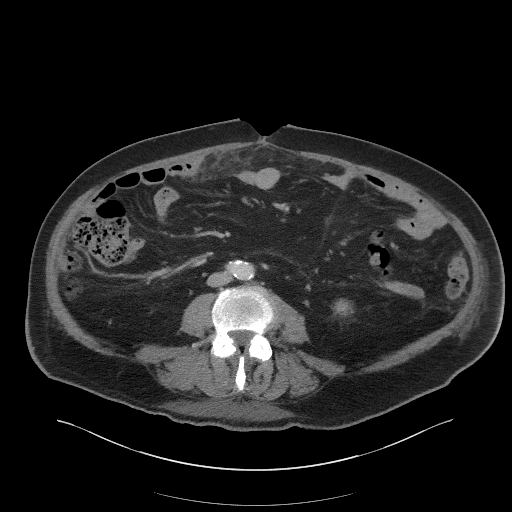
[im 62/109  soft-tissue]
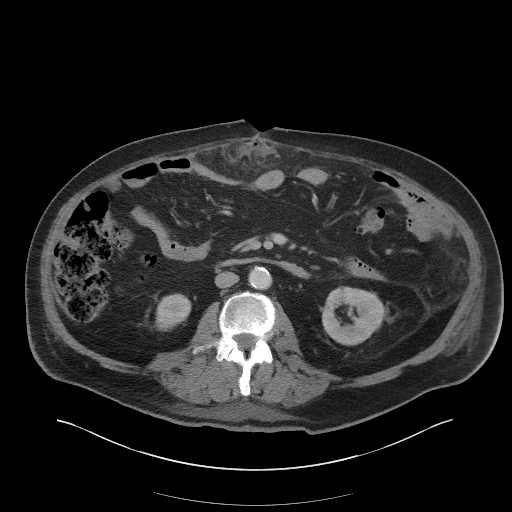
[im 70/109  soft-tissue]
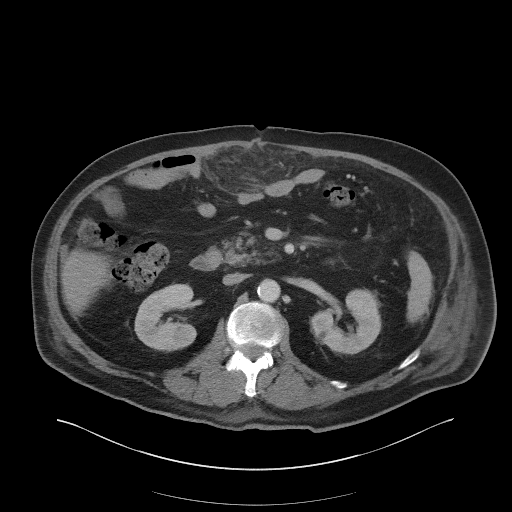
[im 70/109  bone]
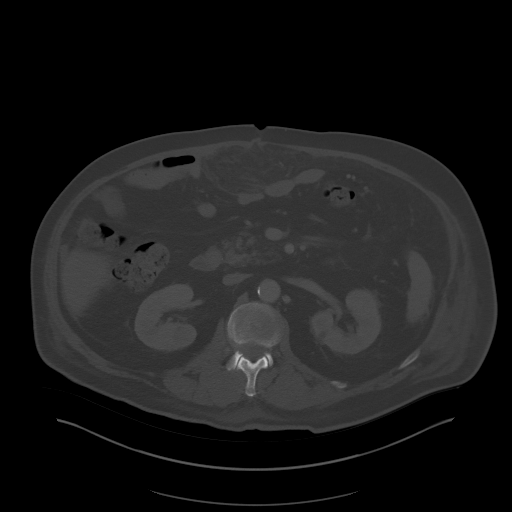
[im 78/109  soft-tissue]
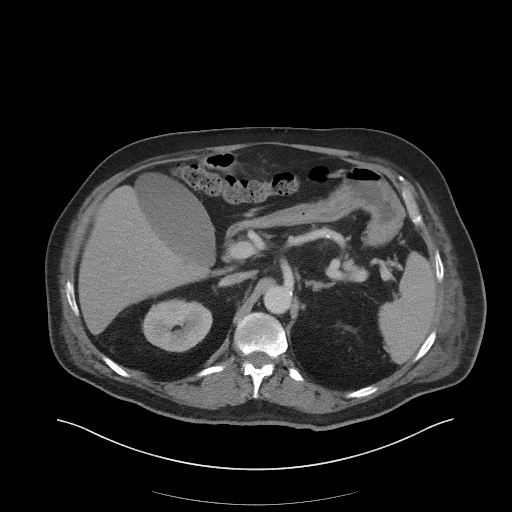
[im 85/109  soft-tissue]
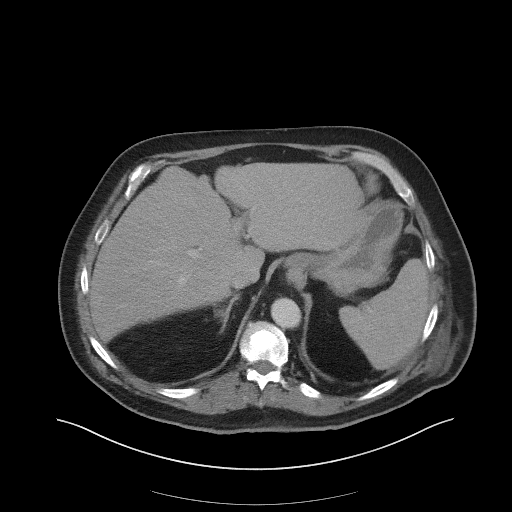
[im 93/109  soft-tissue]
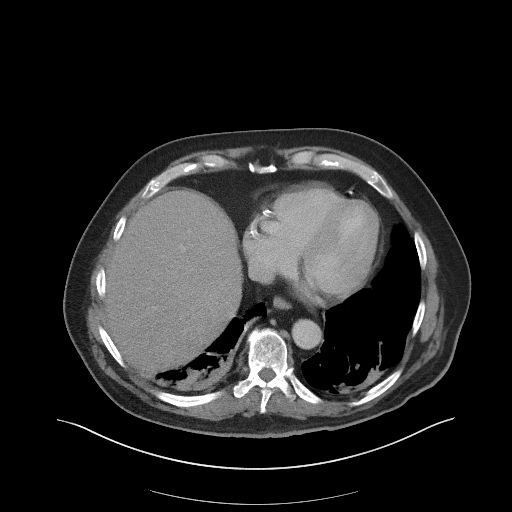
[im 101/109  soft-tissue]
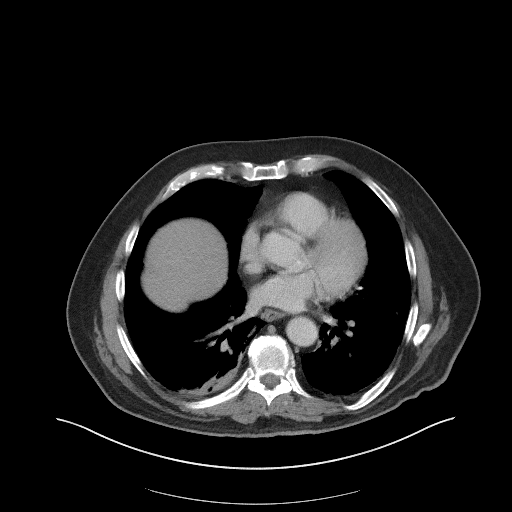

[Series 5: coronal st · coronal · 0.91mm/px · 3 of 105 slices shown]
[im 35/105  soft-tissue]
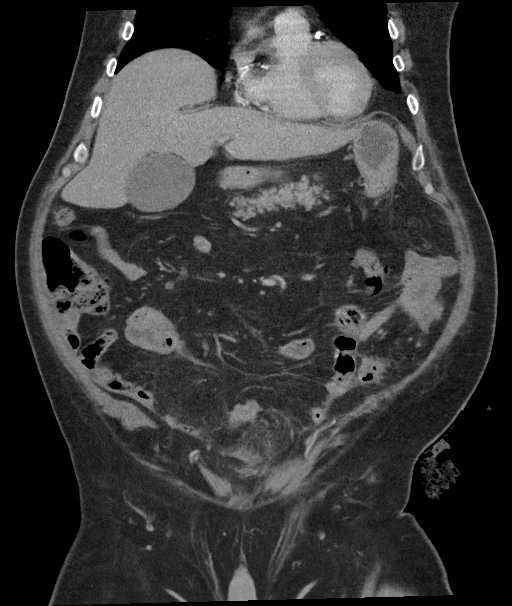
[im 47/105  soft-tissue]
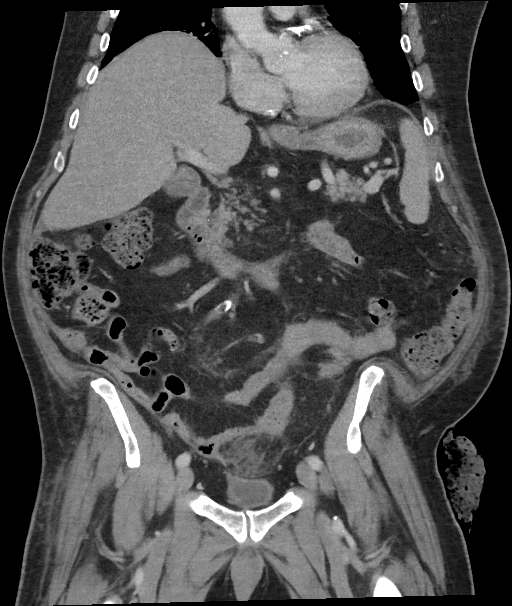
[im 58/105  soft-tissue]
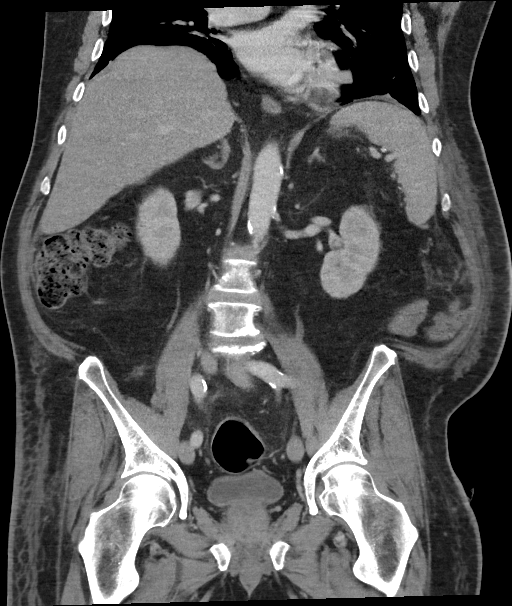

[16 of 46 positions shown; findings below may reference images not displayed]

FINDINGS: Lower chest: Atelectasis of bilateral lung bases are identified.
There are minimal bilateral pleural effusions.

Hepatobiliary: There is diffuse low density of the liver without
vessel displacement. No focal liver lesion is identified. The
gallbladder is distended. The biliary tree is normal.

Pancreas: Unremarkable. No pancreatic ductal dilatation or
surrounding inflammatory changes.

Spleen: Normal in size without focal abnormality.

Adrenals/Urinary Tract: The bilateral adrenal glands are normal.
Small cysts identified in both kidneys. There is no hydronephrosis
bilaterally. The bladder is decompressed limiting evaluation.

Stomach/Bowel: Patient status post sigmoid colon resection with left
lower quadrant ostomy. There are small foci of free air along the
midline ventral abdominal wound with mesenteric stranding. There is
a small fluid collection along the inferior internal margin of the
wound as well as a small fluid collection along the superior
internal margin of the wound suspicious for abscesses unchanged.
There is no small bowel obstruction. The stomach is normal.

Vascular/Lymphatic: Aortic atherosclerosis. No enlarged abdominal or
pelvic lymph nodes.

Reproductive: Prostate calcifications are identified.

Other: None.

Musculoskeletal: Degenerative joint changes of the spine are noted.
No acute abnormalities noted.
IMPRESSION: Postoperative changes of sigmoid colon resection and left lower
quadrant ostomy.

There are small air loculations within midline ventral abdominal
wound not changed. There are small fluid collections along the
superior and inferior internal margin of the wound suspicious for
abscesses, unchanged.

## 2019-03-28 MED ORDER — VANCOMYCIN HCL IN DEXTROSE 1-5 GM/200ML-% IV SOLN
1000.0000 mg | Freq: Once | INTRAVENOUS | Status: AC
  Administered 2019-03-28: 1000 mg via INTRAVENOUS
  Filled 2019-03-28: qty 200

## 2019-03-28 MED ORDER — ACETAMINOPHEN 650 MG RE SUPP: 650.0000 mg | Freq: Four times a day (QID) | RECTAL | Status: DC | PRN

## 2019-03-28 MED ORDER — ONDANSETRON HCL 4 MG/2ML IJ SOLN
4.0000 mg | Freq: Four times a day (QID) | INTRAMUSCULAR | Status: DC | PRN
  Administered 2019-03-28: 4 mg via INTRAVENOUS
  Filled 2019-03-28: qty 2

## 2019-03-28 MED ORDER — DUTASTERIDE 0.5 MG PO CAPS
0.5000 mg | ORAL_CAPSULE | Freq: Every day | ORAL | Status: DC
  Administered 2019-03-29 – 2019-03-31 (×3): 0.5 mg via ORAL
  Filled 2019-03-28 (×4): qty 1

## 2019-03-28 MED ORDER — LORATADINE 10 MG PO TABS
10.0000 mg | ORAL_TABLET | Freq: Every day | ORAL | Status: DC
  Administered 2019-03-28 – 2019-03-31 (×4): 10 mg via ORAL
  Filled 2019-03-28 (×4): qty 1

## 2019-03-28 MED ORDER — PANTOPRAZOLE SODIUM 40 MG PO TBEC
40.0000 mg | DELAYED_RELEASE_TABLET | Freq: Every day | ORAL | Status: DC
  Administered 2019-03-28 – 2019-03-31 (×4): 40 mg via ORAL
  Filled 2019-03-28 (×4): qty 1

## 2019-03-28 MED ORDER — PIPERACILLIN-TAZOBACTAM 3.375 G IVPB
3.3750 g | Freq: Three times a day (TID) | INTRAVENOUS | Status: DC
  Administered 2019-03-28 – 2019-03-31 (×9): 3.375 g via INTRAVENOUS
  Filled 2019-03-28 (×9): qty 50

## 2019-03-28 MED ORDER — MORPHINE SULFATE (PF) 2 MG/ML IV SOLN
2.0000 mg | INTRAVENOUS | Status: DC | PRN
  Administered 2019-03-28: 2 mg via INTRAVENOUS
  Filled 2019-03-28: qty 1

## 2019-03-28 MED ORDER — ENOXAPARIN SODIUM 40 MG/0.4ML ~~LOC~~ SOLN
40.0000 mg | SUBCUTANEOUS | Status: DC
  Administered 2019-03-28 – 2019-03-30 (×3): 40 mg via SUBCUTANEOUS
  Filled 2019-03-28 (×3): qty 0.4

## 2019-03-28 MED ORDER — FLUTICASONE PROPIONATE 50 MCG/ACT NA SUSP
1.0000 | Freq: Every day | NASAL | Status: DC | PRN
  Administered 2019-03-29 – 2019-03-30 (×2): 1 via NASAL
  Filled 2019-03-28 (×2): qty 16

## 2019-03-28 MED ORDER — PILOCARPINE HCL 5 MG PO TABS
5.0000 mg | ORAL_TABLET | Freq: Two times a day (BID) | ORAL | Status: DC
  Administered 2019-03-29 – 2019-03-31 (×5): 5 mg via ORAL
  Filled 2019-03-28 (×5): qty 1

## 2019-03-28 MED ORDER — TRIAMTERENE-HCTZ 37.5-25 MG PO CAPS
1.0000 | ORAL_CAPSULE | Freq: Every day | ORAL | Status: DC
  Filled 2019-03-28 (×2): qty 1

## 2019-03-28 MED ORDER — IOHEXOL 300 MG/ML  SOLN
100.0000 mL | Freq: Once | INTRAMUSCULAR | Status: AC | PRN
  Administered 2019-03-28: 12:00:00 100 mL via INTRAVENOUS

## 2019-03-28 MED ORDER — ACETAMINOPHEN 325 MG PO TABS: 650.0000 mg | ORAL_TABLET | Freq: Four times a day (QID) | ORAL | Status: DC | PRN

## 2019-03-28 MED ORDER — VANCOMYCIN HCL 10 G IV SOLR
1500.0000 mg | Freq: Once | INTRAVENOUS | Status: AC
  Administered 2019-03-28: 1500 mg via INTRAVENOUS
  Filled 2019-03-28: qty 1500

## 2019-03-28 MED ORDER — PIPERACILLIN-TAZOBACTAM 3.375 G IVPB 30 MIN
3.3750 g | Freq: Once | INTRAVENOUS | Status: AC
  Administered 2019-03-28: 3.375 g via INTRAVENOUS
  Filled 2019-03-28: qty 50

## 2019-03-28 MED ORDER — TROSPIUM CHLORIDE ER 60 MG PO CP24: 1.0000 | ORAL_CAPSULE | Freq: Every day | ORAL | Status: DC

## 2019-03-28 MED ORDER — HYDROCODONE-ACETAMINOPHEN 5-325 MG PO TABS
1.0000 | ORAL_TABLET | ORAL | Status: DC | PRN
  Administered 2019-03-30: 2 via ORAL
  Filled 2019-03-28: qty 2

## 2019-03-28 MED ORDER — AMLODIPINE BESYLATE 10 MG PO TABS
10.0000 mg | ORAL_TABLET | Freq: Every day | ORAL | Status: DC
  Administered 2019-03-28 – 2019-03-31 (×4): 10 mg via ORAL
  Filled 2019-03-28 (×4): qty 1

## 2019-03-28 MED ORDER — ONDANSETRON 4 MG PO TBDP: 4.0000 mg | ORAL_TABLET | Freq: Four times a day (QID) | ORAL | Status: DC | PRN

## 2019-03-28 NOTE — ED Notes (Signed)
Pt wife screened and sent to pt room

## 2019-03-28 NOTE — Consult Note (Signed)
PHARMACY -  BRIEF ANTIBIOTIC NOTE   Pharmacy has received consult(s) for wound infection from an ED provider.  The patient's profile has been reviewed for ht/wt/allergies/indication/available labs.    One time order(s) placed for pip/tazo and vancomycin   Further antibiotics/pharmacy consults should be ordered by admitting physician if indicated.                       Thank you, Oswald Hillock, PharmD, BCPS 03/28/2019  9:54 AM

## 2019-03-28 NOTE — ED Triage Notes (Signed)
Pt to ED by EMS with post surgical wound that family states had increased drainage and blood pooling at bottom of incision site. Pt reports fever at home of 100.2.

## 2019-03-28 NOTE — H&P (Addendum)
SURGICAL HISTORY AND PHYSICAL NOTE   HISTORY OF PRESENT ILLNESS (HPI):  73 y.o. male presented to [facility] ED for evaluation of fever and draining per wound. Patient reports that since he was discharged he has been draining "pus". He was recently admitted due to perforated diverticulitis and had Hartmann's procedure. Patient has open wound due to contaminated case. He was discharged with Home Health. He called home health today due to wound drainage and they recommended to come to ED. This is the second visit to ED since discharge for the same complain. Patient also report that EMS took temp at home today and was 100.6 F. Denies nausea and vomiting. Report tolerating diet. Able to ambulate with walker. Denies abdominal pain. No pain radiation. No alleviating or aggravating factors.   Surgery is consulted by Dr. Joni Fears in this context for evaluation and management of wound infection.  PAST MEDICAL HISTORY (PMH):  Past Medical History:  Diagnosis Date  . Arthritis   . BPH associated with nocturia   . ED (erectile dysfunction)   . Erectile dysfunction   . Hyperlipemia   . Hypertension   . Male hypogonadism   . Sleep apnea   . Urinary frequency   . Urinary urgency      PAST SURGICAL HISTORY [facility]):  Past Surgical History:  Procedure Laterality Date  . EYE SURGERY Bilateral 2002   laser  . INGUINAL HERNIA REPAIR Left   . KNEE SURGERY Right 1995  . LAPAROTOMY N/A 03/15/2019   Procedure: EXPLORATORY LAPAROTOMY,sigmoid colectomy,colostomy;  Surgeon: Jules Husbands, MD;  Location: [facility] ORS;  Service: General;  Laterality: N/A;     MEDICATIONS:  Prior to Admission medications   Medication Sig Start Date End Date Taking? Authorizing Provider  amLODipine (NORVASC) 10 MG tablet Take 10 mg by mouth daily.  03/18/15  Yes [provider]  aspirin 81 MG tablet Take 81 mg by mouth daily.   Yes [provider]  cefdinir (OMNICEF) 300 MG capsule Take 1 capsule (300 mg total) by mouth  2 (two) times daily for 7 days. 03/25/19 04/01/19 Yes Vanessa Ali Chuk, MD  cetirizine (ZYRTEC) 10 MG tablet Take 10 mg by mouth daily.   Yes [provider]  dutasteride (AVODART) 0.5 MG capsule Take 1 capsule (0.5 mg total) by mouth daily. 03/17/18  Yes McGowan, Larene Beach A, PA-C  Glucosamine-Chondroit-Vit C-Mn (GLUCOSAMINE 1500 COMPLEX PO) Take by mouth.   Yes [provider]  metroNIDAZOLE (FLAGYL) 250 MG tablet Take 2 tablets (500 mg total) by mouth 2 (two) times daily for 7 days. 03/25/19 04/01/19 Yes Vanessa [place], MD  Multiple Vitamins-Minerals (PRESERVISION AREDS 2 PO) Take by mouth.   Yes [provider]  omeprazole (PRILOSEC) 40 MG capsule Take 40 mg by mouth daily. 01/22/18  Yes [provider]  oxyCODONE (OXY IR/ROXICODONE) 5 MG immediate release tablet Take 1 tablet (5 mg total) by mouth every 6 (six) hours as needed for severe pain. 03/23/19  Yes Tylene Fantasia, PA-C  pilocarpine (SALAGEN) 5 MG tablet Take 5 mg by mouth 2 (two) times daily.  03/18/15  Yes [provider]  simvastatin (ZOCOR) 20 MG tablet Take 20 mg by mouth daily at 6 PM.  03/18/15  Yes [provider]  triamterene-hydrochlorothiazide (DYAZIDE) 37.5-25 MG per capsule Take 1 capsule by mouth daily.  03/18/15  Yes [provider]  Trospium Chloride 60 MG CP24 Take 1 capsule (60 mg total) by mouth daily. 06/04/16  Yes McGowan, Hunt Oris, PA-C  augmented betamethasone dipropionate (DIPROLENE-AF) 0.05 % cream Apply topically. 11/27/16   [provider]  EPIPEN 2-PAK 0.3 MG/0.3ML SOAJ injection  05/03/15   [provider]  fluticasone Asencion Islam) 50 MCG/ACT nasal spray  08/27/16   [provider]  loratadine-pseudoephedrine (CLARITIN-D 24-HOUR) 10-240 MG 24 hr tablet Take 1 tablet by mouth daily.    [provider]  sildenafil (REVATIO) 20 MG tablet Take 3 to 5 tablets two hours before intercouse on an empty stomach.  Do not take with nitrates.  06/04/16   Zara Council A, PA-C  tadalafil (CIALIS) 20 MG tablet Take 1 tablet (20 mg total) by mouth daily as needed for erectile dysfunction. Patient not taking: Reported on 03/28/2019 03/22/15   Hollice Espy, MD  triamcinolone cream (KENALOG) 0.5 % APPLY TOPICALLY TWO TIMES DAILY FOR UP TO 7 TO 10 DAYS. 03/13/18   [provider]     ALLERGIES:  No Known Allergies   SOCIAL HISTORY:  Social History   Socioeconomic History  . Marital status: Married    Spouse name: Not on file  . Number of children: Not on file  . Years of education: Not on file  . Highest education level: Not on file  Occupational History  . Not on file  Social Needs  . Financial resource strain: Not on file  . Food insecurity    Worry: Not on file    Inability: Not on file  . Transportation needs    Medical: Not on file    Non-medical: Not on file  Tobacco Use  . Smoking status: Former Research scientist (life sciences)  . Smokeless tobacco: Never Used  . Tobacco comment: quit 50 years ago  Substance and Sexual Activity  . Alcohol use: No    Alcohol/week: 0.0 standard drinks  . Drug use: No  . Sexual activity: Yes  Lifestyle  . Physical activity    Days per week: Not on file    Minutes per session: Not on file  . Stress: Not on file  Relationships  . Social Herbalist on phone: Not on file    Gets together: Not on file    Attends religious service: Not on file    Active member of club or organization: Not on file    Attends meetings of clubs or organizations: Not on file    Relationship status: Not on file  . Intimate partner violence    Fear of current or ex partner: Not on file    Emotionally abused: Not on file    Physically abused: Not on file    Forced sexual activity: Not on file  Other Topics Concern  . Not on file  Social History Narrative  . Not on file    The patient currently resides (home / rehab facility / nursing home): Home The patient normally is (ambulatory / bedbound):  Ambulatory   FAMILY HISTORY:  Family History  Problem Relation Age of Onset  . Hypertension Other   . Prostate cancer Neg Hx   . Bladder Cancer Neg Hx   . Kidney cancer Neg Hx      REVIEW OF SYSTEMS:  Constitutional: denies weight loss, chills, or sweats. Positive for fever.  Eyes: denies any other vision changes, history of eye injury  ENT: denies sore throat, hearing problems  Respiratory: denies shortness of breath, wheezing  Cardiovascular: denies chest pain, palpitations  Gastrointestinal: denies abdominal pain, N/V, or diarrhea Genitourinary: denies burning with urination or urinary frequency Musculoskeletal: denies any  other joint pains or cramps  Skin: denies any other rashes or skin discolorations  Neurological: denies any other headache, dizziness, weakness  Psychiatric: denies any other depression, anxiety   All other review of systems were negative   VITAL SIGNS:  Temp:  [98 F (36.7 C)-99.3 F (37.4 C)] 98 F (36.7 C) (08/01 1347) Pulse Rate:  [69-82] 82 (08/01 1513) Resp:  [18] 18 (08/01 1513) BP: (114-138)/(59-72) 138/70 (08/01 1513) SpO2:  [91 %-96 %] 92 % (08/01 1513) Weight:  [113.4 kg] 113.4 kg (08/01 1543)     Height: 6\' 1"  (185.4 cm) Weight: 113.4 kg BMI (Calculated): 32.99   INTAKE/OUTPUT:  This shift: No intake/output data recorded.  Last 2 shifts: @IOLAST2SHIFTS @   PHYSICAL EXAM:  Constitutional:  -- Normal body habitus  -- Awake, alert, and oriented x3  Eyes:  -- Pupils equally round and reactive to light  -- No scleral icterus  Ear, nose, and throat:  -- No jugular venous distension  Pulmonary:  -- No crackles  -- Equal breath sounds bilaterally -- Breathing non-labored at rest Cardiovascular:  -- S1, S2 present  -- No pericardial rubs Gastrointestinal:  -- Abdomen soft, nontender, non-distended, no guarding or rebound tenderness -- No abdominal masses appreciated, pulsatile or otherwise  Musculoskeletal and Integumentary:  --  Wounds or skin discoloration: None appreciated -- Extremities: B/L UE and LE FROM, hands and feet warm, no edema  Neurologic:  -- Motor function: intact and symmetric -- Sensation: intact and symmetric    After local care:     Labs:  CBC Latest Ref Rng & Units 03/28/2019 03/25/2019 03/19/2019  WBC 4.0 - 10.5 K/uL 8.9 9.1 7.0  Hemoglobin 13.0 - 17.0 g/dL 12.7(L) 13.4 13.3  Hematocrit 39.0 - 52.0 % 38.4(L) 39.6 39.5  Platelets 150 - 400 K/uL 620(H) 615(H) 113(L)   CMP Latest Ref Rng & Units 03/28/2019 03/25/2019 03/21/2019  Glucose 70 - 99 mg/dL 153(H) 139(H) -  BUN 8 - 23 mg/dL 16 24(H) -  Creatinine 0.61 - 1.24 mg/dL 0.89 0.89 -  Sodium 135 - 145 mmol/L 134(L) 132(L) -  Potassium 3.5 - 5.1 mmol/L 3.7 3.6 3.8  Chloride 98 - 111 mmol/L 100 99 -  CO2 22 - 32 mmol/L 24 22 -  Calcium 8.9 - 10.3 mg/dL 8.2(L) 8.2(L) -  Total Protein 6.5 - 8.1 g/dL - 6.2(L) -  Total Bilirubin 0.3 - 1.2 mg/dL - 1.3(H) -  Alkaline Phos 38 - 126 U/L - 87 -  AST 15 - 41 U/L - 39 -  ALT 0 - 44 U/L - 60(H) -   Imaging studies: I personally evaluated the images. There is no intra abdominal collection or intra abdominal pathology. There is subcutaneous fluid that is draining to atmosphere.   CLINICAL DATA:  Status post colectomy for diverticulitis March 15, 2019. Acute abdomen pain, fever.  EXAM: CT ABDOMEN AND PELVIS WITH CONTRAST  TECHNIQUE: Multidetector CT imaging of the abdomen and pelvis was performed using the standard protocol following bolus administration of intravenous contrast.  CONTRAST:  152mL OMNIPAQUE IOHEXOL 300 MG/ML  SOLN  COMPARISON:  March 25, 2019  FINDINGS: Lower chest: Atelectasis of bilateral lung bases are identified. There are minimal bilateral pleural effusions.  Hepatobiliary: There is diffuse low density of the liver without vessel displacement. No focal liver lesion is identified. The gallbladder is distended. The biliary tree is normal.  Pancreas: Unremarkable.  No pancreatic ductal dilatation or surrounding inflammatory changes.  Spleen: Normal in size without focal abnormality.  Adrenals/Urinary Tract: The bilateral adrenal glands are normal. Small cysts identified in both kidneys. There is no hydronephrosis bilaterally. The bladder is decompressed limiting evaluation.  Stomach/Bowel: Patient status post sigmoid colon resection with left lower quadrant ostomy. There are small foci of free air along the midline ventral abdominal wound with mesenteric stranding. There is a small fluid collection along the inferior internal margin of the wound as well as a small fluid collection along the superior internal margin of the wound suspicious for abscesses unchanged. There is no small bowel obstruction. The stomach is normal.  Vascular/Lymphatic: Aortic atherosclerosis. No enlarged abdominal or pelvic lymph nodes.  Reproductive: Prostate calcifications are identified.  Other: None.  Musculoskeletal: Degenerative joint changes of the spine are noted. No acute abnormalities noted.  IMPRESSION: Postoperative changes of sigmoid colon resection and left lower quadrant ostomy.  There are small air loculations within midline ventral abdominal wound not changed. There are small fluid collections along the superior and inferior internal margin of the wound suspicious for abscesses, unchanged.   Electronically Signed   By: Abelardo Diesel M.D.   On: 03/28/2019 12:50  Assessment/Plan:  73 y.o. male with wound infection after Hartmann's procedure.  Patient with expected wound infection after contaminated case. The wound is open and draining properly. This is a very complex wound due to size and is creating a bridge with undermining. I gave local care and there is no identifiable contained fluid collection, The fluid seen on CT scan seem to be draining though the wound. Since patient had a fever episode at home as per EMS, and this is the  second visit to ED with same complain of wound drainage, I admitted the patient for IV antibiotic therapy, aggressive local care. I discussed with the patient that an alternative would be to consider a Wound VAC system that will help to control drainage and will also promote wound healing. At this moment the patient is not septic and no sign of necrotic tissue or deeper infection that merit urgent surgical intervention. Will give local care daily, will follow up with vital signs and signs of fever and discuss with him longer term wound care plan.   This was a more than 60 minute encounter, most of the time counseling the patient and coordinating plan of care. Wife, the son and the daughter in law were also updated of patients status by phone.   Arnold Long, MD

## 2019-03-28 NOTE — ED Notes (Signed)
ED TO INPATIENT HANDOFF REPORT  ED Nurse Name and Phone #:  Maudie Mercury [phone]  S Name/Age/Gender Tyrone Luna 73 y.o. male Room/Bed: ED26A/ED26A  Code Status   Code Status: Full Code  Home/SNF/Other Home Patient oriented to: self, place, time and situation Is this baseline? Yes   Triage Complete: Triage complete  Chief Complaint Post-op problem  Triage Note Pt to ED by EMS with post surgical wound that family states had increased drainage and blood pooling at bottom of incision site. Pt reports fever at home of 100.2.    Allergies No Known Allergies  Level of Care/Admitting Diagnosis ED Disposition    ED Disposition Condition [facility] Area: [place] [[id number]]  Level of Care: Med-Surg [16]  Covid Evaluation: Asymptomatic Screening Protocol (No Symptoms)  Diagnosis: Wound infection [191478]  Admitting Physician: Herbert Pun [[id number]]  Attending Physician: Herbert Pun [[id number]]  Estimated length of stay: past midnight tomorrow  Certification:: I certify this patient will need inpatient services for at least 2 midnights  PT Class (Do Not Modify): Inpatient [101]  PT Acc Code (Do Not Modify): Private [1]       B Medical/Surgery History Past Medical History:  Diagnosis Date  . Arthritis   . BPH associated with nocturia   . ED (erectile dysfunction)   . Erectile dysfunction   . Hyperlipemia   . Hypertension   . Male hypogonadism   . Sleep apnea   . Urinary frequency   . Urinary urgency    Past Surgical History:  Procedure Laterality Date  . EYE SURGERY Bilateral 2002   laser  . INGUINAL HERNIA REPAIR Left   . KNEE SURGERY Right 1995  . LAPAROTOMY N/A 03/15/2019   Procedure: EXPLORATORY LAPAROTOMY,sigmoid colectomy,colostomy;  Surgeon: Jules Husbands, MD;  Location: [facility] ORS;  Service: General;  Laterality: N/A;     A IV Location/Drains/Wounds Patient Lines/Drains/Airways Status   Active  Line/Drains/Airways    Name:   Placement date:   Placement time:   Site:   Days:   Peripheral IV 03/28/19 Left Hand   03/28/19    0930    Hand   less than 1   Peripheral IV 03/28/19 Right;Anterior Forearm   03/28/19    1030    Forearm   less than 1   Colostomy LUQ   03/15/19    1559    LUQ   13   Incision (Closed) 03/16/19 Abdomen Medial;Upper   03/16/19    1930     12          Intake/Output Last 24 hours No intake or output data in the 24 hours ending 03/28/19 1448  Labs/Imaging Results for orders placed or performed during the hospital encounter of 03/28/19 (from the past 48 hour(s))  Basic metabolic panel     Status: Abnormal   Collection Time: 03/28/19 11:04 AM  Result Value Ref Range   Sodium 134 (L) 135 - 145 mmol/L   Potassium 3.7 3.5 - 5.1 mmol/L   Chloride 100 98 - 111 mmol/L   CO2 24 22 - 32 mmol/L   Glucose, Bld 153 (H) 70 - 99 mg/dL   BUN 16 8 - 23 mg/dL   Creatinine, Ser 0.89 0.61 - 1.24 mg/dL   Calcium 8.2 (L) 8.9 - 10.3 mg/dL   GFR calc non Af Amer >60 >60 mL/min   GFR calc Af Amer >60 >60 mL/min   Anion gap 10 5 - 15  Comment: Performed at [facility], [address] [place], [place] [address]  CBC with Differential     Status: Abnormal   Collection Time: 03/28/19 11:04 AM  Result Value Ref Range   WBC 8.9 4.0 - 10.5 K/uL   RBC 4.26 4.22 - 5.81 MIL/uL   Hemoglobin 12.7 (L) 13.0 - 17.0 g/dL   HCT 38.4 (L) 39.0 - 52.0 %   MCV 90.1 80.0 - 100.0 fL   MCH 29.8 26.0 - 34.0 pg   MCHC 33.1 30.0 - 36.0 g/dL   RDW 13.6 11.5 - 15.5 %   Platelets 620 (H) 150 - 400 K/uL   nRBC 0.5 (H) 0.0 - 0.2 %   Neutrophils Relative % 56 %   Neutro Abs 5.0 1.7 - 7.7 K/uL   Lymphocytes Relative 11 %   Lymphs Abs 1.0 0.7 - 4.0 K/uL   Monocytes Relative 9 %   Monocytes Absolute 0.8 0.1 - 1.0 K/uL   Eosinophils Relative 3 %   Eosinophils Absolute 0.3 0.0 - 0.5 K/uL   Basophils Relative 0 %   Basophils Absolute 0.0 0.0 - 0.1 K/uL   WBC Morphology      MODERATE  LEFT SHIFT (>5% METAS AND MYELOS,OCC PRO NOTED)   Smear Review MORPHOLOGY UNREMARKABLE    Immature Granulocytes 21 %   Abs Immature Granulocytes 1.83 (H) 0.00 - 0.07 K/uL   Polychromasia PRESENT     Comment: Performed at [facility], [address], [place], [place] [address]  SARS Coronavirus 2 [facility] order, Performed in [place] hospital lab)     Status: None   Collection Time: 03/28/19 11:48 AM  Result Value Ref Range   SARS Coronavirus 2 NEGATIVE NEGATIVE    Comment: (NOTE) If result is NEGATIVE SARS-CoV-2 target nucleic acids are NOT DETECTED. The SARS-CoV-2 RNA is generally detectable in upper and lower  respiratory specimens during the acute phase of infection. The lowest  concentration of SARS-CoV-2 viral copies this assay can detect is 250  copies / mL. A negative result does not preclude SARS-CoV-2 infection  and should not be used as the sole basis for treatment or other  patient management decisions.  A negative result may occur with  improper specimen collection / handling, submission of specimen other  than nasopharyngeal swab, presence of viral mutation(s) within the  areas targeted by this assay, and inadequate number of viral copies  (<250 copies / mL). A negative result must be combined with clinical  observations, patient history, and epidemiological information. If result is POSITIVE SARS-CoV-2 target nucleic acids are DETECTED. The SARS-CoV-2 RNA is generally detectable in upper and lower  respiratory specimens dur ing the acute phase of infection.  Positive  results are indicative of active infection with SARS-CoV-2.  Clinical  correlation with patient history and other diagnostic information is  necessary to determine patient infection status.  Positive results do  not rule out bacterial infection or co-infection with other viruses. If result is PRESUMPTIVE POSTIVE SARS-CoV-2 nucleic acids MAY BE PRESENT.   A presumptive positive result was  obtained on the submitted specimen  and confirmed on repeat testing.  While 2019 novel coronavirus  (SARS-CoV-2) nucleic acids may be present in the submitted sample  additional confirmatory testing may be necessary for epidemiological  and / or clinical management purposes  to differentiate between  SARS-CoV-2 and other Sarbecovirus currently known to infect humans.  If clinically indicated additional testing with an alternate test  methodology [phone]) is advised.  The SARS-CoV-2 RNA is generally  detectable in upper and lower respiratory sp ecimens during the acute  phase of infection. The expected result is Negative. Fact Sheet for Patients:  StrictlyIdeas.no Fact Sheet for Healthcare Providers: BankingDealers.co.za This test is not yet approved or cleared by the [place] FDA and has been authorized for detection and/or diagnosis of SARS-CoV-2 by FDA under an Emergency Use Authorization (EUA).  This EUA will remain in effect (meaning this test can be used) for the duration of the COVID-19 declaration under Section 564(b)(1) of the Act, 21 U.S.C. section 360bbb-3(b)(1), unless the authorization is terminated or revoked sooner. Performed at [facility], [address] [place],  [address]    Ct Abdomen Pelvis W Contrast  Result Date: 03/28/2019 CLINICAL DATA:  Status post colectomy for diverticulitis March 15, 2019. Acute abdomen pain, fever. EXAM: CT ABDOMEN AND PELVIS WITH CONTRAST TECHNIQUE: Multidetector CT imaging of the abdomen and pelvis was performed using the standard protocol following bolus administration of intravenous contrast. CONTRAST:  12mL OMNIPAQUE IOHEXOL 300 MG/ML  SOLN COMPARISON:  March 25, 2019 FINDINGS: Lower chest: Atelectasis of bilateral lung bases are identified. There are minimal bilateral pleural effusions. Hepatobiliary: There is diffuse low density of the liver without vessel displacement.  No focal liver lesion is identified. The gallbladder is distended. The biliary tree is normal. Pancreas: Unremarkable. No pancreatic ductal dilatation or surrounding inflammatory changes. Spleen: Normal in size without focal abnormality. Adrenals/Urinary Tract: The bilateral adrenal glands are normal. Small cysts identified in both kidneys. There is no hydronephrosis bilaterally. The bladder is decompressed limiting evaluation. Stomach/Bowel: Patient status post sigmoid colon resection with left lower quadrant ostomy. There are small foci of free air along the midline ventral abdominal wound with mesenteric stranding. There is a small fluid collection along the inferior internal margin of the wound as well as a small fluid collection along the superior internal margin of the wound suspicious for abscesses unchanged. There is no small bowel obstruction. The stomach is normal. Vascular/Lymphatic: Aortic atherosclerosis. No enlarged abdominal or pelvic lymph nodes. Reproductive: Prostate calcifications are identified. Other: None. Musculoskeletal: Degenerative joint changes of the spine are noted. No acute abnormalities noted. IMPRESSION: Postoperative changes of sigmoid colon resection and left lower quadrant ostomy. There are small air loculations within midline ventral abdominal wound not changed. There are small fluid collections along the superior and inferior internal margin of the wound suspicious for abscesses, unchanged. Electronically Signed   By: Abelardo Diesel M.D.   On: 03/28/2019 12:50    Pending Labs Unresulted Labs (From admission, onward)    Start     Ordered   03/28/19 1040  Wound or Superficial Culture  Once,   STAT     03/28/19 1039   03/28/19 0945  Culture, blood (routine x 2)  BLOOD CULTURE X 2,   STAT     03/28/19 0944          Vitals/Pain Today's Vitals   03/28/19 0923 03/28/19 0928 03/28/19 1145 03/28/19 1347  BP:  (!) 114/59 131/71 128/67  Pulse:  78 71 78  Resp:  18 18 18    Temp:  99.3 F (37.4 C)  98 F (36.7 C)  TempSrc:  Oral  Oral  SpO2:  93% 96% 93%  Weight: 113.4 kg     PainSc: 0-No pain       Isolation Precautions No active isolations  Medications Medications  vancomycin (VANCOCIN) 1,500 mg in sodium chloride 0.9 % 500 mL IVPB (1,500  mg Intravenous New Bag/Given 03/28/19 1357)  amLODipine (NORVASC) tablet 10 mg (has no administration in time range)  triamterene-hydrochlorothiazide (DYAZIDE) 37.5-25 MG per capsule 1 capsule (has no administration in time range)  pantoprazole (PROTONIX) EC tablet 40 mg (has no administration in time range)  dutasteride (AVODART) capsule 0.5 mg (has no administration in time range)  Trospium Chloride CP24 60 mg (has no administration in time range)  loratadine (CLARITIN) tablet 10 mg (has no administration in time range)  fluticasone (FLONASE) 50 MCG/ACT nasal spray 1 spray (has no administration in time range)  enoxaparin (LOVENOX) injection 40 mg (has no administration in time range)  piperacillin-tazobactam (ZOSYN) IVPB 3.375 g (has no administration in time range)  acetaminophen (TYLENOL) tablet 650 mg (has no administration in time range)    Or  acetaminophen (TYLENOL) suppository 650 mg (has no administration in time range)  HYDROcodone-acetaminophen (NORCO/VICODIN) 5-325 MG per tablet 1-2 tablet (has no administration in time range)  morphine 2 MG/ML injection 2 mg (has no administration in time range)  ondansetron (ZOFRAN-ODT) disintegrating tablet 4 mg (has no administration in time range)    Or  ondansetron (ZOFRAN) injection 4 mg (has no administration in time range)  vancomycin (VANCOCIN) IVPB 1000 mg/200 mL premix (0 mg Intravenous Stopped 03/28/19 1346)  piperacillin-tazobactam (ZOSYN) IVPB 3.375 g (0 g Intravenous Stopped 03/28/19 1212)  iohexol (OMNIPAQUE) 300 MG/ML solution 100 mL (100 mLs Intravenous Contrast Given 03/28/19 1227)    Mobility walks with person assist Low fall risk   Focused  Assessments Antibiotic Therapy   R Recommendations: See Admitting Provider Note  Report given to:   Additional Notes:

## 2019-03-28 NOTE — ED Provider Notes (Signed)
[facility] Emergency Department Provider Note  ____________________________________________  Time seen: Approximately 11:30 AM  I have reviewed the triage vital signs and the nursing notes.   HISTORY  Chief Complaint Post-op Problem    HPI Tyrone Luna is a 73 y.o. 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  Denies pain fevers chills, but endorses night sweats.  Reports a fever at home of 100.2 degrees.    Due to the contaminated case, his surgical incision was left open with wet-to-dry directions for secondary wound healing.  He was seen in the ED 3 days ago, started on Omnicef and Flagyl due to a wound infection.   Past Medical History:  Diagnosis Date  . Arthritis   . BPH associated with nocturia   . ED (erectile dysfunction)   . Erectile dysfunction   . Hyperlipemia   . Hypertension   . Male hypogonadism   . Sleep apnea   . Urinary frequency   . Urinary urgency      Patient Active Problem List   Diagnosis Date Noted  . Wound infection 03/28/2019  . Diverticulitis large intestine 03/12/2019  . Morbid obesity due to excess calories ([place]) 05/18/2015  . BPH with obstruction/lower urinary tract symptoms 05/05/2015  . Nocturia 05/05/2015  . Erectile dysfunction of organic origin 05/05/2015  . ED (erectile dysfunction) of organic origin 03/22/2015  . Allergy to environmental factors 03/22/2015  . Hyperlipidemia, unspecified 03/22/2015  . BP (high blood pressure) 03/22/2015  . 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 Apnea, sleep 03/22/2015     Past Surgical History:  Procedure Laterality Date  . EYE SURGERY Bilateral 2002   laser  . INGUINAL HERNIA REPAIR Left   . KNEE SURGERY Right 1995  . LAPAROTOMY N/A 03/15/2019    Procedure: EXPLORATORY LAPAROTOMY,sigmoid colectomy,colostomy;  Surgeon: Jules Husbands, MD;  Location: [facility] ORS;  Service: General;  Laterality: N/A;     Prior to Admission medications   Medication Sig Start Date End Date Taking? Authorizing Provider  amLODipine (NORVASC) 10 MG tablet Take 10 mg by mouth daily.  03/18/15   [provider]  aspirin 81 MG tablet Take 81 mg by mouth daily.    [provider]  augmented betamethasone dipropionate (DIPROLENE-AF) 0.05 % cream Apply topically. 11/27/16   [provider]  cefdinir (OMNICEF) 300 MG capsule Take 1 capsule (300 mg total) by mouth 2 (two) times daily for 7 days. 03/25/19 04/01/19  Vanessa [place], MD  cetirizine (ZYRTEC) 10 MG tablet Take 10 mg by mouth daily.    [provider]  dutasteride (AVODART) 0.5 MG capsule Take 1 capsule (0.5 mg total) by mouth daily. 03/17/18   McGowan, Hunt Oris, PA-C  EPIPEN 2-PAK 0.3 MG/0.3ML SOAJ injection  05/03/15   [provider]  fluticasone Asencion Islam) 50 MCG/ACT nasal spray  08/27/16   [provider]  Glucosamine-Chondroit-Vit C-Mn (GLUCOSAMINE 1500 COMPLEX PO) Take by mouth.    [provider]  loratadine-pseudoephedrine (CLARITIN-D 24-HOUR) 10-240 MG 24 hr tablet Take 1 tablet by mouth daily.    [provider]  metroNIDAZOLE (FLAGYL) 250 MG tablet Take 2 tablets (500 mg total) by mouth 2 (two) times daily for 7 days. 03/25/19 04/01/19  Vanessa [place], MD  Multiple Vitamins-Minerals (PRESERVISION AREDS 2 PO) Take by mouth.  [provider]  omeprazole (PRILOSEC) 40 MG capsule Take 40 mg by mouth daily. 01/22/18   [provider]  oxyCODONE (OXY IR/ROXICODONE) 5 MG immediate release tablet Take 1 tablet (5 mg total) by mouth every 6 (six) hours as needed for severe pain. 03/23/19   Tylene Fantasia, PA-C  pilocarpine (SALAGEN) 5 MG tablet Take 5 mg by mouth 2 (two) times daily.  03/18/15   [provider]  sildenafil  (REVATIO) 20 MG tablet Take 3 to 5 tablets two hours before intercouse on an empty stomach.  Do not take with nitrates. 06/04/16   Zara Council A, PA-C  simvastatin (ZOCOR) 20 MG tablet Take 20 mg by mouth daily at 6 PM.  03/18/15   [provider]  tadalafil (CIALIS) 20 MG tablet Take 1 tablet (20 mg total) by mouth daily as needed for erectile dysfunction. Patient not taking: Reported on 03/28/2019 03/22/15   Hollice Espy, MD  triamcinolone cream (KENALOG) 0.5 % APPLY TOPICALLY TWO TIMES DAILY FOR UP TO 7 TO 10 DAYS. 03/13/18   [provider]  triamterene-hydrochlorothiazide (DYAZIDE) 37.5-25 MG per capsule Take 1 capsule by mouth daily.  03/18/15   [provider]  Trospium Chloride 60 MG CP24 Take 1 capsule (60 mg total) by mouth daily. 06/04/16   Zara Council A, PA-C     Allergies Patient has no known allergies.   Family History  Problem Relation Age of Onset  . Hypertension Other   . Prostate cancer Neg Hx   . Bladder Cancer Neg Hx   . Kidney cancer Neg Hx     Social History Social History   Tobacco Use  . Smoking status: Former Research scientist (life sciences)  . Smokeless tobacco: Never Used  . Tobacco comment: quit 50 years ago  Substance Use Topics  . Alcohol use: No    Alcohol/week: 0.0 standard drinks  . Drug use: No    Review of Systems  Constitutional:   No fever or chills.  ENT:   No sore throat. No rhinorrhea. Cardiovascular:   No chest pain or syncope. Respiratory:   No dyspnea or cough. Gastrointestinal:   Negative for abdominal pain, vomiting and diarrhea.  Musculoskeletal:   Negative for focal pain or swelling All other systems reviewed and are negative except as documented above in ROS and HPI.  ____________________________________________   PHYSICAL EXAM:  VITAL SIGNS: ED Triage Vitals  Enc Vitals Group     BP 03/28/19 0928 (!) 114/59     Pulse Rate 03/28/19 0928 78     Resp 03/28/19 0928 18     Temp 03/28/19 0928 99.3 F (37.4 C)      Temp Source 03/28/19 0928 Oral     SpO2 03/28/19 0928 93 %     Weight 03/28/19 0923 250 lb (113.4 kg)     Height --      Head Circumference --      Peak Flow --      Pain Score 03/28/19 0923 0     Pain Loc --      Pain Edu? --      Excl. in [place]? --     Vital signs reviewed, nursing assessments reviewed.   Constitutional:   Alert and oriented. Non-toxic appearance. Eyes:   Conjunctivae are normal. EOMI. PERRL. ENT      Head:   Normocephalic and atraumatic.      Nose:   No congestion/rhinnorhea.       Mouth/Throat:   MMM, no  pharyngeal erythema. No peritonsillar mass.       Neck:   No meningismus. Full ROM. Hematological/Lymphatic/Immunilogical:   No cervical lymphadenopathy. Cardiovascular:   RRR. Symmetric bilateral radial and DP pulses.  No murmurs. Cap refill less than 2 seconds. Respiratory:   Normal respiratory effort without tachypnea/retractions. Breath sounds are clear and equal bilaterally. No wheezes/rales/rhonchi. Gastrointestinal:   Soft and nontender. Non distended. There is no CVA tenderness.  No rebound, rigidity, or guarding.  Midline abdominal incision shows viable tissue.  The inferior 3 to 4 cm head shows some early dehiscence with brown purulent fluid expressed from the wound.  There is a small amount of clotted blood in the inferior edge of the wound which was wiped away,  no active bleeding.  Musculoskeletal:   Normal range of motion in all extremities. No joint effusions.  No lower extremity tenderness.  No edema. Neurologic:   Normal speech and language.  Motor grossly intact. No acute focal neurologic deficits are appreciated.  Skin:    Skin is warm, dry and intact. No rash noted.  No petechiae, purpura, or bullae.  Complicated surgical wound as above  ____________________________________________    LABS (pertinent positives/negatives) (all labs ordered are listed, but only abnormal results are displayed) Labs Reviewed  BASIC METABOLIC PANEL -  Abnormal; Notable for the following components:      Result Value   Sodium 134 (*)    Glucose, Bld 153 (*)    Calcium 8.2 (*)    All other components within normal limits  CBC WITH DIFFERENTIAL/PLATELET - Abnormal; Notable for the following components:   Hemoglobin 12.7 (*)    HCT 38.4 (*)    Platelets 620 (*)    nRBC 0.5 (*)    Abs Immature Granulocytes 1.83 (*)    All other components within normal limits  SARS CORONAVIRUS 2 (HOSPITAL ORDER, [place] [facility])  CULTURE, BLOOD (ROUTINE X 2)  CULTURE, BLOOD (ROUTINE X 2)  AEROBIC CULTURE (SUPERFICIAL SPECIMEN)   ____________________________________________   EKG    ____________________________________________    RADIOLOGY  Ct Abdomen Pelvis W Contrast  Result Date: 03/28/2019 CLINICAL DATA:  Status post colectomy for diverticulitis March 15, 2019. Acute abdomen pain, fever. EXAM: CT ABDOMEN AND PELVIS WITH CONTRAST TECHNIQUE: Multidetector CT imaging of the abdomen and pelvis was performed using the standard protocol following bolus administration of intravenous contrast. CONTRAST:  165mL OMNIPAQUE IOHEXOL 300 MG/ML  SOLN COMPARISON:  March 25, 2019 FINDINGS: Lower chest: Atelectasis of bilateral lung bases are identified. There are minimal bilateral pleural effusions. Hepatobiliary: There is diffuse low density of the liver without vessel displacement. No focal liver lesion is identified. The gallbladder is distended. The biliary tree is normal. Pancreas: Unremarkable. No pancreatic ductal dilatation or surrounding inflammatory changes. Spleen: Normal in size without focal abnormality. Adrenals/Urinary Tract: The bilateral adrenal glands are normal. Small cysts identified in both kidneys. There is no hydronephrosis bilaterally. The bladder is decompressed limiting evaluation. Stomach/Bowel: Patient status post sigmoid colon resection with left lower quadrant ostomy. There are small foci of free air along the midline  ventral abdominal wound with mesenteric stranding. There is a small fluid collection along the inferior internal margin of the wound as well as a small fluid collection along the superior internal margin of the wound suspicious for abscesses unchanged. There is no small bowel obstruction. The stomach is normal. Vascular/Lymphatic: Aortic atherosclerosis. No enlarged abdominal or pelvic lymph nodes. Reproductive: Prostate calcifications are identified. Other: None. Musculoskeletal: Degenerative  joint changes of the spine are noted. No acute abnormalities noted. IMPRESSION: Postoperative changes of sigmoid colon resection and left lower quadrant ostomy. There are small air loculations within midline ventral abdominal wound not changed. There are small fluid collections along the superior and inferior internal margin of the wound suspicious for abscesses, unchanged. Electronically Signed   By: Abelardo Diesel M.D.   On: 03/28/2019 12:50    ____________________________________________   PROCEDURES Procedures  ____________________________________________  DIFFERENTIAL DIAGNOSIS   Wound infection from deep space abscess, cellulitis  CLINICAL IMPRESSION / ASSESSMENT AND PLAN / ED COURSE  Medications ordered in the ED: Medications  vancomycin (VANCOCIN) 1,500 mg in sodium chloride 0.9 % 500 mL IVPB (1,500 mg Intravenous New Bag/Given 03/28/19 1357)  amLODipine (NORVASC) tablet 10 mg (has no administration in time range)  triamterene-hydrochlorothiazide (DYAZIDE) 37.5-25 MG per capsule 1 capsule (has no administration in time range)  pantoprazole (PROTONIX) EC tablet 40 mg (has no administration in time range)  dutasteride (AVODART) capsule 0.5 mg (has no administration in time range)  Trospium Chloride CP24 60 mg (has no administration in time range)  loratadine (CLARITIN) tablet 10 mg (has no administration in time range)  fluticasone (FLONASE) 50 MCG/ACT nasal spray 1 spray (has no administration in  time range)  enoxaparin (LOVENOX) injection 40 mg (has no administration in time range)  piperacillin-tazobactam (ZOSYN) IVPB 3.375 g (has no administration in time range)  acetaminophen (TYLENOL) tablet 650 mg (has no administration in time range)    Or  acetaminophen (TYLENOL) suppository 650 mg (has no administration in time range)  HYDROcodone-acetaminophen (NORCO/VICODIN) 5-325 MG per tablet 1-2 tablet (has no administration in time range)  morphine 2 MG/ML injection 2 mg (has no administration in time range)  ondansetron (ZOFRAN-ODT) disintegrating tablet 4 mg (has no administration in time range)    Or  ondansetron (ZOFRAN) injection 4 mg (has no administration in time range)  vancomycin (VANCOCIN) IVPB 1000 mg/200 mL premix (0 mg Intravenous Stopped 03/28/19 1346)  piperacillin-tazobactam (ZOSYN) IVPB 3.375 g (0 g Intravenous Stopped 03/28/19 1212)  iohexol (OMNIPAQUE) 300 MG/ML solution 100 mL (100 mLs Intravenous Contrast Given 03/28/19 1227)    Pertinent labs & imaging results that were available during my care of the patient were reviewed by me and considered in my medical decision making (see chart for details).  Tyrone Luna was evaluated in Emergency Department on 03/28/2019 for the symptoms described in the history of present illness. He was evaluated in the context of the global COVID-19 pandemic, which necessitated consideration that the patient might be at risk for infection with the SARS-CoV-2 virus that causes COVID-19. Institutional protocols and algorithms that pertain to the evaluation of patients at risk for COVID-19 are in a state of rapid change based on information released by regulatory bodies including the CDC and federal and state organizations. These policies and algorithms were followed during the patient's care in the ED.   Patient presents with small amount of bleeding from his surgical wound this morning, found to have purulent drainage despite being on  antibiotics for the past 3 to 4 days.  Worrisome for continued evolution of a deep space abscess.  Will need to repeat labs and CT scan again today.  At this point he will need to be admitted for further management.  Starting Zosyn and vancomycin for staph strep and Pseudomonas coverage.  Clinical Course as of Mar 28 1439  Sat Mar 28, 2019  1358 D/w Dr. Peyton Najjar for surgical evaluation.   [  PS]    Clinical Course User Index [PS] Carrie Mew, MD     ____________________________________________   FINAL CLINICAL IMPRESSION(S) / ED DIAGNOSES    Final diagnoses:  Wound infection after surgery  Wound dehiscence     ED Discharge Orders    None      Portions of this note were generated with dragon dictation software. Dictation errors may occur despite best attempts at proofreading.   Carrie Mew, MD 03/28/19 1440

## 2019-03-28 NOTE — Progress Notes (Signed)
Attempted IS education, RN in room changing dressing. IS left in room will re-attempt later

## 2019-03-29 DIAGNOSIS — T8149XA Infection following a procedure, other surgical site, initial encounter: Secondary | ICD-10-CM

## 2019-03-29 MED ORDER — TRIAMTERENE-HCTZ 37.5-25 MG PO TABS
1.0000 | ORAL_TABLET | Freq: Every day | ORAL | Status: DC
  Administered 2019-03-29 – 2019-03-31 (×3): 1 via ORAL
  Filled 2019-03-29 (×3): qty 1

## 2019-03-29 NOTE — Evaluation (Signed)
Physical Therapy Evaluation Patient Details Name: Tyrone Luna MRN: [id number] DOB: Sep 02, 1945 Today's Date: 03/29/2019   History of Present Illness  Tyrone Luna is a 53yoM who [facility] after continued concerns of drainage from subacute ABD wound, noted mild fever. Pt underwent Hartmann's Procedure prior admission and was here 7/16-7/27. GI believes patient to be infection free at this time. Pt has continued to have weakness at home, only seen HHPT 1x for evaluation thus far.  Clinical Impression  Pt admitted with above diagnosis. Pt currently with functional limitations due to the deficits listed below (see "PT Problem List"). Chief Strategy Officer familiar to pt from prior episode. Upon entry, pt in bed, awake and agreeable to participate, lunch has just arrived. The pt is alert and oriented x4, pleasant, conversational, and generally a good historian. MinA to EOB, MinA and bed elevation to comes to standing. AMB limited to ~11ft and pt requires 3-4 minutes to recover SOB (SpO2 WNL). Pt continues to struggle with independence with and tolerance of basic functional mobility. Last admission he AMB 3 sessions with PT 117ft max, and this date is able to go ~64ft. IT sounds as though he has been fairly sedentary upon return to home and will need encouragement to AMB more frequently throughout the day. Functional mobility assessment demonstrates increased effort/time requirements, poor tolerance, and need for physical assistance, whereas the patient performed these at a higher level of independence PTA. Pt will benefit from skilled PT intervention to increase independence and safety with basic mobility in preparation for discharge to the venue listed below.       Follow Up Recommendations Home health PT;Other (comment)(resume services at [place].)    Equipment Recommendations  None recommended by PT    Recommendations for Other Services       Precautions / Restrictions Precautions Precautions:  Fall Precaution Comments: subacute ostomy s/p Hartmann's Restrictions Weight Bearing Restrictions: No      Mobility  Bed Mobility Overal bed mobility: Needs Assistance Bed Mobility: Supine to Sit   Sidelying to sit: Min assist          Transfers Overall transfer level: Needs assistance Equipment used: Rolling walker (2 wheeled) Transfers: Sit to/from Stand Sit to Stand: Min assist;From elevated surface         General transfer comment: VC for hand placement  Ambulation/Gait Ambulation/Gait assistance: Min guard Gait Distance (Feet): 90 Feet(136ft AMB max distance prior admission 2WA) Assistive device: Rolling walker (2 wheeled) Gait Pattern/deviations: Step-through pattern Gait velocity: 0.77m/s   General Gait Details: Generally steady - fatigued with effort but no buckling or LOB  Stairs            Wheelchair Mobility    Modified Rankin (Stroke Patients Only)       Balance                                             Pertinent Vitals/Pain Pain Assessment: No/denies pain Pain Intervention(s): Limited activity within patient's tolerance;Monitored during session    Home Living Family/patient expects to be discharged to:: Private residence Living Arrangements: Spouse/significant other Available Help at Discharge: Family Type of Home: House Home Access: Stairs to enter Entrance Stairs-Rails: Right Entrance Stairs-Number of Steps: 3 Home Layout: Able to live on main level with bedroom/bathroom Home Equipment: Cane - single point;Walker - 2 wheels      Prior Function  Level of Independence: Independent         Comments: Pt. was independent with ADLs, and IADLs, driving, and is retired.     Hand Dominance   Dominant Hand: Right    Extremity/Trunk Assessment                Communication   Communication: No difficulties  Cognition Arousal/Alertness: Awake/alert Behavior During Therapy: WFL for tasks  assessed/performed Overall Cognitive Status: Within Functional Limits for tasks assessed                                 General Comments: Patient A & O x 4.      General Comments      Exercises     Assessment/Plan    PT Assessment Patient needs continued PT services  PT Problem List Decreased strength;Decreased mobility;Decreased range of motion;Obesity;Decreased activity tolerance;Cardiopulmonary status limiting activity;Decreased skin integrity;Decreased balance;Decreased knowledge of use of DME;Pain       PT Treatment Interventions DME instruction;Therapeutic activities;Gait training;Therapeutic exercise;Patient/family education;Stair training;Balance training;Functional mobility training;Neuromuscular re-education    PT Goals (Current goals can be found in the Care Plan section)  Acute Rehab PT Goals Patient Stated Goal: return home, return to PLOF PT Goal Formulation: With patient Time For Goal Achievement: 04/12/19 Potential to Achieve Goals: Good    Frequency Min 2X/week   Barriers to discharge   steps to enter home    Co-evaluation               AM-PAC PT "6 Clicks" Mobility  Outcome Measure Help needed turning from your back to your side while in a flat bed without using bedrails?: A Lot Help needed moving from lying on your back to sitting on the side of a flat bed without using bedrails?: A Lot Help needed moving to and from a bed to a chair (including a wheelchair)?: A Little Help needed standing up from a chair using your arms (e.g., wheelchair or bedside chair)?: A Little Help needed to walk in hospital room?: A Little Help needed climbing 3-5 steps with a railing? : A Little 6 Click Score: 16    End of Session Equipment Utilized During Treatment: Gait belt Activity Tolerance: Patient tolerated treatment well;Patient limited by fatigue Patient left: with call bell/phone within reach;in chair;Other (comment)(NA made aware that bed is  wet) Nurse Communication: Mobility status;Other (comment) PT Visit Diagnosis: Muscle weakness (generalized) (M62.81);Unsteadiness on feet (R26.81);Difficulty in walking, not elsewhere classified (R26.2)    Time: 6440-3474 PT Time Calculation (min) (ACUTE ONLY): 19 min   Charges:   PT Evaluation $PT Eval Moderate Complexity: 1 Mod          2:03 PM, 03/29/19 Etta Grandchild, PT, DPT Physical Therapist - [facility]  [phone] ([place])    [place] C 03/29/2019, 2:00 PM

## 2019-03-29 NOTE — Plan of Care (Signed)

## 2019-03-29 NOTE — Consult Note (Signed)
[place] Nurse ostomy follow up Patient receiving care in [place] is for continued ostomy teaching and supplies.  I have entered orders for staff to encourage the patient to do as much of the ostomy care as he can.  His ostomy surgery was on 7/19, went home with [facility], discharged from [facility], now readmitted for wound evaluation.  I have also entered orders for staff to know what ostomy supplies to use.  The patient uses a 2 piece, cut to fit, flat system.  I have provided the [facility] numbers for these as well as barrier rings.  [place] team will check on the patient periodically while in hospital. Thank you for the consult.   Val Riles, RN, MSN, CWOCN, CNS-BC, pager [phone]

## 2019-03-29 NOTE — Progress Notes (Signed)
03/29/2019  Subjective: No acute events.  Patient admitted due to concerns for low grade temp at home, oozing from wound, weakness, and concerns for possible wound infection on CT scan.  Afebrile overnight.  Denies any worsening pain.  Vital signs: Temp:  [98 F (36.7 C)-99.3 F (37.4 C)] 99 F (37.2 C) (08/02 0804) Pulse Rate:  [67-82] 70 (08/02 0804) Resp:  [16-20] 20 (08/02 0804) BP: (115-138)/(60-72) 115/66 (08/02 0804) SpO2:  [91 %-96 %] 93 % (08/02 0804) Weight:  [113.4 kg] 113.4 kg (08/01 1543)   Intake/Output: 08/01 0701 - 08/02 0700 In: 340 [P.O.:240; IV Piggyback:100] Out: 800 [Urine:700; Stool:100] Last BM Date: 03/29/19  Physical Exam: Constitutional: No acute distress Abdomen:  Soft, non-distended, non-tender to palpation.  Midline incision open with healthy granulation tissue.  Wound probed and fascia is intact.  Inferior portion of wound had granulation tissue that has created a bridge, with two openings tunneling together some of the bridge broken up with qtip, but granulation tissue is healing well.  No purulent fluid noted.  Wound packed with wet to dry, with gauze going inside the tunnel as well.  LLQ ostomy pink, viable, with stool and gas in bag.  Labs:  Recent Labs    03/28/19 1104  WBC 8.9  HGB 12.7*  HCT 38.4*  PLT 620*   Recent Labs    03/28/19 1104  NA 134*  K 3.7  CL 100  CO2 24  GLUCOSE 153*  BUN 16  CREATININE 0.89  CALCIUM 8.2*   No results for input(s): LABPROT, INR in the last 72 hours.  Imaging: Ct Abdomen Pelvis W Contrast  Result Date: 03/28/2019 CLINICAL DATA:  Status post colectomy for diverticulitis March 15, 2019. Acute abdomen pain, fever. EXAM: CT ABDOMEN AND PELVIS WITH CONTRAST TECHNIQUE: Multidetector CT imaging of the abdomen and pelvis was performed using the standard protocol following bolus administration of intravenous contrast. CONTRAST:  154mL OMNIPAQUE IOHEXOL 300 MG/ML  SOLN COMPARISON:  March 25, 2019 FINDINGS: Lower  chest: Atelectasis of bilateral lung bases are identified. There are minimal bilateral pleural effusions. Hepatobiliary: There is diffuse low density of the liver without vessel displacement. No focal liver lesion is identified. The gallbladder is distended. The biliary tree is normal. Pancreas: Unremarkable. No pancreatic ductal dilatation or surrounding inflammatory changes. Spleen: Normal in size without focal abnormality. Adrenals/Urinary Tract: The bilateral adrenal glands are normal. Small cysts identified in both kidneys. There is no hydronephrosis bilaterally. The bladder is decompressed limiting evaluation. Stomach/Bowel: Patient status post sigmoid colon resection with left lower quadrant ostomy. There are small foci of free air along the midline ventral abdominal wound with mesenteric stranding. There is a small fluid collection along the inferior internal margin of the wound as well as a small fluid collection along the superior internal margin of the wound suspicious for abscesses unchanged. There is no small bowel obstruction. The stomach is normal. Vascular/Lymphatic: Aortic atherosclerosis. No enlarged abdominal or pelvic lymph nodes. Reproductive: Prostate calcifications are identified. Other: None. Musculoskeletal: Degenerative joint changes of the spine are noted. No acute abnormalities noted. IMPRESSION: Postoperative changes of sigmoid colon resection and left lower quadrant ostomy. There are small air loculations within midline ventral abdominal wound not changed. There are small fluid collections along the superior and inferior internal margin of the wound suspicious for abscesses, unchanged. Electronically Signed   By: Abelardo Diesel M.D.   On: 03/28/2019 12:50    Assessment/Plan: This is a 73 y.o. male s/p Hartmann's, coming with low  grade temp and possible wound infection.  --Will continue IV antibiotics today to help with any wound infection.  At this point, no need for debridement  and there is no purulent fluid.  Discussed with patient that oozing is not uncommon due to the wound surface being raw.  Discussed with him that wound vac could be a possibility, but will defer to Dr. Dahlia Byes since he's the operating surgeon.   --Appreciate wound/ostomy RN input --Will order PT consult to help with deconditioning.   Melvyn Neth, [facility]

## 2019-03-30 ENCOUNTER — Encounter: Payer: Self-pay | Admitting: Surgery

## 2019-03-30 LAB — CBC
HCT: 37.3 % — ABNORMAL LOW (ref 39.0–52.0)
Hemoglobin: 12.2 g/dL — ABNORMAL LOW (ref 13.0–17.0)
MCH: 29.8 pg (ref 26.0–34.0)
MCHC: 32.7 g/dL (ref 30.0–36.0)
MCV: 91 fL (ref 80.0–100.0)
Platelets: 533 10*3/uL — ABNORMAL HIGH (ref 150–400)
RBC: 4.1 MIL/uL — ABNORMAL LOW (ref 4.22–5.81)
RDW: 13.7 % (ref 11.5–15.5)
WBC: 13.3 10*3/uL — ABNORMAL HIGH (ref 4.0–10.5)
nRBC: 0.5 % — ABNORMAL HIGH (ref 0.0–0.2)

## 2019-03-30 LAB — AEROBIC CULTURE W GRAM STAIN (SUPERFICIAL SPECIMEN): Gram Stain: NONE SEEN

## 2019-03-30 NOTE — Progress Notes (Signed)
Pt seen and examined. Wound examined. There is fibrinous material on inferior portion, there is a bridge within the sub q that I broke to be able to pack wound appropriately. There is no evidence f wound infection and what is draining is fat necrosis. The fascia is intact but has fibrinous exudadate ( expected after fascia is closed w PDS in a running fashion) We will plan for wound vac tomorrow and continue a/bs He needs to mobilize D/w family in detail

## 2019-03-30 NOTE — Progress Notes (Signed)
Physical Therapy Treatment Patient Details Name: Tyrone Luna MRN: [id number] DOB: 1946/06/15 Today's Date: 03/30/2019    History of Present Illness Tyrone Luna is a 30yoM who comes back to [facility] after continued concerns of drainage from subacute ABD wound, noted mild fever. Pt underwent Hartmann's Procedure prior admission and was here 7/16-7/27. GI believes patient to be infection free at this time. Pt has continued to have weakness at home, only seen HHPT 1x for evaluation thus far.    PT Comments    Pt awake upon entry, appears more rested this morning. Pt agreeable to participate. Bed level strengthening first to address focal strength impairment. Pt AMB progressed by >12% in distance, and >40% in velocity, DOE at end of AMB much improved. Pt typically does not like recliner as it hurts his bottom, but is agreeable to sittign EOB. Wife enters at end of session. RN made aware that infusion complete on IV.   Follow Up Recommendations  Home health PT;Other (comment)     Equipment Recommendations  None recommended by PT    Recommendations for Other Services       Precautions / Restrictions Precautions Precautions: Fall Precaution Comments: subacute ostomy s/p Hartmann's Restrictions Weight Bearing Restrictions: No    Mobility  Bed Mobility Overal bed mobility: Needs Assistance Bed Mobility: Supine to Sit;Sit to Supine     Supine to sit: Min assist        Transfers   Equipment used: Rolling walker (2 wheeled) Transfers: Sit to/from Stand Sit to Stand: From elevated surface;Min guard         General transfer comment: VC for hand placement  Ambulation/Gait Ambulation/Gait assistance: Min guard Gait Distance (Feet): 110 Feet Assistive device: Rolling walker (2 wheeled) Gait Pattern/deviations: Step-through pattern Gait velocity: 0.78m/s today (0.68m/s 1DA)   General Gait Details: Generally steady - fatigued with effort but no buckling or LOB; cues to  stand tall, keep RW close for turns   Marine scientist [place] (Stroke Patients Only)       Balance                                            Cognition                                              Exercises Total Joint Exercises Bridges: Strengthening;AROM;Both;10 reps;Supine;Limitations Bridges Limitations: heigh limited by weakness General Exercises - Lower Extremity Short Arc Quad: AROM;Both;15 reps;Supine Heel Slides: AROM;Both;15 reps;Supine Hip ABduction/ADduction: AROM;Both;Supine;15 reps Hip Flexion/Marching: AROM;Both;10 reps;Supine    General Comments        Pertinent Vitals/Pain Pain Assessment: No/denies pain    Home Living                      Prior Function            PT Goals (current goals can now be found in the care plan section) Acute Rehab PT Goals Patient Stated Goal: return home, return to PLOF PT Goal Formulation: With patient Time For Goal Achievement: 04/12/19 Potential to Achieve Goals: Good Progress towards PT goals: Progressing toward goals    Frequency  Min 2X/week      PT Plan Current plan remains appropriate    Co-evaluation              AM-PAC PT "6 Clicks" Mobility   Outcome Measure  Help needed turning from your back to your side while in a flat bed without using bedrails?: A Little Help needed moving from lying on your back to sitting on the side of a flat bed without using bedrails?: A Little Help needed moving to and from a bed to a chair (including a wheelchair)?: A Little Help needed standing up from a chair using your arms (e.g., wheelchair or bedside chair)?: A Little Help needed to walk in hospital room?: A Little Help needed climbing 3-5 steps with a railing? : A Little 6 Click Score: 18    End of Session Equipment Utilized During Treatment: Gait belt Activity Tolerance: Patient tolerated treatment  well;Patient limited by fatigue Patient left: with call bell/phone within reach;in chair;Other (comment) Nurse Communication: Mobility status;Other (comment) PT Visit Diagnosis: Muscle weakness (generalized) (M62.81);Unsteadiness on feet (R26.81);Difficulty in walking, not elsewhere classified (R26.2)     Time: 8921-1941 PT Time Calculation (min) (ACUTE ONLY): 24 min  Charges:  $Therapeutic Exercise: 23-37 mins                     10:15 AM, 03/30/19 Etta Grandchild, PT, DPT Physical Therapist - [facility]  [phone] ([place])    [place] C 03/30/2019, 10:13 AM

## 2019-03-30 NOTE — Consult Note (Addendum)
[place] Nurse ostomy follow up Surgical team is following for assessment and plan of care for abd wound.   Pt is familiar to [facility] team from recent admission; he had colostomy surgery performed on 7/19 and was discharged on 7/27.  He had teaching sessions during the previous admission but requests another pouch change demonstration from the [facility] team on Wed with his wife present.  He states the bedside nurse already changed the pouch this AM, so he does not want it performed again today.  Stoma type/location: Stoma is red and viable when visualized through the pouch, which is intact with a good seal.   Output: no stool or flatus is in the current pouch  Ostomy pouching: 2pc with barrier ring.  Enrolled patient in [place] Discharge program: Yes, previously.  Extra supplies at the bedside for staff nurses use.   [place] team will plan to preform a visit on Wed as requested by the patient. Thank-you,  Julien Girt MSN, [place], [place], [place], [place]

## 2019-03-30 NOTE — Clinical Social Work Note (Signed)
Patient is active with [facility] for PT, OT, and RN. Called [employer] to order wound vac. Per PA, patient will discharge on PO abx. No new DME recommendations.  Dayton Scrape, [place]

## 2019-03-30 NOTE — Progress Notes (Signed)
[place] Hospital Day(s): 2.   Post op day(s):  Mar[place] Kitchen   Interval History: Patient seen and examined, no acute events or new complaints overnight. Patient reports he is doing well. He notes improvement in his abdominal discomfort. No fever, chills, nausea, or emesis. He is tolerating a diet with continued colostomy function. Did have slight bump in WBC to 13K currently on Zosyn. He is agreeable to wound vac placement at this time.   Vital signs in last 24 hours: [min-max] current  Temp:  [97.7 F (36.5 C)-98.9 F (37.2 C)] 97.7 F (36.5 C) (08/03 0515) Pulse Rate:  [66-82] 70 (08/03 0755) Resp:  [18-20] 18 (08/03 0515) BP: (127-142)/(64-74) 142/74 (08/03 0755) SpO2:  [93 %] 93 % (08/03 0515)     Height: 6\' 1"  (185.4 cm) Weight: 113.4 kg BMI (Calculated): 32.99   Intake/Output last 2 shifts:  08/02 0701 - 08/03 0700 In: 1360.3 [P.O.:1200; IV Piggyback:160.3] Out: 1000 [Urine:1000]   Physical Exam:  Constitutional: alert, cooperative and no distress  Respiratory: breathing non-labored at rest  Gastrointestinal: soft, non-tender, and non-distended. Colostomy in LLQ which is pink and patent with gas in bag.  Integumentary: Midline incision is healing well, some undermining in inferior portion of the wound, no surrounding erythema, no purulent drainage.   Labs:  CBC Latest Ref Rng & Units 03/30/2019 03/28/2019 03/25/2019  WBC 4.0 - 10.5 K/uL 13.3(H) 8.9 9.1  Hemoglobin 13.0 - 17.0 g/dL 12.2(L) 12.7(L) 13.4  Hematocrit 39.0 - 52.0 % 37.3(L) 38.4(L) 39.6  Platelets 150 - 400 K/uL 533(H) 620(H) 615(H)   CMP Latest Ref Rng & Units 03/28/2019 03/25/2019 03/21/2019  Glucose 70 - 99 mg/dL 153(H) 139(H) -  BUN 8 - 23 mg/dL 16 24(H) -  Creatinine 0.61 - 1.24 mg/dL 0.89 0.89 -  Sodium 135 - 145 mmol/L 134(L) 132(L) -  Potassium 3.5 - 5.1 mmol/L 3.7 3.6 3.8  Chloride 98 - 111 mmol/L 100 99 -  CO2 22 - 32 mmol/L 24 22 -  Calcium 8.9 - 10.3 mg/dL 8.2(L) 8.2(L)  -  Total Protein 6.5 - 8.1 g/dL - 6.2(L) -  Total Bilirubin 0.3 - 1.2 mg/dL - 1.3(H) -  Alkaline Phos 38 - 126 U/L - 87 -  AST 15 - 41 U/L - 39 -  ALT 0 - 44 U/L - 60(H) -     Imaging studies: No new pertinent imaging studies   Assessment/Plan:  73 y.o. male with readmitted for failure to thrive vs wound infection, overall doing well although slight increase in leukocytosis.    - Soft diet  - continue wet to dry dressing BID for now, will arrange for home wound vac to be placed tomorrow   - pain control prn  - Monitor abdominal examination; colostomy function    - mobilization encouraged   - medical management of comorbidities   - Discharge Planning: will plan on placing wound vac tomorrow and hopefully discharge in the afternoon   All of the above findings and recommendations were discussed with the patient, patient's family, and the medical team, and all of patient's and family's questions were answered to their expressed satisfaction.  -- Edison Simon, PA-C [facility] 03/30/2019, 8:12 AM [phone] M-F: 7am - 4pm

## 2019-03-31 MED ORDER — CLINDAMYCIN HCL 300 MG PO CAPS: 300.0000 mg | ORAL_CAPSULE | Freq: Three times a day (TID) | ORAL | 0 refills | Status: AC

## 2019-03-31 NOTE — TOC Transition Note (Signed)
Transition of Care [facility]) - CM/SW Discharge Note   Patient Details  Name: Tyrone Luna MRN: [id number] Date of Birth: 08-12-1946  Transition of Care [facility]) CM/SW Contact:  Candie Chroman, LCSW Phone Number: 03/31/2019, 1:14 PM   Clinical Narrative: Wound vac has been delivered and put on. Home health orders are in. Left message for [facility] representative to notify. No further concerns. CSW signing off.    Final next level of care: [place] Barriers to Discharge: Barriers Resolved   Patient Goals and CMS Choice        Discharge Placement                    Patient and family notified of of transfer: 03/31/19  Discharge Plan and Services                DME Arranged: Vac DME Agency: [employer] Date DME Agency Contacted: 03/31/19   Representative spoke with at DME Agency: [place]: RN, PT, OT, Nurse's Aide [place] Agency: [place] ([place]) Date HH Agency Contacted: 03/31/19   Representative spoke with at [place]: [place] ([place]) Interventions     Readmission Risk Interventions No flowsheet data found.

## 2019-03-31 NOTE — Discharge Summary (Signed)
[facility] SURGICAL DISCHARGE SUMMARY  Patient ID: Tyrone Luna MRN: [id number] DOB/AGE: [birth year] 73 y.o.  Admit date: 03/28/2019 Discharge date: 03/31/2019  Discharge Diagnoses Patient Active Problem List   Diagnosis Date Noted  . Wound infection after surgery   . Wound infection 03/28/2019  . Diverticulitis large intestine 03/12/2019  . Morbid obesity due to excess calories ([place]) 05/18/2015  . BPH with obstruction/lower urinary tract symptoms 05/05/2015  . Nocturia 05/05/2015  . Erectile dysfunction of organic origin 05/05/2015  . ED (erectile dysfunction) of organic origin 03/22/2015  . Allergy to environmental factors 03/22/2015  . Hyperlipidemia, unspecified 03/22/2015  . BP (high blood pressure) 03/22/2015  . 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 Apnea, sleep 03/22/2015    Consultants None  Procedures None  HPI: 73 y.o. male presented to [facility] ED for evaluation of fever and draining per wound. Patient reports that since he was discharged he has been draining "pus". He was recently admitted due to perforated diverticulitis and had Hartmann's procedure. Patient has open wound due to contaminated case. He was discharged with Home Health. He called home health today due to wound drainage and they recommended to come to ED. This is the second visit to ED since discharge for the same complain. Patient also report that EMS took temp at home today and was 100.6 F. Denies nausea and vomiting. Report tolerating diet. Able to ambulate with walker. Denies abdominal pain. No pain radiation. No alleviating or aggravating factors.   Hospital Course: Patient did well throughout his admission. He was found to have some bridging and undermining of midline wound which was broken up. No erythema or gross purulence from wound. His labs were reassuring and he was able to tolerate PO intake and mobilizing appropriately throughout the admission. On day of discharge (08/04), a wound vac was  placed in his midline wound and HHRN was arranged for wound vac changes 3x a week. The remainder of this hospital admission was unremarkable.   WOUND VAC INSTRUCTIONS Two pieces of white form placed on inferior portion of the wound to cover exposed fascia Grey foam placed in 'hour glass' shape on top of pink/red beefy granulation tissue  Discharge Condition: Good   Physical Examination:  Constitutional: Well appearing male, NAD Pulmonary: Normal effort, no respiratory distress Gastrointestinal: Soft, non-tender, non-distended, colostomy in LLQ which is pink, patent with stool and gas in bag.  Skin: Midline incision is healing via secondary intention, no purulence, fascia intact - wound vac placed prior to discharge with good seal.     Allergies as of 03/31/2019   No Known Allergies     Medication List    STOP taking these medications   metroNIDAZOLE 250 MG tablet Commonly known as: Flagyl     TAKE these medications   amLODipine 10 MG tablet Commonly known as: NORVASC Take 10 mg by mouth daily.   aspirin 81 MG tablet Take 81 mg by mouth daily.   augmented betamethasone dipropionate 0.05 % cream Commonly known as: DIPROLENE-AF Apply topically.   cefdinir 300 MG capsule Commonly known as: OMNICEF Take 1 capsule (300 mg total) by mouth 2 (two) times daily for 7 days.   cetirizine 10 MG tablet Commonly known as: ZYRTEC Take 10 mg by mouth daily.   clindamycin 300 MG capsule Commonly known as: Cleocin Take 1 capsule (300 mg total) by mouth 3 (three) times daily for 7 days.   dutasteride 0.5 MG capsule Commonly known as: AVODART Take 1 capsule (0.5 mg total) by  mouth daily.   EpiPen 2-Pak 0.3 mg/0.3 mL Soaj injection Generic drug: EPINEPHrine   fluticasone 50 MCG/ACT nasal spray Commonly known as: FLONASE   GLUCOSAMINE 1500 COMPLEX PO Take by mouth.   loratadine-pseudoephedrine 10-240 MG 24 hr tablet Commonly known as: CLARITIN-D 24-hour Take 1 tablet by mouth  daily.   omeprazole 40 MG capsule Commonly known as: PRILOSEC Take 40 mg by mouth daily.   oxyCODONE 5 MG immediate release tablet Commonly known as: Oxy IR/ROXICODONE Take 1 tablet (5 mg total) by mouth every 6 (six) hours as needed for severe pain.   pilocarpine 5 MG tablet Commonly known as: SALAGEN Take 5 mg by mouth 2 (two) times daily.   PRESERVISION AREDS 2 PO Take by mouth.   sildenafil 20 MG tablet Commonly known as: REVATIO Take 3 to 5 tablets two hours before intercouse on an empty stomach.  Do not take with nitrates.   simvastatin 20 MG tablet Commonly known as: ZOCOR Take 20 mg by mouth daily at 6 PM.   tadalafil 20 MG tablet Commonly known as: CIALIS Take 1 tablet (20 mg total) by mouth daily as needed for erectile dysfunction.   triamcinolone cream 0.5 % Commonly known as: KENALOG APPLY TOPICALLY TWO TIMES DAILY FOR UP TO 7 TO 10 DAYS.   triamterene-hydrochlorothiazide 37.5-25 MG capsule Commonly known as: DYAZIDE Take 1 capsule by mouth daily.   Trospium Chloride 60 MG Cp24 Take 1 capsule (60 mg total) by mouth daily.        Follow-up Information    [place], [place] F, MD. Schedule an appointment as soon as possible for a visit in 1 week(s).   Specialty: General Surgery Why: s/p hartmans, wound vac to midline wound, have patient bring wound vac supplies Contact information: [address] [place] [address] [phone]            Time spent on discharge management including discussion of hospital course, clinical condition, outpatient instructions, prescriptions, and follow up with the patient and members of the medical team: >30 minutes  -- Edison Simon , PA-C [facility]  03/31/2019, 12:58 PM [phone] M-F: 7am - 4pm

## 2019-03-31 NOTE — Care Management Important Message (Signed)
Important Message  Patient Details  Name: Tyrone Luna MRN: [id number] Date of Birth: [birth year]   Medicare Important Message Given:  Yes     [name] 03/31/2019, 11:04 AM

## 2019-03-31 NOTE — Plan of Care (Signed)

## 2019-04-02 LAB — CULTURE, BLOOD (ROUTINE X 2)
Culture: NO GROWTH
Culture: NO GROWTH
Special Requests: ADEQUATE

## 2019-04-08 ENCOUNTER — Encounter: Payer: Self-pay | Admitting: Surgery

## 2019-04-08 ENCOUNTER — Other Ambulatory Visit: Payer: Self-pay

## 2019-04-08 ENCOUNTER — Ambulatory Visit (INDEPENDENT_AMBULATORY_CARE_PROVIDER_SITE_OTHER): Payer: Medicare HMO | Admitting: Surgery

## 2019-04-08 VITALS — BP 129/86 | HR 91 | Temp 95.9°F | Ht 73.0 in | Wt 245.0 lb

## 2019-04-08 DIAGNOSIS — K5720 Diverticulitis of large intestine with perforation and abscess without bleeding: Secondary | ICD-10-CM | POA: Diagnosis not present

## 2019-04-08 DIAGNOSIS — Z09 Encounter for follow-up examination after completed treatment for conditions other than malignant neoplasm: Secondary | ICD-10-CM

## 2019-04-08 NOTE — Patient Instructions (Addendum)
The patient is aware to call back for any questions or new concerns. Wound vac and canister was changed today  Continue changes at home with Home Health

## 2019-04-09 ENCOUNTER — Encounter: Payer: Self-pay | Admitting: Surgery

## 2019-04-09 NOTE — Progress Notes (Signed)
S/p Hartmann;s w prolonged hospitalization for perf diverticulitis Wound vac dressing to midline Colstomy working well Taking Po Ambulating 100 yards Still weak   PE chronically ill and debilitated in NAD Abd: soft, wound vac removed and change measures 25 cm x 2.5 cms 3.5 cms greatest depth. Good granulation tissue. Bottom portion w exposed intact fascia. Vac replaced w good seal \ A/p Doing well F/w 2 weeks w Mr. Olean Ree [facility] Continue nutritional support and PT.

## 2019-04-22 ENCOUNTER — Ambulatory Visit (INDEPENDENT_AMBULATORY_CARE_PROVIDER_SITE_OTHER): Payer: Medicare HMO | Admitting: Physician Assistant

## 2019-04-22 ENCOUNTER — Other Ambulatory Visit: Payer: Self-pay

## 2019-04-22 ENCOUNTER — Encounter: Payer: Self-pay | Admitting: Physician Assistant

## 2019-04-22 VITALS — BP 119/77 | HR 87 | Temp 95.5°F | Ht 73.0 in | Wt 231.0 lb

## 2019-04-22 DIAGNOSIS — Z09 Encounter for follow-up examination after completed treatment for conditions other than malignant neoplasm: Secondary | ICD-10-CM

## 2019-04-22 DIAGNOSIS — K5720 Diverticulitis of large intestine with perforation and abscess without bleeding: Secondary | ICD-10-CM

## 2019-04-22 NOTE — Progress Notes (Signed)
[facility] POST-OP OFFICE VISIT  04/22/2019  HPI: Tyrone Luna is a 73 y.o. male 6 weeks s/p Hartman's Procedure for diverticulitis. Overall doing better. Pain significantly improved. No issues with colostomy. Continuing to do wound vac changes MWF at home without issue. No other complaints. .   Vital signs: Ht 6\' 1"  (1.854 m)   BMI 32.32 kg/m    Physical Exam: Constitutional: Well appearing male, NAD Abdomen: Soft, non-tender, non-distended, no rebound/guarding. Colostomy in left abdomen is patent, semi-solid stool in bag.  Skin: Wound vac removed and replaced, midline wound with beefy red granulation tissue, there is approximately a 1x1 cm opening in the inferior portion of the wound, fascia intact, fibrinous drainage, no erythema, no purlence  Assessment/Plan: This is a 73 y.o. male 6 weeks s/p Hartman's Procedure   - Continue home wound vac changes MWF; changed today  - Reinforced importance of nutrition for wound healing  - pain control prn  - monitor colostomy output  - follow up in 2-3 weeks  -- Edison Simon, PA-C [facility] 04/22/2019, 10:58 AM [phone] M-F: 7am - 4pm

## 2019-04-22 NOTE — Patient Instructions (Addendum)
The patient is aware to call back for any questions or new concerns. Continue wound vac and home health care

## 2019-05-11 ENCOUNTER — Other Ambulatory Visit: Payer: Self-pay

## 2019-05-11 ENCOUNTER — Encounter: Payer: Self-pay | Admitting: Physician Assistant

## 2019-05-11 ENCOUNTER — Ambulatory Visit (INDEPENDENT_AMBULATORY_CARE_PROVIDER_SITE_OTHER): Payer: Medicare HMO | Admitting: Physician Assistant

## 2019-05-11 VITALS — BP 137/86 | HR 92 | Temp 97.9°F | Ht 73.0 in | Wt 219.0 lb

## 2019-05-11 DIAGNOSIS — Z09 Encounter for follow-up examination after completed treatment for conditions other than malignant neoplasm: Secondary | ICD-10-CM

## 2019-05-11 DIAGNOSIS — K5720 Diverticulitis of large intestine with perforation and abscess without bleeding: Secondary | ICD-10-CM

## 2019-05-11 NOTE — Patient Instructions (Signed)
Do daily wet to dry dressing changes with gauze and normal saline.  You may shower after removing the dressing.   Follow up with Dr Dahlia Byes in 1 month.

## 2019-05-11 NOTE — Progress Notes (Signed)
[facility] POST-OP OFFICE VISIT  05/11/2019  HPI: Tyrone Luna is a 73 y.o. male almost 2 months s/p hartman's procedure with Dr Dahlia Byes for perforated diverticulitis.   Today, he reports he is doing well. No acute issues. Continues to do wound vac changes MWF with HHRN.    Vital signs: BP 137/86   Pulse 92   Temp 97.9 F (36.6 C)   Ht 6\' 1"  (1.854 m)   Wt 219 lb (99.3 kg)   SpO2 96%   BMI 28.89 kg/m    Physical Exam: Constitutional: Well appearing male, NAD Abdomen: Soft, non-tender, non-distended. Colostomy in left abdomen is pink, patent, gas and stool in bag.  Skin: Wound vac removed. Midline incision is healing well. The wound is closed with about 1 cm of beefy red granulation tissues left on the more superficial layer, no surrounding erythema or drainage  Assessment/Plan: This is a 73 y.o. male overall doing well 2 months s/p Hartman's procedure for perforated diverticulitis   - Discontinue wound vac  - wet-to-dry dressing changes daily prn; continue home health RN  - okay to shower; instructions given  - monitor colostomy output  - nutrition for wound healing  - rtc in 1 month with Dr Dahlia Byes to initiate reversal planning (typically 6 months following initial surgery)  -- Edison Simon, PA-C [facility] 05/11/2019, 10:29 AM [phone] M-F: 7am - 4pm

## 2019-06-08 ENCOUNTER — Encounter: Payer: Self-pay | Admitting: Surgery

## 2019-06-08 ENCOUNTER — Telehealth: Payer: Self-pay

## 2019-06-08 ENCOUNTER — Other Ambulatory Visit: Payer: Self-pay

## 2019-06-08 ENCOUNTER — Ambulatory Visit (INDEPENDENT_AMBULATORY_CARE_PROVIDER_SITE_OTHER): Payer: Medicare HMO | Admitting: Surgery

## 2019-06-08 VITALS — BP 137/74 | HR 80 | Temp 97.3°F | Ht 73.0 in | Wt 216.4 lb

## 2019-06-08 DIAGNOSIS — R0609 Other forms of dyspnea: Secondary | ICD-10-CM

## 2019-06-08 DIAGNOSIS — R0600 Dyspnea, unspecified: Secondary | ICD-10-CM

## 2019-06-08 MED ORDER — POLYETHYLENE GLYCOL 3350 17 GM/SCOOP PO POWD: ORAL | 0 refills | Status: DC

## 2019-06-08 MED ORDER — BISACODYL 5 MG PO TBEC: DELAYED_RELEASE_TABLET | ORAL | 0 refills | Status: DC

## 2019-06-08 NOTE — Telephone Encounter (Signed)
Spoke with patient and patient's wife to notify of patient's appointment with Radiology Friday at 8:45am. Patient's wife notified of Prep instructions and also notified that prescriptions will be sent over to patient's pharmacy. Informed patient's wife that if she has any questions or concerns to contact our office. Pt's wife verbalizes understanding.

## 2019-06-08 NOTE — Patient Instructions (Addendum)
Dr.Pabon would like for you to follow up in two months on 08/10/19 at 9:15am. We will contact you to get you scheduled for your Barium enema. Dr.Parashos office will contact you to get you scheduled for your cardiology referral.   Exploratory Laparotomy, Adult, Care After This sheet gives you information about how to care for yourself after your procedure. Your health care provider may also give you more specific instructions. If you have problems or questions, contact your health care provider. What can I expect after the procedure? After the procedure, it is common to have:  Abdominal soreness.  Fatigue.  A sore throat from the tube in your throat.  A lack of appetite. Follow these instructions at home: Medicines  Take over-the-counter and prescription medicines only as told by your health care provider.  If you were prescribed an antibiotic medicine, take it as told by your health care provider. Do not stop taking the antibiotic even if you start to feel better.  Do not drive or operate heavy machinery while taking pain medicine.  If you are taking prescription pain medicine, take actions to prevent or treat constipation. Your health care provider may recommend that you: ? Drink enough fluid to keep your urine pale yellow. ? Eat foods that are high in fiber, such as fresh fruits and vegetables, whole grains, and beans. ? Limit foods that are high in fat and processed sugars, such as fried or sweet foods. ? Take an over-the-counter or prescription medicine for constipation. Undergoing surgery and taking pain medicines can make constipation worse. Incision care   Follow instructions from your health care provider about how to take care of your incision. Make sure you: ? Wash your hands with soap and water before you change your bandage (dressing). If soap and water are not available, use hand sanitizer. ? Change your dressing as told by your health care provider. ? Leave stitches  (sutures), skin glue, or adhesive strips in place. These skin closures may need to stay in place for 2 weeks or longer. If adhesive strip edges start to loosen and curl up, you may trim the loose edges. Do not remove adhesive strips completely unless your health care provider tells you to do that.  If you were sent home with a drain, follow instructions from your health care provider about how to care for it.  Check your incision area every day for signs of infection. Check for: ? Redness, swelling, or pain. ? Fluid or blood. ? Warmth. ? Pus or a bad smell. Activity   Rest as told by your health care provider. ? Avoid sitting for a long time without moving. Get up to take short walks every 1-2 hours. This is important to improve blood flow and breathing. Ask for help if you feel weak or unsteady.  Do not lift anything that is heavier than 5 lb (2.2 kg), or the limit that your health care provider tells you, until he or she says that it is safe.  Ask your health care provider when you can start to do your usual activities again, such as driving, going back to work, and having sex. Eating and drinking  You may eat what you usually eat. Include lots of whole grains, fruits, and vegetables in your diet. This will help to prevent constipation.  Drink enough fluid to keep your urine pale yellow. Bathing  Keep your incision clean and dry. Clean it as often as told by your health care provider: ? Gently wash the  incision with soap and water. ? Rinse the incision with water to remove all soap. ? Pat the incision dry with a clean towel. Do not rub the incision.  You may take showers after 48 hours.  Do not take baths, swim, or use a hot tub until your health care provider says it is okay to do so. General instructions  Do not use any products that contain nicotine or tobacco, such as cigarettes and e-cigarettes. These can delay healing after surgery. If you need help quitting, ask your  health care provider.  Wear compression stockings as told by your health care provider. These stockings help to prevent blood clots and reduce swelling in your legs.  Keep all follow-up visits as told by your health care provider. This is important. Contact a health care provider if:  You have a fever.  You have chills.  Your pain medicine is not helping.  You have constipation or diarrhea.  You have nausea or vomiting.  You have drainage, redness, swelling, or pain at your incision site. Get help right away if:  Your pain is getting worse.  You have not had a bowel movement for more than 3 days.  You have ongoing (persistent) vomiting.  The edges of your incision open up.  You have warmth, tenderness, and swelling in your calf.  You have trouble breathing.  You have chest pain. These symptoms may represent a serious problem that is an emergency. Do not wait to see if the symptoms will go away. Get medical help right away. Call your local emergency services (911 in the [place]). Do not drive yourself to the hospital. Summary  Abdominal soreness is common after exploratory laparotomy. Take over-the-counter and prescription pain medicines only as told by your health care provider.  Follow instructions from your health care provider about how to take care of your incision. Do not take baths, swim, or use a hot tub until your health care provider says it is okay to do so.  Watch for signs and symptoms of infection after surgery, including fever, chills, drainage from your incision, and worsening abdominal pain. This information is not intended to replace advice given to you by your health care provider. Make sure you discuss any questions you have with your health care provider. Document Released: 03/27/2004 Document Revised: 10/06/2018 Document Reviewed: 08/23/2017 Elsevier Patient Education  2020 [employer].

## 2019-06-10 NOTE — Progress Notes (Signed)
Outpatient Surgical Follow Up  06/10/2019  Tyrone Luna is an 73 y.o. male.   Chief Complaint  Patient presents with  . Routine Post Op    HPI: Tyrone Luna is a 73 year old 2 months out from a Hartmann procedure for perforated diverticulitis.  He continues to endorse weakness and has continued to lose weight.  He has been taking p.o.  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  He is ambulating.  He reports significant overall weakness and significant dyspnea on exertion with some occasional chest discomfort. He denies any fevers any chills.  His wound VAC has been removed with almost complete resolution and healing of the midline wound that was left to heal by secondary intention with a wound VAC.  Past Medical History:  Diagnosis Date  . Arthritis   . BPH associated with nocturia   . ED (erectile dysfunction)   . Erectile dysfunction   . Hyperlipemia   . Hypertension   . Male hypogonadism   . Sleep apnea   . Urinary frequency   . Urinary urgency     Past Surgical History:  Procedure Laterality Date  . EYE SURGERY Bilateral 2002   laser  . INGUINAL HERNIA REPAIR Left   . KNEE SURGERY Right 1995  . LAPAROTOMY N/A 03/15/2019   Procedure: EXPLORATORY LAPAROTOMY,sigmoid colectomy,colostomy;  Surgeon: Jules Husbands, MD;  Location: [facility] ORS;  Service: General;  Laterality: N/A;    Family History  Problem Relation Age of Onset  . Hypertension Other   . Prostate cancer Neg Hx   . Bladder Cancer Neg Hx   . Kidney cancer Neg Hx     Social History:  reports that he has quit smoking. He has never used smokeless tobacco. He reports that he does not drink alcohol or use drugs.  Allergies: No Known Allergies  Medications reviewed.    ROS Full ROS performed and is otherwise negative other than what is stated in HPI   BP 137/74   Pulse 80   Temp (!) 97.3 F (36.3 C)   Ht 6\' 1"  (1.854 m)   Wt 216 lb 6.4 oz (98.2 kg)   SpO2 98%   BMI 28.55 kg/m   Physical Exam Vitals  signs and nursing note reviewed.  Constitutional:      General: He is not in acute distress.    Appearance: Normal appearance. He is normal weight.  Neck:     Musculoskeletal: Normal range of motion and neck supple. No neck rigidity or muscular tenderness.  Cardiovascular:     Rate and Rhythm: Tachycardia present.     Heart sounds: Normal heart sounds. No murmur.  Pulmonary:     Effort: Pulmonary effort is normal. No respiratory distress.     Breath sounds: Normal breath sounds. No stridor.  Abdominal:     General: Abdomen is flat. There is no distension.     Palpations: There is no mass.     Tenderness: There is no abdominal tenderness.     Hernia: No hernia is present.     Comments: Midline is scarred there is a tiny 1 x 1 cm superficial wound that I covered with a Band-Aid.  There is no evidence of deep abscess there is no evidence of dehiscence or necrotizing infection.  There is no peritonitis  Musculoskeletal: Normal range of motion.        General: No swelling.  Skin:    General: Skin is warm and dry.     Capillary Refill: Capillary refill  takes less than 2 seconds.  Neurological:     General: No focal deficit present.     Mental Status: He is alert and oriented to person, place, and time.  Psychiatric:        Mood and Affect: Mood normal.        Behavior: Behavior normal.        Thought Content: Thought content normal.       Assessment/Plan 73 year old male status post Hartman's for perforated diverticulitis.  Failure to thrive and dyspnea on exertion and overall severe weakness and debility.  Had an extensive discussion with the patient and the family.  I will certainly feel better if he gets evaluated by cardiology and cardiology obtains an echo since he has not had one.  He has definitely taken him a long time to recover.  He is does not have any acute surgical issues at this time.  I will see him back in a few months.  I will also obtain a barium enema via the ostomy  and the rectal stump to assess the anatomy for reversal. Encourage ambulation and nutritional support.  There is note that the majority of this encounter was not related to surgical follow-up but other medical issues Greater than 50% of the 40 minutes  visit was spent in counseling/coordination of care   Caroleen Hamman, MD [place] Surgeon

## 2019-06-12 ENCOUNTER — Other Ambulatory Visit: Payer: Self-pay

## 2019-06-12 ENCOUNTER — Ambulatory Visit
Admission: RE | Admit: 2019-06-12 | Discharge: 2019-06-12 | Disposition: A | Discharge status: Home / Self Care | Payer: Medicare HMO | Source: Ambulatory Visit | Attending: Surgery | Admitting: Surgery

## 2019-06-12 DIAGNOSIS — R0609 Other forms of dyspnea: Secondary | ICD-10-CM

## 2019-06-12 DIAGNOSIS — R0600 Dyspnea, unspecified: Secondary | ICD-10-CM | POA: Diagnosis present

## 2019-06-12 IMAGING — CR DG BE THRU COLOSTOMY
15 of 24 series · 15 of 24 positions shown · non-contrast
Comparison: CT [DATE].

CLINICAL DATA: Sigmoid colectomy. Colostomy. Evaluation prior to
reconnect surgery.

EXAM:
SINGLE COLUMN BARIUM ENEMA
TECHNIQUE: Initial scout AP supine abdominal image obtained to insure adequate
colon cleansing. Barium was introduced into the colon in a
retrograde fashion and refluxed from the rectum to the cecum. Spot
images of the colon followed by overhead radiographs were obtained.
FLUOROSCOPY TIME:  Fluoroscopy Time:  6 minutes 18 seconds
Radiation Exposure Index (if provided by the fluoroscopic device):
250.9 mGy

[t abdomen supine]
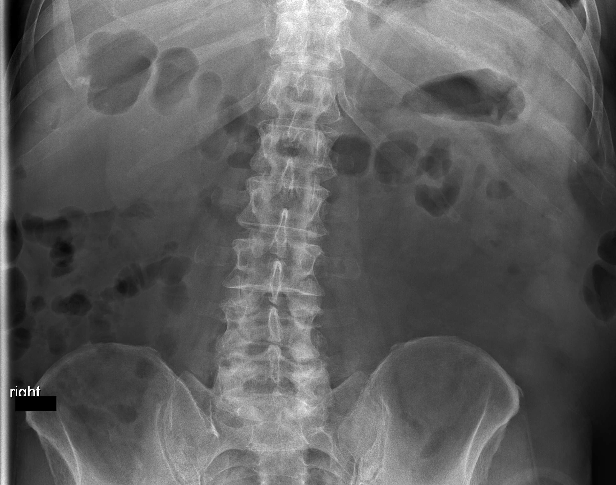

[fluoro_barium singleshot bariatric_bw (1 of 12)]
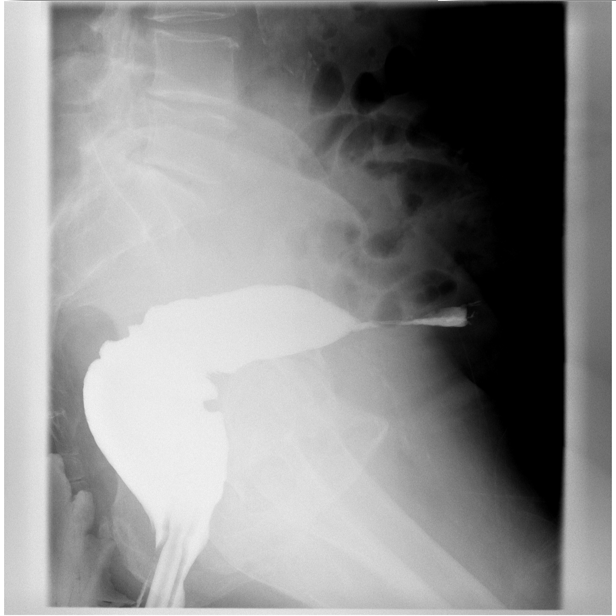

[fluoro_barium singleshot bariatric_bw (2 of 12)]
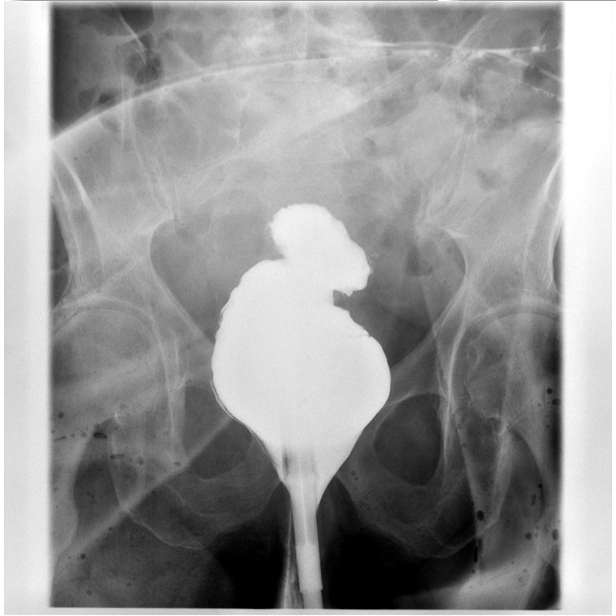

[fluoro_barium singleshot bariatric_bw (3 of 12)]
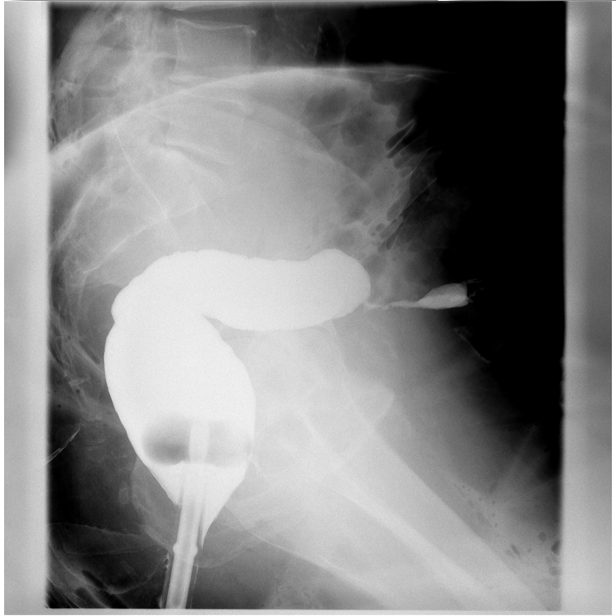

[fluoro_barium singleshot bariatric_bw (4 of 12)]
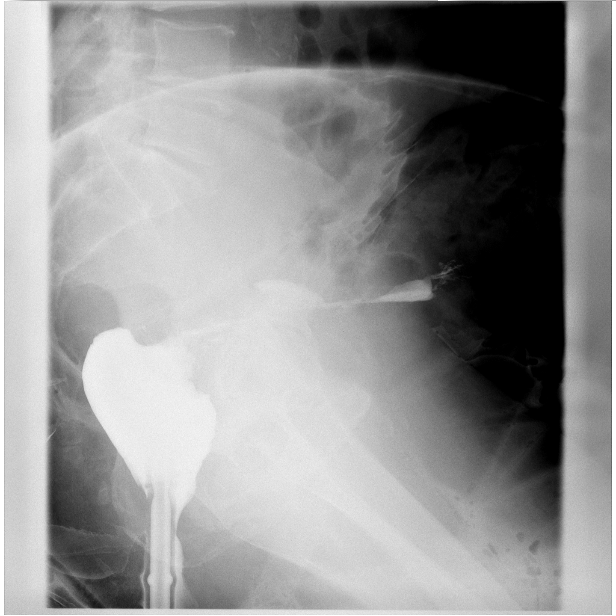

[fluoro_barium singleshot bariatric_bw (5 of 12)]
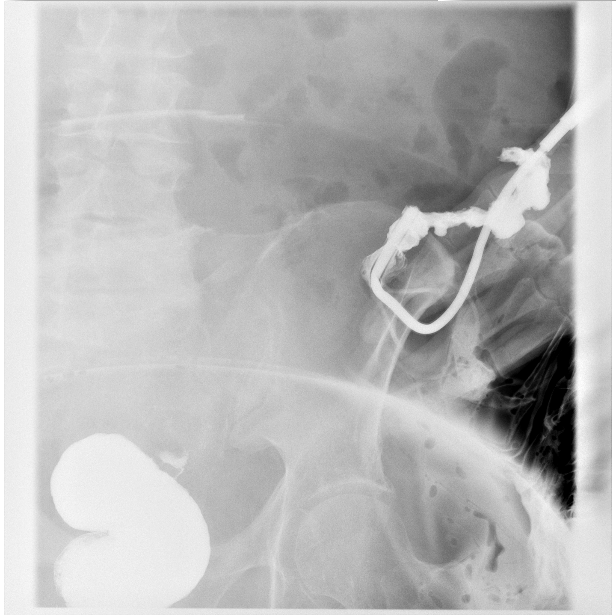

[fluoro_barium singleshot bariatric_bw (6 of 12)]
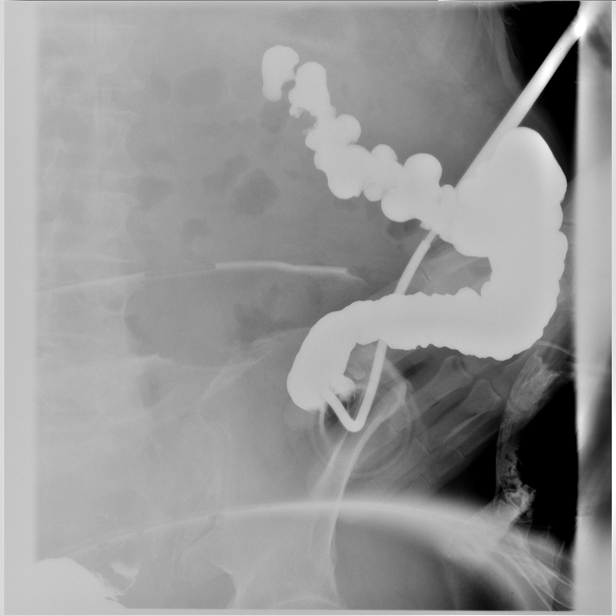

[fluoro_barium singleshot bariatric_bw (7 of 12)]
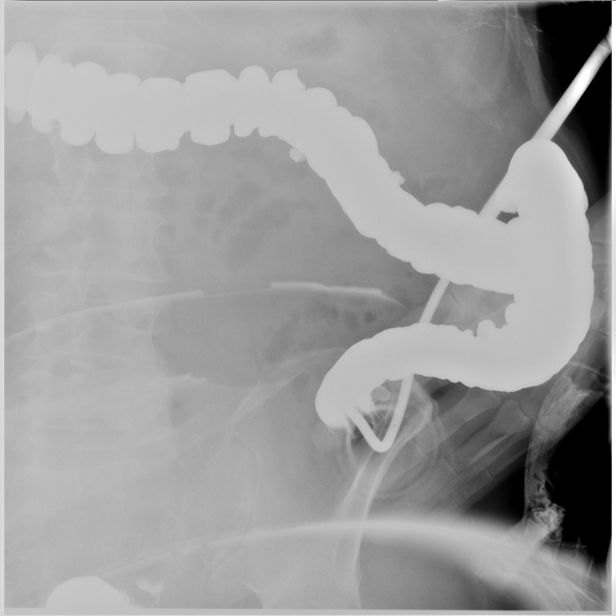

[fluoro_barium singleshot bariatric_bw (8 of 12)]
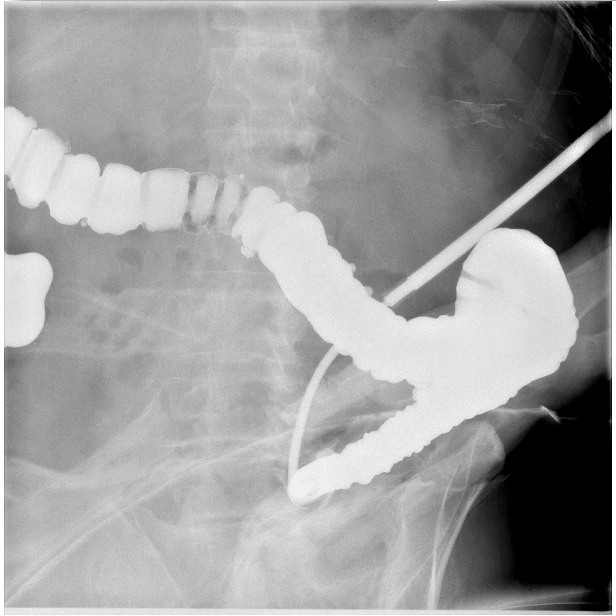

[fluoro_barium singleshot bariatric_bw (9 of 12)]
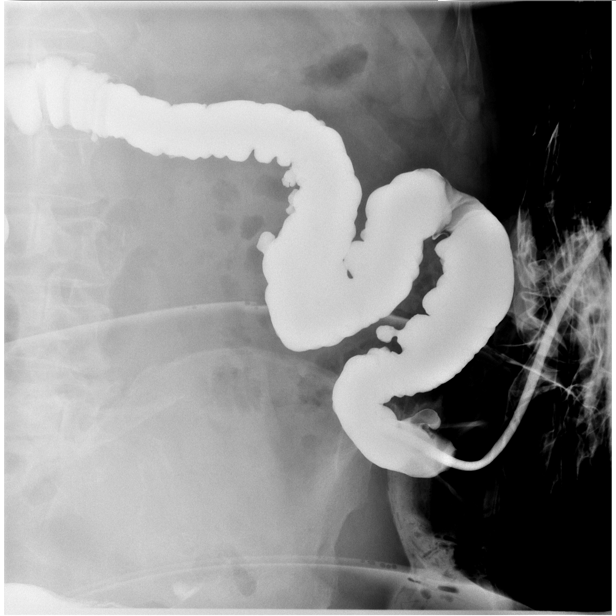

[fluoro_barium singleshot bariatric_bw (10 of 12)]
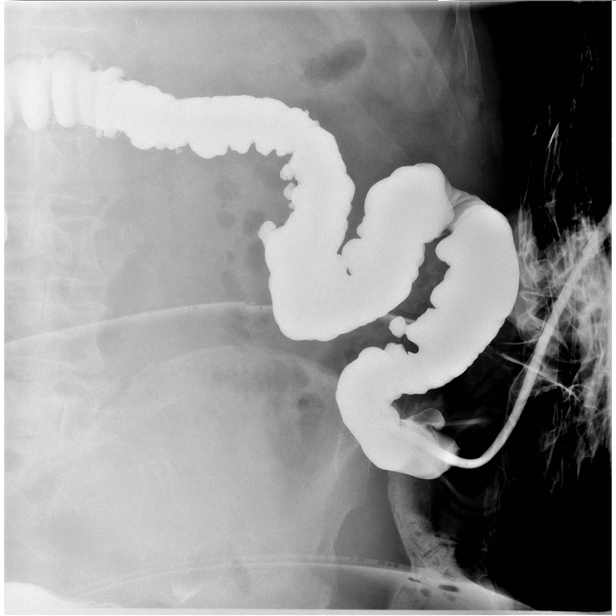

[fluoro_barium singleshot bariatric_bw (11 of 12)]
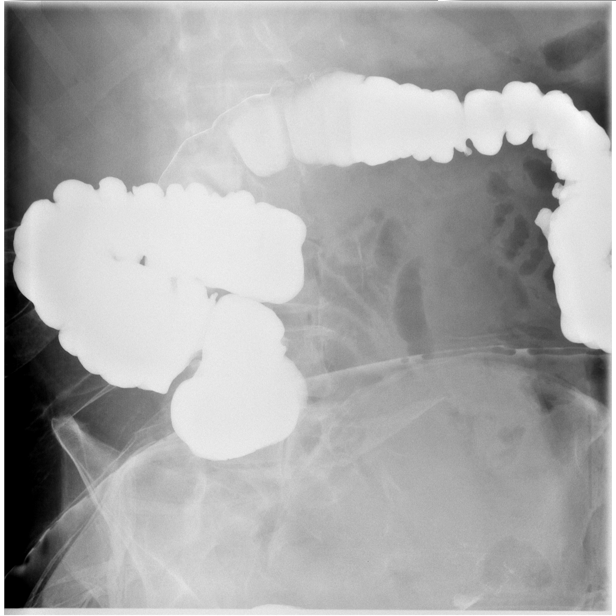

[fluoro_barium singleshot bariatric_bw (12 of 12)]
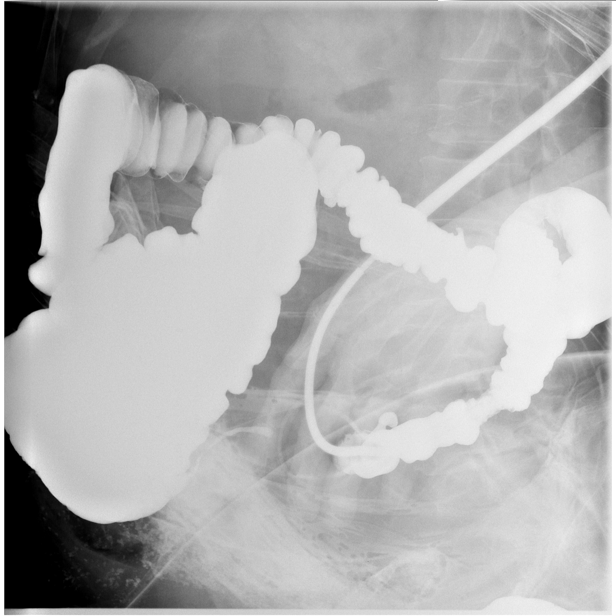

[t abdomen barium ap (1 of 2)]
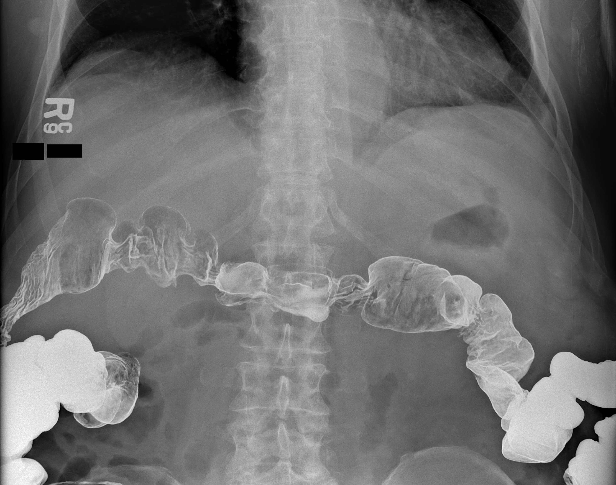

[t abdomen barium ap (2 of 2)]
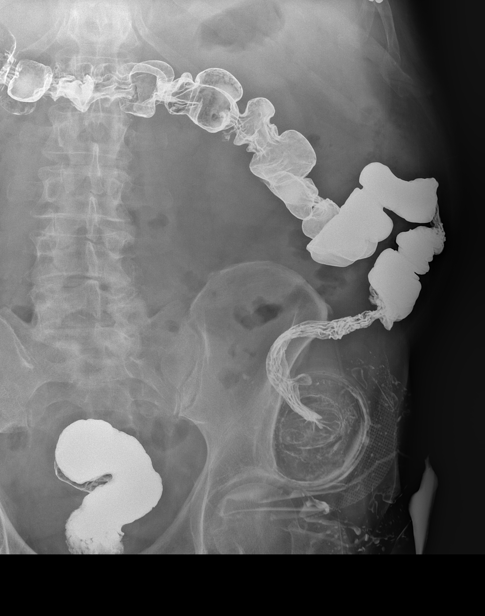

[15 of 24 positions shown; findings below may reference images not displayed]

FINDINGS: Scout film is unremarkable. Barium was introduced per rectum.
Patient had difficulty retaining barium. An approximately 4 cm
stricture is noted adjacent to the suture site of the rectosigmoid.
No intra rectal abnormalities identified. Barium was introduced at
the colostomy site. Barium further straight to the cecum and
appendix. Scattered colonic diverticulosis noted. No obstructing
abnormality identified. No stricture identified.
IMPRESSION: 1. Approximately 4 cm rectosigmoid stricture noted at the
anastomosis.

2. Colon proximal to the colostomy is widely patent. Scattered
diverticula noted.

## 2019-06-15 DIAGNOSIS — R0602 Shortness of breath: Secondary | ICD-10-CM | POA: Insufficient documentation

## 2019-06-15 DIAGNOSIS — R600 Localized edema: Secondary | ICD-10-CM | POA: Insufficient documentation

## 2019-06-15 NOTE — Progress Notes (Signed)
Patient was seen today at [facility] with Dr.Paraschos

## 2019-06-30 ENCOUNTER — Telehealth: Payer: Self-pay | Admitting: *Deleted

## 2019-06-30 NOTE — Telephone Encounter (Signed)
Spoke with patient and he has an area beside his incision (not on the incision) . Its red and and its bleeding some not much. Denies fever or chills.  Good bowel movements. No nausea or vomiting. No problems with the colostomy bag leaking. Patient added to schedule 07/01/2019 to see Dr.Pabon.

## 2019-06-30 NOTE — Telephone Encounter (Signed)
Patient called and stated that he had surgery on 03/15/19 with Dr.Pabon and now  right beside the incision site it its bleeding a little bit and red around the area. Draining reddish brown mix, he wants to talk about a nurse on this

## 2019-07-01 ENCOUNTER — Other Ambulatory Visit: Payer: Self-pay

## 2019-07-01 ENCOUNTER — Encounter: Payer: Self-pay | Admitting: Surgery

## 2019-07-01 ENCOUNTER — Ambulatory Visit (INDEPENDENT_AMBULATORY_CARE_PROVIDER_SITE_OTHER): Payer: Medicare HMO | Admitting: Surgery

## 2019-07-01 VITALS — BP 127/77 | HR 76 | Temp 97.5°F | Resp 14 | Ht 73.0 in | Wt 213.8 lb

## 2019-07-01 DIAGNOSIS — K5720 Diverticulitis of large intestine with perforation and abscess without bleeding: Secondary | ICD-10-CM | POA: Diagnosis not present

## 2019-07-01 MED ORDER — AMOXICILLIN-POT CLAVULANATE 875-125 MG PO TABS: 1.0000 | ORAL_TABLET | Freq: Two times a day (BID) | ORAL | 0 refills | Status: AC

## 2019-07-01 NOTE — Patient Instructions (Addendum)
Please remove old packing and replace new packing into the wound bed. Place a dry dressing over the area and secure with tape.    Referral sent to [facility]. Someone from their office will call to schedule an appointment within 5-7 days.   Please see your follow up appointment listed below.

## 2019-07-06 NOTE — Progress Notes (Signed)
Outpatient Surgical Follow Up  07/06/2019  Tyrone Luna is an 73 y.o. male.   Chief Complaint  Patient presents with  . Routine Post Op    bump beside incision    HPI: 73 year old male hx perforated diverticulitis requiring Hartman's 02/2019.  Recover well but he took him a while to regain his strength.  Now he comes in with a new opening area on the suprapubic area where the previous incision was.  He denies any fevers any chills.  There is some scant drainage from that area.  He is taking p.o. well.  He is having good ostomy output.  They reviewed barium enema, there is a narrowing on the sigmoid stump and this is likely from postoperative changes and will not affect the colostomy takedown.  Past Medical History:  Diagnosis Date  . Arthritis   . BPH associated with nocturia   . ED (erectile dysfunction)   . Erectile dysfunction   . Hyperlipemia   . Hypertension   . Male hypogonadism   . Sleep apnea   . Urinary frequency   . Urinary urgency     Past Surgical History:  Procedure Laterality Date  . EYE SURGERY Bilateral 2002   laser  . INGUINAL HERNIA REPAIR Left   . KNEE SURGERY Right 1995  . LAPAROTOMY N/A 03/15/2019   Procedure: EXPLORATORY LAPAROTOMY,sigmoid colectomy,colostomy;  Surgeon: Jules Husbands, MD;  Location: [facility] ORS;  Service: General;  Laterality: N/A;    Family History  Problem Relation Age of Onset  . Hypertension Other   . Prostate cancer Neg Hx   . Bladder Cancer Neg Hx   . Kidney cancer Neg Hx     Social History:  reports that he has quit smoking. He has never used smokeless tobacco. He reports that he does not drink alcohol or use drugs.  Allergies: No Known Allergies  Medications reviewed.    ROS Full ROS performed and is otherwise negative other than what is stated in HPI   BP 127/77   Pulse 76   Temp (!) 97.5 F (36.4 C) (Temporal)   Resp 14   Ht 6\' 1"  (1.854 m)   Wt 213 lb 12.8 oz (97 kg)   SpO2 97%   BMI 28.21 kg/m    Physical Exam Vitals signs and nursing note reviewed.  Constitutional:      General: He is not in acute distress.    Appearance: Normal appearance.  Eyes:     General: No scleral icterus.       Right eye: No discharge.        Left eye: No discharge.  Cardiovascular:     Rate and Rhythm: Normal rate.     Heart sounds: No murmur.  Pulmonary:     Effort: Pulmonary effort is normal. No respiratory distress.     Breath sounds: No stridor.  Abdominal:     General: Abdomen is flat. There is no distension.     Palpations: There is no mass.     Tenderness: There is no abdominal tenderness. There is no rebound.     Hernia: No hernia is present.     Comments: There is a small open wound suprapubic area measuring 2 x 1 cm.  Under sterile condition using Metzenbaums and pickups I was able to perform an excisional debridement measuring a total of 2 cm.  The fascia was intact.  Musculoskeletal: Normal range of motion.        General: No swelling.  Skin:  General: Skin is warm and dry.     Capillary Refill: Capillary refill takes less than 2 seconds.  Neurological:     General: No focal deficit present.     Mental Status: He is alert and oriented to person, place, and time.  Psychiatric:        Mood and Affect: Mood normal.        Behavior: Behavior normal.        Thought Content: Thought content normal.        Judgment: Judgment normal.         Assessment/Plan: 73 year old male with a history of Hartman's few months ago now with a new suprapubic opening and wound.  We will treat with a short course of antibiotics.  He will need colonoscopy before colostomy takedown.  We will schedule one and then see him in a few weeks so we can talk about colostomy takedown probably in the next couple months. No need for emergent surgical intervention at this time   Greater than 50% of the 25 minutes  visit was spent in counseling/coordination of care   Caroleen Hamman, MD [place]  Surgeon

## 2019-07-08 ENCOUNTER — Encounter

## 2019-07-08 ENCOUNTER — Encounter: Payer: Self-pay | Admitting: Gastroenterology

## 2019-07-08 ENCOUNTER — Other Ambulatory Visit: Payer: Self-pay

## 2019-07-08 ENCOUNTER — Ambulatory Visit (INDEPENDENT_AMBULATORY_CARE_PROVIDER_SITE_OTHER): Payer: Medicare HMO | Admitting: Gastroenterology

## 2019-07-08 VITALS — BP 137/83 | HR 66 | Temp 98.1°F | Ht 73.0 in | Wt 214.4 lb

## 2019-07-08 DIAGNOSIS — K5732 Diverticulitis of large intestine without perforation or abscess without bleeding: Secondary | ICD-10-CM

## 2019-07-08 NOTE — Progress Notes (Signed)
[name] MD, MRCP(U.K) [address]  [place]  [place], [place] [address]  Main: [phone]  Fax: [phone]   Gastroenterology Consultation  Referring Provider:     Jules Husbands, MD Primary Care Physician:  [name], MD Primary Gastroenterologist:  Dr. [name]  Reason for Consultation:     Diverticulitis        HPI:   Tyrone Luna is a 72 y.o. y/o male referred for consultation & management  by Dr. Doy Hutching, Leonie Douglas, MD.    He underwent surgery by Dr.  Dahlia Byes For a perforated diverticulitis requiring Hartman's procedure in July 2020. Past medical history of adenomatous polyps. Has been requested to perform a colonoscopy before colostomy takedown.  He says that he has been doing well since surgery.  Ready to get his ostomy taken down. Past Medical History:  Diagnosis Date   Arthritis    BPH associated with nocturia    ED (erectile dysfunction)    Erectile dysfunction    Hyperlipemia    Hypertension    Male hypogonadism    Sleep apnea    Urinary frequency    Urinary urgency     Past Surgical History:  Procedure Laterality Date   EYE SURGERY Bilateral 2002   laser   INGUINAL HERNIA REPAIR Left    KNEE SURGERY Right 1995   LAPAROTOMY N/A 03/15/2019   Procedure: EXPLORATORY LAPAROTOMY,sigmoid colectomy,colostomy;  Surgeon: Jules Husbands, MD;  Location: [facility] ORS;  Service: General;  Laterality: N/A;    Prior to Admission medications   Medication Sig Start Date End Date Taking? Authorizing Provider  amLODipine (NORVASC) 10 MG tablet Take 10 mg by mouth daily.  03/18/15   [provider]  amoxicillin-clavulanate (AUGMENTIN) 875-125 MG tablet Take 1 tablet by mouth 2 (two) times daily for 10 days. 07/01/19 07/11/19  Pabon, Marjory Lies, MD  aspirin 81 MG tablet Take 81 mg by mouth daily.    [provider]  augmented betamethasone dipropionate (DIPROLENE-AF) 0.05 % cream Apply topically. 11/27/16   [provider]  bisacodyl (DULCOLAX) 5 MG EC tablet Take all 2 tablets at 8 am the morning prior to your procedure and take 2 tablets at 10 am. 06/08/19   Pabon, [place] F, MD  cetirizine (ZYRTEC) 10 MG tablet Take 10 mg by mouth daily.    [provider]  dutasteride (AVODART) 0.5 MG capsule Take 1 capsule (0.5 mg total) by mouth daily. 03/17/18   McGowan, Hunt Oris, PA-C  EPIPEN 2-PAK 0.3 MG/0.3ML SOAJ injection  05/03/15   [provider]  fluticasone Asencion Islam) 50 MCG/ACT nasal spray  08/27/16   [provider]  Glucosamine-Chondroit-Vit C-Mn (GLUCOSAMINE 1500 COMPLEX PO) Take by mouth.    [provider]  loratadine-pseudoephedrine (CLARITIN-D 24-HOUR) 10-240 MG 24 hr tablet Take 1 tablet by mouth daily.    [provider]  Multiple Vitamins-Minerals (PRESERVISION AREDS 2 PO) Take by mouth.    [provider]  omeprazole (PRILOSEC) 40 MG capsule Take 40 mg by mouth daily. 01/22/18   [provider]  pilocarpine (SALAGEN) 5 MG tablet Take 5 mg by mouth 2 (two) times daily.  03/18/15   [provider]  polyethylene glycol powder (MIRALAX) 17 GM/SCOOP powder Mix full container in 64 ounces of Gatorade or other clear liquid. No RED or PURPLE 06/08/19   Pabon, [place] F, MD  sildenafil (REVATIO) 20 MG tablet Take 3 to 5 tablets two hours before intercouse on an  empty stomach.  Do not take with nitrates. 06/04/16   Zara Council A, PA-C  simvastatin (ZOCOR) 20 MG tablet Take 20 mg by mouth daily at 6 PM.  03/18/15   [provider]  tadalafil (CIALIS) 20 MG tablet Take 1 tablet (20 mg total) by mouth daily as needed for erectile dysfunction. 03/22/15   Hollice Espy, MD  triamcinolone cream (KENALOG) 0.5 % APPLY TOPICALLY TWO TIMES DAILY FOR UP TO 7 TO 10 DAYS. 03/13/18   [provider]  triamterene-hydrochlorothiazide (DYAZIDE) 37.5-25 MG per capsule Take 1 capsule by mouth daily.  03/18/15   [provider]    Trospium Chloride 60 MG CP24 Take 1 capsule (60 mg total) by mouth daily. 06/04/16   Nori Riis, PA-C    Family History  Problem Relation Age of Onset   Hypertension Other    Prostate cancer Neg Hx    Bladder Cancer Neg Hx    Kidney cancer Neg Hx      Social History   Tobacco Use   Smoking status: Former Smoker   Smokeless tobacco: Never Used   Tobacco comment: quit 50 years ago  Substance Use Topics   Alcohol use: No    Alcohol/week: 0.0 standard drinks   Drug use: No    Allergies as of 07/08/2019   (No Known Allergies)    Review of Systems:    All systems reviewed and negative except where noted in HPI.   Physical Exam:  There were no vitals taken for this visit. No LMP for male patient. Psych:  Alert and cooperative. Normal mood and affect. General:   Alert,  Well-developed, well-nourished, pleasant and cooperative in NAD Head:  Normocephalic and atraumatic. Eyes:  Sclera clear, no icterus.   Conjunctiva pink. Ears:  Normal auditory acuity. Nose:  No deformity, discharge, or lesions. Mouth:  No deformity or lesions,oropharynx pink & moist. Neck:  Supple; no masses or thyromegaly. Lungs:  Respirations even and unlabored.  Clear throughout to auscultation.   No wheezes, crackles, or rhonchi. No acute distress. Heart:  Regular rate and rhythm; no murmurs, clicks, rubs, or gallops. Abdomen: Central midline scar from recent surgery left lower quadrant ostomy.  Small bandage in the midline from her recent wound from the prior incision.  No tenderness, guarding, rigidity, organomegaly.  Bowel sounds are present.    Neurologic:  Alert and oriented x3;  grossly normal neurologically. Skin:  Intact without significant lesions or rashes. No jaundice. Lymph Nodes:  No significant cervical adenopathy. Psych:  Alert and cooperative. Normal mood and affect.  Imaging Studies: Dg Be (colon) Thru Colostomy W Single Cm (sol Or Thin Ba)  Result Date:  06/12/2019 CLINICAL DATA:  Sigmoid colectomy. Colostomy. Evaluation prior to reconnect surgery. EXAM: SINGLE COLUMN BARIUM ENEMA TECHNIQUE: Initial scout AP supine abdominal image obtained to insure adequate colon cleansing. Barium was introduced into the colon in a retrograde fashion and refluxed from the rectum to the cecum. Spot images of the colon followed by overhead radiographs were obtained. FLUOROSCOPY TIME:  Fluoroscopy Time:  6 minutes 18 seconds Radiation Exposure Index (if provided by the fluoroscopic device): 250.9 mGy COMPARISON:  CT 03/28/2019. FINDINGS: Scout film is unremarkable. Barium was introduced per rectum. Patient had difficulty retaining barium. An approximately 4 cm stricture is noted adjacent to the suture site of the rectosigmoid. No intra rectal abnormalities identified. Barium was introduced at the colostomy site. Barium further straight to the cecum and appendix. Scattered colonic diverticulosis noted. No obstructing abnormality  identified. No stricture identified. IMPRESSION: 1. 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 2. Colon proximal to the colostomy is widely patent. Scattered diverticula noted. Electronically Signed   By: Marcello Moores  Register   On: 06/12/2019 10:01    Assessment and Plan:   Tyrone Luna is a 73 y.o. y/o male has been referred for a colonoscopy prior to reversal of Hartman's procedure performed for perforated diverticulitis.  Plan 1.  Colonoscopy to the ostomy as well as for rectal examination.  This will be done prior to reversal of colostomy.  I have discussed alternative options, risks & benefits,  which include, but are not limited to, bleeding, infection, perforation,respiratory complication & drug reaction.  The patient agrees with this plan & written consent will be obtained.    Follow up PRN  Dr [name] MD,MRCP(U.K)

## 2019-07-14 ENCOUNTER — Other Ambulatory Visit
Admission: RE | Admit: 2019-07-14 | Discharge: 2019-07-14 | Disposition: A | Discharge status: Home / Self Care | Payer: Medicare HMO | Source: Ambulatory Visit | Attending: Gastroenterology | Admitting: Gastroenterology

## 2019-07-14 ENCOUNTER — Other Ambulatory Visit: Payer: Self-pay

## 2019-07-14 DIAGNOSIS — Z20828 Contact with and (suspected) exposure to other viral communicable diseases: Secondary | ICD-10-CM | POA: Insufficient documentation

## 2019-07-14 DIAGNOSIS — Z01812 Encounter for preprocedural laboratory examination: Secondary | ICD-10-CM | POA: Insufficient documentation

## 2019-07-14 LAB — SARS CORONAVIRUS 2 (TAT 6-24 HRS): SARS Coronavirus 2: NEGATIVE

## 2019-07-15 ENCOUNTER — Encounter: Payer: Self-pay | Admitting: Surgery

## 2019-07-15 ENCOUNTER — Ambulatory Visit (INDEPENDENT_AMBULATORY_CARE_PROVIDER_SITE_OTHER): Payer: Medicare HMO | Admitting: Surgery

## 2019-07-15 VITALS — BP 133/80 | HR 64 | Temp 97.8°F | Ht 73.0 in | Wt 215.0 lb

## 2019-07-15 DIAGNOSIS — K5720 Diverticulitis of large intestine with perforation and abscess without bleeding: Secondary | ICD-10-CM | POA: Diagnosis not present

## 2019-07-15 NOTE — Progress Notes (Signed)
Outpatient Surgical Follow Up  07/15/2019  Tyrone Luna is an 73 y.o. male.   Chief Complaint  Patient presents with  . Routine Post Op    laparotomy     HPI: 73 year old male hx perforated diverticulitis requiring Hartman's 02/2019.  Recovered well but  It is taking him a while to regain his strength. Recent cellulitis small abscess to the abd wall, responded to antibiotics. He feels stronger now  He is taking p.o. well.  He is having good ostomy output.   Past Medical History:  Diagnosis Date  . Arthritis   . BPH associated with nocturia   . ED (erectile dysfunction)   . Erectile dysfunction   . Hyperlipemia   . Hypertension   . Male hypogonadism   . Sleep apnea   . Urinary frequency   . Urinary urgency     Past Surgical History:  Procedure Laterality Date  . EYE SURGERY Bilateral 2002   laser  . INGUINAL HERNIA REPAIR Left   . KNEE SURGERY Right 1995  . LAPAROTOMY N/A 03/15/2019   Procedure: EXPLORATORY LAPAROTOMY,sigmoid colectomy,colostomy;  Surgeon: Jules Husbands, MD;  Location: [facility] ORS;  Service: General;  Laterality: N/A;    Family History  Problem Relation Age of Onset  . Hypertension Other   . Prostate cancer Neg Hx   . Bladder Cancer Neg Hx   . Kidney cancer Neg Hx     Social History:  reports that he has quit smoking. He has never used smokeless tobacco. He reports that he does not drink alcohol or use drugs.  Allergies: No Known Allergies  Medications reviewed.    ROS Full ROS performed and is otherwise negative other than what is stated in HPI   BP 133/80   Pulse 64   Temp 97.8 F (36.6 C) (Skin)   Ht 6\' 1"  (1.854 m)   Wt 215 lb (97.5 kg)   BMI 28.37 kg/m   Physical Exam Vitals signs and nursing note reviewed.  Constitutional:      Appearance: Normal appearance. He is normal weight.  Pulmonary:     Effort: Pulmonary effort is normal. No respiratory distress.     Breath sounds: No wheezing.  Abdominal:     General:  Abdomen is flat. There is no distension.     Palpations: There is no mass.     Tenderness: There is no abdominal tenderness.     Hernia: No hernia is present.     Comments: Midline scar, small 56mm wound healing well, no infection, no peritonitis, band aid placed  Skin:    General: Skin is warm and dry.     Capillary Refill: Capillary refill takes less than 2 seconds.  Neurological:     General: No focal deficit present.     Mental Status: He is alert and oriented to person, place, and time.  Psychiatric:        Mood and Affect: Mood normal.        Behavior: Behavior normal.        Thought Content: Thought content normal.        Judgment: Judgment normal.      Assessment/Plan: Resolved small abd wound abscess/cellulitis RTC December after colonoscopy for surgical discussion No surgical intervention at this time   Greater than 50% of the 15 minutes  visit was spent in counseling/coordination of care   Caroleen Hamman, MD [facility]

## 2019-07-15 NOTE — Patient Instructions (Signed)
Return as needed.The patient is aware to call back for any questions or concerns.  

## 2019-07-17 ENCOUNTER — Ambulatory Visit: Payer: Medicare HMO | Admitting: Anesthesiology

## 2019-07-17 ENCOUNTER — Ambulatory Visit
Admission: RE | Admit: 2019-07-17 | Discharge: 2019-07-17 | Disposition: A | Discharge status: Home / Self Care | Payer: Medicare HMO | Attending: Gastroenterology | Admitting: Gastroenterology

## 2019-07-17 ENCOUNTER — Encounter
Admission: RE | Disposition: A | Discharge status: Home / Self Care | Payer: Self-pay | Source: Home / Self Care | Attending: Gastroenterology

## 2019-07-17 DIAGNOSIS — Z6828 Body mass index (BMI) 28.0-28.9, adult: Secondary | ICD-10-CM | POA: Insufficient documentation

## 2019-07-17 DIAGNOSIS — E785 Hyperlipidemia, unspecified: Secondary | ICD-10-CM | POA: Diagnosis not present

## 2019-07-17 DIAGNOSIS — Z933 Colostomy status: Secondary | ICD-10-CM | POA: Insufficient documentation

## 2019-07-17 DIAGNOSIS — R351 Nocturia: Secondary | ICD-10-CM | POA: Diagnosis not present

## 2019-07-17 DIAGNOSIS — Z8719 Personal history of other diseases of the digestive system: Secondary | ICD-10-CM | POA: Insufficient documentation

## 2019-07-17 DIAGNOSIS — D124 Benign neoplasm of descending colon: Secondary | ICD-10-CM | POA: Insufficient documentation

## 2019-07-17 DIAGNOSIS — Z8249 Family history of ischemic heart disease and other diseases of the circulatory system: Secondary | ICD-10-CM | POA: Diagnosis not present

## 2019-07-17 DIAGNOSIS — M1990 Unspecified osteoarthritis, unspecified site: Secondary | ICD-10-CM | POA: Diagnosis not present

## 2019-07-17 DIAGNOSIS — K635 Polyp of colon: Secondary | ICD-10-CM | POA: Diagnosis not present

## 2019-07-17 DIAGNOSIS — Z7982 Long term (current) use of aspirin: Secondary | ICD-10-CM | POA: Insufficient documentation

## 2019-07-17 DIAGNOSIS — Z01818 Encounter for other preprocedural examination: Secondary | ICD-10-CM | POA: Insufficient documentation

## 2019-07-17 DIAGNOSIS — Z980 Intestinal bypass and anastomosis status: Secondary | ICD-10-CM | POA: Diagnosis not present

## 2019-07-17 DIAGNOSIS — G4730 Sleep apnea, unspecified: Secondary | ICD-10-CM | POA: Diagnosis not present

## 2019-07-17 DIAGNOSIS — Z79899 Other long term (current) drug therapy: Secondary | ICD-10-CM | POA: Insufficient documentation

## 2019-07-17 DIAGNOSIS — Z87891 Personal history of nicotine dependence: Secondary | ICD-10-CM | POA: Diagnosis not present

## 2019-07-17 DIAGNOSIS — K5732 Diverticulitis of large intestine without perforation or abscess without bleeding: Secondary | ICD-10-CM

## 2019-07-17 DIAGNOSIS — N401 Enlarged prostate with lower urinary tract symptoms: Secondary | ICD-10-CM | POA: Insufficient documentation

## 2019-07-17 DIAGNOSIS — I10 Essential (primary) hypertension: Secondary | ICD-10-CM | POA: Insufficient documentation

## 2019-07-17 HISTORY — PX: COLONOSCOPY WITH PROPOFOL: SHX5780

## 2019-07-17 SURGERY — COLONOSCOPY WITH PROPOFOL
Anesthesia: General

## 2019-07-17 MED ORDER — SODIUM CHLORIDE 0.9 % IV SOLN
INTRAVENOUS | Status: DC
  Administered 2019-07-17: 1000 mL via INTRAVENOUS

## 2019-07-17 MED ORDER — PROPOFOL 500 MG/50ML IV EMUL
INTRAVENOUS | Status: DC | PRN
  Administered 2019-07-17: 175 ug/kg/min via INTRAVENOUS

## 2019-07-17 MED ORDER — PHENYLEPHRINE HCL (PRESSORS) 10 MG/ML IV SOLN
INTRAVENOUS | Status: AC
  Filled 2019-07-17: qty 1

## 2019-07-17 MED ORDER — LIDOCAINE HCL (PF) 1 % IJ SOLN
2.0000 mL | Freq: Once | INTRAMUSCULAR | Status: AC
  Administered 2019-07-17: 08:00:00 0.3 mL via INTRADERMAL

## 2019-07-17 MED ORDER — LIDOCAINE HCL (CARDIAC) PF 100 MG/5ML IV SOSY
PREFILLED_SYRINGE | INTRAVENOUS | Status: DC | PRN
  Administered 2019-07-17: 50 mg via INTRAVENOUS

## 2019-07-17 MED ORDER — DEXAMETHASONE SODIUM PHOSPHATE 10 MG/ML IJ SOLN: INTRAMUSCULAR | Status: DC | PRN

## 2019-07-17 MED ORDER — PROPOFOL 10 MG/ML IV BOLUS
INTRAVENOUS | Status: DC | PRN
  Administered 2019-07-17: 50 mg via INTRAVENOUS

## 2019-07-17 MED ORDER — PROPOFOL 500 MG/50ML IV EMUL
INTRAVENOUS | Status: AC
  Filled 2019-07-17: qty 50

## 2019-07-17 MED ORDER — LIDOCAINE HCL (PF) 1 % IJ SOLN
INTRAMUSCULAR | Status: AC
  Administered 2019-07-17: 0.3 mL via INTRADERMAL
  Filled 2019-07-17: qty 2

## 2019-07-17 MED ORDER — LIDOCAINE HCL (PF) 2 % IJ SOLN
INTRAMUSCULAR | Status: AC
  Filled 2019-07-17: qty 10

## 2019-07-17 MED ORDER — EPHEDRINE SULFATE 50 MG/ML IJ SOLN
INTRAMUSCULAR | Status: AC
  Filled 2019-07-17: qty 1

## 2019-07-17 NOTE — Transfer of Care (Signed)
Immediate Anesthesia Transfer of Care Note  Patient: Tyrone Luna  Procedure(s) Performed: COLONOSCOPY WITH PROPOFOL (N/A )  Patient Location: PACU  Anesthesia Type:General  Level of Consciousness: awake and alert   Airway & Oxygen Therapy: Patient Spontanous Breathing  Post-op Assessment: Report given to RN and Post -op Vital signs reviewed and stable  Post vital signs: Reviewed and stable  Last Vitals:  Vitals Value Taken Time  BP 89/76 07/17/19 0946  Temp 36.1 C 07/17/19 0945  Pulse 60 07/17/19 0947  Resp 17 07/17/19 0947  SpO2 99 % 07/17/19 0947  Vitals shown include unvalidated device data.  Last Pain:  Vitals:   07/17/19 0945  PainSc: 0-No pain         Complications: No apparent anesthesia complications

## 2019-07-17 NOTE — Anesthesia Postprocedure Evaluation (Signed)
Anesthesia Post Note  Patient: Tyrone Luna  Procedure(s) Performed: COLONOSCOPY WITH PROPOFOL (N/A )  Patient location during evaluation: PACU Anesthesia Type: General Level of consciousness: awake and alert Pain management: pain level controlled Vital Signs Assessment: post-procedure vital signs reviewed and stable Respiratory status: spontaneous breathing, nonlabored ventilation, respiratory function stable and patient connected to nasal cannula oxygen Cardiovascular status: blood pressure returned to baseline and stable Postop Assessment: no apparent nausea or vomiting Anesthetic complications: no     Last Vitals:  Vitals:   07/17/19 0955 07/17/19 1005  BP: 119/64 139/82  Pulse: 63 (!) 56  Resp: 13 15  Temp:    SpO2: 100% 99%    Last Pain:  Vitals:   07/17/19 1005  PainSc: 0-No pain                 Molli Barrows

## 2019-07-17 NOTE — Anesthesia Procedure Notes (Addendum)
Date/Time: 07/17/2019 9:12 AM Performed by: Johnna Acosta, CRNA Pre-anesthesia Checklist: Patient identified, Emergency Drugs available, Suction available, Patient being monitored and Timeout performed Patient Re-evaluated:Patient Re-evaluated prior to induction Oxygen Delivery Method: Nasal cannula Preoxygenation: Pre-oxygenation with 100% oxygen Induction Type: IV induction

## 2019-07-17 NOTE — Anesthesia Post-op Follow-up Note (Signed)
Anesthesia QCDR form completed.        

## 2019-07-17 NOTE — Op Note (Signed)
[facility] Gastroenterology Patient Name: Tyrone Luna Procedure Date: 07/17/2019 9:10 AM MRN: [id number] Account #: [id number] Date of Birth: [birth year] Admit Type: Outpatient Age: 73 Room: [facility] ENDO ROOM 4 Gender: Male Note Status: Finalized Procedure:             Colonoscopy Indications:           Preoperative assessment Providers:             [name] MD, MD Referring MD:          Leonie Douglas. Doy Hutching, MD (Referring MD) Medicines:             Monitored Anesthesia Care Complications:         No immediate complications. Procedure:             Pre-Anesthesia Assessment:                        - Prior to the procedure, a History and Physical was                         performed, and patient medications, allergies and                         sensitivities were reviewed. The patient's tolerance                         of previous anesthesia was reviewed.                        - The risks and benefits of the procedure and the                         sedation options and risks were discussed with the                         patient. All questions were answered and informed                         consent was obtained.                        - ASA Grade Assessment: II - A patient with mild                         systemic disease.                        After obtaining informed consent, the colonoscope was                         passed under direct vision. Throughout the procedure,                         the patient's blood pressure, pulse, and oxygen                         saturations were monitored continuously. The                         Colonoscope was introduced through the transverse  colostomy and advanced to the the cecum, identified by                         the appendiceal orifice. The colonoscopy was performed                         with ease. The patient tolerated the procedure well.                         The quality  of the bowel preparation was good. Findings:      The perianal and digital rectal examinations were normal.      A 3 mm polyp was found in the descending colon. The polyp was sessile.       The polyp was removed with a cold snare. Resection and retrieval were       complete.      The exam was otherwise without abnormality on direct and retroflexion       views.      Scope passed through rectum to evaluate 25 cm of the left colon till the       suture mark. Impression:            - One 3 mm polyp in the descending colon, removed with                         a cold snare. Resected and retrieved.                        - The examination was otherwise normal on direct and                         retroflexion views.                        - Patent anastomosis, characterized by healthy                         appearing mucosa. Recommendation:        - Discharge patient to home (with escort).                        - Resume previous diet.                        - Continue present medications.                        - Await pathology results.                        - Repeat colonoscopy in 5 years for surveillance. Procedure Code(s):     --- Professional ---                        [phone], Colonoscopy through stoma; with removal of                         tumor(s), polyp(s), or other lesion(s) by snare                         technique  Diagnosis Code(s):     --- Professional ---                        K63.5, Polyp of colon                        Z98.0, Intestinal bypass and anastomosis status                        Z01.818, Encounter for other preprocedural examination CPT copyright 2019 [employer]. All rights reserved. The codes documented in this report are preliminary and upon coder review may  be revised to meet current compliance requirements. [name], MD [name] MD, MD 07/17/2019 9:40:45 AM This report has been signed electronically. Number of Addenda: 0 Note  Initiated On: 07/17/2019 9:10 AM Scope Withdrawal Time: 0 hours 17 minutes 5 seconds  Total Procedure Duration: 0 hours 20 minutes 12 seconds  Estimated Blood Loss:  Estimated blood loss: none.      [facility]

## 2019-07-17 NOTE — Anesthesia Preprocedure Evaluation (Signed)
Anesthesia Evaluation  Patient identified by MRN, date of birth, ID band Patient awake    Reviewed: Allergy & Precautions, H&P , NPO status , Patient's Chart, lab work & pertinent test results, reviewed documented beta blocker date and time   Airway Mallampati: II   Neck ROM: full    Dental  (+) Poor Dentition   Pulmonary sleep apnea , former smoker,    Pulmonary exam normal        Cardiovascular Exercise Tolerance: Poor hypertension, On Medications negative cardio ROS Normal cardiovascular exam Rhythm:regular Rate:Normal     Neuro/Psych negative neurological ROS  negative psych ROS   GI/Hepatic negative GI ROS, Neg liver ROS,   Endo/Other  negative endocrine ROS  Renal/GU negative Renal ROS  negative genitourinary   Musculoskeletal   Abdominal   Peds  Hematology negative hematology ROS (+)   Anesthesia Other Findings Past Medical History: No date: Arthritis No date: BPH associated with nocturia No date: ED (erectile dysfunction) No date: Erectile dysfunction No date: Hyperlipemia No date: Hypertension 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 No date: Sleep apnea No date: Urinary frequency No date: Urinary urgency Past Surgical History: 2002: EYE SURGERY; Bilateral     Comment:  laser No date: INGUINAL HERNIA REPAIR; Left 1995: KNEE SURGERY; Right 03/15/2019: LAPAROTOMY; N/A     Comment:  Procedure: EXPLORATORY LAPAROTOMY,sigmoid               colectomy,colostomy;  Surgeon: Jules Husbands, MD;                Location: [facility] ORS;  Service: General;  Laterality: N/A; BMI    Body Mass Index: 28.36 kg/m     Reproductive/Obstetrics negative OB ROS                             Anesthesia Physical Anesthesia Plan  ASA: III  Anesthesia Plan: General   Post-op Pain Management:    Induction:   PONV Risk Score and Plan:   Airway Management Planned:   Additional Equipment:   Intra-op  Plan:   Post-operative Plan:   Informed Consent: I have reviewed the patients History and Physical, chart, labs and discussed the procedure including the risks, benefits and alternatives for the proposed anesthesia with the patient or authorized representative who has indicated his/her understanding and acceptance.     Dental Advisory Given  Plan Discussed with: CRNA  Anesthesia Plan Comments:         Anesthesia Quick Evaluation

## 2019-07-17 NOTE — H&P (Signed)
Tyrone Bellows, MD [address] [place], [place], [place], [address] [id number], [place], [place], [place], [address] Phone: [phone]  Fax: [phone]  Primary Care Physician:  [name], MD   Pre-Procedure History & Physical: HPI:  Tyrone Luna is a 73 y.o. male is here for an colonoscopy.   Past Medical History:  Diagnosis Date  . Arthritis   . BPH associated with nocturia   . ED (erectile dysfunction)   . Erectile dysfunction   . Hyperlipemia   . Hypertension   . Male hypogonadism   . Sleep apnea   . Urinary frequency   . Urinary urgency     Past Surgical History:  Procedure Laterality Date  . EYE SURGERY Bilateral 2002   laser  . INGUINAL HERNIA REPAIR Left   . KNEE SURGERY Right 1995  . LAPAROTOMY N/A 03/15/2019   Procedure: EXPLORATORY LAPAROTOMY,sigmoid colectomy,colostomy;  Surgeon: Jules Husbands, MD;  Location: [facility] ORS;  Service: General;  Laterality: N/A;    Prior to Admission medications   Medication Sig Start Date End Date Taking? Authorizing Provider  amLODipine (NORVASC) 10 MG tablet Take 10 mg by mouth daily.  03/18/15   [provider]  aspirin 81 MG tablet Take 81 mg by mouth daily.    [provider]  augmented betamethasone dipropionate (DIPROLENE-AF) 0.05 % cream Apply topically. 11/27/16   [provider]  bisacodyl (DULCOLAX) 5 MG EC tablet Take all 2 tablets at 8 am the morning prior to your procedure and take 2 tablets at 10 am. 06/08/19   Pabon, [place] F, MD  cetirizine (ZYRTEC) 10 MG tablet Take 10 mg by mouth daily.    [provider]  dutasteride (AVODART) 0.5 MG capsule Take 1 capsule (0.5 mg total) by mouth daily. 03/17/18   McGowan, Hunt Oris, PA-C  EPIPEN 2-PAK 0.3 MG/0.3ML SOAJ injection  05/03/15   [provider]  fluticasone Asencion Islam) 50 MCG/ACT nasal spray  08/27/16   [provider]  Glucosamine-Chondroit-Vit C-Mn (GLUCOSAMINE 1500 COMPLEX PO) Take by mouth.     [provider]  loratadine-pseudoephedrine (CLARITIN-D 24-HOUR) 10-240 MG 24 hr tablet Take 1 tablet by mouth daily.    [provider]  Multiple Vitamins-Minerals (PRESERVISION AREDS 2 PO) Take by mouth.    [provider]  omeprazole (PRILOSEC) 40 MG capsule Take 40 mg by mouth daily. 01/22/18   [provider]  pilocarpine (SALAGEN) 5 MG tablet Take 5 mg by mouth 2 (two) times daily.  03/18/15   [provider]  polyethylene glycol powder (MIRALAX) 17 GM/SCOOP powder Mix full container in 64 ounces of Gatorade or other clear liquid. No RED or PURPLE 06/08/19   Pabon, [place] F, MD  sildenafil (REVATIO) 20 MG tablet Take 3 to 5 tablets two hours before intercouse on an empty stomach.  Do not take with nitrates. 06/04/16   Zara Council A, PA-C  simvastatin (ZOCOR) 20 MG tablet Take 20 mg by mouth daily at 6 PM.  03/18/15   [provider]  tadalafil (CIALIS) 20 MG tablet Take 1 tablet (20 mg total) by mouth daily as needed for erectile dysfunction. 03/22/15   Hollice Espy, MD  triamcinolone cream (KENALOG) 0.5 % APPLY TOPICALLY TWO TIMES DAILY FOR UP TO 7 TO 10 DAYS. 03/13/18   [provider]  triamterene-hydrochlorothiazide (DYAZIDE) 37.5-25 MG per capsule Take 1 capsule by mouth daily.  03/18/15   [provider]  Trospium Chloride 60  MG CP24 Take 1 capsule (60 mg total) by mouth daily. 06/04/16   Zara Council A, PA-C    Allergies as of 07/08/2019  . (No Known Allergies)    Family History  Problem Relation Age of Onset  . Hypertension Other   . Prostate cancer Neg Hx   . Bladder Cancer Neg Hx   . Kidney cancer Neg Hx     Social History   Socioeconomic History  . Marital status: Married    Spouse name: Not on file  . Number of children: Not on file  . Years of education: Not on file  . Highest education level: Not on file  Occupational History  . Not on file  Social Needs  . Financial resource strain:  Not on file  . Food insecurity    Worry: Not on file    Inability: Not on file  . Transportation needs    Medical: Not on file    Non-medical: Not on file  Tobacco Use  . Smoking status: Former Research scientist (life sciences)  . Smokeless tobacco: Never Used  . Tobacco comment: quit 50 years ago  Substance and Sexual Activity  . Alcohol use: No    Alcohol/week: 0.0 standard drinks  . Drug use: No  . Sexual activity: Yes  Lifestyle  . Physical activity    Days per week: Not on file    Minutes per session: Not on file  . Stress: Not on file  Relationships  . Social Herbalist on phone: Not on file    Gets together: Not on file    Attends religious service: Not on file    Active member of club or organization: Not on file    Attends meetings of clubs or organizations: Not on file    Relationship status: Not on file  . Intimate partner violence    Fear of current or ex partner: Not on file    Emotionally abused: Not on file    Physically abused: Not on file    Forced sexual activity: Not on file  Other Topics Concern  . Not on file  Social History Narrative  . Not on file    Review of Systems: See HPI, otherwise negative ROS  Physical Exam: BP 130/76   Pulse 63   Temp (!) 97 F (36.1 C)   Resp 16   Ht 6\' 1"  (1.854 m)   Wt 97.5 kg   SpO2 100%   BMI 28.36 kg/m  General:   Alert,  pleasant and cooperative in NAD Head:  Normocephalic and atraumatic. Neck:  Supple; no masses or thyromegaly. Lungs:  Clear throughout to auscultation, normal respiratory effort.    Heart:  +S1, +S2, Regular rate and rhythm, No edema. Abdomen:  Soft, nontender and nondistended. Normal bowel sounds, without guarding, and without rebound.   Neurologic:  Alert and  oriented x4;  grossly normal neurologically.  Impression/Plan: Tyrone Luna is here for an colonoscopy to be performed for evaluation of colostomy prior to repeat surgery : originally performed for sigmoid diverticulitis. Risks,  benefits, limitations, and alternatives regarding  colonoscopy have been reviewed with the patient.  Questions have been answered.  All parties agreeable.   Tyrone Bellows, MD  07/17/2019, 8:57 AM

## 2019-07-20 ENCOUNTER — Encounter: Payer: Self-pay | Admitting: Gastroenterology

## 2019-07-20 LAB — SURGICAL PATHOLOGY

## 2019-08-10 ENCOUNTER — Encounter: Payer: Self-pay | Admitting: Surgery

## 2019-08-10 ENCOUNTER — Other Ambulatory Visit: Payer: Self-pay

## 2019-08-10 ENCOUNTER — Telehealth: Payer: Self-pay | Admitting: *Deleted

## 2019-08-10 ENCOUNTER — Ambulatory Visit (INDEPENDENT_AMBULATORY_CARE_PROVIDER_SITE_OTHER): Payer: Medicare HMO | Admitting: Surgery

## 2019-08-10 ENCOUNTER — Other Ambulatory Visit: Payer: Self-pay | Admitting: Emergency Medicine

## 2019-08-10 VITALS — BP 136/80 | HR 75 | Temp 97.8°F | Resp 14 | Ht 73.0 in | Wt 214.8 lb

## 2019-08-10 DIAGNOSIS — Z09 Encounter for follow-up examination after completed treatment for conditions other than malignant neoplasm: Secondary | ICD-10-CM

## 2019-08-10 DIAGNOSIS — S31109A Unspecified open wound of abdominal wall, unspecified quadrant without penetration into peritoneal cavity, initial encounter: Secondary | ICD-10-CM

## 2019-08-10 MED ORDER — SULFAMETHOXAZOLE-TRIMETHOPRIM 800-160 MG PO TABS: 1.0000 | ORAL_TABLET | Freq: Two times a day (BID) | ORAL | 0 refills | Status: DC

## 2019-08-10 NOTE — Telephone Encounter (Signed)
Patient called and stated that he was here today and was suppose to get a prescription called into his pharmacy, he called his pharmacy twice and they do not have any record. He doesn't know the name of the medication. Please call and advise

## 2019-08-10 NOTE — Patient Instructions (Addendum)
CT scan scheduled for 08/12/2019 at 10:45 AM at [facility] across from [facility] in [place]. Please go to Outpatient Imaging across the road to pick up your Contrast and Instructions today. Please continue the dressing change daily. Pick up your medication at the pharmacy. Please see your return appointment.

## 2019-08-10 NOTE — Progress Notes (Signed)
Outpatient Surgical Follow Up  08/10/2019  Tyrone Luna is an 73 y.o. male.   Chief Complaint  Patient presents with  . Follow-up    EXPLORATORY LAPAROTOMY 03/15/19,sigmoid colectomy,colostomy, s/p hartmans    HPI: Tyrone Luna 57 a 73 year old male status post Jeanette Caprice procedure on July 2020 for perforated diverticulitis took a while to recover now comes in following his colonoscopy and I have personally reviewed the images showing a small polyp that it was a tubular adenoma.  There was no evidence of a stricture anywhere within his colon or rectum.  There was no evidence of any fistula.  He does have some chronic drainage from the inferior aspect of the incision.  Denies any fevers any chills.  The ostomy is working well.  Some occasional mucus via the rectum.  Past Medical History:  Diagnosis Date  . Arthritis   . BPH associated with nocturia   . ED (erectile dysfunction)   . Erectile dysfunction   . Hyperlipemia   . Hypertension   . Male hypogonadism   . Sleep apnea   . Urinary frequency   . Urinary urgency     Past Surgical History:  Procedure Laterality Date  . COLONOSCOPY WITH PROPOFOL N/A 07/17/2019   Procedure: COLONOSCOPY WITH PROPOFOL;  Surgeon: [name], MD;  Location: [facility] ENDOSCOPY;  Service: Gastroenterology;  Laterality: N/A;  . EYE SURGERY Bilateral 2002   laser  . INGUINAL HERNIA REPAIR Left   . KNEE SURGERY Right 1995  . LAPAROTOMY N/A 03/15/2019   Procedure: EXPLORATORY LAPAROTOMY,sigmoid colectomy,colostomy;  Surgeon: Jules Husbands, MD;  Location: [facility] ORS;  Service: General;  Laterality: N/A;    Family History  Problem Relation Age of Onset  . Hypertension Other   . Prostate cancer Neg Hx   . Bladder Cancer Neg Hx   . Kidney cancer Neg Hx     Social History:  reports that he has quit smoking. He has never used smokeless tobacco. He reports that he does not drink alcohol or use drugs.  Allergies: No Known Allergies  Medications  reviewed.    ROS Full ROS performed and is otherwise negative other than what is stated in HPI   BP 136/80   Pulse 75   Temp 97.8 F (36.6 C)   Resp 14   Ht 6\' 1"  (1.854 m)   Wt 214 lb 12.8 oz (97.4 kg)   SpO2 97%   BMI 28.34 kg/m   Physical Exam Vitals and nursing note reviewed. Exam conducted with a chaperone present.  Constitutional:      General: He is not in acute distress.    Appearance: He is normal weight.  Cardiovascular:     Rate and Rhythm: Normal rate.  Pulmonary:     Effort: No respiratory distress.     Breath sounds: No stridor.  Abdominal:     General: Abdomen is flat. There is no distension.     Palpations: There is no mass.     Tenderness: There is no abdominal tenderness.     Hernia: No hernia is present.     Comments: Colostomy pink and patent. Bottom end of the incision there is 3x3 mm wound. I probed with a q tip no pus, fascia intact. No peritonitis .  Skin:    General: Skin is warm and dry.     Capillary Refill: Capillary refill takes less than 2 seconds.  Neurological:     Mental Status: He is alert and oriented to person, place, and  time.  Psychiatric:        Mood and Affect: Mood normal.        Behavior: Behavior normal.        Thought Content: Thought content normal.        Judgment: Judgment normal.     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  Prior to colostomy takedown I would like to make sure there is no undrained pockets.  I do believe that this is likely a reaction to the suture material created by the knot.  I will see him next week to make sure there is no other acute processes and at that time if he continues to do well we will schedule him tentatively for a colostomy takedown in January. I will do short course of bactrim in the interim. Greater than 50% of the 25 minutes  visit was spent in counseling/coordination of care   Caroleen Hamman, MD  [place] Surgeon

## 2019-08-10 NOTE — Progress Notes (Signed)
Spoke with patient to let him know he can pick up medication at the pharmacy.

## 2019-08-10 NOTE — Progress Notes (Signed)
Bun/Creatinine order placed for Ct abdomen/pelvis.

## 2019-08-12 ENCOUNTER — Other Ambulatory Visit: Payer: Self-pay

## 2019-08-12 ENCOUNTER — Ambulatory Visit
Admission: RE | Admit: 2019-08-12 | Discharge: 2019-08-12 | Disposition: A | Discharge status: Home / Self Care | Payer: Medicare HMO | Source: Ambulatory Visit | Attending: Surgery | Admitting: Surgery

## 2019-08-12 DIAGNOSIS — Z09 Encounter for follow-up examination after completed treatment for conditions other than malignant neoplasm: Secondary | ICD-10-CM | POA: Insufficient documentation

## 2019-08-12 DIAGNOSIS — N400 Enlarged prostate without lower urinary tract symptoms: Secondary | ICD-10-CM | POA: Diagnosis not present

## 2019-08-12 DIAGNOSIS — Z933 Colostomy status: Secondary | ICD-10-CM | POA: Diagnosis not present

## 2019-08-12 IMAGING — CT CT ABD-PELV W/ CM
2 of 5 series · 15 of 46 positions shown, 17 images · IV contrast (omnipaque)
Comparison: [DATE]

CLINICAL DATA: Wound dehiscence and granuloma, history of sigmoid
colon resection with Hartmann procedure, assessment pre colostomy
reversal

EXAM:
CT ABDOMEN AND PELVIS WITH CONTRAST
TECHNIQUE: Multidetector CT imaging of the abdomen and pelvis was performed
using the standard protocol following bolus administration of
intravenous contrast. Sagittal and coronal MPR images reconstructed
from axial data set.
CONTRAST:  100mL OMNIPAQUE IOHEXOL 300 MG/ML SOLN IV. Dilute oral
contrast.

[Series 2: abd pelvis 5.00 · axial · 0.91mm/px · z∈[-1562,-1112]mm · 12 of 102 slices shown, 14 images]
[im 6/102  soft-tissue]
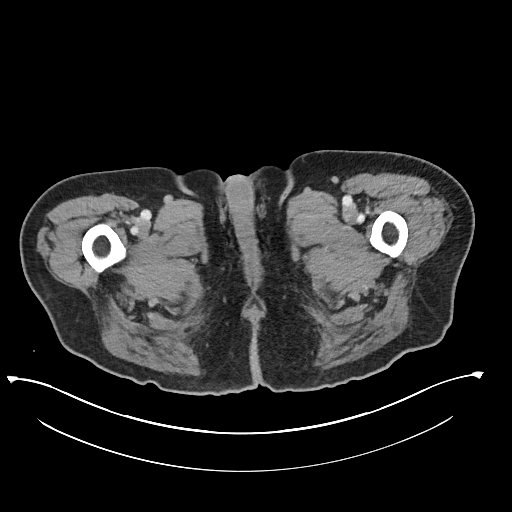
[im 6/102  bone]
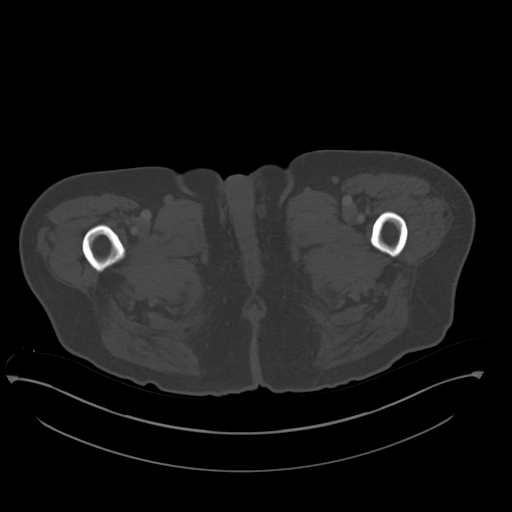
[im 16/102  soft-tissue]
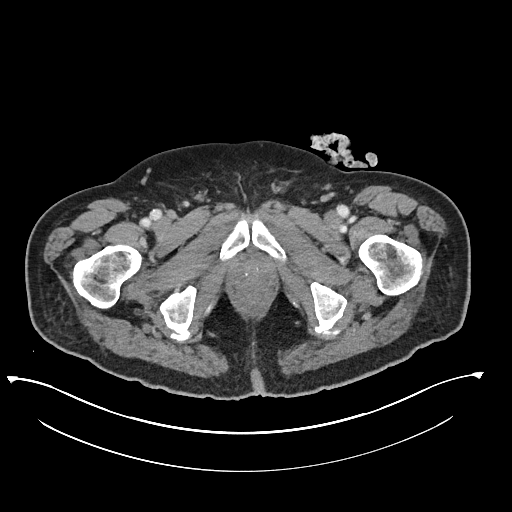
[im 22/102  soft-tissue]
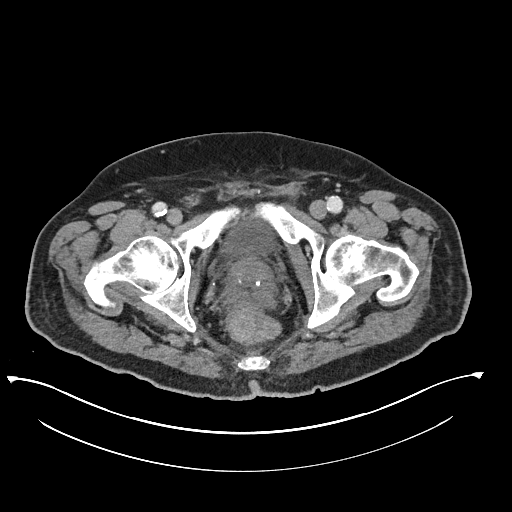
[im 32/102  soft-tissue]
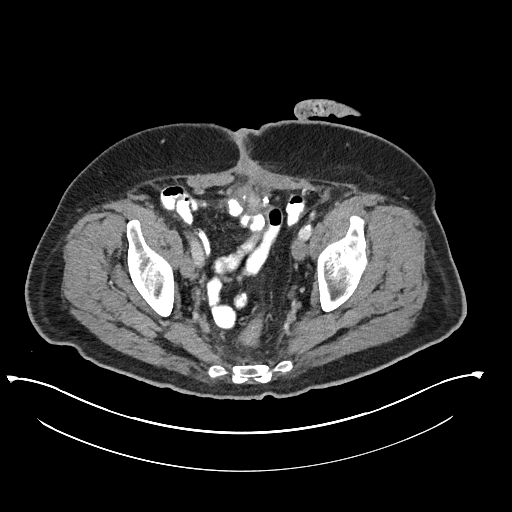
[im 38/102  soft-tissue]
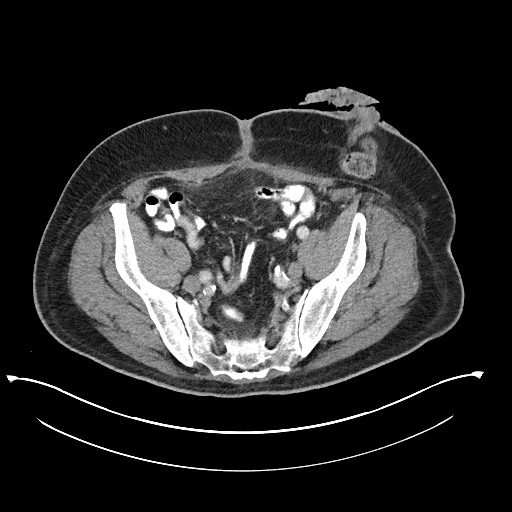
[im 48/102  soft-tissue]
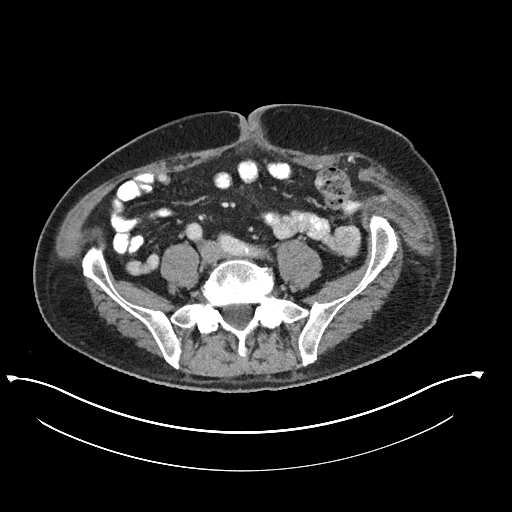
[im 54/102  soft-tissue]
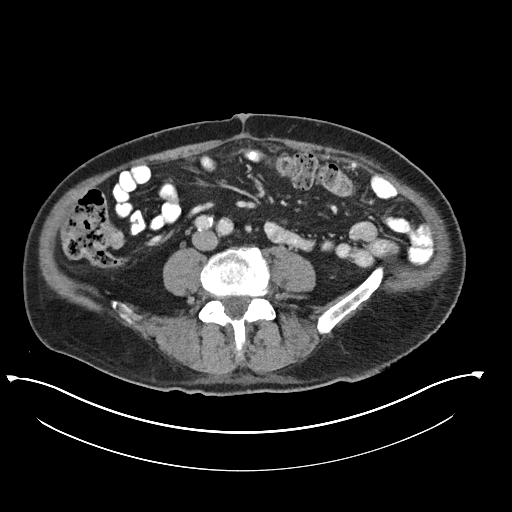
[im 64/102  soft-tissue]
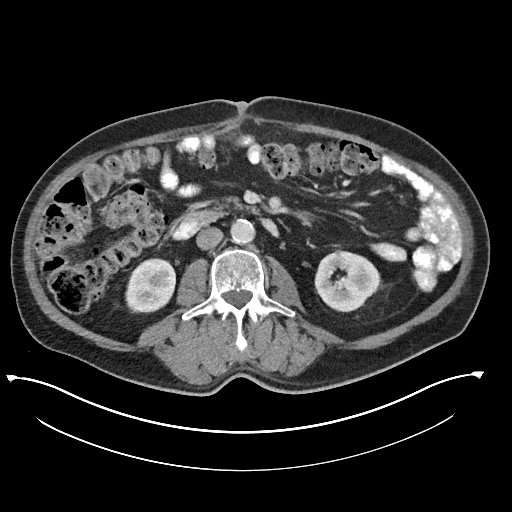
[im 70/102  soft-tissue]
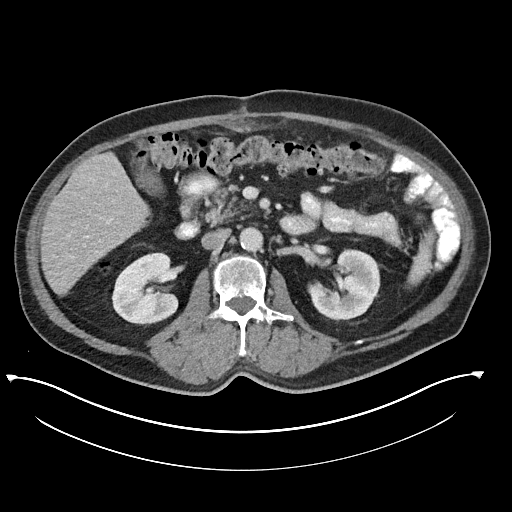
[im 70/102  bone]
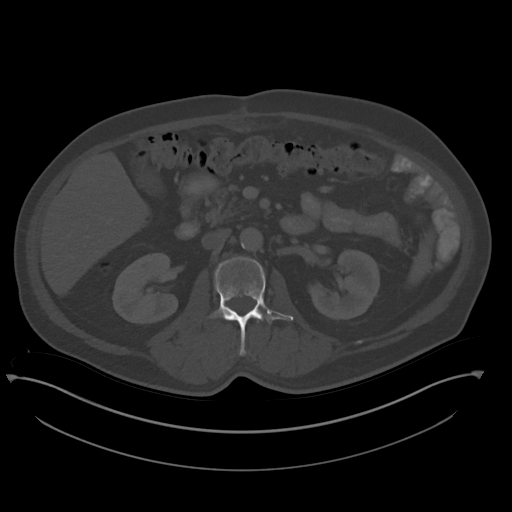
[im 80/102  soft-tissue]
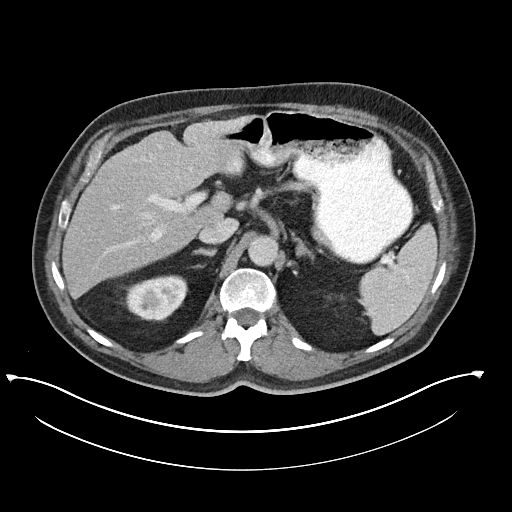
[im 86/102  soft-tissue]
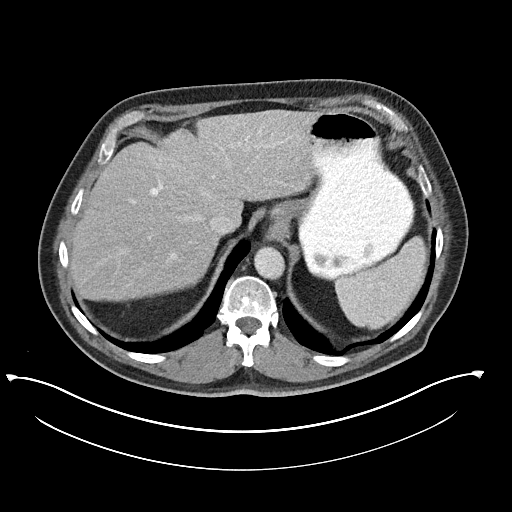
[im 96/102  soft-tissue]
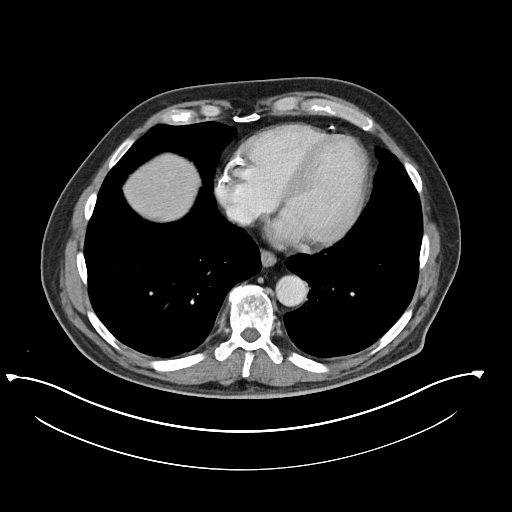

[Series 4: coronals abd pelvis 2.00 cor · coronal · 0.86mm/px · 3 of 149 slices shown]
[im 50/149  soft-tissue]
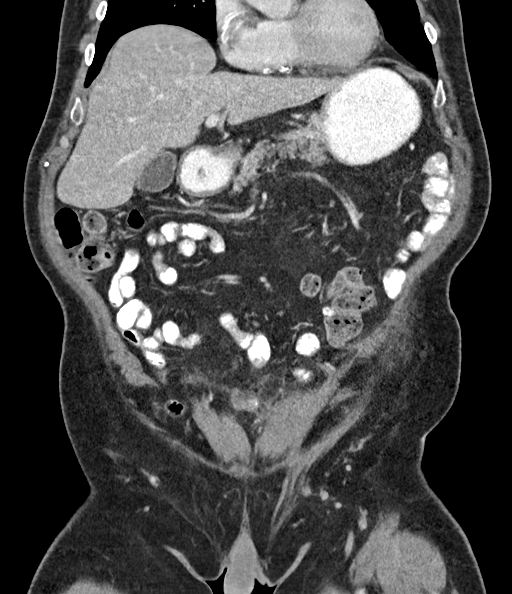
[im 66/149  soft-tissue]
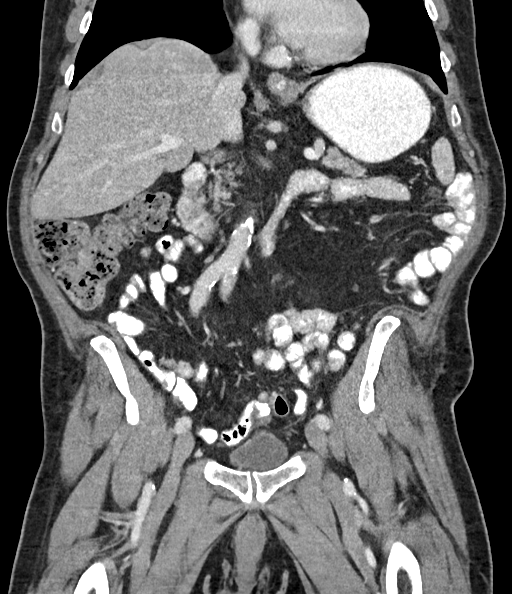
[im 83/149  soft-tissue]
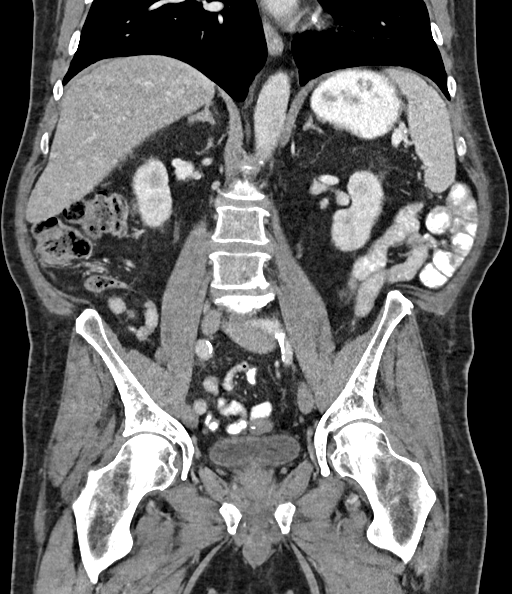

[15 of 46 positions shown; findings below may reference images not displayed]

FINDINGS: Lower chest: Lung bases clear

Hepatobiliary: Gallbladder and liver normal appearance

Pancreas: Atrophic without mass or ductal dilatation

Spleen: Normal appearance

Adrenals/Urinary Tract: Adrenal glands normal appearance. Small
BILATERAL renal cysts. Kidneys, ureters, and bladder otherwise
normal appearance.

Stomach/Bowel: Normal appendix. Stomach and small bowel loops
unremarkable. Normal appearing NYA. Cecum through
descending colon normal appearance with descending colostomy noted
in LEFT upper pelvis. Small amount of peristomal fat herniation
without peristomal bowel herniation.

Vascular/Lymphatic: Atherosclerotic calcifications aorta, iliac
arteries, coronary arteries. Aorta normal caliber. No adenopathy.

Reproductive: Mild prostatic enlargement, gland 4.7 x 3.9 cm image
82, small calcification centrally. Seminal vesicles unremarkable.

Other: No free air or free fluid. Healed ventral wound. Triangular
soft tissue identified within subcutaneous fat anterior to the
anterior abdominal wall fascia likely representing postoperative
change. No abnormal fluid collections identified within the
abdominal wall or surgical wound. Small cluster of soft tissue with
central calcifications noted intraperitoneal adjacent to the
anterior abdominal wall at the level of the umbilicus, may either
represent postsurgical scarring, fat necrosis, or granulomatous
change, area in question approximately 3.8 x 2.1 x 2.0 cm.
Additional areas of scattered stranding in anterior intra-abdominal
fat adjacent to the anterior abdominal wall are also likely related
to prior surgery. No hernia.

Musculoskeletal: Osseous demineralization. Large Schmorl's nodes at
superior L5 and S1. Discal calcification T11-T12.
IMPRESSION: Post sigmoid resection, Hartmann procedure and descending colostomy.

No bowel abnormalities identified.

Postsurgical changes at the anterior abdominal wall without evidence
of residual fluid collection or gas collection.

Soft tissue thickening with calcifications intra-abdominal in the
anterior pelvis favor postoperative changes or fat necrosis.

Mild prostatic enlargement.

No acute intra-abdominal or intrapelvic abnormalities.

## 2019-08-12 MED ORDER — IOHEXOL 300 MG/ML  SOLN
100.0000 mL | Freq: Once | INTRAMUSCULAR | Status: AC | PRN
  Administered 2019-08-12: 100 mL via INTRAVENOUS

## 2019-08-13 ENCOUNTER — Telehealth: Payer: Self-pay

## 2019-08-13 NOTE — Telephone Encounter (Signed)
Spoke with patient to notify him of recent CT results. Per Dr.Pabon advised me to inform patient "Please let the pt know his CT did not show anything concerning. He will need to keep his f/u appt." Patient verbalized understanding and says he will be here Monday for his appointment.

## 2019-08-13 NOTE — Telephone Encounter (Signed)
-----   Message from Jules Husbands, MD sent at 08/13/2019  8:58 AM EST ----- Please let the pt know his CT did not show anything concerning. HE will need to keep f/u appt.  ----- Message ----- From: Interface, Rad Results In Sent: 08/12/2019   3:05 PM EST To: Jules Husbands, MD

## 2019-08-17 ENCOUNTER — Encounter: Payer: Self-pay | Admitting: Surgery

## 2019-08-17 ENCOUNTER — Other Ambulatory Visit: Payer: Self-pay | Admitting: Emergency Medicine

## 2019-08-17 ENCOUNTER — Ambulatory Visit (INDEPENDENT_AMBULATORY_CARE_PROVIDER_SITE_OTHER): Payer: Medicare HMO | Admitting: Surgery

## 2019-08-17 ENCOUNTER — Other Ambulatory Visit: Payer: Self-pay

## 2019-08-17 VITALS — BP 147/79 | HR 61 | Temp 98.1°F | Resp 14 | Ht 73.0 in | Wt 216.4 lb

## 2019-08-17 DIAGNOSIS — K5720 Diverticulitis of large intestine with perforation and abscess without bleeding: Secondary | ICD-10-CM | POA: Diagnosis not present

## 2019-08-17 MED ORDER — BISACODYL 5 MG PO TBEC: DELAYED_RELEASE_TABLET | ORAL | 0 refills | Status: DC

## 2019-08-17 MED ORDER — NEOMYCIN SULFATE 500 MG PO TABS: ORAL_TABLET | ORAL | 0 refills | Status: DC

## 2019-08-17 MED ORDER — POLYETHYLENE GLYCOL 3350 17 GM/SCOOP PO POWD: ORAL | 0 refills | Status: DC

## 2019-08-17 MED ORDER — ERYTHROMYCIN BASE 500 MG PO TABS: ORAL_TABLET | ORAL | 0 refills | Status: DC

## 2019-08-17 NOTE — Progress Notes (Signed)
Outpatient Surgical Follow Up  08/17/2019  Tyrone Luna is an 73 y.o. male.   Chief Complaint  Patient presents with  . Follow-up    Exploratory Laporatomy Sigmoid Colectomy 02/2019 Dr. Dahlia Byes    HPI: Tyrone Luna 52 a 73 year old male status post Jeanette Caprice procedure on July 2020 for perforated diverticulitis took a while to recover now comes in following his colonoscopy and I have personally reviewed the images showing a small polyp that it was a tubular adenoma.  There was no evidence of a stricture anywhere within his colon or rectum.  There was no evidence of any fistula.    Was recently started having some drainage from the lower portion of the wound and was given antibiotics.  He now reports that there is no drainage for the last 3 days or so.  He completed antibiotics without any issue.  I did perform a CT scan that I have personally reviewed showing no evidence of drainable collection.  There is some surgical changes.  There are no other acute intra-abdominal abnormalities.  He is taking p.o. his colostomy is working well.  No fevers no chills.  He is ambulating without assistance and is strong  Past Medical History:  Diagnosis Date  . Arthritis   . BPH associated with nocturia   . ED (erectile dysfunction)   . Erectile dysfunction   . Hyperlipemia   . Hypertension   . Male hypogonadism   . Sleep apnea   . Urinary frequency   . Urinary urgency     Past Surgical History:  Procedure Laterality Date  . COLONOSCOPY WITH PROPOFOL N/A 07/17/2019   Procedure: COLONOSCOPY WITH PROPOFOL;  Surgeon: [name], MD;  Location: [facility] ENDOSCOPY;  Service: Gastroenterology;  Laterality: N/A;  . EYE SURGERY Bilateral 2002   laser  . INGUINAL HERNIA REPAIR Left   . KNEE SURGERY Right 1995  . LAPAROTOMY N/A 03/15/2019   Procedure: EXPLORATORY LAPAROTOMY,sigmoid colectomy,colostomy;  Surgeon: Jules Husbands, MD;  Location: [facility] ORS;  Service: General;  Laterality: N/A;    Family  History  Problem Relation Age of Onset  . Hypertension Other   . Prostate cancer Neg Hx   . Bladder Cancer Neg Hx   . Kidney cancer Neg Hx     Social History:  reports that he has quit smoking. He has never used smokeless tobacco. He reports that he does not drink alcohol or use drugs.  Allergies: No Known Allergies  Medications reviewed.    ROS Full ROS performed and is otherwise negative other than what is stated in HPI   BP (!) 147/79   Pulse 61   Temp 98.1 F (36.7 C) (Temporal)   Resp 14   Ht 6\' 1"  (1.854 m)   Wt 216 lb 6.4 oz (98.2 kg)   SpO2 96%   BMI 28.55 kg/m   Physical Exam Vitals and nursing note reviewed. Exam conducted with a chaperone present.  Constitutional:      General: He is not in acute distress.    Appearance: Normal appearance. He is normal weight.  Eyes:     General: No scleral icterus.       Right eye: No discharge.        Left eye: No discharge.  Cardiovascular:     Rate and Rhythm: Normal rate and regular rhythm.     Heart sounds: No murmur.  Pulmonary:     Effort: Pulmonary effort is normal. No respiratory distress.     Breath sounds:  Normal breath sounds. No stridor. No rhonchi.  Abdominal:     General: Abdomen is flat. There is no distension.     Palpations: There is no mass.     Tenderness: There is no abdominal tenderness. There is no guarding.     Hernia: No hernia is present.     Comments: Midline scar, no infection, colostomy pink and patent.  Musculoskeletal:        General: No swelling or tenderness. Normal range of motion.     Cervical back: Normal range of motion and neck supple. No rigidity.  Skin:    General: Skin is warm and dry.     Capillary Refill: Capillary refill takes less than 2 seconds.  Neurological:     General: No focal deficit present.     Mental Status: He is alert and oriented to person, place, and time.  Psychiatric:        Mood and Affect: Mood normal.        Behavior: Behavior normal.         Thought Content: Thought content normal.        Judgment: Judgment normal.         Assessment/Plan: Tyrone Luna is a 73 year old male Hartman's procedure for perforated diverticulitis now he is doing well and optimized from a medical perspective.  I do think that we can plan on doing a colostomy takedown in about 3 to 4 weeks.  Procedure discussed with the patient in detail.  Risk benefit of possible complications including but not limited to: Bleeding, infection potential repair parastomal hernia and potential creation of ostomy. He understands and wishes to proceed.  Greater than 50% of the 40 minutes  visit was spent in counseling/coordination of care   Caroleen Hamman, MD [place] Surgeon

## 2019-08-17 NOTE — Patient Instructions (Addendum)
Our Surgery scheduler will contact you within 24-48 hours to schedule your surgery.   Please have the [facility] Sheet available when she calls you.   Please pick up your medications at your pharmacy for your prep prior to Surgery.   Colostomy Reversal Surgery A colostomy reversal is a surgical procedure that is done to reverse a colostomy. In this reversal procedure, the large intestine is disconnected from the opening in the abdomen (stoma). Then, the changes that were made to the intestine during the colostomy will be reversed to restore the flow of stool through the entire intestine. Depending on the type of colostomy being reversed, this may involve one of the following:  Reconnecting the two ends of the intestine that were separated during colostomy surgery.  Closing the opening that was made in the side of the intestine to allow stool to be redirected through the stoma. After this surgery, a stoma and colostomy bag are no longer needed. Stool (feces) can leave your body through the rectum, as it did before you had a colostomy. Tell a health care provider about:  Any allergies you have.  All medicines you are taking, including vitamins, herbs, eye drops, creams, and over-the-counter medicines.  Any problems you or family members have had with anesthetic medicines.  Any blood disorders you have.  Any surgeries you have had.  Any medical conditions you have.  Whether you are pregnant or may be pregnant. What are the risks? Generally, this is a safe procedure. However, problems may occur, including:  Infection.  Bleeding.  Allergic reactions to medicines.  Damage to other structures or organs.  A temporary condition in which the intestines stop moving and working correctly (ileus). This usually goes away in 3-7 days.  A collection of pus (abscess) in the abdomen or pelvis.  Intestinal blockage.  Leaking at the area of the intestine where it was reconnected (anastomotic  leak) or where the opening of the stoma was closed.  Narrowing of the intestine (stricture) at the place where it was reconnected.  Urinary and sexual dysfunction. What happens before the procedure? Medicines Ask your health care provider about:  Changing or stopping your regular medicines. This is especially important if you are taking diabetes medicines or blood thinners.  Taking medicines such as aspirin and ibuprofen. These medicines can thin your blood. Do not take these medicines unless your health care provider tells you to take them.  Taking over-the-counter medicines, vitamins, herbs, and supplements. Staying hydrated Follow instructions from your health care provider about hydration, which may include:  Up to 2 hours before the procedure - you may continue to drink clear liquids, such as water, clear fruit juice, black coffee, and plain tea.  Eating and drinking restrictions Follow instructions from your health care provider about eating and drinking, which may include:  8 hours before the procedure - stop eating heavy meals or foods, such as meat, fried foods, or fatty foods.  6 hours before the procedure - stop eating light meals or foods, such as toast or cereal.  6 hours before the procedure - stop drinking milk or drinks that contain milk.  2 hours before the procedure - stop drinking clear liquids. General instructions  You may have an exam or testing.  Plan to have someone take you home after the procedure.  Plan to have a responsible adult care for you for at least 24 hours after you leave the hospital or clinic. This is important.  Do not use any products  that contain nicotine or tobacco, such as cigarettes, e-cigarettes, and chewing tobacco. These can delay incision healing after surgery. If you need help quitting, ask your health care provider.  Ask your health care provider what steps will be taken to help prevent infection. These may include: ? Removing  hair at the surgery site. ? Washing skin with a germ-killing soap. ? Antibiotic medicine. What happens during the procedure?   An IV will be inserted into one of your veins.  You may be given: ? A medicine to help you relax (sedative). ? A medicine to make you fall asleep (general anesthetic).  An incision will be made in your abdomen at the site of the stoma.  The large intestine will be disconnected from the abdomen at the site of the stoma.  The next steps will vary depending on the type of colostomy reversal surgery you are having. There are two main types: ? End colostomy reversal. The surgeon will use stitches (sutures) or staples to reconnect the two ends of the intestine that were separated during the end colostomy. ? Loop colostomy reversal. The surgeon will use sutures or staples to close the opening in the intestine that had been allowing stool to be redirected through the stoma. The intestine will then be put back into its normal position inside the abdomen.  The incision will be closed with sutures, skin glue, or adhesive strips. It may be covered with bandages (dressings). The procedure may vary among health care providers and hospitals. What happens after the procedure?  Your blood pressure, heart rate, breathing rate, and blood oxygen level will be monitored until you leave the hospital or clinic.  You will be given pain medicine as needed.  You will slowly increase your diet and movement as told by your health care provider. Summary  A colostomy reversal is a surgical procedure that is done to reverse a colostomy. After this surgery, stool (feces) can leave your body through the rectum, as it did before you had a colostomy.  Before the procedure, follow instructions from your health care provider about taking medicines and about eating and drinking.  During the procedure, your colostomy will be reversed, and the incision will be closed with sutures, skin glue, or  adhesive strips. It may be covered with bandages (dressings).  After the procedure, you will slowly increase your diet and movement as told by your health care provider. This information is not intended to replace advice given to you by your health care provider. Make sure you discuss any questions you have with your health care provider. Document Released: 11/05/2011 Document Revised: 01/29/2018 Document Reviewed: 01/29/2018 Elsevier Patient Education  2020 [employer].

## 2019-08-19 ENCOUNTER — Telehealth: Payer: Self-pay | Admitting: Surgery

## 2019-08-19 NOTE — Telephone Encounter (Signed)
Pt has been advised of pre admission date/time, Covid Testing date and Surgery date.  Surgery Date: 09/15/19 with Dr Ronn Melena ostomy.  Preadmission Testing Date: 09/03/19 between 8-1:00pm-Phone interview.  Covid Testing Date: 09/11/19 between 8-10:30am - patient advised to go to the [facility] ([place]  [employer] Video sent via [employer] and [employer].  Patient has been made aware to call [phone], between 1-3:00pm the day before surgery, to find out what time to arrive.    I have mailed patient his bowel prep instructions.

## 2019-09-02 ENCOUNTER — Telehealth: Payer: Self-pay | Admitting: Surgery

## 2019-09-02 NOTE — Telephone Encounter (Signed)
Pt has been advised of changes to pre admission date/time, Covid Testing date and Surgery date.  Surgery Date: 09/29/19 with Dr. Dahlia Byes - Colostomy takedown; open possible ostomy.  Preadmission Testing Date: 09/23/19 between 8-1:00pm-phone interview.  Covid Testing Date:09/25/19 between 8-10:30am - patient advised to go to the [facility] ([place]  [place] previously sent via [employer].  Patient has been made aware to call [phone], between 1-3:00pm the day before surgery, to find out what time to arrive.

## 2019-09-03 ENCOUNTER — Inpatient Hospital Stay: Admission: RE | Admit: 2019-09-03 | Payer: Medicare HMO | Source: Ambulatory Visit

## 2019-09-08 ENCOUNTER — Ambulatory Visit: Payer: Medicare HMO | Admitting: Gastroenterology

## 2019-09-11 ENCOUNTER — Other Ambulatory Visit: Payer: Medicare HMO

## 2019-09-16 ENCOUNTER — Ambulatory Visit: Payer: Medicare HMO | Admitting: Surgery

## 2019-09-16 ENCOUNTER — Other Ambulatory Visit: Payer: Self-pay

## 2019-09-16 ENCOUNTER — Encounter: Payer: Self-pay | Admitting: Surgery

## 2019-09-16 VITALS — BP 145/85 | HR 65 | Temp 95.7°F | Ht 73.0 in | Wt 221.2 lb

## 2019-09-16 DIAGNOSIS — K5720 Diverticulitis of large intestine with perforation and abscess without bleeding: Secondary | ICD-10-CM

## 2019-09-16 NOTE — Patient Instructions (Signed)
Patient is to continue to change his bandages daily as the wound is draining.   Please contact our office if you have any questions or concerns to arise.

## 2019-09-16 NOTE — Progress Notes (Signed)
Tyrone Luna is a 74 year old male well-known to me with a prior history of perforated diverticulitis requiring Hartman's.  He comes in to discuss preoperative evaluation.  He reports that he felt some scar tissue fell off his wound.  His ostomy is working well.  We had a lengthy discussion about perioperative Covid vaccination and he will try to get this as soon as possible. He is tolerating regular diet and he continues to walk without issues  PE NADu Abd: soft, nt/ keloid within midline wound w some granulation tissue. No infection, ostomy working,. No peritonitis  A/P  I would like to see me in 1 month at that time we will have a better idea on when we are allowed to do elective major surgeries that require hospitalization.  No need for emergent surgical intervention at this time Please note that I spent 25 minutes in this encounter with greater than 50% spent in coordination counseling of his care

## 2019-09-23 ENCOUNTER — Other Ambulatory Visit: Payer: Medicare HMO

## 2019-09-25 ENCOUNTER — Other Ambulatory Visit: Payer: Medicare HMO

## 2019-10-07 ENCOUNTER — Other Ambulatory Visit: Payer: Self-pay

## 2019-10-07 ENCOUNTER — Ambulatory Visit (INDEPENDENT_AMBULATORY_CARE_PROVIDER_SITE_OTHER): Payer: Medicare HMO | Admitting: Surgery

## 2019-10-07 ENCOUNTER — Encounter: Payer: Self-pay | Admitting: Surgery

## 2019-10-07 VITALS — BP 129/76 | HR 67 | Temp 97.6°F | Resp 14 | Ht 73.0 in | Wt 222.8 lb

## 2019-10-07 DIAGNOSIS — K5720 Diverticulitis of large intestine with perforation and abscess without bleeding: Secondary | ICD-10-CM

## 2019-10-07 NOTE — Progress Notes (Signed)
Outpatient Surgical Follow Up  10/07/2019  Tyrone Luna is an 74 y.o. male.   No chief complaint on file.   HPI: Tyrone Luna is a 74 year old male well-known to me with a prior history of a Hartman's procedure for perforated diverticulitis last summer.  He has recovered quite well.  He endorses no symptoms.  He is able to tolerate a regular diet, ambulate without assistance and his colostomy is working well.  No fevers no chills. He has already completed his colonoscopy and barium enema without any concerns. He does notice an outpouching next to his colostomy  Past Medical History:  Diagnosis Date  . Arthritis   . BPH associated with nocturia   . ED (erectile dysfunction)   . Erectile dysfunction   . Hyperlipemia   . Hypertension   . Male hypogonadism   . Sleep apnea   . Urinary frequency   . Urinary urgency     Past Surgical History:  Procedure Laterality Date  . COLONOSCOPY WITH PROPOFOL N/A 07/17/2019   Procedure: COLONOSCOPY WITH PROPOFOL;  Surgeon: [name], MD;  Location: [facility] ENDOSCOPY;  Service: Gastroenterology;  Laterality: N/A;  . EYE SURGERY Bilateral 2002   laser  . INGUINAL HERNIA REPAIR Left   . KNEE SURGERY Right 1995  . LAPAROTOMY N/A 03/15/2019   Procedure: EXPLORATORY LAPAROTOMY,sigmoid colectomy,colostomy;  Surgeon: Jules Husbands, MD;  Location: [facility] ORS;  Service: General;  Laterality: N/A;    Family History  Problem Relation Age of Onset  . Hypertension Other   . Prostate cancer Neg Hx   . Bladder Cancer Neg Hx   . Kidney cancer Neg Hx     Social History:  reports that he has quit smoking. He has never used smokeless tobacco. He reports that he does not drink alcohol or use drugs.  Allergies: No Known Allergies  Medications reviewed.    ROS Full ROS performed and is otherwise negative other than what is stated in HPI   There were no vitals taken for this visit.  Physical Exam Vitals and nursing note reviewed. Exam conducted  with a chaperone present.  Constitutional:      General: He is not in acute distress.    Appearance: Normal appearance. He is normal weight. He is not toxic-appearing.  Eyes:     General: No scleral icterus.       Right eye: No discharge.        Left eye: No discharge.  Cardiovascular:     Rate and Rhythm: Normal rate and regular rhythm.  Pulmonary:     Effort: Pulmonary effort is normal. No respiratory distress.     Breath sounds: Normal breath sounds. No stridor.  Abdominal:     General: Abdomen is flat. There is no distension.     Palpations: Abdomen is soft. There is no mass.     Tenderness: There is no abdominal tenderness. There is no guarding or rebound.     Hernia: No hernia is present.     Comments: Previous midline laparotomy scar. No infection. Patent colostomy, reducible parastomal hernia  Musculoskeletal:        General: No swelling, tenderness, deformity or signs of injury. Normal range of motion.     Cervical back: Normal range of motion and neck supple. No rigidity.  Skin:    General: Skin is warm and dry.     Capillary Refill: Capillary refill takes less than 2 seconds.  Neurological:     General: No focal deficit present.  Mental Status: He is alert and oriented to person, place, and time.  Psychiatric:        Mood and Affect: Mood normal.        Behavior: Behavior normal.        Thought Content: Thought content normal.        Judgment: Judgment normal.      Assessment/Plan: 74 year old male with prior history of Hartmann procedure for perforated diverticulitis now in need for colostomy takedown.  I do think that from a medical perspective he is optimized.  He is looking forward to his reversal of his colostomy.  Procedure discussed with the patient in detail.  Risk, benefits and possible implications including but not limited to: Bleeding, the need for ostomy, anastomotic leak, chronic pain and injury to adjacent structures were discussed with patient  detail.  He understands and wishes to proceed.We will address parastomal hernia during the same operative setting as well, possibility of abdominal wall reconstruction d/w the pt as well  Greater than 50% of the 65minutes  visit was spent in counseling/coordination of care   Caroleen Hamman, MD [place] Surgeon

## 2019-10-07 NOTE — H&P (View-Only) (Signed)
Outpatient Surgical Follow Up  10/07/2019  Tyrone Luna is an 74 y.o. male.   No chief complaint on file.   HPI: [name] is a 74 year old male well-known to me with a prior history of a Hartman's procedure for perforated diverticulitis last summer.  He has recovered quite well.  He endorses no symptoms.  He is able to tolerate a regular diet, ambulate without assistance and his colostomy is working well.  No fevers no chills. He has already completed his colonoscopy and barium enema without any concerns. He does notice an outpouching next to his colostomy  Past Medical History:  Diagnosis Date  . Arthritis   . BPH associated with nocturia   . ED (erectile dysfunction)   . Erectile dysfunction   . Hyperlipemia   . Hypertension   . Male hypogonadism   . Sleep apnea   . Urinary frequency   . Urinary urgency     Past Surgical History:  Procedure Laterality Date  . COLONOSCOPY WITH PROPOFOL N/A 07/17/2019   Procedure: COLONOSCOPY WITH PROPOFOL;  Surgeon: [name], MD;  Location: [facility] ENDOSCOPY;  Service: Gastroenterology;  Laterality: N/A;  . EYE SURGERY Bilateral 2002   laser  . INGUINAL HERNIA REPAIR Left   . KNEE SURGERY Right 1995  . LAPAROTOMY N/A 03/15/2019   Procedure: EXPLORATORY LAPAROTOMY,sigmoid colectomy,colostomy;  Surgeon: Jules Husbands, MD;  Location: [facility] ORS;  Service: General;  Laterality: N/A;    Family History  Problem Relation Age of Onset  . Hypertension Other   . Prostate cancer Neg Hx   . Bladder Cancer Neg Hx   . Kidney cancer Neg Hx     Social History:  reports that he has quit smoking. He has never used smokeless tobacco. He reports that he does not drink alcohol or use drugs.  Allergies: No Known Allergies  Medications reviewed.    ROS Full ROS performed and is otherwise negative other than what is stated in HPI   There were no vitals taken for this visit.  Physical Exam Vitals and nursing note reviewed. Exam conducted  with a chaperone present.  Constitutional:      General: He is not in acute distress.    Appearance: Normal appearance. He is normal weight. He is not toxic-appearing.  Eyes:     General: No scleral icterus.       Right eye: No discharge.        Left eye: No discharge.  Cardiovascular:     Rate and Rhythm: Normal rate and regular rhythm.  Pulmonary:     Effort: Pulmonary effort is normal. No respiratory distress.     Breath sounds: Normal breath sounds. No stridor.  Abdominal:     General: Abdomen is flat. There is no distension.     Palpations: Abdomen is soft. There is no mass.     Tenderness: There is no abdominal tenderness. There is no guarding or rebound.     Hernia: No hernia is present.     Comments: Previous midline laparotomy scar. No infection. Patent colostomy, reducible parastomal hernia  Musculoskeletal:        General: No swelling, tenderness, deformity or signs of injury. Normal range of motion.     Cervical back: Normal range of motion and neck supple. No rigidity.  Skin:    General: Skin is warm and dry.     Capillary Refill: Capillary refill takes less than 2 seconds.  Neurological:     General: No focal deficit present.  Mental Status: He is alert and oriented to person, place, and time.  Psychiatric:        Mood and Affect: Mood normal.        Behavior: Behavior normal.        Thought Content: Thought content normal.        Judgment: Judgment normal.      Assessment/Plan: 74 year old male with prior history of Hartmann procedure for perforated diverticulitis now in need for colostomy takedown.  I do think that from a medical perspective he is optimized.  He is looking forward to his reversal of his colostomy.  Procedure discussed with the patient in detail.  Risk, benefits and possible implications including but not limited to: Bleeding, the need for ostomy, anastomotic leak, chronic pain and injury to adjacent structures were discussed with patient  detail.  He understands and wishes to proceed.We will address parastomal hernia during the same operative setting as well, possibility of abdominal wall reconstruction d/w the pt as well  Greater than 50% of the 43minutes  visit was spent in counseling/coordination of care   Caroleen Hamman, MD [place] Surgeon

## 2019-10-07 NOTE — Patient Instructions (Addendum)
Our surgery scheduler will get in contact with you to schedule your surgery.  Please have the blue sheet available when she calls you.   Remember to pick up your medications for your Bowel Prep and the fleets enema.   Remember to do the Bowel Prep the day before your surgery.   Please call the office if you have any questions or concerns.

## 2019-10-08 ENCOUNTER — Telehealth: Payer: Self-pay | Admitting: Surgery

## 2019-10-08 NOTE — Telephone Encounter (Signed)
Pt has been advised of pre admission date/time, Covid Testing date and Surgery date.  Surgery Date: 10/13/19 Preadmission Testing Date: 10/09/19 (8a-1p) Covid Testing Date: 10/12/19 - patient advised to go to the [facility] ([place]  [employer] Video sent via [employer] and [employer].  Patient has been made aware to call [phone], between 1-3:00pm the day before surgery, to find out what time to arrive.

## 2019-10-09 ENCOUNTER — Other Ambulatory Visit: Payer: Self-pay

## 2019-10-09 ENCOUNTER — Encounter
Admission: RE | Admit: 2019-10-09 | Discharge: 2019-10-09 | Disposition: A | Discharge status: Home / Self Care | Payer: Medicare HMO | Source: Ambulatory Visit | Attending: Surgery | Admitting: Surgery

## 2019-10-09 DIAGNOSIS — R9431 Abnormal electrocardiogram [ECG] [EKG]: Secondary | ICD-10-CM | POA: Insufficient documentation

## 2019-10-09 DIAGNOSIS — Z20822 Contact with and (suspected) exposure to covid-19: Secondary | ICD-10-CM | POA: Insufficient documentation

## 2019-10-09 DIAGNOSIS — Z01818 Encounter for other preprocedural examination: Secondary | ICD-10-CM | POA: Diagnosis present

## 2019-10-09 DIAGNOSIS — R001 Bradycardia, unspecified: Secondary | ICD-10-CM | POA: Insufficient documentation

## 2019-10-09 DIAGNOSIS — I452 Bifascicular block: Secondary | ICD-10-CM | POA: Insufficient documentation

## 2019-10-09 DIAGNOSIS — I10 Essential (primary) hypertension: Secondary | ICD-10-CM | POA: Diagnosis not present

## 2019-10-09 HISTORY — DX: Malignant (primary) neoplasm, unspecified: C80.1

## 2019-10-09 HISTORY — DX: Family history of other specified conditions: Z84.89

## 2019-10-09 NOTE — Pre-Procedure Instructions (Signed)
Echo complete10/27/2020 [place] Component Name Value Ref Range  LV Ejection Fraction (%) 55   Aortic Valve Stenosis Grade none   Aortic Valve Regurgitation Grade trivial   Aortic Valve Max Velocity (m/s) 1.6 m/sec  Aortic Valve Stenosis Mean Gradient (mmHg) 5.7 mmHg  Mitral Valve Stenosis Grade none   Mitral Valve Regurgitation Grade trivial   Tricuspid Valve Regurgitation Grade mild   Tricuspid Valve Regurgitation Max Velocity (m/s) 2.2 m/sec  Right Ventricle Systolic Pressure (mmHg) [id number] mmHg  LV End Diastolic Diameter (cm) 5.2 cm  LV End Systolic Diameter (cm) 3.5 cm  LV Septum Wall Thickness (cm) 1.6 cm  LV Posterior Wall Thickness (cm) 1.2 cm  Left Atrium Diameter (cm) 4.2 cm  Result Narrative             CARDIOLOGY DEPARTMENT          [facility]                  [id number]      [facility]              Acct #: [id number]      [address] [place], [place] [address]    Date: 06/22/2019 10: 04 AM                                Adult  Male Age: 74 yrs      ECHOCARDIOGRAM REPORT               Outpatient                                [facility]    STUDY:CHEST WALL        TAPE:          MD1: PARASCHOS, ALEXANDER    ECHO:Yes  DOPPLER:Yes    FILE:          BP: 118/60 mmHg    COLOR:Yes  CONTRAST:No   MACHINE:Philips  RV BIOPSY:No     3D:No SOUND QLTY:Moderate      Height: 73 in   MEDIUM:None                       Weight: 216 lb                                BSA: 2.2 m2 _________________________________________________________________________________________        HISTORY: DOE         REASON: Assess, LV function       INDICATION: SOB  (shortness of breath) on exertion [R06.02 (ICD-10-CM)] _________________________________________________________________________________________ ECHOCARDIOGRAPHIC MEASUREMENTS 2D DIMENSIONS AORTA         Values  Normal Range  MAIN PA     Values  Normal Range        Annulus: 2.0 cm    [2.3-2.9]     PA Main: nm*    [1.5-2.1]       Aorta Sin: 3.3 cm    [3.1-3.7]  RIGHT VENTRICLE      ST Junction: nm*     [2.6-3.2]     RV Base: nm*    [<4.2]       Asc.Aorta: nm*     [2.6-3.4]  RV Mid: 3.7 cm  [< 3.5] LEFT VENTRICLE                   RV Length: nm*    [<8.6]         LVIDd: 5.2 cm    [4.2-5.9]  INFERIOR VENA CAVA         LVIDs: 3.5 cm            Max. IVC: nm*    [<=2.1]           FS: 33.2 %    [>25]      Min. IVC: nm*          SWT: 1.6 cm    [0.6-1.0]  ------------------          PWT: 1.2 cm    [0.6-1.0]  nm* - not measured LEFT ATRIUM        LA Diam: 4.2 cm    [3.0-4.0]      LA A4C Area: nm*     [<20]       LA Volume: nm*     [18-58] _________________________________________________________________________________________ ECHOCARDIOGRAPHIC DESCRIPTIONS AORTIC ROOT          Size: Normal       Dissection: INDETERM FOR DISSECTION AORTIC VALVE        Leaflets: Tricuspid          Morphology: Normal        Mobility: Fully mobile LEFT VENTRICLE          Size: Normal            Anterior: Normal      Contraction: Normal             Lateral: Normal       Closest EF: >55% (Estimated)        Septal: Normal       LV Masses: No Masses            Apical: Normal          LVH: MODERATE LVH         Inferior: Normal                            Posterior: Normal      Dias.FxClass: N/A MITRAL VALVE        Leaflets: Normal            Mobility: Fully mobile       Morphology: Normal LEFT ATRIUM          Size: MILDLY ENLARGED       LA Masses: No masses       IA Septum: Normal IAS MAIN PA          Size: Normal PULMONIC VALVE       Morphology: Normal            Mobility: Fully mobile RIGHT VENTRICLE       RV Masses: No Masses             Size: MILDLY ENLARGED       Free Wall: Normal           Contraction: Normal TRICUSPID VALVE        Leaflets: Normal            Mobility: Fully mobile       Morphology: Normal RIGHT ATRIUM          Size: Normal  RA Other: None        RA Mass: No masses PERICARDIUM         Fluid: No effusion INFERIOR VENACAVA          Size: Normal Normal respiratory collapse _________________________________________________________________________________________  DOPPLER ECHO and OTHER SPECIAL PROCEDURES         Aortic: TRIVIAL AR         No AS             158.7 cm/sec peak vel   10.1 mmHg peak grad             5.7 mmHg mean grad         Mitral: TRIVIAL MR         No MS             2.8 cm^2 by DOPPLER             MV Inflow E Vel = 53.6 cm/sec   MV Annulus E'Vel = 5.8 cm/sec             E/E'Ratio = 9.2       Tricuspid: MILD TR          No TS             224.0 cm/sec peak TR vel  23.1 mmHg peak RV pressure       Pulmonary: MILD PR          No PS             142.5 cm/sec peak vel   8.1 mmHg peak grad _________________________________________________________________________________________ INTERPRETATION NORMAL LEFT VENTRICULAR SYSTOLIC FUNCTION  WITH MODERATE LVH NORMAL RIGHT  VENTRICULAR SYSTOLIC FUNCTION MILD VALVULAR REGURGITATION (See above) NO VALVULAR STENOSIS TRIVIAL AR, MR MILD TR, PR EF >55% _________________________________________________________________________________________ Electronically signed by   MD Miquel Dunn on 06/23/2019 03: 07 PM      Performed By: Maurilio Lovely, RDCS   Ordering Physician: Isaias Cowman _________________________________________________________________________________________  Other Result Information  Interface, Text Results In - 06/23/2019  3:08 PM EDT                      CARDIOLOGY DEPARTMENT                   [facility]                                    [id number]           [facility]                           Acct #: [id number]           [address], [place], [place] [address]       Date: 06/22/2019 10: 04 AM                                                              Adult   Male  Age: 89 yrs           ECHOCARDIOGRAM REPORT  Outpatient                                                              [facility]      STUDY:CHEST WALL               TAPE:                    MD1: PARASCHOS, ALEXANDER       ECHO:Yes    DOPPLER:Yes       FILE:                    BP: 118/60 mmHg      COLOR:Yes   CONTRAST:No     MACHINE:Philips  RV BIOPSY:No          3D:No  SOUND QLTY:Moderate            Height: 73 in     MEDIUM:None                                              Weight: 216 lb                                                              BSA: 2.2 m2 _________________________________________________________________________________________               HISTORY: DOE                REASON: Assess, LV function            INDICATION: SOB (shortness of breath) on exertion [R06.02 (ICD-10-CM)] _________________________________________________________________________________________ ECHOCARDIOGRAPHIC MEASUREMENTS 2D DIMENSIONS AORTA                   Values   Normal Range   MAIN PA         Values    Normal Range               Annulus: 2.0 cm       [2.3-2.9]         PA Main: nm*       [1.5-2.1]             Aorta Sin: 3.3 cm       [3.1-3.7]    RIGHT VENTRICLE           ST Junction: nm*          [2.6-3.2]         RV Base: nm*       [<4.2]             Asc.Aorta: nm*          [2.6-3.4]          RV Mid: 3.7 cm    [< 3.5] LEFT VENTRICLE  RV Length: nm*       [<8.6]                 LVIDd: 5.2 cm       [4.2-5.9]    INFERIOR VENA CAVA                 LVIDs: 3.5 cm                        Max. IVC: nm*       [<=2.1]                    FS: 33.2 %       [>25]            Min. IVC: nm*                   SWT: 1.6 cm       [0.6-1.0]    ------------------                   PWT: 1.2 cm       [0.6-1.0]    nm* - not measured LEFT ATRIUM               LA Diam: 4.2 cm       [3.0-4.0]           LA A4C Area: nm*          [<20]             LA Volume: nm*          [18-58] _________________________________________________________________________________________ ECHOCARDIOGRAPHIC DESCRIPTIONS AORTIC ROOT                  Size: Normal            Dissection: INDETERM FOR DISSECTION AORTIC VALVE              Leaflets: Tricuspid                   Morphology: Normal              Mobility: Fully mobile LEFT VENTRICLE                  Size: Normal                        Anterior: Normal           Contraction: Normal                         Lateral: Normal            Closest EF: >55% (Estimated)                Septal: Normal             LV Masses: No Masses                       Apical: Normal                   LVH: MODERATE LVH                  Inferior: Normal  Posterior: Normal          Dias.FxClass: N/A MITRAL VALVE              Leaflets: Normal                        Mobility: Fully mobile            Morphology: Normal LEFT ATRIUM                  Size: MILDLY  ENLARGED              LA Masses: No masses             IA Septum: Normal IAS MAIN PA                  Size: Normal PULMONIC VALVE            Morphology: Normal                        Mobility: Fully mobile RIGHT VENTRICLE             RV Masses: No Masses                         Size: MILDLY ENLARGED             Free Wall: Normal                     Contraction: Normal TRICUSPID VALVE              Leaflets: Normal                        Mobility: Fully mobile            Morphology: Normal RIGHT ATRIUM                  Size: Normal                        RA Other: None               RA Mass: No masses PERICARDIUM                 Fluid: No effusion INFERIOR VENACAVA                  Size: Normal Normal respiratory collapse _________________________________________________________________________________________  DOPPLER ECHO and OTHER SPECIAL PROCEDURES                Aortic: TRIVIAL AR                 No AS                        158.7 cm/sec peak vel      10.1 mmHg peak grad                        5.7 mmHg mean grad                Mitral: TRIVIAL MR                 No MS  2.8 cm^2 by DOPPLER                        MV Inflow E Vel = 53.6 cm/sec     MV Annulus E'Vel = 5.8 cm/sec                        E/E'Ratio = 9.2             Tricuspid: MILD TR                    No TS                        224.0 cm/sec peak TR vel   23.1 mmHg peak RV pressure             Pulmonary: MILD PR                    No PS                        142.5 cm/sec peak vel      8.1 mmHg peak grad _________________________________________________________________________________________ INTERPRETATION NORMAL LEFT VENTRICULAR SYSTOLIC FUNCTION   WITH MODERATE LVH NORMAL RIGHT VENTRICULAR SYSTOLIC FUNCTION MILD VALVULAR REGURGITATION (See above) NO VALVULAR STENOSIS TRIVIAL AR, MR MILD TR, PR EF  >55% _________________________________________________________________________________________ Electronically signed by      MD Miquel Dunn on 06/23/2019 03: 07 PM          Performed By: Maurilio Lovely, RDCS    Ordering Physician: Isaias Cowman _________________________________________________________________________________________  Status Results Details   Encounter Summary

## 2019-10-09 NOTE — Patient Instructions (Signed)
Your procedure is scheduled on: 10-13-19 TUESDAY Report to Same Day Surgery 2nd floor medical mall [facility] Entrance-take elevator on left to 2nd floor.  Check in with surgery information desk.) To find out your arrival time please call [phone] between 1PM - 3PM on 10-12-19 MONDAY  Remember: Instructions that are not followed completely may result in serious medical risk, up to and including death, or upon the discretion of your surgeon and anesthesiologist your surgery may need to be rescheduled.    _x___ 1. Do not eat food after midnight the night before your procedure. NO GUM OR CANDY AFTER MIDNIGHT. You may drink clear liquids up to 2 hours before you are scheduled to arrive at the hospital for your procedure.  Do not drink clear liquids within 2 hours of your scheduled arrival to the hospital.  Clear liquids include  --Water or Apple juice without pulp  -- Gatorade  --Black Coffee or Clear Tea (No milk, no creamers, do not add anything to the coffee or Tea   ____Ensure clear carbohydrate drink on the way to the hospital for bariatric patients  ____Ensure clear carbohydrate drink 3 hours before surgery.    __x__ 2. No Alcohol for 24 hours before or after surgery.   __x__3. No Smoking or e-cigarettes for 24 prior to surgery.  Do not use any chewable tobacco products for at least 6 hour prior to surgery   ____  4. Bring all medications with you on the day of surgery if instructed.    __x__ 5. Notify your doctor if there is any change in your medical condition     (cold, fever, infections).    x___6. On the morning of surgery brush your teeth with toothpaste and water.  You may rinse your mouth with mouth wash if you wish.  Do not swallow any toothpaste or mouthwash.   Do not wear jewelry, make-up, hairpins, clips or nail polish.  Do not wear lotions, powders, or perfumes.   Do not shave 48 hours prior to surgery. Men may shave face and neck.  Do not bring valuables to  the hospital.    [facility] is not responsible for any belongings or valuables.               Contacts, dentures or bridgework may not be worn into surgery.  Leave your suitcase in the car. After surgery it may be brought to your room.  For patients admitted to the hospital, discharge time is determined by your  treatment team.  _  Patients discharged the day of surgery will not be allowed to drive home.  You will need someone to drive you home and stay with you the night of your procedure.    Please read over the following fact sheets that you were given:   [facility] Preparing for Surgery  _x___ TAKE THE FOLLOWING MEDICATION THE MORNING OF SURGERY WITH A SMALL SIP OF WATER. These include:  1. NORVASC (AMLODIPINE)  2. PRILOSEC (OMEPRAZOLE)  3. TAKE AN EXTRA OMEPRAZOLE THE NIGHT BEFORE YOUR SURGERY  4. FOLLOW BOWEL PREP INSTRUCTIONS YOU RECEIVED FROM DR PABON'S OFFICE  5.  6.  ____Fleets enema or Magnesium Citrate as directed.   _x___ Use CHG Soap or sage wipes as directed on instruction sheet   ____ Use inhalers on the day of surgery and bring to hospital day of surgery  ____ Stop Metformin and Janumet 2 days prior to surgery.    ____ Take 1/2 of usual insulin  dose the night before surgery and none on the morning surgery.   _x___ Follow recommendations from Cardiologist, Pulmonologist or PCP regarding stopping Aspirin, Coumadin, Plavix ,Eliquis, Effient, or Pradaxa, and Pletal-STOPPED ASPIRIN ON 10-07-19  X____Stop Anti-inflammatories such as Advil, Aleve, Ibuprofen, Motrin, Naproxen, Naprosyn, Goodies powders or aspirin products NOW- OK to take Tylenol   _x___ Stop supplements until after surgery-STOP GLUCOSAMINE-CHONDROITIN NOW-MAY RESUME AFTER SURGERY   ____ Bring C-Pap to the hospital.

## 2019-10-12 ENCOUNTER — Other Ambulatory Visit: Payer: Self-pay

## 2019-10-12 ENCOUNTER — Encounter
Admission: RE | Admit: 2019-10-12 | Discharge: 2019-10-12 | Disposition: A | Discharge status: Home / Self Care | Payer: Medicare HMO | Source: Ambulatory Visit | Attending: Surgery | Admitting: Surgery

## 2019-10-12 ENCOUNTER — Other Ambulatory Visit: Payer: Medicare HMO

## 2019-10-12 DIAGNOSIS — Z01818 Encounter for other preprocedural examination: Secondary | ICD-10-CM | POA: Diagnosis not present

## 2019-10-12 LAB — POTASSIUM: Potassium: 3.7 mmol/L (ref 3.5–5.1)

## 2019-10-12 MED ORDER — SODIUM CHLORIDE 0.9 % IV SOLN
2.0000 g | INTRAVENOUS | Status: AC
  Administered 2019-10-13: 2 g via INTRAVENOUS
  Filled 2019-10-12: qty 2

## 2019-10-12 NOTE — Pre-Procedure Instructions (Signed)
Pre-Admit Testing ProviderCommunication Note  Provider: Dr. Rosey Bath, Anesthesia  Notification Mode: Secure Chat  Reason: Abnormal EKG. "This EKG is changed and obviously abnormal. Surgery is tomorrow. I sent secure chat to PCP. He said if medical clearance was needed he would need an appointment and surgery would need to be postponed. Can you take a peek? Thanks! 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 Patient denies symptoms. No concerning responses.  Signed: Beulah Gandy, RN

## 2019-10-13 ENCOUNTER — Encounter: Payer: Self-pay | Admitting: Surgery

## 2019-10-13 ENCOUNTER — Inpatient Hospital Stay: Payer: Medicare HMO | Admitting: Anesthesiology

## 2019-10-13 ENCOUNTER — Inpatient Hospital Stay
Admission: RE | Admit: 2019-10-13 | Discharge: 2019-10-19 | DRG: 337 | Disposition: A | Discharge status: Home Health | Payer: Medicare HMO | Attending: Surgery | Admitting: Surgery

## 2019-10-13 ENCOUNTER — Other Ambulatory Visit: Payer: Self-pay

## 2019-10-13 ENCOUNTER — Encounter
Admission: RE | Disposition: A | Discharge status: Home Health | Payer: Self-pay | Source: Home / Self Care | Attending: Surgery

## 2019-10-13 DIAGNOSIS — N400 Enlarged prostate without lower urinary tract symptoms: Secondary | ICD-10-CM | POA: Diagnosis present

## 2019-10-13 DIAGNOSIS — Z9889 Other specified postprocedural states: Secondary | ICD-10-CM

## 2019-10-13 DIAGNOSIS — E785 Hyperlipidemia, unspecified: Secondary | ICD-10-CM | POA: Diagnosis present

## 2019-10-13 DIAGNOSIS — K435 Parastomal hernia without obstruction or  gangrene: Secondary | ICD-10-CM | POA: Diagnosis present

## 2019-10-13 DIAGNOSIS — R55 Syncope and collapse: Secondary | ICD-10-CM | POA: Diagnosis not present

## 2019-10-13 DIAGNOSIS — Z433 Encounter for attention to colostomy: Principal | ICD-10-CM

## 2019-10-13 DIAGNOSIS — I10 Essential (primary) hypertension: Secondary | ICD-10-CM | POA: Diagnosis present

## 2019-10-13 DIAGNOSIS — K5720 Diverticulitis of large intestine with perforation and abscess without bleeding: Secondary | ICD-10-CM

## 2019-10-13 DIAGNOSIS — D72829 Elevated white blood cell count, unspecified: Secondary | ICD-10-CM | POA: Diagnosis present

## 2019-10-13 DIAGNOSIS — Z87891 Personal history of nicotine dependence: Secondary | ICD-10-CM

## 2019-10-13 DIAGNOSIS — K660 Peritoneal adhesions (postprocedural) (postinfection): Secondary | ICD-10-CM | POA: Diagnosis present

## 2019-10-13 HISTORY — PX: ABDOMINAL WALL DEFECT REPAIR: SHX53

## 2019-10-13 HISTORY — PX: COLOSTOMY REVERSAL: SHX5782

## 2019-10-13 HISTORY — PX: APPENDECTOMY: SHX54

## 2019-10-13 HISTORY — PX: APPLICATION OF WOUND VAC: SHX5189

## 2019-10-13 LAB — CBC
HCT: 38.4 % — ABNORMAL LOW (ref 39.0–52.0)
Hemoglobin: 13.3 g/dL (ref 13.0–17.0)
MCH: 30.4 pg (ref 26.0–34.0)
MCHC: 34.6 g/dL (ref 30.0–36.0)
MCV: 87.9 fL (ref 80.0–100.0)
Platelets: 181 10*3/uL (ref 150–400)
RBC: 4.37 MIL/uL (ref 4.22–5.81)
RDW: 12.4 % (ref 11.5–15.5)
WBC: 15 10*3/uL — ABNORMAL HIGH (ref 4.0–10.5)
nRBC: 0 % (ref 0.0–0.2)

## 2019-10-13 LAB — CREATININE, SERUM
Creatinine, Ser: 1.25 mg/dL — ABNORMAL HIGH (ref 0.61–1.24)
GFR calc Af Amer: >60 mL/min (ref >60)
GFR calc non Af Amer: 57 mL/min — ABNORMAL LOW (ref >60)

## 2019-10-13 LAB — SARS CORONAVIRUS 2 (TAT 6-24 HRS): SARS Coronavirus 2: NEGATIVE

## 2019-10-13 SURGERY — COLOSTOMY REVERSAL
Anesthesia: General | Site: Abdomen

## 2019-10-13 MED ORDER — HYDROMORPHONE HCL 1 MG/ML IJ SOLN
INTRAMUSCULAR | Status: DC | PRN
  Administered 2019-10-13 (×4): .25 mg via INTRAVENOUS

## 2019-10-13 MED ORDER — HYDRALAZINE HCL 20 MG/ML IJ SOLN
10.0000 mg | INTRAMUSCULAR | Status: DC | PRN
  Administered 2019-10-16: 10 mg via INTRAVENOUS
  Filled 2019-10-13: qty 1

## 2019-10-13 MED ORDER — SODIUM CHLORIDE 0.9 % IV SOLN
INTRAVENOUS | Status: DC | PRN
  Administered 2019-10-13: 15 ug/min via INTRAVENOUS

## 2019-10-13 MED ORDER — FENTANYL CITRATE (PF) 100 MCG/2ML IJ SOLN
INTRAMUSCULAR | Status: AC
  Filled 2019-10-13: qty 2

## 2019-10-13 MED ORDER — KETOROLAC TROMETHAMINE 30 MG/ML IJ SOLN
INTRAMUSCULAR | Status: AC
  Filled 2019-10-13: qty 1

## 2019-10-13 MED ORDER — SUGAMMADEX SODIUM 500 MG/5ML IV SOLN
INTRAVENOUS | Status: DC | PRN
  Administered 2019-10-13: 300 mg via INTRAVENOUS

## 2019-10-13 MED ORDER — LACTATED RINGERS IV SOLN: INTRAVENOUS | Status: DC

## 2019-10-13 MED ORDER — FENTANYL CITRATE (PF) 100 MCG/2ML IJ SOLN
INTRAMUSCULAR | Status: DC | PRN
  Administered 2019-10-13: 200 ug via INTRAVENOUS

## 2019-10-13 MED ORDER — PROPOFOL 10 MG/ML IV BOLUS
INTRAVENOUS | Status: DC | PRN
  Administered 2019-10-13: 140 mg via INTRAVENOUS

## 2019-10-13 MED ORDER — ALBUMIN HUMAN 5 % IV SOLN: INTRAVENOUS | Status: DC | PRN

## 2019-10-13 MED ORDER — KETOROLAC TROMETHAMINE 15 MG/ML IJ SOLN
15.0000 mg | Freq: Four times a day (QID) | INTRAMUSCULAR | Status: AC
  Administered 2019-10-13 – 2019-10-18 (×20): 15 mg via INTRAVENOUS
  Filled 2019-10-13 (×20): qty 1

## 2019-10-13 MED ORDER — GLYCOPYRROLATE 0.2 MG/ML IJ SOLN
INTRAMUSCULAR | Status: AC
  Filled 2019-10-13: qty 1

## 2019-10-13 MED ORDER — PROCHLORPERAZINE EDISYLATE 10 MG/2ML IJ SOLN: 5.0000 mg | Freq: Four times a day (QID) | INTRAMUSCULAR | Status: DC | PRN

## 2019-10-13 MED ORDER — KETAMINE HCL 50 MG/ML IJ SOLN
INTRAMUSCULAR | Status: AC
  Filled 2019-10-13: qty 10

## 2019-10-13 MED ORDER — ACETAMINOPHEN 10 MG/ML IV SOLN: 1000.0000 mg | Freq: Once | INTRAVENOUS | Status: DC | PRN

## 2019-10-13 MED ORDER — PROPOFOL 10 MG/ML IV BOLUS
INTRAVENOUS | Status: AC
  Filled 2019-10-13: qty 40

## 2019-10-13 MED ORDER — GLYCOPYRROLATE 0.2 MG/ML IJ SOLN
INTRAMUSCULAR | Status: DC | PRN
  Administered 2019-10-13: .2 mg via INTRAVENOUS

## 2019-10-13 MED ORDER — OXYCODONE HCL 5 MG PO TABS: 5.0000 mg | ORAL_TABLET | ORAL | Status: DC | PRN

## 2019-10-13 MED ORDER — ONDANSETRON HCL 4 MG/2ML IJ SOLN: 4.0000 mg | Freq: Once | INTRAMUSCULAR | Status: DC | PRN

## 2019-10-13 MED ORDER — SODIUM CHLORIDE (PF) 0.9 % IJ SOLN
INTRAMUSCULAR | Status: AC
  Filled 2019-10-13: qty 50

## 2019-10-13 MED ORDER — ROCURONIUM BROMIDE 50 MG/5ML IV SOLN
INTRAVENOUS | Status: AC
  Filled 2019-10-13: qty 1

## 2019-10-13 MED ORDER — ACETAMINOPHEN 500 MG PO TABS
1000.0000 mg | ORAL_TABLET | Freq: Four times a day (QID) | ORAL | Status: DC
  Administered 2019-10-13 – 2019-10-19 (×22): 1000 mg via ORAL
  Filled 2019-10-13 (×23): qty 2

## 2019-10-13 MED ORDER — PHENYLEPHRINE HCL (PRESSORS) 10 MG/ML IV SOLN
INTRAVENOUS | Status: DC | PRN
  Administered 2019-10-13 (×3): 100 ug via INTRAVENOUS
  Administered 2019-10-13: 200 ug via INTRAVENOUS
  Administered 2019-10-13: 100 ug via INTRAVENOUS

## 2019-10-13 MED ORDER — LIDOCAINE HCL (PF) 2 % IJ SOLN
INTRAMUSCULAR | Status: AC
  Filled 2019-10-13: qty 5

## 2019-10-13 MED ORDER — KETOROLAC TROMETHAMINE 30 MG/ML IJ SOLN
INTRAMUSCULAR | Status: DC | PRN
  Administered 2019-10-13: 15 mg via INTRAVENOUS

## 2019-10-13 MED ORDER — CELECOXIB 200 MG PO CAPS: 200.0000 mg | ORAL_CAPSULE | ORAL | Status: AC

## 2019-10-13 MED ORDER — CHLORHEXIDINE GLUCONATE CLOTH 2 % EX PADS
6.0000 | MEDICATED_PAD | Freq: Once | CUTANEOUS | Status: AC
  Administered 2019-10-13: 6 via TOPICAL

## 2019-10-13 MED ORDER — KETAMINE HCL 50 MG/ML IJ SOLN
INTRAMUSCULAR | Status: DC | PRN
  Administered 2019-10-13 (×4): 5 mg via INTRAMUSCULAR
  Administered 2019-10-13: 10 mg via INTRAMUSCULAR

## 2019-10-13 MED ORDER — DEXAMETHASONE SODIUM PHOSPHATE 10 MG/ML IJ SOLN
INTRAMUSCULAR | Status: AC
  Filled 2019-10-13: qty 1

## 2019-10-13 MED ORDER — METHOCARBAMOL 500 MG PO TABS: 500.0000 mg | ORAL_TABLET | Freq: Four times a day (QID) | ORAL | Status: DC | PRN

## 2019-10-13 MED ORDER — PROCHLORPERAZINE MALEATE 10 MG PO TABS
10.0000 mg | ORAL_TABLET | Freq: Four times a day (QID) | ORAL | Status: DC | PRN
  Filled 2019-10-13: qty 1

## 2019-10-13 MED ORDER — PHENYLEPHRINE HCL (PRESSORS) 10 MG/ML IV SOLN
INTRAVENOUS | Status: AC
  Filled 2019-10-13: qty 1

## 2019-10-13 MED ORDER — ACETAMINOPHEN 500 MG PO TABS
ORAL_TABLET | ORAL | Status: AC
  Administered 2019-10-13: 07:00:00 1000 mg via ORAL
  Filled 2019-10-13: qty 2

## 2019-10-13 MED ORDER — SODIUM CHLORIDE 0.9 % IV SOLN: INTRAVENOUS | Status: DC

## 2019-10-13 MED ORDER — LACTATED RINGERS IV SOLN: INTRAVENOUS | Status: DC | PRN

## 2019-10-13 MED ORDER — ONDANSETRON 4 MG PO TBDP: 4.0000 mg | ORAL_TABLET | Freq: Four times a day (QID) | ORAL | Status: DC | PRN

## 2019-10-13 MED ORDER — GABAPENTIN 300 MG PO CAPS: 300.0000 mg | ORAL_CAPSULE | ORAL | Status: AC

## 2019-10-13 MED ORDER — MORPHINE SULFATE (PF) 2 MG/ML IV SOLN: 2.0000 mg | INTRAVENOUS | Status: DC | PRN

## 2019-10-13 MED ORDER — ROCURONIUM BROMIDE 100 MG/10ML IV SOLN
INTRAVENOUS | Status: DC | PRN
  Administered 2019-10-13: 50 mg via INTRAVENOUS
  Administered 2019-10-13 (×2): 20 mg via INTRAVENOUS
  Administered 2019-10-13 (×3): 10 mg via INTRAVENOUS
  Administered 2019-10-13: 5 mg via INTRAVENOUS
  Administered 2019-10-13 (×2): 20 mg via INTRAVENOUS
  Administered 2019-10-13: 10 mg via INTRAVENOUS

## 2019-10-13 MED ORDER — ALBUMIN HUMAN 5 % IV SOLN
INTRAVENOUS | Status: AC
  Filled 2019-10-13: qty 250

## 2019-10-13 MED ORDER — HYDROMORPHONE HCL 1 MG/ML IJ SOLN
INTRAMUSCULAR | Status: AC
  Filled 2019-10-13: qty 1

## 2019-10-13 MED ORDER — MIDAZOLAM HCL 2 MG/2ML IJ SOLN
INTRAMUSCULAR | Status: AC
  Filled 2019-10-13: qty 2

## 2019-10-13 MED ORDER — GABAPENTIN 300 MG PO CAPS
300.0000 mg | ORAL_CAPSULE | Freq: Three times a day (TID) | ORAL | Status: DC
  Administered 2019-10-13 – 2019-10-19 (×17): 300 mg via ORAL
  Filled 2019-10-13 (×17): qty 1

## 2019-10-13 MED ORDER — PANTOPRAZOLE SODIUM 40 MG IV SOLR
40.0000 mg | Freq: Every day | INTRAVENOUS | Status: DC
  Administered 2019-10-13 – 2019-10-18 (×6): 40 mg via INTRAVENOUS
  Filled 2019-10-13 (×6): qty 40

## 2019-10-13 MED ORDER — SODIUM CHLORIDE 0.9 % IV SOLN
2.0000 g | Freq: Three times a day (TID) | INTRAVENOUS | Status: AC
  Administered 2019-10-13 – 2019-10-14 (×2): 2 g via INTRAVENOUS
  Filled 2019-10-13 (×2): qty 2

## 2019-10-13 MED ORDER — OXYCODONE HCL 5 MG/5ML PO SOLN: 5.0000 mg | Freq: Once | ORAL | Status: DC | PRN

## 2019-10-13 MED ORDER — MIDAZOLAM HCL 2 MG/2ML IJ SOLN
INTRAMUSCULAR | Status: DC | PRN
  Administered 2019-10-13: 1 mg via INTRAVENOUS

## 2019-10-13 MED ORDER — ACETAMINOPHEN 500 MG PO TABS: 1000.0000 mg | ORAL_TABLET | ORAL | Status: AC

## 2019-10-13 MED ORDER — SODIUM CHLORIDE 0.9 % IV SOLN
2.0000 g | Freq: Once | INTRAVENOUS | Status: DC
  Filled 2019-10-13: qty 2

## 2019-10-13 MED ORDER — SODIUM CHLORIDE 0.9 % IV SOLN
INTRAVENOUS | Status: DC | PRN
  Administered 2019-10-13: 60 mL

## 2019-10-13 MED ORDER — FENTANYL CITRATE (PF) 100 MCG/2ML IJ SOLN: 25.0000 ug | INTRAMUSCULAR | Status: DC | PRN

## 2019-10-13 MED ORDER — CELECOXIB 200 MG PO CAPS
ORAL_CAPSULE | ORAL | Status: AC
  Administered 2019-10-13: 07:00:00 200 mg via ORAL
  Filled 2019-10-13: qty 1

## 2019-10-13 MED ORDER — EPHEDRINE SULFATE 50 MG/ML IJ SOLN
INTRAMUSCULAR | Status: DC | PRN
  Administered 2019-10-13 (×6): 5 mg via INTRAVENOUS

## 2019-10-13 MED ORDER — OXYCODONE HCL 5 MG PO TABS: 5.0000 mg | ORAL_TABLET | Freq: Once | ORAL | Status: DC | PRN

## 2019-10-13 MED ORDER — ALBUMIN HUMAN 5 % IV SOLN
INTRAVENOUS | Status: AC
  Filled 2019-10-13: qty 500

## 2019-10-13 MED ORDER — DEXAMETHASONE SODIUM PHOSPHATE 10 MG/ML IJ SOLN
INTRAMUSCULAR | Status: DC | PRN
  Administered 2019-10-13: 10 mg via INTRAVENOUS

## 2019-10-13 MED ORDER — SUCCINYLCHOLINE CHLORIDE 20 MG/ML IJ SOLN
INTRAMUSCULAR | Status: AC
  Filled 2019-10-13: qty 1

## 2019-10-13 MED ORDER — LIDOCAINE HCL (CARDIAC) PF 100 MG/5ML IV SOSY
PREFILLED_SYRINGE | INTRAVENOUS | Status: DC | PRN
  Administered 2019-10-13: 100 mg via INTRAVENOUS

## 2019-10-13 MED ORDER — ONDANSETRON HCL 4 MG/2ML IJ SOLN
4.0000 mg | Freq: Four times a day (QID) | INTRAMUSCULAR | Status: DC | PRN
  Administered 2019-10-16: 4 mg via INTRAVENOUS
  Filled 2019-10-13: qty 2

## 2019-10-13 MED ORDER — EPHEDRINE SULFATE 50 MG/ML IJ SOLN
INTRAMUSCULAR | Status: AC
  Filled 2019-10-13: qty 1

## 2019-10-13 MED ORDER — BUPIVACAINE-EPINEPHRINE 0.25% -1:200000 IJ SOLN
INTRAMUSCULAR | Status: DC | PRN
  Administered 2019-10-13: 30 mL

## 2019-10-13 MED ORDER — BUPIVACAINE HCL (PF) 0.25 % IJ SOLN
INTRAMUSCULAR | Status: AC
  Filled 2019-10-13: qty 30

## 2019-10-13 MED ORDER — SODIUM CHLORIDE 0.9 % IV SOLN
INTRAVENOUS | Status: DC | PRN
  Administered 2019-10-13: 2 g via INTRAVENOUS

## 2019-10-13 MED ORDER — GABAPENTIN 300 MG PO CAPS
ORAL_CAPSULE | ORAL | Status: AC
  Administered 2019-10-13: 07:00:00 300 mg via ORAL
  Filled 2019-10-13: qty 1

## 2019-10-13 MED ORDER — SUGAMMADEX SODIUM 500 MG/5ML IV SOLN
INTRAVENOUS | Status: AC
  Filled 2019-10-13: qty 5

## 2019-10-13 MED ORDER — ONDANSETRON HCL 4 MG/2ML IJ SOLN
INTRAMUSCULAR | Status: DC | PRN
  Administered 2019-10-13 (×2): 4 mg via INTRAVENOUS

## 2019-10-13 MED ORDER — ENOXAPARIN SODIUM 40 MG/0.4ML ~~LOC~~ SOLN
40.0000 mg | SUBCUTANEOUS | Status: DC
  Administered 2019-10-14 – 2019-10-19 (×6): 40 mg via SUBCUTANEOUS
  Filled 2019-10-13 (×6): qty 0.4

## 2019-10-13 MED ORDER — BUPIVACAINE LIPOSOME 1.3 % IJ SUSP
INTRAMUSCULAR | Status: AC
  Filled 2019-10-13: qty 20

## 2019-10-13 MED ORDER — ONDANSETRON HCL 4 MG/2ML IJ SOLN
INTRAMUSCULAR | Status: AC
  Filled 2019-10-13: qty 4

## 2019-10-13 SURGICAL SUPPLY — 79 items
APPLIER CLIP 11 MED OPEN (CLIP)
APPLIER CLIP 13 LRG OPEN (CLIP)
BARRIER ADH SEPRAFILM 3INX5IN (MISCELLANEOUS) ×8 IMPLANT
BLADE CLIPPER SURG (BLADE) ×4 IMPLANT
BNDG CONFORM 2 STRL LF (GAUZE/BANDAGES/DRESSINGS) IMPLANT
BRUSH SCRUB EZ  4% CHG (MISCELLANEOUS)
BRUSH SCRUB EZ 4% CHG (MISCELLANEOUS) IMPLANT
BULB RESERV EVAC DRAIN JP 100C (MISCELLANEOUS) ×4 IMPLANT
CANISTER SUCT 1200ML W/VALVE (MISCELLANEOUS) ×4 IMPLANT
CANISTER WOUND CARE 500ML ATS (WOUND CARE) ×4 IMPLANT
CATH ROBINSON RED A/P 18FR (CATHETERS) ×4 IMPLANT
CATH URET ROBINSON 16FR STRL (CATHETERS) IMPLANT
CHLORAPREP W/TINT 26 (MISCELLANEOUS) IMPLANT
CLIP APPLIE 11 MED OPEN (CLIP) IMPLANT
CLIP APPLIE 13 LRG OPEN (CLIP) IMPLANT
CONNECTOR Y WND VAC (MISCELLANEOUS) ×3 IMPLANT
COVER WAND RF STERILE (DRAPES) ×4 IMPLANT
DERMABOND ADVANCED (GAUZE/BANDAGES/DRESSINGS) ×1
DERMABOND ADVANCED .7 DNX12 (GAUZE/BANDAGES/DRESSINGS) ×3 IMPLANT
DRAIN CHANNEL JP 19F (MISCELLANEOUS) ×4 IMPLANT
DRAPE LAPAROTOMY 100X77 ABD (DRAPES) ×4 IMPLANT
DRSG OPSITE POSTOP 4X6 (GAUZE/BANDAGES/DRESSINGS) IMPLANT
DRSG TEGADERM 4X4.75 (GAUZE/BANDAGES/DRESSINGS) ×4 IMPLANT
DRSG TELFA 3X8 NADH (GAUZE/BANDAGES/DRESSINGS) IMPLANT
DRSG TELFA 4X3 1S NADH ST (GAUZE/BANDAGES/DRESSINGS) IMPLANT
ELECT BLADE 6.5 EXT (BLADE) ×4 IMPLANT
ELECT CAUTERY BLADE 6.4 (BLADE) ×4 IMPLANT
ELECT REM PT RETURN 9FT ADLT (ELECTROSURGICAL) ×4
ELECTRODE REM PT RTRN 9FT ADLT (ELECTROSURGICAL) ×3 IMPLANT
GAUZE SPONGE 4X4 12PLY STRL (GAUZE/BANDAGES/DRESSINGS) IMPLANT
GAUZE SPONGE 4X4 16PLY XRAY LF (GAUZE/BANDAGES/DRESSINGS) ×4 IMPLANT
GLOVE BIO SURGEON STRL SZ7 (GLOVE) ×8 IMPLANT
GLOVE INDICATOR 7.5 STRL GRN (GLOVE) ×4 IMPLANT
GOWN STRL REUS W/ TWL LRG LVL3 (GOWN DISPOSABLE) ×12 IMPLANT
GOWN STRL REUS W/TWL LRG LVL3 (GOWN DISPOSABLE) ×4
HANDLE SUCTION POOLE (INSTRUMENTS) ×3 IMPLANT
KIT PREVENA INCISION MGT20CM45 (CANNISTER) ×8 IMPLANT
KIT TURNOVER KIT A (KITS) ×4 IMPLANT
LABEL OR SOLS (LABEL) ×4 IMPLANT
LIGASURE IMPACT 36 18CM CVD LR (INSTRUMENTS) ×4 IMPLANT
MESH PHASIX RESORBABLE 14X14 (Mesh General) ×4 IMPLANT
NEEDLE HYPO 22GX1.5 SAFETY (NEEDLE) ×4 IMPLANT
NEEDLE HYPO 25X1 1.5 SAFETY (NEEDLE) ×8 IMPLANT
NS IRRIG 1000ML POUR BTL (IV SOLUTION) ×4 IMPLANT
NS IRRIG 500ML POUR BTL (IV SOLUTION) ×4 IMPLANT
PACK BASIN MAJOR ARMC (MISCELLANEOUS) ×4 IMPLANT
PACK BASIN MINOR ARMC (MISCELLANEOUS) ×4 IMPLANT
PACK COLON CLEAN CLOSURE (MISCELLANEOUS) ×4 IMPLANT
PLEDGET CV PTFE 7X3 (MISCELLANEOUS) IMPLANT
RELOAD PROXIMATE 75MM BLUE (ENDOMECHANICALS) ×8 IMPLANT
SPONGE DRAIN TRACH 4X4 STRL 2S (GAUZE/BANDAGES/DRESSINGS) ×4 IMPLANT
SPONGE KITTNER 5P (MISCELLANEOUS) ×8 IMPLANT
SPONGE LAP 18X18 RF (DISPOSABLE) ×16 IMPLANT
SPONGE LAP 18X36 RFD (DISPOSABLE) ×4 IMPLANT
STAPLER PROX 25M (MISCELLANEOUS) ×4 IMPLANT
STAPLER PROXIMATE 75MM BLUE (STAPLE) ×4 IMPLANT
STAPLER SKIN PROX 35W (STAPLE) ×4 IMPLANT
SUCTION POOLE HANDLE (INSTRUMENTS) ×4
SUT CHROMIC 4 0 RB 1X27 (SUTURE) IMPLANT
SUT CHROMIC BR 1/2CLE 2-0 54IN (SUTURE) IMPLANT
SUT ETHILON 3-0 FS-10 30 BLK (SUTURE) ×4
SUT PDS AB 0 CT1 27 (SUTURE) ×20 IMPLANT
SUT PDS PLUS 0 (SUTURE) ×5
SUT PDS PLUS AB 0 CT-2 (SUTURE) ×15 IMPLANT
SUT SILK 2 0 SH CR/8 (SUTURE) ×4 IMPLANT
SUT SILK 2 0SH CR/8 30 (SUTURE) ×4 IMPLANT
SUT SURGILON 0 BLK (SUTURE) IMPLANT
SUT VIC AB 2-0 SH 27 (SUTURE) ×5
SUT VIC AB 2-0 SH 27XBRD (SUTURE) ×15 IMPLANT
SUT VIC AB 3-0 SH 27 (SUTURE)
SUT VIC AB 3-0 SH 27X BRD (SUTURE) IMPLANT
SUTURE EHLN 3-0 FS-10 30 BLK (SUTURE) ×3 IMPLANT
SYR 10ML LL (SYRINGE) ×8 IMPLANT
SYR 20ML LL LF (SYRINGE) ×8 IMPLANT
SYR BULB IRRIG 60ML STRL (SYRINGE) ×4 IMPLANT
SYRINGE IRR TOOMEY STRL 70CC (SYRINGE) ×4 IMPLANT
TAPE TRANSPORE STRL 2 31045 (GAUZE/BANDAGES/DRESSINGS) ×4 IMPLANT
TRAY FOLEY MTR SLVR 16FR STAT (SET/KITS/TRAYS/PACK) ×4 IMPLANT
WND VAC CONN Y (MISCELLANEOUS) ×1

## 2019-10-13 NOTE — Interval H&P Note (Signed)
History and Physical Interval Note:  10/13/2019 7:18 AM  Tyrone Luna  has presented today for surgery, with the diagnosis of colostomy.  The various methods of treatment have been discussed with the patient and family. After consideration of risks, benefits and other options for treatment, the patient has consented to  Procedure(s): COLOSTOMY REVERSAL-colostomy takedown-open possible ostomy (N/A) HERNIA REPAIR PARASTOMAL (N/A) as a surgical intervention.  The patient's history has been reviewed, patient examined, no change in status, stable for surgery.  I have reviewed the patient's chart and labs.  Questions were answered to the patient's satisfaction.     [place]

## 2019-10-13 NOTE — Op Note (Addendum)
PROCEDURES: 1.Extensive lysis of adhesions 2. Colostomy takedown EEA stapled anastomosis 60mm 3. Excisional debridement abdominal wall measuring 30x3 cm= 90cm2 including fascia 4. Appendectomy 5. Abdominal wall reconstruction  ( ventral hernia repair ) with bilateral Myocutaneous flaps, TAR release On the left and Reeves-Stoppa on the Right. Using 35x30 cms Phasix Mesh 6. Placement of Prevena wound VAC 90cm2 7. 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 with endotracheal tube  Findings: 1. 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 Dense adhesions from the omentum to the abdominal wall  Estimated Blood Loss: 150cc         Drains: 19 Blake retrorectus space.         Specimens: End colostomy and Subq tissue including fascia       Complications: None              Condition: stable  Procedure Details  The patient was seen again in the Holding Room. The benefits, complications, treatment options, and expected outcomes were discussed with the patient. The risks of bleeding, infection, recurrence of symptoms, failure to resolve symptoms,  bowel injury, any of which could require further surgery were reviewed with the patient.   The patient was taken to Operating Room, identified as Tyrone Luna and the procedure verified.  A Time Out was held and the above information confirmed.  Prior to the induction of general anesthesia, antibiotic prophylaxis was administered. VTE prophylaxis was in place. General endotracheal anesthesia was then administered and tolerated well. 2-0 silk was used to pursestring the end colostomy.  After  the induction, the abdomen was prepped with Chloraprep and draped in the sterile fashion. The patient was positioned in lithotomy position.  Elliptical incision was created in the end colostomy with a 15 blade knife and electrocautery was used to dissect through subcutaneous tissue.  Stomal hernia was encountered I were able to free up the colon from the abdominal wall in the standard fashion.  We proceeded to divided the end colostomy and with regular 75 GIA stapler.  Attention then was placed to the midline of the abdomen.  Due to chronic inflammatory response and the scar tissue I decided to perform an excisional debridement this was using a 10 blade knife to create an elliptical incision we use our electrocautery to dissect through subcutaneous tissue and the excised some of the scar tissue to include the midline fascia to freshen the edges of the fascia.  Were extremely careful open opening the abdominal cavity and we did in a very systematic and cautious fashion.  We will start our incision of the abdominal cavity on the subxiphoid area.  Encountered dense adhesions from the ventral to the abdominal wall and also small bowel to the abdominal wall.  Were able to lyse adhesions with a combination of cautery and Metzenbaum scissors.  Please note that we spent at least 1 hour lysing adhesions.  Once we were able to restore the appropriate anatomy we made sure that the ascending colon had adequate perfusion.  There was good mobilization of the descending colon.  We were able to again complete mobilization of the splenic flexure using electrocautery.  Attention was then  turned to the descending colon. We opened the stopped and measure the diameter of the bowel. A 25 mm dilator was perfect size. A pursestring was used after inserting the anvil device.  [name] stay in the abdominal cavity while he went to the perineal area.  Rectal exam did not reveal any obstructing masses.  Dilators were serially placed  through the anus and IEA stapler measuring 25 mm was selected. Under direct visualization we perform an end to end anastomosis with the EEA device. A leak test was performed inflating the colon with a Toomey syringe and a rubber catheter. No evidence of leak was observed. There was also adequate hemostasis and great perfusion. .  Due to extensive surgery I decided to perform an appendectomy.  Appendix was visualized mesoappendix was divided with LigaSure device and 75 GIA stapler was used to divide appendix.  We were also able to take down the falciform ligament using the LigaSure device.  A second look showed no evidence of any bleeding or any other injuries. Left Ureter was identified as well as the bladder and they were protected at all times. Tension then was turned to the abdominal fascia.  We started our dissection on the left side where an incision was created in the posterior sheath and we developed the retrorectus space.  Were able to identify the transversus abdominal muscle and perform a transverse abdominal release starting on the cephalad aspect.  We released the internal Lamella of the internal oblique followed by full transverses abdominal to release from xiphoid to pubis on the left side.  There was a great space in the left side that allow [place] to bring the fascia midline under no tension.  Tension then was turned to the right side where again an incision was created in the posterior sheath of the rectus fascia we developed the retrorectus space with an advancement flap of the muscle.  Please note that we preserved the perforators on both sides. We Made sure that the superior and inferior aspects where on the retrorectus  and on the retropubic space respectively.  Posterior rectus sheath was closed with 2 running 0 PDS sutures.  A 35 x 30 cm mesh was used in a diamond configuration and shape to feel the appropriate retrorectus and  extraperitoneal space. A 19 Blake drain was placed in the  retrorectus space. We fixed the mesh to the xiphoid and also to the pubic bone. Proximal Marcaine was injected throughout the abdominal wall bilaterally under direct visualization. We changed gloves and place a new tray to close the abdomen with a 0 PDS suture in a running fashion for the anterior sheath and the skin was closed with Staples.  Tyrone Luna wound VAC to incorporate the generous midline laparotomy as well as to incorporate the defect for the prior colostomy.  The drain was secured to the abdominal wall using a 3-0 nylon suture. Needle and laparotomy count were correct and there were no immediate occasions  Caroleen Hamman, MD, FACS

## 2019-10-13 NOTE — Transfer of Care (Signed)
Immediate Anesthesia Transfer of Care Note  Patient: Tyrone Luna  Procedure(s) Performed: COLOSTOMY REVERSAL-colostomy takedown-open (N/A Abdomen) APPLICATION OF WOUND VAC REPAIR ABDOMINAL WALL (ABDOMINAL WALL RECONSTRUCTION WITH MESH) APPENDECTOMY  Patient Location: PACU  Anesthesia Type:General  Level of Consciousness: sedated  Airway & Oxygen Therapy: Patient Spontanous Breathing and Patient connected to face mask oxygen  Post-op Assessment: Report given to RN and Post -op Vital signs reviewed and stable  Post vital signs: Reviewed and stable  Last Vitals:  Vitals Value Taken Time  BP 150/75 10/13/19 1521  Temp    Pulse 76 10/13/19 1522  Resp 25 10/13/19 1522  SpO2 100 % 10/13/19 1522  Vitals shown include unvalidated device data.  Last Pain:  Vitals:   10/13/19 1521  TempSrc:   PainSc: 0-No pain         Complications: No apparent anesthesia complications

## 2019-10-13 NOTE — Anesthesia Procedure Notes (Signed)
Procedure Name: Intubation Performed by: Fletcher-Harrison, Lorren Rossetti, CRNA Pre-anesthesia Checklist: Patient identified, Emergency Drugs available, Suction available and Patient being monitored Patient Re-evaluated:Patient Re-evaluated prior to induction Oxygen Delivery Method: Circle system utilized Preoxygenation: Pre-oxygenation with 100% oxygen Induction Type: IV induction Ventilation: Oral airway inserted - appropriate to patient size Laryngoscope Size: McGraph and 3 Grade View: Grade I Tube type: Oral Tube size: 7.0 mm Number of attempts: 1 Airway Equipment and Method: Stylet Placement Confirmation: ETT inserted through vocal cords under direct vision,  positive ETCO2,  CO2 detector and breath sounds checked- equal and bilateral Secured at: 21 cm Tube secured with: Tape Dental Injury: Teeth and Oropharynx as per pre-operative assessment        

## 2019-10-13 NOTE — Progress Notes (Signed)
Received patient from PACU s/p Colostomy reversal with wound vac application, appendectomy, and ventral hernia repair with mesh placement. IV-20 g to [facility] patent. SCDs in place. Dressing to abd C/D/I, wound vac intact. Wife at bedside. Oriented to room. Bed in low position. Nurse call light within reach. Patient resting quietly with no s/s of acute distress noted. Will continue to monitor.

## 2019-10-13 NOTE — Anesthesia Preprocedure Evaluation (Signed)
Anesthesia Evaluation  Patient identified by MRN, date of birth, ID band Patient awake    Reviewed: Allergy & Precautions, NPO status , Patient's Chart, lab work & pertinent test results  History of Anesthesia Complications Negative for: history of anesthetic complications  Airway Mallampati: II  TM Distance: >3 FB Neck ROM: Full    Dental no notable dental hx. (+) Teeth Intact, Dental Advisory Given   Pulmonary neg pulmonary ROS, neg sleep apnea, neg COPD, Patient abstained from smoking.Not current smoker, former smoker,  Former OSA, resolved after palate surgery    Pulmonary exam normal breath sounds clear to auscultation       Cardiovascular Exercise Tolerance: Good METShypertension, Pt. on medications (-) CAD and (-) Past MI negative cardio ROS  (-) dysrhythmias  Rhythm:Regular Rate:Normal - Systolic murmurs    Neuro/Psych negative neurological ROS  negative psych ROS   GI/Hepatic neg GERD  ,(+)     (-) substance abuse  ,   Endo/Other  neg diabetes  Renal/GU negative Renal ROS     Musculoskeletal  (+) Arthritis ,   Abdominal   Peds  Hematology   Anesthesia Other Findings Past Medical History: No date: Arthritis No date: BPH associated with nocturia No date: Cancer ([place])     Comment:  SKIN CA-BASAL AND SQUAMOUS  No date: ED (erectile dysfunction) No date: Erectile dysfunction No date: Family history of adverse reaction to anesthesia     Comment:  SISTER-N/V No date: Hyperlipemia No date: Hypertension 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 No date: Sleep apnea     Comment:  H/O HAD SURGERY-UUU No date: Urinary frequency No date: Urinary urgency  Reproductive/Obstetrics                             Anesthesia Physical Anesthesia Plan  ASA: II  Anesthesia Plan: General   Post-op Pain Management:    Induction: Intravenous  PONV Risk Score and Plan: 3 and Ondansetron and  Dexamethasone  Airway Management Planned: Oral ETT  Additional Equipment: None  Intra-op Plan:   Post-operative Plan: Extubation in OR  Informed Consent: I have reviewed the patients History and Physical, chart, labs and discussed the procedure including the risks, benefits and alternatives for the proposed anesthesia with the patient or authorized representative who has indicated his/her understanding and acceptance.     Dental advisory given  Plan Discussed with: CRNA and Surgeon  Anesthesia Plan Comments: (Discussed risks of anesthesia with patient, including PONV, sore throat, lip/dental damage. Rare risks discussed as well, such as cardiorespiratory sequelae. Patient understands.)        Anesthesia Quick Evaluation

## 2019-10-14 ENCOUNTER — Encounter: Payer: Medicare HMO | Admitting: Surgery

## 2019-10-14 LAB — CBC
HCT: 36.7 % — ABNORMAL LOW (ref 39.0–52.0)
Hemoglobin: 12.5 g/dL — ABNORMAL LOW (ref 13.0–17.0)
MCH: 30.3 pg (ref 26.0–34.0)
MCHC: 34.1 g/dL (ref 30.0–36.0)
MCV: 88.9 fL (ref 80.0–100.0)
Platelets: 180 10*3/uL (ref 150–400)
RBC: 4.13 MIL/uL — ABNORMAL LOW (ref 4.22–5.81)
RDW: 12.7 % (ref 11.5–15.5)
WBC: 12 10*3/uL — ABNORMAL HIGH (ref 4.0–10.5)
nRBC: 0 % (ref 0.0–0.2)

## 2019-10-14 LAB — COMPREHENSIVE METABOLIC PANEL
ALT: 13 U/L (ref 0–44)
AST: 18 U/L (ref 15–41)
Albumin: 3.3 g/dL — ABNORMAL LOW (ref 3.5–5.0)
Alkaline Phosphatase: 29 U/L — ABNORMAL LOW (ref 38–126)
Anion gap: 9 (ref 5–15)
BUN: 21 mg/dL (ref 8–23)
CO2: 23 mmol/L (ref 22–32)
Calcium: 8.8 mg/dL — ABNORMAL LOW (ref 8.9–10.3)
Chloride: 107 mmol/L (ref 98–111)
Creatinine, Ser: 1.07 mg/dL (ref 0.61–1.24)
GFR calc Af Amer: >60 mL/min (ref >60)
GFR calc non Af Amer: >60 mL/min (ref >60)
Glucose, Bld: 126 mg/dL — ABNORMAL HIGH (ref 70–99)
Potassium: 3.7 mmol/L (ref 3.5–5.1)
Sodium: 139 mmol/L (ref 135–145)
Total Bilirubin: 1.1 mg/dL (ref 0.3–1.2)
Total Protein: 5.8 g/dL — ABNORMAL LOW (ref 6.5–8.1)

## 2019-10-14 LAB — MAGNESIUM: Magnesium: 1.9 mg/dL (ref 1.7–2.4)

## 2019-10-14 LAB — PHOSPHORUS: Phosphorus: 3.4 mg/dL (ref 2.5–4.6)

## 2019-10-14 NOTE — Evaluation (Signed)
Physical Therapy Evaluation Patient Details Name: Tyrone Luna MRN: [id number] DOB: 12/15/1945 Today's Date: 10/14/2019   History of Present Illness  Yohannes Thomaston is a 69yoM who comes to [facility] on 2/16 for colostomy takedown. PMH Arthritis, BPH c nocturia, ED (erectile dysfunction), HLD, HTN, Male hypogonadism, OSA, Urinary frequency, and Urinary urgency, hx of Rt knee sx, Lt inguinal hernia repair, and B eye sx., Hartman's procedure for perforated diverticulit Summer 2020.  Clinical Impression  Pt admitted with above diagnosis. Pt currently with functional limitations due to the deficits listed below (see "PT Problem List"). Upon entry, pt in bed, awake and agreeable to participate. The pt is alert and oriented x4, pleasant, conversational, and generally a good historian. Pain pain free at rest, but quite pain limited with mobility. Educated on log roll for pain control during bed mobility, no physical assist needed. Rises to standing with RW and slight elevated surface, supervision level, able to AMB nearly 365ft with RW. Functional mobility assessment demonstrates increased effort/time requirements, poor tolerance, and need for physical assistance, whereas the patient performed these at a higher level of independence PTA. Pt likely to do well with return to home after admission. Should try a few stairs prior to DC to simulate home entry. Pt will benefit from skilled PT intervention to increase independence and safety with basic mobility in preparation for discharge to the venue listed below.       Follow Up Recommendations Home health PT;Supervision for mobility/OOB;Supervision - Intermittent    Equipment Recommendations  None recommended by PT    Recommendations for Other Services       Precautions / Restrictions Precautions Precautions: Fall Restrictions Weight Bearing Restrictions: No      Mobility  Bed Mobility Overal bed mobility: Needs Assistance Bed Mobility: Supine  to Sit     Supine to sit: Min guard     General bed mobility comments: educated on log roll technique for pain management. no physical assist needed; heavy effort and pain with sidelying to sitting.  Transfers Overall transfer level: Modified independent Equipment used: Rolling walker (2 wheeled) Transfers: Sit to/from Stand Sit to Stand: Modified independent (Device/Increase time);From elevated surface            Ambulation/Gait Ambulation/Gait assistance: Min guard;Supervision Gait Distance (Feet): 270 Feet Assistive device: Rolling walker (2 wheeled) Gait Pattern/deviations: Antalgic Gait velocity: 0.39m/s      Stairs            Wheelchair Mobility    Modified Rankin (Stroke Patients Only)       Balance Overall balance assessment: Modified Independent;Mild deficits observed, not formally tested                                           Pertinent Vitals/Pain Pain Assessment: Faces Faces Pain Scale: Hurts even more Pain Location: surgical sight; no pain during rest Pain Intervention(s): Limited activity within patient's tolerance;Monitored during session    Home Living Family/patient expects to be discharged to:: Private residence Living Arrangements: Spouse/significant other Available Help at Discharge: Family Type of Home: House Home Access: Stairs to enter Entrance Stairs-Rails: Right Entrance Stairs-Number of Steps: 3 Home Layout: Able to live on main level with bedroom/bathroom Home Equipment: Cane - single point;Walker - 2 wheels;Bedside commode;Shower seat Additional Comments: Has been sleeping in recliner since summer (stomach sleeper with new ostomy)    Prior Function Level of  Independence: Independent(Has been AMB in house withou device up to 5 miuntes at a time, not really going out >2x weekly.)         Comments: Pt. was independent with ADLs, and IADLs, driving, and is retired.     Hand Dominance   Dominant  Hand: Right    Extremity/Trunk Assessment   Upper Extremity Assessment Upper Extremity Assessment: Overall WFL for tasks assessed    Lower Extremity Assessment Lower Extremity Assessment: Overall WFL for tasks assessed    Cervical / Trunk Assessment Cervical / Trunk Assessment: Normal  Communication   Communication: No difficulties  Cognition Arousal/Alertness: Awake/alert Behavior During Therapy: WFL for tasks assessed/performed Overall Cognitive Status: Within Functional Limits for tasks assessed                                        General Comments      Exercises     Assessment/Plan    PT Assessment Patient needs continued PT services  PT Problem List Decreased activity tolerance;Decreased mobility       PT Treatment Interventions DME instruction;Balance training;Gait training;Functional mobility training;Therapeutic activities;Therapeutic exercise    PT Goals (Current goals can be found in the Care Plan section)  Acute Rehab PT Goals Patient Stated Goal: return to home, reduce pain PT Goal Formulation: With patient Time For Goal Achievement: 10/28/19 Potential to Achieve Goals: Good    Frequency Min 2X/week   Barriers to discharge        Co-evaluation               AM-PAC PT "6 Clicks" Mobility  Outcome Measure Help needed turning from your back to your side while in a flat bed without using bedrails?: A Little Help needed moving from lying on your back to sitting on the side of a flat bed without using bedrails?: A Little Help needed moving to and from a bed to a chair (including a wheelchair)?: A Little Help needed standing up from a chair using your arms (e.g., wheelchair or bedside chair)?: A Little Help needed to walk in hospital room?: None Help needed climbing 3-5 steps with a railing? : A Little 6 Click Score: 19    End of Session   Activity Tolerance: Patient tolerated treatment well;Patient limited by pain Patient  left: in chair;with call bell/phone within reach Nurse Communication: Mobility status PT Visit Diagnosis: Difficulty in walking, not elsewhere classified (R26.2);Other abnormalities of gait and mobility (R26.89)    Time: [id number] PT Time Calculation (min) (ACUTE ONLY): 33 min   Charges:   PT Evaluation $PT Eval Moderate Complexity: 1 Mod PT Treatments $Therapeutic Exercise: 8-22 mins        5:17 PM, 10/14/19 Etta Grandchild, PT, DPT Physical Therapist - [facility]  [phone] ([place])    Bruna Dills C 10/14/2019, 5:15 PM

## 2019-10-14 NOTE — Progress Notes (Signed)
[place] Hospital Day(s): 1.   Post op day(s): 1 Day Post-Op.   Interval History:  Patient seen and examined no acute events or new complaints overnight.  Patient reports he is doing well, abdominal pain, worse with movement Leukocytosis to 12K; afebrile Renal function in normal limits; U/O - 940 Surgical drain with 200 ccs out, serosanguinous On CLD; tolerating; no flatus  Vital signs in last 24 hours: [min-max] current  Temp:  [97.5 F (36.4 C)-98.5 F (36.9 C)] 97.8 F (36.6 C) (02/17 0418) Pulse Rate:  [64-78] 64 (02/17 0418) Resp:  [12-16] 16 (02/17 0418) BP: (120-150)/(63-75) 120/63 (02/17 0418) SpO2:  [92 %-100 %] 97 % (02/17 0418) Weight:  [101.1 kg] 101.1 kg (02/16 1652)     Height: 6\' 1"  (185.4 cm) Weight: 101.1 kg BMI (Calculated): 29.4   Intake/Output last 2 shifts:  02/16 0701 - 02/17 0700 In: 4200 [I.V.:3300; IV Piggyback:850] Out: 1400 [Urine:940; Drains:260; Blood:200]   Physical Exam:  Constitutional: alert, cooperative and no distress  Respiratory: breathing non-labored at rest  Cardiovascular: regular rate and sinus rhythm  Gastrointestinal: soft, incisional soreness, and non-distended, no rebound/guarding, JP in left abdomen with bloody drainage.  Integumentary: Prevena vac to midline wound  Labs:  CBC Latest Ref Rng & Units 10/13/2019 03/30/2019 03/28/2019  WBC 4.0 - 10.5 K/uL 15.0(H) 13.3(H) 8.9  Hemoglobin 13.0 - 17.0 g/dL 13.3 12.2(L) 12.7(L)  Hematocrit 39.0 - 52.0 % 38.4(L) 37.3(L) 38.4(L)  Platelets 150 - 400 K/uL 181 533(H) 620(H)   CMP Latest Ref Rng & Units 10/13/2019 10/12/2019 03/28/2019  Glucose 70 - 99 mg/dL - - 153(H)  BUN 8 - 23 mg/dL - - 16  Creatinine 0.61 - 1.24 mg/dL 1.25(H) - 0.89  Sodium 135 - 145 mmol/L - - 134(L)  Potassium 3.5 - 5.1 mmol/L - 3.7 3.7  Chloride 98 - 111 mmol/L - - 100  CO2 22 - 32 mmol/L - - 24  Calcium 8.9 - 10.3 mg/dL - - 8.2(L)  Total Protein 6.5 - 8.1 g/dL - - -   Total Bilirubin 0.3 - 1.2 mg/dL - - -  Alkaline Phos 38 - 126 U/L - - -  AST 15 - 41 U/L - - -  ALT 0 - 44 U/L - - -     Imaging studies: No new pertinent imaging studies   Assessment/Plan:  74 y.o. male with overall doing well, awaiting return of bowel function 1 Day Post-Op s/p colostomy takedown and abdominal wall reconstruction.   - Continue CLD for today; await signs of bowel function prior to advancing  - Continue IVF  - Continue foley catheter today; monitor U/O  - Pain control prn (minimize narcotics when feasible); antiemetics prn  - monitor abdominal examination  - Trend labs  - Mobilize early; engage PT   - medical management of comorbid conditions   All of the above findings and recommendations were discussed with the patient, and the medical team, and all of patient's questions were answered to his expressed satisfaction.  -- Edison Simon, PA-C [facility] 10/14/2019, 7:37 AM [phone] M-F: 7am - 4pm

## 2019-10-14 NOTE — Anesthesia Postprocedure Evaluation (Signed)
Anesthesia Post Note  Patient: Tyrone Luna  Procedure(s) Performed: COLOSTOMY REVERSAL-colostomy takedown-open (N/A Abdomen) APPLICATION OF WOUND VAC REPAIR ABDOMINAL WALL (ABDOMINAL WALL RECONSTRUCTION WITH MESH) APPENDECTOMY  Patient location during evaluation: PACU Anesthesia Type: General Level of consciousness: awake and alert and oriented Pain management: pain level controlled Vital Signs Assessment: post-procedure vital signs reviewed and stable Respiratory status: spontaneous breathing, nonlabored ventilation and respiratory function stable Cardiovascular status: blood pressure returned to baseline and stable Postop Assessment: no signs of nausea or vomiting Anesthetic complications: no     Last Vitals:  Vitals:   10/14/19 0212 10/14/19 0418  BP: 125/63 120/63  Pulse: 65 64  Resp: 16 16  Temp: (!) 36.4 C 36.6 C  SpO2: 96% 97%    Last Pain:  Vitals:   10/14/19 0727  TempSrc:   PainSc: 0-No pain                 Marlita Keil

## 2019-10-15 LAB — SURGICAL PATHOLOGY

## 2019-10-15 NOTE — TOC Initial Note (Signed)
Transition of Care [facility]) - Initial/Assessment Note    Patient Details  Name: Tyrone Luna MRN: [id number] Date of Birth: November 24, 1945  Transition of [facility]) CM/SW Contact:    Shelbie Ammons, RN Phone Number: 10/15/2019, 9:42 AM  Clinical Narrative:  RNCM assessed patient at bedside and spoke with wife by phone. Patient lives in 2 story home with several steps to enter. He has all necessary DME in home due to having just gone through same type of surgery the end of last summer. He has Dr. Doy Hutching for PCP and uses [facility] on [address]. He is normally independent and able to attend to his own personal care needs.  Discussed with patient/wife that they have recommended he have PT at home once leaving hospital. Patient is agreeable to services through [facility] again. RNCM contacted Jason with [facility] and he will let CM know if they can take patient. RNCM will continue to follow.       Expected Discharge Plan: [place] Barriers to Discharge: Continued Medical Work up   Patient Goals and CMS Choice Patient states their goals for this hospitalization and ongoing recovery are:: to get back home and get healed up      Expected Discharge Plan and Services Expected Discharge Plan: [place]   Discharge Planning Services: CM Consult Post Acute Care Choice: [place] arrangements for the past 2 months: [place]: PT [place]: [place] ([place]) Date HH Agency Contacted: 10/15/19 Time HH Agency Contacted: 0940 Representative spoke with at [place]: [place] Arrangements/Services Living arrangements for the past 2 months: [place] Lives with:: Spouse Patient language and need for interpreter reviewed:: Yes Do you feel safe going back to the place where you live?: Yes      Need for Family Participation in Patient Care: Yes (Comment) Care giver  support system in place?: Yes (comment)   Criminal Activity/Legal Involvement Pertinent to Current Situation/Hospitalization: No - Comment as needed  Activities of Daily Living Home Assistive Devices/Equipment: Eyeglasses ADL Screening (condition at time of admission) Patient's cognitive ability adequate to safely complete daily activities?: Yes Is the patient deaf or have difficulty hearing?: Yes Does the patient have difficulty seeing, even when wearing glasses/contacts?: No Does the patient have difficulty concentrating, remembering, or making decisions?: No Patient able to express need for assistance with ADLs?: Yes Does the patient have difficulty dressing or bathing?: No Independently performs ADLs?: Yes (appropriate for developmental age) Does the patient have difficulty walking or climbing stairs?: Yes Weakness of Legs: None Weakness of Arms/Hands: None  Permission Sought/Granted Permission sought to share information with : Family Supports    Share Information with NAME: Johnandrew Hucker           Emotional Assessment Appearance:: Appears stated age Attitude/Demeanor/Rapport: Engaged Affect (typically observed): Appropriate Orientation: : Oriented to Self, Oriented to Place, Oriented to  Time, Oriented to Situation   Psych Involvement: No (comment)  Admission diagnosis:  S/P colostomy takedown [id number] Patient Active Problem List   Diagnosis Date Noted  . S/P colostomy takedown 10/13/2019  . Wound infection after surgery   . Wound infection 03/28/2019  . Diverticulitis large intestine 03/12/2019  . Morbid obesity due to excess calories ([place]) 05/18/2015  . BPH with obstruction/lower urinary  tract symptoms 05/05/2015  . Nocturia 05/05/2015  . Erectile dysfunction of organic origin 05/05/2015  . ED (erectile dysfunction) of organic origin 03/22/2015  . Allergy to environmental factors 03/22/2015  . Hyperlipidemia, unspecified 03/22/2015  . BP (high blood pressure)  03/22/2015  . 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 Apnea, sleep 03/22/2015   PCP:  [name], MD Pharmacy:   [facility] [address], [place] - [place] [address] [place] [address] Phone: [phone] Fax: [phone]  [place], [place] - [place] [address] Phone: [phone] Fax: [phone]     Social Determinants of Health (SDOH) Interventions    Readmission Risk Interventions No flowsheet data found.

## 2019-10-15 NOTE — Care Management Important Message (Signed)
Important Message  Patient Details  Name: Tyrone Luna MRN: [id number] Date of Birth: 1946/06/14   Medicare Important Message Given:  Yes     [name] 10/15/2019, 12:17 PM

## 2019-10-15 NOTE — Progress Notes (Signed)
[place] Hospital Day(s): 2.   Post op day(s): 2 Days Post-Op.   Interval History:  Patient seen and examined no acute events or new complaints overnight.  Patient reports he has incisional soreness, worse with movement, pain medications provide relief Leukocytosis improved to 12K this morning; afebrile Renal function normalized, sCr 1.07, U/O - 3.5L  On CLD; tolerating; still awaiting bowel function Mobilized with PT'; recommend HHPT  Vital signs in last 24 hours: [min-max] current  Temp:  [97.4 F (36.3 C)-98.3 F (36.8 C)] 97.6 F (36.4 C) (02/18 0605) Pulse Rate:  [66-72] 67 (02/18 0605) Resp:  [14-16] 16 (02/18 0605) BP: (129-155)/(69-80) 143/74 (02/18 0605) SpO2:  [94 %-98 %] 94 % (02/18 0605)     Height: 6\' 1"  (185.4 cm) Weight: 101.1 kg BMI (Calculated): 29.4   Intake/Output last 2 shifts:  02/17 0701 - 02/18 0700 In: 2754.2 [P.O.:360; I.V.:2394.2] Out: 3795 [Urine:3575; Drains:220]   Physical Exam:  Constitutional: alert, cooperative and no distress  Respiratory: breathing non-labored at rest  Cardiovascular: regular rate and sinus rhythm  Gastrointestinal: soft, incisional soreness, and non-distended, no rebound/guarding, JP in left abdomen with serosanguinous drainage Genitourinary: Foley in Place; making good urine Integumentary: Prevena vac to midline wound   Labs:  CBC Latest Ref Rng & Units 10/14/2019 10/13/2019 03/30/2019  WBC 4.0 - 10.5 K/uL 12.0(H) 15.0(H) 13.3(H)  Hemoglobin 13.0 - 17.0 g/dL 12.5(L) 13.3 12.2(L)  Hematocrit 39.0 - 52.0 % 36.7(L) 38.4(L) 37.3(L)  Platelets 150 - 400 K/uL 180 181 533(H)   CMP Latest Ref Rng & Units 10/14/2019 10/13/2019 10/12/2019  Glucose 70 - 99 mg/dL 126(H) - -  BUN 8 - 23 mg/dL 21 - -  Creatinine 0.61 - 1.24 mg/dL 1.07 1.25(H) -  Sodium 135 - 145 mmol/L 139 - -  Potassium 3.5 - 5.1 mmol/L 3.7 - 3.7  Chloride 98 - 111 mmol/L 107 - -  CO2 22 - 32 mmol/L 23 - -  Calcium 8.9  - 10.3 mg/dL 8.8(L) - -  Total Protein 6.5 - 8.1 g/dL 5.8(L) - -  Total Bilirubin 0.3 - 1.2 mg/dL 1.1 - -  Alkaline Phos 38 - 126 U/L 29(L) - -  AST 15 - 41 U/L 18 - -  ALT 0 - 44 U/L 13 - -     Imaging studies: No new pertinent imaging studies   Assessment/Plan:  74 y.o. male doing well awaiting return of bowel fucntion 2 Days Post-Op s/p colostomy takedown and abdominal wall reconstruction.   - Continue CLD today; wean IVF   - Discontinue foley catheter  - Pain control prn (minimize narcotics when feasible); antiemetics prn             - monitor abdominal examination             - Monitor leukocytosis; likely reactive from surgery; improving             - Mobilize early; PT following; recommending HHPT             - medical management of comorbid conditions   All of the above findings and recommendations were discussed with the patient, and the medical team, and all of patient's questions were answered to his expressed satisfaction.  -- Edison Simon, PA-C [facility] 10/15/2019, 7:44 AM [phone] M-F: 7am - 4pm

## 2019-10-16 LAB — BASIC METABOLIC PANEL
Anion gap: 8 (ref 5–15)
BUN: 10 mg/dL (ref 8–23)
CO2: 25 mmol/L (ref 22–32)
Calcium: 8.7 mg/dL — ABNORMAL LOW (ref 8.9–10.3)
Chloride: 105 mmol/L (ref 98–111)
Creatinine, Ser: 0.78 mg/dL (ref 0.61–1.24)
GFR calc Af Amer: >60 mL/min (ref >60)
GFR calc non Af Amer: >60 mL/min (ref >60)
Glucose, Bld: 115 mg/dL — ABNORMAL HIGH (ref 70–99)
Potassium: 3.1 mmol/L — ABNORMAL LOW (ref 3.5–5.1)
Sodium: 138 mmol/L (ref 135–145)

## 2019-10-16 LAB — CBC
HCT: 41.1 % (ref 39.0–52.0)
Hemoglobin: 14.2 g/dL (ref 13.0–17.0)
MCH: 30.4 pg (ref 26.0–34.0)
MCHC: 34.5 g/dL (ref 30.0–36.0)
MCV: 88 fL (ref 80.0–100.0)
Platelets: 187 10*3/uL (ref 150–400)
RBC: 4.67 MIL/uL (ref 4.22–5.81)
RDW: 13 % (ref 11.5–15.5)
WBC: 8.9 10*3/uL (ref 4.0–10.5)
nRBC: 0 % (ref 0.0–0.2)

## 2019-10-16 LAB — MAGNESIUM: Magnesium: 1.9 mg/dL (ref 1.7–2.4)

## 2019-10-16 MED ORDER — POTASSIUM CHLORIDE CRYS ER 20 MEQ PO TBCR
40.0000 meq | EXTENDED_RELEASE_TABLET | Freq: Two times a day (BID) | ORAL | Status: AC
  Administered 2019-10-16: 15:00:00 40 meq via ORAL
  Filled 2019-10-16: qty 2

## 2019-10-16 MED ORDER — AMLODIPINE BESYLATE 10 MG PO TABS
10.0000 mg | ORAL_TABLET | Freq: Every day | ORAL | Status: DC
  Administered 2019-10-16 – 2019-10-19 (×4): 10 mg via ORAL
  Filled 2019-10-16 (×4): qty 1

## 2019-10-16 NOTE — Progress Notes (Signed)
[place] Hospital Day(s): 3.   Post op day(s): 3 Days Post-Op.   Interval History:  Patient seen and examined no acute events or new complaints overnight.  Patient reports he feels better this morning, does have incisional soreness worse with movement, improved with pain medications No fever, chills, nausea, or emesis He has tolerated full liquid diet and endorses flatus JP drain with 130 cc Mobilizing with PT No new complaints.    Vital signs in last 24 hours: [min-max] current  Temp:  [97.7 F (36.5 C)-98.4 F (36.9 C)] 98.4 F (36.9 C) (02/19 1035) Pulse Rate:  [55-87] 87 (02/19 1035) Resp:  [16-20] 18 (02/19 1035) BP: (134-201)/(70-82) 188/81 (02/19 1035) SpO2:  [94 %-96 %] 94 % (02/19 1035)     Height: 6\' 1"  (185.4 cm) Weight: 101.1 kg BMI (Calculated): 29.4   Intake/Output last 2 shifts:  02/18 0701 - 02/19 0700 In: 3224 [I.V.:3224] Out: 2840 [Urine:2650; Drains:190]   Physical Exam:  Constitutional: alert, cooperative and no distress  Respiratory: breathing non-labored at rest  Cardiovascular: regular rate and sinus rhythm  Gastrointestinal: soft,incisional soreness, and non-distended, no rebound/guarding, JP in left abdomen with serosanguinous drainage Integumentary:Prevena vac to midline wound   Labs:  CBC Latest Ref Rng & Units 10/14/2019 10/13/2019 03/30/2019  WBC 4.0 - 10.5 K/uL 12.0(H) 15.0(H) 13.3(H)  Hemoglobin 13.0 - 17.0 g/dL 12.5(L) 13.3 12.2(L)  Hematocrit 39.0 - 52.0 % 36.7(L) 38.4(L) 37.3(L)  Platelets 150 - 400 K/uL 180 181 533(H)   CMP Latest Ref Rng & Units 10/14/2019 10/13/2019 10/12/2019  Glucose 70 - 99 mg/dL 126(H) - -  BUN 8 - 23 mg/dL 21 - -  Creatinine 0.61 - 1.24 mg/dL 1.07 1.25(H) -  Sodium 135 - 145 mmol/L 139 - -  Potassium 3.5 - 5.1 mmol/L 3.7 - 3.7  Chloride 98 - 111 mmol/L 107 - -  CO2 22 - 32 mmol/L 23 - -  Calcium 8.9 - 10.3 mg/dL 8.8(L) - -  Total Protein 6.5 - 8.1 g/dL 5.8(L) - -   Total Bilirubin 0.3 - 1.2 mg/dL 1.1 - -  Alkaline Phos 38 - 126 U/L 29(L) - -  AST 15 - 41 U/L 18 - -  ALT 0 - 44 U/L 13 - -     Imaging studies: No new pertinent imaging studies   Assessment/Plan:  74 y.o. male overall improved with return of bowel fucntion 3 Days Post-Op s/p colostomy takedown and abdominal wall reconstruction.   - Advance to regular diet  - Discontinue IVF   - Pain control prn (minimize narcotics when feasible); antiemetics prn - monitor abdominal examination  - Morning labs - Monitor leukocytosis; likely reactive from surgery; improving - Mobilize early; PT following; recommending HHPT - medical management of comorbid conditions   - Discharge Planning: If tolerates advancement of diet without issue and pain reasonably controlled hopefully home over the weekend  All of the above findings and recommendations were discussed with the patient, and the medical team, and all of patient's questions were answered to his expressed satisfaction.  -- Edison Simon, PA-C [facility] 10/16/2019, 12:05 PM [phone] M-F: 7am - 4pm

## 2019-10-16 NOTE — Progress Notes (Signed)
Physical Therapy Treatment Patient Details Name: Tyrone Luna MRN: [id number] DOB: [birth year] Today's Date: 10/16/2019    History of Present Illness Tyrone Luna is a 38yoM who comes to [facility] on 2/16 for colostomy takedown. PMH Arthritis, BPH c nocturia, ED (erectile dysfunction), HLD, HTN, Male hypogonadism, OSA, Urinary frequency, and Urinary urgency, hx of Rt knee sx, Lt inguinal hernia repair, and B eye sx., Hartman's procedure for perforated diverticulit Summer 2020.    PT Comments    Author arrives to session to ask if pt requires analgesia prior to mobility which he denies. RN reports pt has had no pain meds since procedure. Pt in bed, log roll reviewed, performed without assist to sitting, reports some dizziness. Dizziness progressively worsens over 1-2 minutes, then verbally indicates need to return to supine. Author assists legs back into bed minA, pt nauseated and remains dizzy. O2 WNL. HR in 50s bpm. SBP in 200s, assessed twice, then 180s  5 minutes later when RN reassesses. Pt vomiting 3 times, mostly clear mucous, low volume, some bubbles, no color, no food stuffs. Session terminated. Secure chat sent to PA/MD/RN.      Follow Up Recommendations  Home health PT;Supervision for mobility/OOB;Supervision - Intermittent     Equipment Recommendations  None recommended by PT    Recommendations for Other Services       Precautions / Restrictions Precautions Precautions: Fall Restrictions Weight Bearing Restrictions: No    Mobility  Bed Mobility Overal bed mobility: Needs Assistance Bed Mobility: Supine to Sit;Sit to Supine     Supine to sit: Supervision Sit to supine: Min assist   General bed mobility comments: review of log roll, not performed with high acuracy, but pt less limited by  pain; has dizzines upon sitting which progressively worsens, pt eventually needs to return to supine; HR 50s; SBP: 200s.  Transfers                     Ambulation/Gait                 Stairs             Wheelchair Mobility    Modified Rankin (Stroke Patients Only)       Balance Overall balance assessment: Modified Independent;Mild deficits observed, not formally tested                                          Cognition Arousal/Alertness: Awake/alert Behavior During Therapy: WFL for tasks assessed/performed Overall Cognitive Status: Within Functional Limits for tasks assessed                                        Exercises      General Comments        Pertinent Vitals/Pain Pain Assessment: Faces Faces Pain Scale: Hurts even more Pain Intervention(s): Limited activity within patient's tolerance;Monitored during session;Repositioned    Home Living                      Prior Function            PT Goals (current goals can now be found in the care plan section) Acute Rehab PT Goals Patient Stated Goal: return to home, reduce pain PT Goal Formulation: With patient Time For Goal  Achievement: 10/28/19 Potential to Achieve Goals: Good Progress towards PT goals: PT to reassess next treatment    Frequency    Min 2X/week      PT Plan Current plan remains appropriate    Co-evaluation              AM-PAC PT "6 Clicks" Mobility   Outcome Measure  Help needed turning from your back to your side while in a flat bed without using bedrails?: A Little Help needed moving from lying on your back to sitting on the side of a flat bed without using bedrails?: A Little Help needed moving to and from a bed to a chair (including a wheelchair)?: A Little Help needed standing up from a chair using your arms (e.g., wheelchair or bedside chair)?: A Little Help needed to walk in hospital room?: None Help needed climbing 3-5 steps with a railing? : A Little 6 Click Score: 19    End of Session   Activity Tolerance: Treatment limited secondary to medical  complications (Comment) Patient left: in bed;with nursing/sitter in room Nurse Communication: Mobility status PT Visit Diagnosis: Difficulty in walking, not elsewhere classified (R26.2);Other abnormalities of gait and mobility (R26.89)     Time: [id number] PT Time Calculation (min) (ACUTE ONLY): 20 min  Charges:  $Therapeutic Activity: 8-22 mins                     12:23 PM, 10/16/19 Etta Grandchild, PT, DPT Physical Therapist - [facility]  [phone] ([place])    Madison C 10/16/2019, 12:20 PM

## 2019-10-17 LAB — BASIC METABOLIC PANEL
Anion gap: 6 (ref 5–15)
BUN: 17 mg/dL (ref 8–23)
CO2: 26 mmol/L (ref 22–32)
Calcium: 8.9 mg/dL (ref 8.9–10.3)
Chloride: 108 mmol/L (ref 98–111)
Creatinine, Ser: 0.93 mg/dL (ref 0.61–1.24)
GFR calc Af Amer: >60 mL/min (ref >60)
GFR calc non Af Amer: >60 mL/min (ref >60)
Glucose, Bld: 106 mg/dL — ABNORMAL HIGH (ref 70–99)
Potassium: 3.7 mmol/L (ref 3.5–5.1)
Sodium: 140 mmol/L (ref 135–145)

## 2019-10-17 LAB — CBC
HCT: 38.4 % — ABNORMAL LOW (ref 39.0–52.0)
Hemoglobin: 13 g/dL (ref 13.0–17.0)
MCH: 30.3 pg (ref 26.0–34.0)
MCHC: 33.9 g/dL (ref 30.0–36.0)
MCV: 89.5 fL (ref 80.0–100.0)
Platelets: 202 10*3/uL (ref 150–400)
RBC: 4.29 MIL/uL (ref 4.22–5.81)
RDW: 13.2 % (ref 11.5–15.5)
WBC: 8 10*3/uL (ref 4.0–10.5)
nRBC: 0 % (ref 0.0–0.2)

## 2019-10-17 LAB — MAGNESIUM: Magnesium: 2 mg/dL (ref 1.7–2.4)

## 2019-10-17 NOTE — Progress Notes (Signed)
[place] Hospital Day(s): 4.   Post op day(s): 4 Days Post-Op.   Interval History:  Patient seen and examined no acute events or new complaints overnight.  Patient reports he feels better this morning, does have incisional soreness worse with movement, improved with pain medications No fever, chills, nausea, or emesis Tolerating diet but self-limiting due to vasovagal episode after bacon and eggs yesterday. JP drain output not recorded Mobilizing with PT, however due to a vasovagal episode yesterday, he did not walk. No new complaints.    Vital signs in last 24 hours: [min-max] current  Temp:  [97.5 F (36.4 C)-98.6 F (37 C)] 98.4 F (36.9 C) (02/20 0700) Pulse Rate:  [60-81] 65 (02/20 1142) Resp:  [15-16] 15 (02/20 1142) BP: (129-147)/(72-82) 130/73 (02/20 1142) SpO2:  [95 %-96 %] 95 % (02/20 1142)     Height: 6\' 1"  (185.4 cm) Weight: 101.1 kg BMI (Calculated): 29.4   Intake/Output last 2 shifts:  02/19 0701 - 02/20 0700 In: 240 [P.O.:240] Out: 1170 [Urine:1100; Drains:70]   Physical Exam:  Constitutional: alert, cooperative and no distress  Respiratory: breathing non-labored at rest  Cardiovascular: regular rate and sinus rhythm  Gastrointestinal: soft,incisional soreness, and non-distended, no rebound/guarding, JP in left abdomen with serosanguinous drainage Integumentary:Prevena vac to midline wound   Labs:  CBC Latest Ref Rng & Units 10/17/2019 10/16/2019 10/14/2019  WBC 4.0 - 10.5 K/uL 8.0 8.9 12.0(H)  Hemoglobin 13.0 - 17.0 g/dL 13.0 14.2 12.5(L)  Hematocrit 39.0 - 52.0 % 38.4(L) 41.1 36.7(L)  Platelets 150 - 400 K/uL 202 187 180   CMP Latest Ref Rng & Units 10/17/2019 10/16/2019 10/14/2019  Glucose 70 - 99 mg/dL 106(H) 115(H) 126(H)  BUN 8 - 23 mg/dL 17 10 21   Creatinine 0.61 - 1.24 mg/dL 0.93 0.78 1.07  Sodium 135 - 145 mmol/L 140 138 139  Potassium 3.5 - 5.1 mmol/L 3.7 3.1(L) 3.7  Chloride 98 - 111 mmol/L 108  105 107  CO2 22 - 32 mmol/L 26 25 23   Calcium 8.9 - 10.3 mg/dL 8.9 8.7(L) 8.8(L)  Total Protein 6.5 - 8.1 g/dL - - 5.8(L)  Total Bilirubin 0.3 - 1.2 mg/dL - - 1.1  Alkaline Phos 38 - 126 U/L - - 29(L)  AST 15 - 41 U/L - - 18  ALT 0 - 44 U/L - - 13     Imaging studies: No new pertinent imaging studies   Assessment/Plan:  74 y.o. male overall improved with return of bowel fucntion 4 Days Post-Op s/p colostomy takedown and abdominal wall reconstruction.   -Continue regular diet  -We will request that nursing assist with ambulation, in addition to physical therapy.  - Pain control prn (minimize narcotics when feasible); antiemetics prn - monitor abdominal examination  - Morning labs - Monitor leukocytosis; normal on today's labs - Mobilize early; PT following; recommending HHPT - medical management of comorbid conditions   - Discharge Planning: Could potentially go home tomorrow, however secondary to extensive surgery, he may need to stay through the weekend, depending on his progress.  All of the above findings and recommendations were discussed with the patient, and the medical team, and all of patient's questions were answered to his expressed satisfaction.

## 2019-10-18 NOTE — Progress Notes (Signed)
Patient currently laying in bed and states no pain but does say whenever he gets up to the bathroom his pain level is around a 4 on pain scale 1-10. Emptied JP drain and emptied 14ml of general red blood with a small clot in left in drain. Unsure if small clot was already there for Nurse assigned to patient at the end of the night. Patient tolerated well. Wound vac intact and no drainage noted and is working properly. Will continue to monitor patient to end of shift.

## 2019-10-18 NOTE — Progress Notes (Signed)
PT Cancellation Note  Patient Details Name: [name] MRN: [id number] DOB: Jul 11, 1946   Cancelled Treatment:    Reason Eval/Treat Not Completed: Other (comment)   Pt just returning to room with nurse tech upon arrival.  He is able to walk x 1 lap around unit.  Reports no difficulties and overall feeling better.  Declines further activity at this time.  Will continue as appropriate.   Chesley Noon 10/18/2019, 12:07 PM

## 2019-10-18 NOTE — Progress Notes (Signed)
[place] Hospital Day(s): 5.   Post op day(s): 5 Days Post-Op.   Interval History:  Patient seen and examined no acute events or new complaints overnight.  Patient reports he feels better this morning, does have incisional soreness worse with movement, improved with pain medications No fever, chills, nausea, or emesis Tolerating diet but self-limiting due to vasovagal episode after bacon and eggs two days ago. JP drain output 80cc Walked twice yesterday and is currently sitting up in a chair. No new complaints.    Vital signs in last 24 hours: [min-max] current  Temp:  [98 F (36.7 C)-98.7 F (37.1 C)] 98 F (36.7 C) (02/21 0441) Pulse Rate:  [63-76] 76 (02/21 1025) Resp:  [15-16] 15 (02/21 0441) BP: (135-144)/(73-88) 135/88 (02/21 1025) SpO2:  [96 %] 96 % (02/21 0441)     Height: 6\' 1"  (185.4 cm) Weight: 101.1 kg BMI (Calculated): 29.4   Intake/Output last 2 shifts:  02/20 0701 - 02/21 0700 In: 91 [P.O.:480] Out: 780 [Urine:700; Drains:80]   Physical Exam:  Constitutional: alert, cooperative and no distress  Respiratory: breathing non-labored at rest  Cardiovascular: regular rate and sinus rhythm  Gastrointestinal: soft,incisional soreness, and non-distended, no rebound/guarding, JP in left abdomen with serosanguinous drainage Integumentary:Prevena vac to midline wound   Labs:  CBC Latest Ref Rng & Units 10/17/2019 10/16/2019 10/14/2019  WBC 4.0 - 10.5 K/uL 8.0 8.9 12.0(H)  Hemoglobin 13.0 - 17.0 g/dL 13.0 14.2 12.5(L)  Hematocrit 39.0 - 52.0 % 38.4(L) 41.1 36.7(L)  Platelets 150 - 400 K/uL 202 187 180   CMP Latest Ref Rng & Units 10/17/2019 10/16/2019 10/14/2019  Glucose 70 - 99 mg/dL 106(H) 115(H) 126(H)  BUN 8 - 23 mg/dL 17 10 21   Creatinine 0.61 - 1.24 mg/dL 0.93 0.78 1.07  Sodium 135 - 145 mmol/L 140 138 139  Potassium 3.5 - 5.1 mmol/L 3.7 3.1(L) 3.7  Chloride 98 - 111 mmol/L 108 105 107  CO2 22 - 32 mmol/L 26 25  23   Calcium 8.9 - 10.3 mg/dL 8.9 8.7(L) 8.8(L)  Total Protein 6.5 - 8.1 g/dL - - 5.8(L)  Total Bilirubin 0.3 - 1.2 mg/dL - - 1.1  Alkaline Phos 38 - 126 U/L - - 29(L)  AST 15 - 41 U/L - - 18  ALT 0 - 44 U/L - - 13     Imaging studies: No new pertinent imaging studies   Assessment/Plan:  74 y.o. male overall improved with return of bowel fucntion 5 Days Post-Op s/p colostomy takedown and abdominal wall reconstruction.   -Continue regular diet  -We will request that nursing assist with ambulation, in addition to physical therapy.  - Pain control prn (minimize narcotics when feasible); antiemetics prn - monitor abdominal examination  - Morning labs - Leukocytosis resolved. - Mobilize early; PT following; recommending HHPT - medical management of comorbid conditions   - Discharge Planning: Due to extensive surgery, patient would like to stay another day, which I think is appropriate.  All of the above findings and recommendations were discussed with the patient, and the medical team, and all of patient's questions were answered to his expressed satisfaction.

## 2019-10-18 NOTE — Plan of Care (Signed)

## 2019-10-18 NOTE — Plan of Care (Signed)
Patient doing well.  Wound vac to the abdomen are C/D/I with no drainage.  Patient has had a BM today. He has ambulated in the hallway and tolerated it well.  Pain is being controlled with Toradol and Tylenol.  No significant changes.

## 2019-10-19 LAB — BASIC METABOLIC PANEL
Anion gap: 6 (ref 5–15)
BUN: 21 mg/dL (ref 8–23)
CO2: 27 mmol/L (ref 22–32)
Calcium: 8.6 mg/dL — ABNORMAL LOW (ref 8.9–10.3)
Chloride: 105 mmol/L (ref 98–111)
Creatinine, Ser: 0.85 mg/dL (ref 0.61–1.24)
GFR calc Af Amer: >60 mL/min (ref >60)
GFR calc non Af Amer: >60 mL/min (ref >60)
Glucose, Bld: 98 mg/dL (ref 70–99)
Potassium: 3.3 mmol/L — ABNORMAL LOW (ref 3.5–5.1)
Sodium: 138 mmol/L (ref 135–145)

## 2019-10-19 LAB — CBC
HCT: 35.7 % — ABNORMAL LOW (ref 39.0–52.0)
Hemoglobin: 12.1 g/dL — ABNORMAL LOW (ref 13.0–17.0)
MCH: 30.1 pg (ref 26.0–34.0)
MCHC: 33.9 g/dL (ref 30.0–36.0)
MCV: 88.8 fL (ref 80.0–100.0)
Platelets: 213 10*3/uL (ref 150–400)
RBC: 4.02 MIL/uL — ABNORMAL LOW (ref 4.22–5.81)
RDW: 13.2 % (ref 11.5–15.5)
WBC: 8.3 10*3/uL (ref 4.0–10.5)
nRBC: 0 % (ref 0.0–0.2)

## 2019-10-19 LAB — MAGNESIUM: Magnesium: 2 mg/dL (ref 1.7–2.4)

## 2019-10-19 LAB — PHOSPHORUS: Phosphorus: 3.3 mg/dL (ref 2.5–4.6)

## 2019-10-19 MED ORDER — POTASSIUM CHLORIDE CRYS ER 20 MEQ PO TBCR
20.0000 meq | EXTENDED_RELEASE_TABLET | Freq: Two times a day (BID) | ORAL | Status: DC
  Administered 2019-10-19: 10:00:00 20 meq via ORAL
  Filled 2019-10-19: qty 1

## 2019-10-19 MED ORDER — GABAPENTIN 300 MG PO CAPS: 300.0000 mg | ORAL_CAPSULE | Freq: Three times a day (TID) | ORAL | 0 refills | Status: DC

## 2019-10-19 MED ORDER — METHOCARBAMOL 500 MG PO TABS: 500.0000 mg | ORAL_TABLET | Freq: Four times a day (QID) | ORAL | 0 refills | Status: DC | PRN

## 2019-10-19 MED ORDER — OXYCODONE HCL 5 MG PO TABS: 5.0000 mg | ORAL_TABLET | Freq: Four times a day (QID) | ORAL | 0 refills | Status: DC | PRN

## 2019-10-19 MED ORDER — IBUPROFEN 600 MG PO TABS: 600.0000 mg | ORAL_TABLET | Freq: Four times a day (QID) | ORAL | 0 refills | Status: DC | PRN

## 2019-10-19 NOTE — TOC Transition Note (Signed)
Transition of Care [facility]) - CM/SW Discharge Note   Patient Details  Name: ACHYUTH WILBOURNE MRN: [id number] Date of Birth: 29-Jan-1946  [facility]) CM/SW Contact:  Beverly Sessions, RN Phone Number: 10/19/2019, 11:12 AM   Clinical Narrative:    Patient to discharge home today  [facility] with [place] notified of discharge    Final next level of care: [place] Barriers to Discharge: No Barriers Identified   Patient Goals and CMS Choice Patient states their goals for this hospitalization and ongoing recovery are:: to get back home and get healed up      Discharge Placement                       Discharge Plan and Services   Discharge Planning Services: CM Consult Post Acute Care Choice: [place]: PT [place]: [place] ([place]) Date [facility] Agency Contacted: 10/19/19 Time HH Agency Contacted: [phone] Representative spoke with at [place]: [place] ([place]) Interventions     Readmission Risk Interventions No flowsheet data found.

## 2019-10-19 NOTE — Discharge Summary (Signed)
[facility] SURGICAL DISCHARGE SUMMARY  Patient ID: Tyrone Luna MRN: [id number] DOB/AGE: [birth year] 74 y.o.  Admit date: 10/13/2019 Discharge date: 10/19/2019  Discharge Diagnoses Patient Active Problem List   Diagnosis Date Noted  . S/P colostomy takedown 10/13/2019    Consultants None  Procedures 10/13/2019:  1.Extensive lysis of adhesions 2. Colostomy takedown EEA stapled anastomosis 43mm 3. Excisional debridement abdominal wall measuring 30x3 cm= 90cm2 including fascia 4. Appendectomy 5. Abdominal wall reconstruction  ( ventral hernia repair ) with bilateral Myocutaneous flaps, TAR release On the left and Reeves-Stoppa on the Right. Using 35x30 cms Phasix Mesh 6. Placement of Prevena wound VAC 90cm2 7. Mobilization of splenic flexure   HPI: Tyrone Luna is a 74 y.o. male with a history of perforated diverticulitis who underwent Hartman's in the summer of 2020 who presents to [facility] on 02/16 for scheduled colostomy takedown and abdominal wall reconstruction with Dr Dahlia Byes.    Hospital Course: Informed consent was obtained and documented, and patient underwent uneventful colostomy take down and abdominal wall reconstruction (Dr Dahlia Byes, 10/13/2019).  Post-operatively, patient did well and advancement of patient's diet and ambulation were well-tolerated. The remainder of patient's hospital course was essentially unremarkable, and discharge planning was initiated accordingly with patient safely able to be discharged home with appropriate discharge instructions, pain control, and outpatient follow-up after all of his questions were answered to his expressed satisfaction.   Discharge Condition: Good   Physical Examination:  Constitutional: alert, cooperative and no distress  Respiratory: breathing non-labored at rest  Cardiovascular: regular rate and sinus rhythm  Gastrointestinal: soft,incisional soreness, and non-distended, no rebound/guarding, JP in  left abdomen withserosanguinous drainage Integumentary:Prevena vac to midline wound    Allergies as of 10/19/2019   No Known Allergies     Medication List    STOP taking these medications   bisacodyl 5 MG EC tablet Commonly known as: DULCOLAX   erythromycin base 500 MG tablet Commonly known as: E-MYCIN   neomycin 500 MG tablet Commonly known as: MYCIFRADIN   polyethylene glycol powder 17 GM/SCOOP powder Commonly known as: MiraLax     TAKE these medications   amLODipine 10 MG tablet Commonly known as: NORVASC Take 10 mg by mouth every morning.   aspirin 81 MG tablet Take 81 mg by mouth daily.   dutasteride 0.5 MG capsule Commonly known as: AVODART Take 1 capsule (0.5 mg total) by mouth daily. What changed: when to take this   fluticasone 50 MCG/ACT nasal spray Commonly known as: FLONASE Place 2 sprays into both nostrils daily.   gabapentin 300 MG capsule Commonly known as: NEURONTIN Take 1 capsule (300 mg total) by mouth 3 (three) times daily.   GLUCOSAMINE 1500 COMPLEX PO Take 1 capsule by mouth daily.   ibuprofen 600 MG tablet Commonly known as: ADVIL Take 1 tablet (600 mg total) by mouth every 6 (six) hours as needed for mild pain or moderate pain.   methocarbamol 500 MG tablet Commonly known as: ROBAXIN Take 1 tablet (500 mg total) by mouth every 6 (six) hours as needed for muscle spasms.   omeprazole 40 MG capsule Commonly known as: PRILOSEC Take 40 mg by mouth every morning.   oxyCODONE 5 MG immediate release tablet Commonly known as: Oxy IR/ROXICODONE Take 1 tablet (5 mg total) by mouth every 6 (six) hours as needed for severe pain or breakthrough pain.   pilocarpine 5 MG tablet Commonly known as: SALAGEN Take 5 mg by mouth 2 (two) times daily.  PRESERVISION AREDS 2 PO Take 1 capsule by mouth daily.   sildenafil 20 MG tablet Commonly known as: REVATIO Take 3 to 5 tablets two hours before intercouse on an empty stomach.  Do not take  with nitrates.   simvastatin 20 MG tablet Commonly known as: ZOCOR Take 20 mg by mouth daily at 6 PM.   triamterene-hydrochlorothiazide 37.5-25 MG capsule Commonly known as: DYAZIDE Take 1 capsule by mouth daily.        Follow-up Information    Pabon, [place] F, MD. Schedule an appointment as soon as possible for a visit in 1 week(s).   Specialty: General Surgery Why: s/p colostomy takedown and abdominal wall reconstruction, has drain  Contact information: [address] [place] [address] [phone]            Time spent on discharge management including discussion of hospital course, clinical condition, outpatient instructions, prescriptions, and follow up with the patient and members of the medical team: >30 minutes  -- Edison Simon , PA-C  [facility]  10/19/2019, 10:05 AM [phone] M-F: 7am - 4pm

## 2019-10-19 NOTE — Care Management Important Message (Signed)
Important Message  Patient Details  Name: Tyrone Luna MRN: [id number] Date of Birth: 1946/03/21   Medicare Important Message Given:  Yes     [name] 10/19/2019, 1:18 PM

## 2019-10-23 ENCOUNTER — Other Ambulatory Visit: Payer: Self-pay

## 2019-10-23 ENCOUNTER — Encounter: Payer: Self-pay | Admitting: Physician Assistant

## 2019-10-23 ENCOUNTER — Ambulatory Visit (INDEPENDENT_AMBULATORY_CARE_PROVIDER_SITE_OTHER): Payer: Self-pay | Admitting: Physician Assistant

## 2019-10-23 VITALS — BP 147/83 | HR 66 | Temp 97.2°F | Resp 14 | Ht 73.0 in | Wt 222.4 lb

## 2019-10-23 DIAGNOSIS — K5720 Diverticulitis of large intestine with perforation and abscess without bleeding: Secondary | ICD-10-CM

## 2019-10-23 DIAGNOSIS — Z09 Encounter for follow-up examination after completed treatment for conditions other than malignant neoplasm: Secondary | ICD-10-CM

## 2019-10-23 NOTE — Patient Instructions (Addendum)
We removed your drain today. Please keep a dressing over the drain site until it heals completely. Please see your follow up appointment listed below.   No lifting heavy objects at this time.   You may proceed with  the Covid vaccine.

## 2019-10-23 NOTE — Progress Notes (Signed)
[facility] POST-OP OFFICE VISIT  10/23/2019  HPI: Tyrone Luna is a 74 y.o. male 10 days s/p colostomy takedown and abdominal wall reconstruction with Dr Dahlia Byes.   He reports that he is doing well. He has some soreness in his lower abdomen but this is managed well with Ibuprofen. No fever, chills, nausea, or emesis. He is tolerating PO without issue. He is having diarrhea but this is much improved. JP drain has been serous and <30 ccs a day over the last few days.    Vital signs: BP (!) 147/83   Pulse 66   Temp (!) 97.2 F (36.2 C) (Temporal)   Resp 14   Ht 6\' 1"  (1.854 m)   Wt 222 lb 6.4 oz (100.9 kg)   SpO2 97%   BMI 29.34 kg/m    Physical Exam: Constitutional: Well appearing male, NAD Abdomen: Soft, non-tender, non-distended, JP drain in the RLQ with minimal serous output (removed in office) Skin: Laparotomy incision and previous colostomy site are healing well with staples in place, there is ecchymosis in various stages of healing around incisions, no erythema or drainage.   Assessment/Plan: This is a 74 y.o. male 10 days s/p colostomy takedown and abdominal wall reconstruction   - JP drain removed  - I did leave the staples today to ensure good wound healing, he has a follow up on 03/03 and we can remove staples then  - reviewed wound care instruction  - reviewed lifting restrictions  - he can consider imodium TID PRN for diarrhea if needed; appears diarrhea is improving  - follow up on 03/03 with Pabon  -- Edison Simon, PA-C [facility] 10/23/2019, 9:25 AM [phone] M-F: 7am - 4pm

## 2019-10-28 ENCOUNTER — Encounter: Payer: Self-pay | Admitting: Surgery

## 2019-10-28 ENCOUNTER — Ambulatory Visit (INDEPENDENT_AMBULATORY_CARE_PROVIDER_SITE_OTHER): Payer: Self-pay | Admitting: Surgery

## 2019-10-28 ENCOUNTER — Other Ambulatory Visit: Payer: Self-pay

## 2019-10-28 VITALS — BP 116/71 | HR 59 | Temp 97.9°F | Resp 14 | Ht 73.0 in | Wt 215.0 lb

## 2019-10-28 DIAGNOSIS — Z09 Encounter for follow-up examination after completed treatment for conditions other than malignant neoplasm: Secondary | ICD-10-CM

## 2019-10-28 NOTE — Patient Instructions (Signed)
We have removed your staples and placed steri strips today. These will start to come off in 1-2 weeks. You may shower but do not scrub the areas.   You may get the Covid vaccine.   Follow up here in 2 weeks.

## 2019-10-30 ENCOUNTER — Encounter: Payer: Self-pay | Admitting: Surgery

## 2019-10-30 NOTE — Progress Notes (Signed)
Tyrone Luna is a 74 year old male history of colostomy takedown and abdominal wall reconstruction on October 13, 2019 by me.  He is doing well.  No fevers no chills taking p.o. and having normal bowel movements.  PE NAD Abd: Soft, nontender incision healing well without evidence of infection.  Rest of the staples were removed.  There is no peritonitis.  A/p doing very well without complications we will do another follow-up in a few weeks for wound check.  There is no evidence of recurrences.

## 2019-11-11 ENCOUNTER — Other Ambulatory Visit: Payer: Self-pay

## 2019-11-11 ENCOUNTER — Encounter: Payer: Self-pay | Admitting: Surgery

## 2019-11-11 ENCOUNTER — Ambulatory Visit (INDEPENDENT_AMBULATORY_CARE_PROVIDER_SITE_OTHER): Payer: Self-pay | Admitting: Surgery

## 2019-11-11 VITALS — BP 131/83 | HR 66 | Temp 97.2°F | Resp 12 | Ht 73.0 in | Wt 217.6 lb

## 2019-11-11 DIAGNOSIS — Z09 Encounter for follow-up examination after completed treatment for conditions other than malignant neoplasm: Secondary | ICD-10-CM

## 2019-11-11 NOTE — Patient Instructions (Signed)
Dr.Pabon recommends patient he may continue to take the over the counter Imodium to help with diarrhea and loose stools.   If you have any questions or concerns, please do not hesitate to give our office a call.

## 2019-11-11 NOTE — Progress Notes (Signed)
S/p ostomy takedown and abdominal wall reconstruction. He is doing very well. Some occasional watery bowel movement. Two of them a day Fevers no chills. Tolerating regular diet.  PE : No acute distress. Awake and alert. Walks without assistance. Abd: Soft nontender incision healing well without evidence of infection or hernias. No peritonitis  A/P doing extremely well considering the magnitude of his operation. Now no heavy lifting. I will see him back in about 2 months or so to follow-up on his abdominal wall.

## 2020-01-20 ENCOUNTER — Ambulatory Visit (INDEPENDENT_AMBULATORY_CARE_PROVIDER_SITE_OTHER): Payer: Medicare HMO | Admitting: Surgery

## 2020-01-20 ENCOUNTER — Other Ambulatory Visit: Payer: Self-pay

## 2020-01-20 ENCOUNTER — Encounter: Payer: Self-pay | Admitting: Surgery

## 2020-01-20 VITALS — BP 146/85 | HR 61 | Temp 97.7°F | Resp 12 | Ht 74.0 in | Wt 217.2 lb

## 2020-01-20 DIAGNOSIS — K5720 Diverticulitis of large intestine with perforation and abscess without bleeding: Secondary | ICD-10-CM | POA: Diagnosis not present

## 2020-01-20 NOTE — Patient Instructions (Addendum)
You may start taking Imodium 2-3 times a day, you can get this over the counter.  Continue to take the fiber supplement.   Follow up as needed. Call the office if you have any questions or concerns.

## 2020-01-22 NOTE — Progress Notes (Signed)
Outpatient Surgical Follow Up  01/22/2020  Tyrone Luna is an 74 y.o. male.   Chief Complaint  Patient presents with  . Follow-up    colostomy take down sx 10/13/19 Dr Dahlia Byes    HPI: Tyrone Luna is a 74 year old male well-known to me status post colostomy takedown with abdominal wall reconstruction. Doing very well. No fevers no chills taking p.o. No evidence of hernia recurrence. No abdominal pain. He is been walking and is very happy  Past Medical History:  Diagnosis Date  . Arthritis   . BPH associated with nocturia   . Cancer ([place])    SKIN CA-BASAL AND SQUAMOUS   . ED (erectile dysfunction)   . Erectile dysfunction   . Family history of adverse reaction to anesthesia    SISTER-N/V  . Hyperlipemia   . Hypertension   . Male hypogonadism   . Sleep apnea    H/O HAD SURGERY-UUU  . Urinary frequency   . Urinary urgency     Past Surgical History:  Procedure Laterality Date  . ABDOMINAL WALL DEFECT REPAIR  10/13/2019   Procedure: REPAIR ABDOMINAL WALL (ABDOMINAL WALL RECONSTRUCTION WITH MESH);  Surgeon: Jules Husbands, MD;  Location: [facility] ORS;  Service: General;;  . APPENDECTOMY  10/13/2019   Procedure: APPENDECTOMY;  Surgeon: Jules Husbands, MD;  Location: [facility] ORS;  Service: General;;  . APPLICATION OF WOUND VAC  10/13/2019   Procedure: APPLICATION OF WOUND VAC;  Surgeon: Jules Husbands, MD;  Location: [facility] OR[facility];  Service: General;;  SERIAL NUMBERBT:[id number]  . COLONOSCOPY WITH PROPOFOL N/A 07/17/2019   Procedure: COLONOSCOPY WITH PROPOFOL;  Surgeon: [name], MD;  Location[facility] ENDOSCOPY;  Service: Gastroenterology;  Laterality: N/A;  . COLOSTOMY REVERSAL N/A 10/13/2019   Procedure: COLOSTOMY REVERSAL-colostomy takedown-open;  Surgeon: Jules Husbands, MD;  Location: [facility] ORS;  Service: General;  Laterality: N/A;  . EYE SURGERY Bilateral 2002   laser  . INGUINAL HERNIA REPAIR Left   . KNEE SURGERY Right 1995  . LAPAROTOMY N/A 03/15/2019   Procedure: EXPLORATORY  LAPAROTOMY,sigmoid colectomy,colostomy;  Surgeon: Jules Husbands, MD;  Location: [facility] ORS;  Service: General;  Laterality: N/A;  . UVULOPALATOPHARYNGOPLASTY (UPPP)/TONSILLECTOMY/SEPTOPLASTY      Family History  Problem Relation Age of Onset  . Hypertension Other   . Prostate cancer Neg Hx   . Bladder Cancer Neg Hx   . Kidney cancer Neg Hx     Social History:  reports that he has quit smoking. His smoking use included cigarettes. He has a 1.50 pack-year smoking history. He has never used smokeless tobacco. He reports that he does not drink alcohol or use drugs.  Allergies: No Known Allergies  Medications reviewed.    ROS Full ROS performed and is otherwise negative other than what is stated in HPI   BP (!) 146/85   Pulse 61   Temp 97.7 F (36.5 C)   Resp 12   Ht 6\' 2"  (1.88 m)   Wt 217 lb 3.2 oz (98.5 kg)   SpO2 95%   BMI 27.89 kg/m   Physical Exam Vitals and nursing note reviewed. Exam conducted with a chaperone present.  Constitutional:      Appearance: He is normal weight.  Pulmonary:     Effort: Pulmonary effort is normal. No respiratory distress.     Breath sounds: Normal breath sounds.  Abdominal:     General: Abdomen is flat. There is no distension.     Palpations: There is no mass.  Tenderness: There is no abdominal tenderness. There is no guarding or rebound.     Hernia: No hernia is present.     Comments: Well-healed midline laparotomy scar  Skin:    General: Skin is warm and dry.     Capillary Refill: Capillary refill takes less than 2 seconds.  Neurological:     General: No focal deficit present.     Mental Status: He is alert and oriented to person, place, and time.  Psychiatric:        Mood and Affect: Mood normal.        Behavior: Behavior normal.        Thought Content: Thought content normal.        Judgment: Judgment normal.       Assessment/Plan: Doing very well after abdominal wall reconstruction. There is no evidence of hernia  recurrence or complications May return to full activities. RTC as needed   Greater than 50% of the 15 minutes  visit was spent in counseling/coordination of care   Caroleen Hamman, MD [place]geon

## 2020-06-20 ENCOUNTER — Ambulatory Visit: Payer: Medicare HMO | Attending: Internal Medicine

## 2020-06-20 DIAGNOSIS — Z23 Encounter for immunization: Secondary | ICD-10-CM

## 2020-06-20 NOTE — Progress Notes (Signed)
   Covid-19 Vaccination Clinic  Name:  Tyrone Luna    MRN: [id number] DOB: 10-27-1945  06/20/2020  Tyrone Luna was observed post Covid-19 immunization for 15 minutes without incident. He was provided with Vaccine Information Sheet and instruction to access the V-Safe system.   Tyrone Luna was instructed to call 911 with any severe reactions post vaccine: Marland Kitchen Difficulty breathing  . Swelling of face and throat  . A fast heartbeat  . A bad rash all over body  . Dizziness and weakness

## 2020-08-09 ENCOUNTER — Ambulatory Visit: Payer: Medicare HMO

## 2020-11-15 DIAGNOSIS — C441122 Basal cell carcinoma of skin of right lower eyelid, including canthus: Secondary | ICD-10-CM | POA: Insufficient documentation

## 2020-11-15 DIAGNOSIS — H02831 Dermatochalasis of right upper eyelid: Secondary | ICD-10-CM | POA: Insufficient documentation

## 2020-12-06 ENCOUNTER — Other Ambulatory Visit (HOSPITAL_COMMUNITY): Payer: Self-pay | Admitting: Internal Medicine

## 2020-12-06 ENCOUNTER — Other Ambulatory Visit: Payer: Self-pay | Admitting: Internal Medicine

## 2020-12-06 DIAGNOSIS — R1084 Generalized abdominal pain: Secondary | ICD-10-CM

## 2020-12-06 DIAGNOSIS — R42 Dizziness and giddiness: Secondary | ICD-10-CM

## 2020-12-06 DIAGNOSIS — R55 Syncope and collapse: Secondary | ICD-10-CM

## 2020-12-29 ENCOUNTER — Ambulatory Visit
Admission: RE | Admit: 2020-12-29 | Discharge: 2020-12-29 | Disposition: A | Discharge status: Home / Self Care | Payer: Medicare HMO | Source: Ambulatory Visit | Attending: Internal Medicine | Admitting: Internal Medicine

## 2020-12-29 ENCOUNTER — Other Ambulatory Visit: Payer: Self-pay

## 2020-12-29 DIAGNOSIS — R1084 Generalized abdominal pain: Secondary | ICD-10-CM

## 2020-12-29 DIAGNOSIS — R42 Dizziness and giddiness: Secondary | ICD-10-CM | POA: Insufficient documentation

## 2020-12-29 DIAGNOSIS — R55 Syncope and collapse: Secondary | ICD-10-CM | POA: Insufficient documentation

## 2020-12-29 IMAGING — US US ABDOMEN COMPLETE
1 series · 14 of 25 positions shown · non-contrast
Comparison: CT abdomen and pelvis [DATE]

CLINICAL DATA: One year of intermittent generalized abdominal pain.

EXAM:
ABDOMEN ULTRASOUND COMPLETE

[Series 1: us abdomen complete · 0.24mm/px · 14 of 109 slices shown]
[im 1/109]
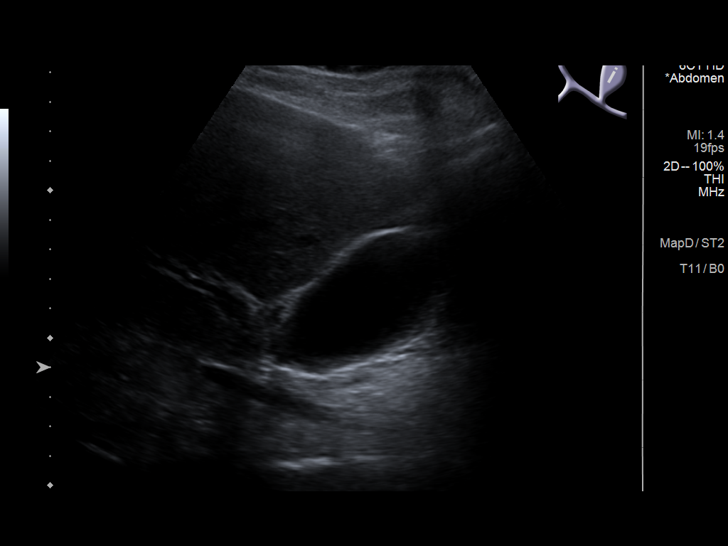
[im 10/109]
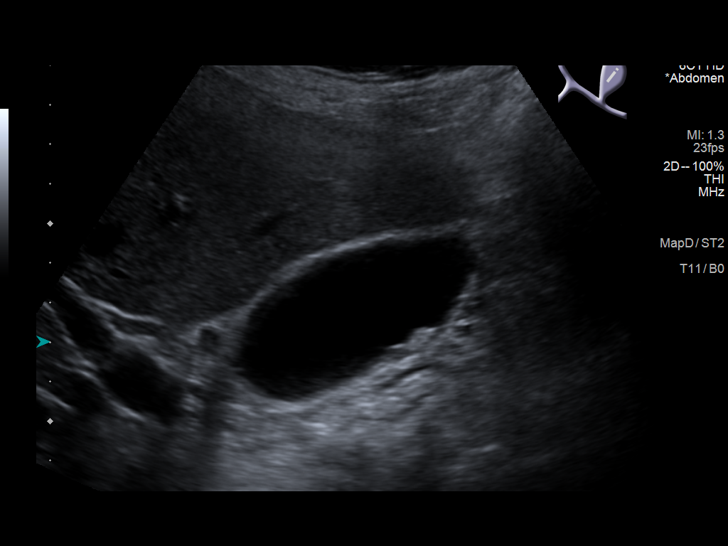
[im 19/109]
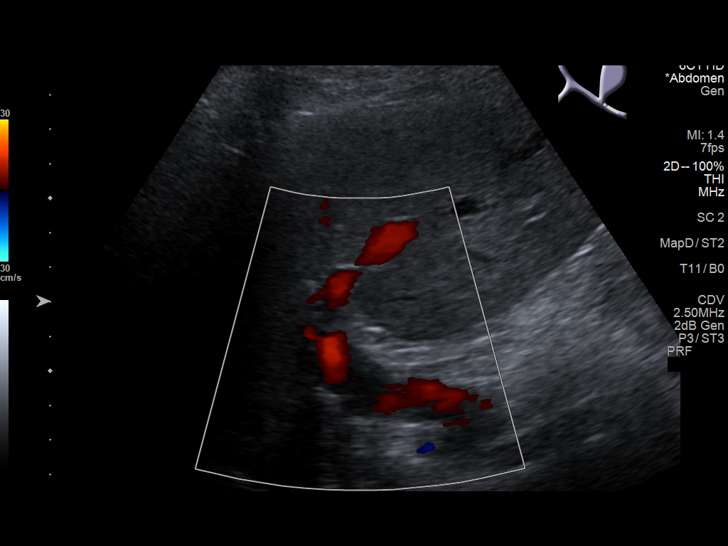
[im 28/109]
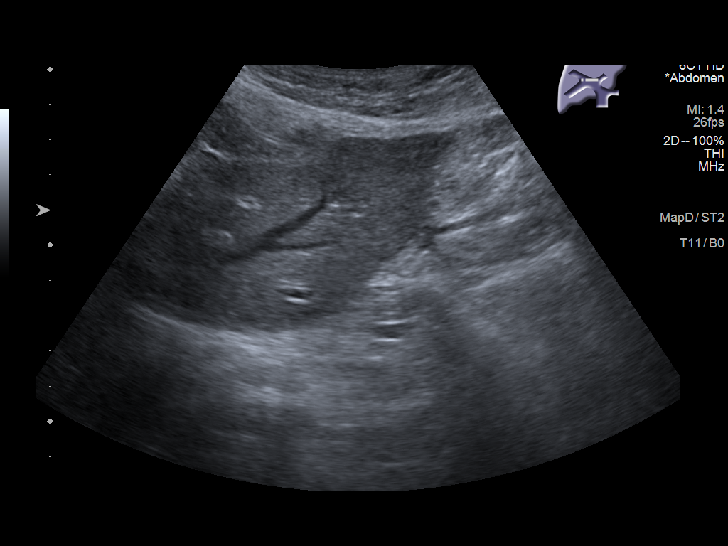
[im 37/109]
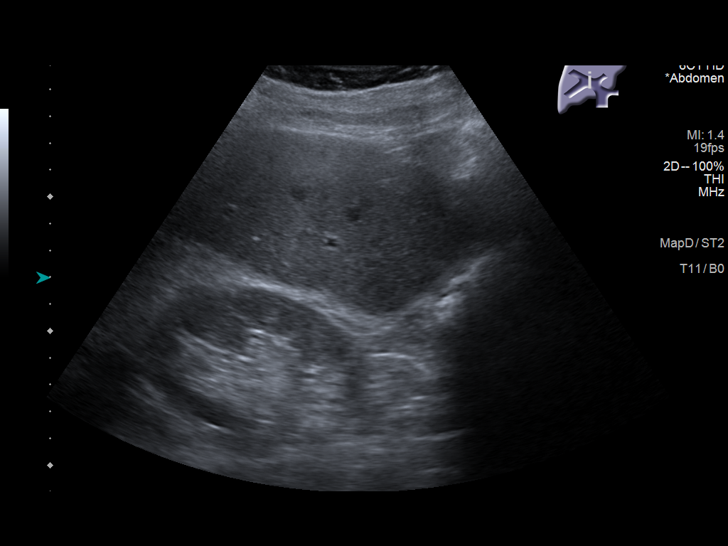
[im 41/109]
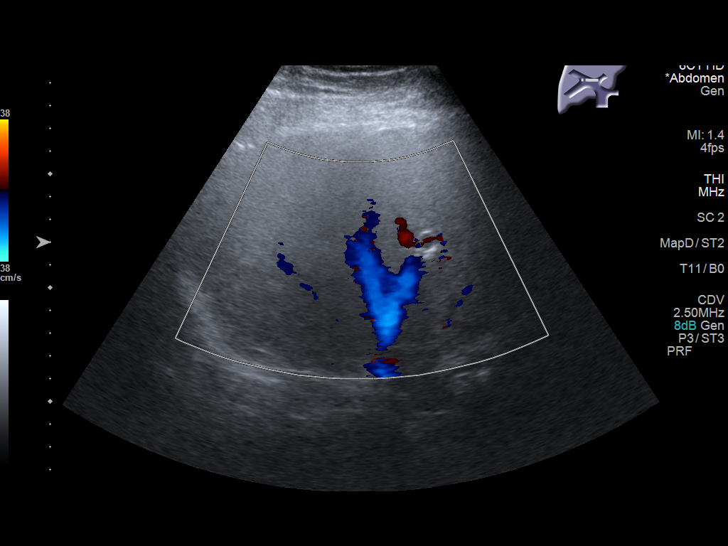
[im 50/109]
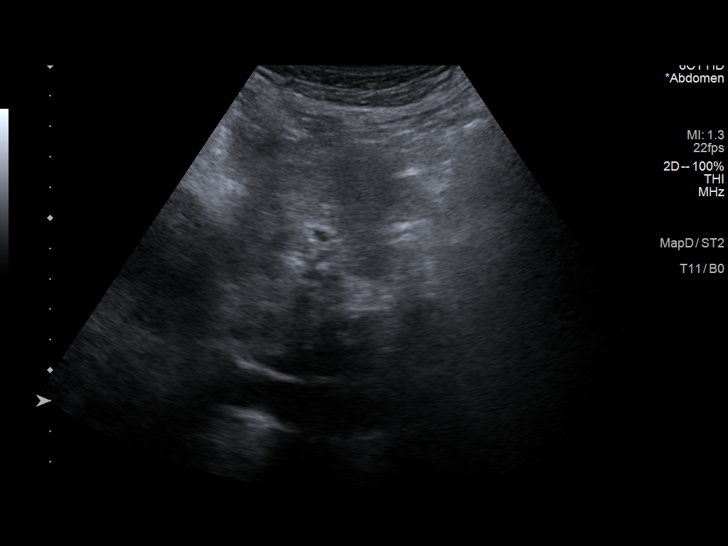
[im 59/109]
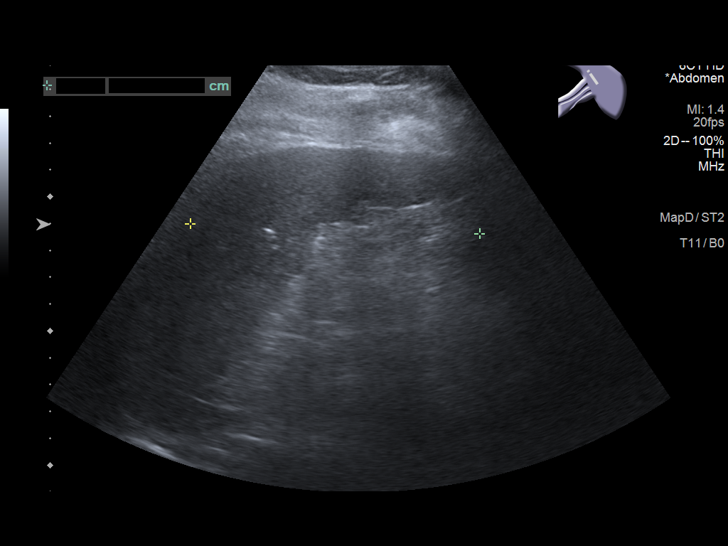
[im 68/109]
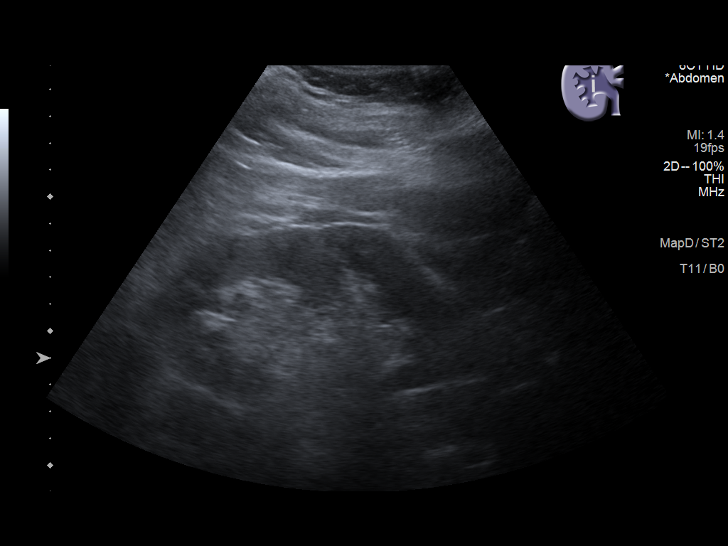
[im 73/109]
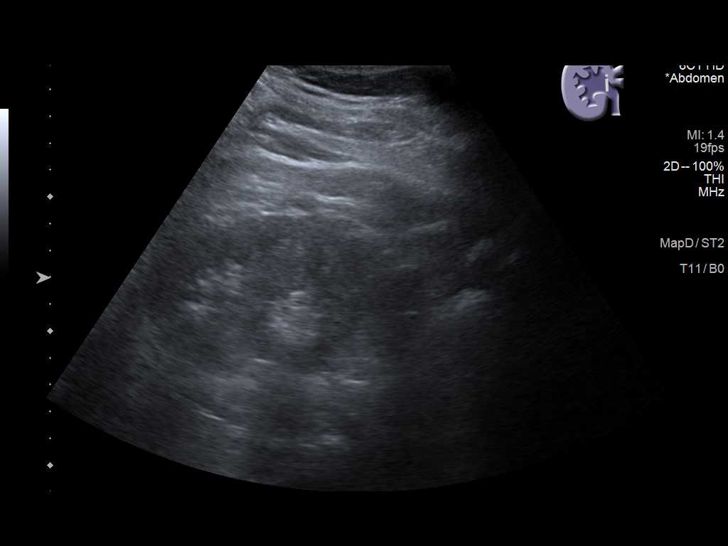
[im 82/109]
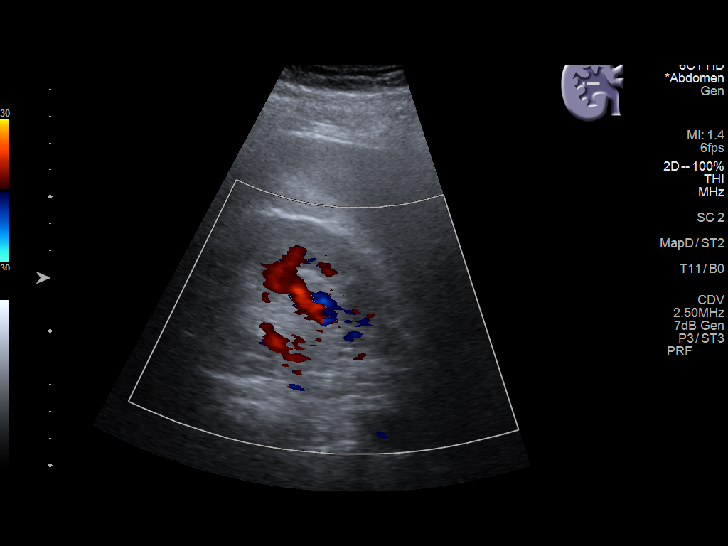
[im 91/109]
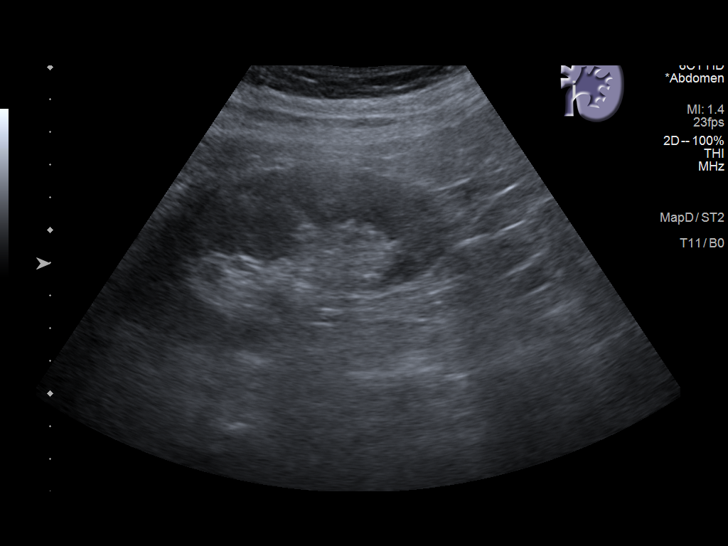
[im 100/109]
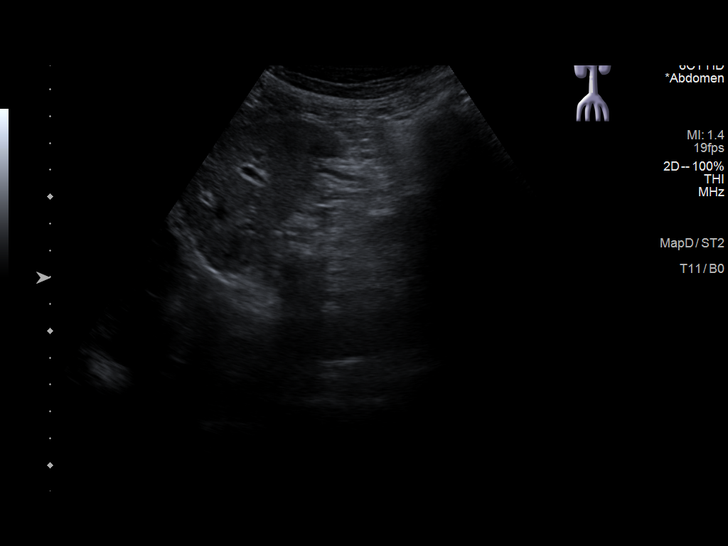
[im 109/109]
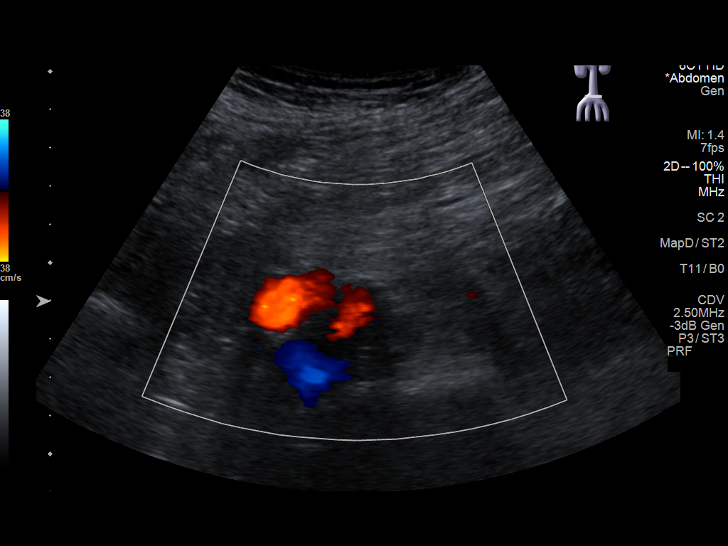

[14 of 25 positions shown; findings below may reference images not displayed]

FINDINGS: Gallbladder: Multiple mobile gallstones measuring up to 5 mm. No
pericholecystic fluid or wall thickening visualized. No sonographic
Murphy sign noted by sonographer.

Common bile duct: Diameter: 3 mm

Liver: No focal lesion identified. Within normal limits in
parenchymal echogenicity. Portal vein is patent on color Doppler
imaging with normal direction of blood flow towards the liver.

IVC: No abnormality visualized.

Pancreas: Visualized portion unremarkable.

Spleen: Size and appearance within normal limits.

Right Kidney: Length: 12.5 cm. Echogenicity within normal limits. No
mass or hydronephrosis visualized.

Left Kidney: Length: 11.5 cm. Echogenicity within normal limits. No
mass or hydronephrosis visualized.

Abdominal aorta: No aneurysm visualized.

Other findings: None.
IMPRESSION: Multiple cholelithiasis measuring up to 5 mm without findings of
acute cholecystitis.

## 2020-12-29 IMAGING — CT CT HEAD W/O CM
3 of 4 series · 13 of 47 positions shown, 15 images · non-contrast
Comparison: MRI brain [DATE]

CLINICAL DATA: Intermittent episodes of blurred vision over the
last month

EXAM:
CT HEAD WITHOUT CONTRAST
TECHNIQUE: Contiguous axial images were obtained from the base of the skull
through the vertex without intravenous contrast.

[Series 2: axial st head 5.00 ax · axial · 0.34mm/px · z∈[-510,-407]mm · 7 of 29 slices shown, 9 images]
[im 4/29  brain]
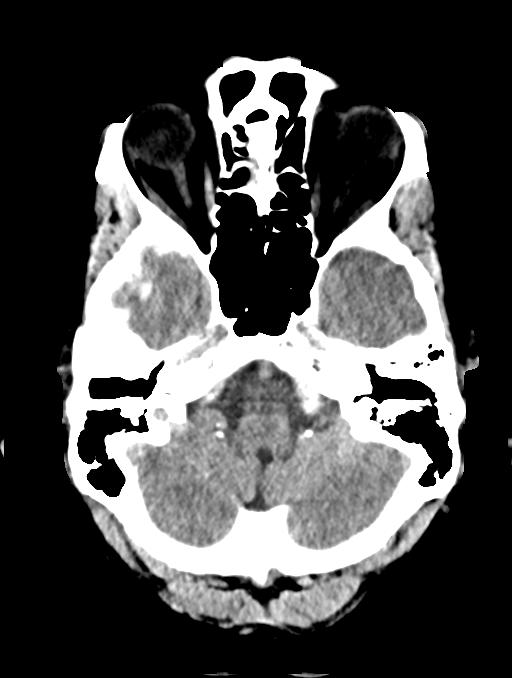
[im 4/29  bone]
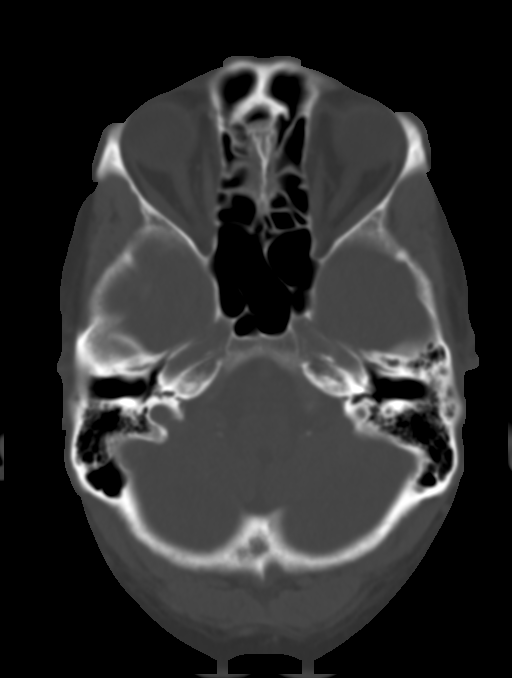
[im 8/29  brain]
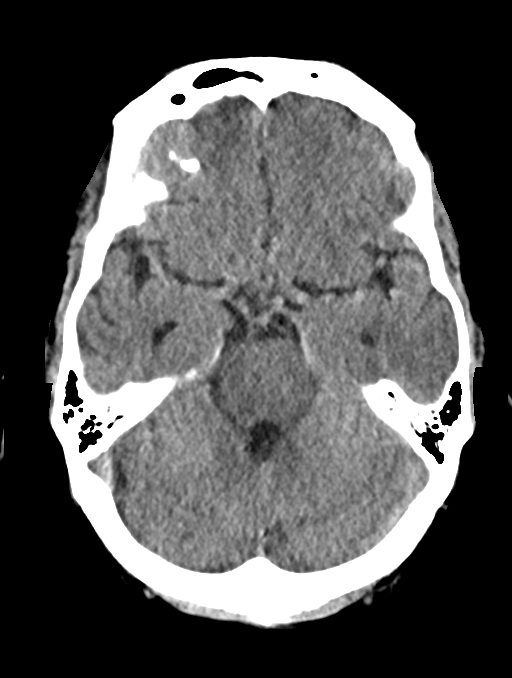
[im 11/29  brain]
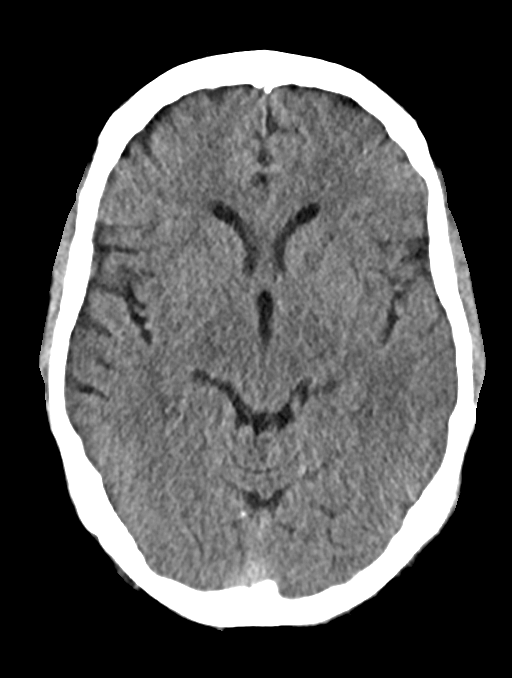
[im 15/29  brain]
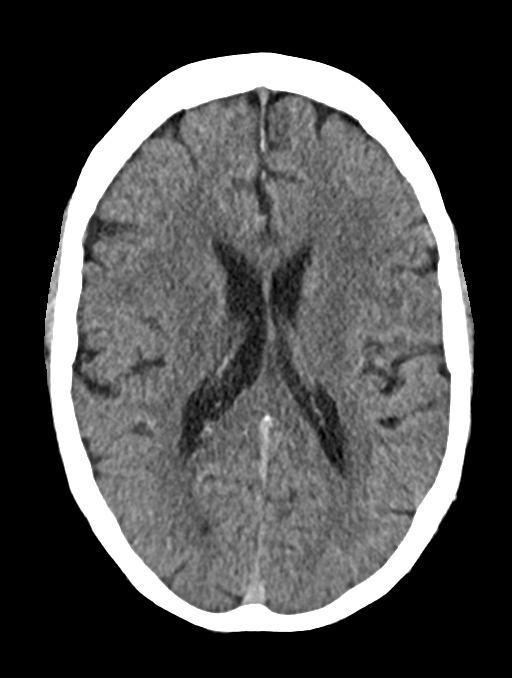
[im 18/29  brain]
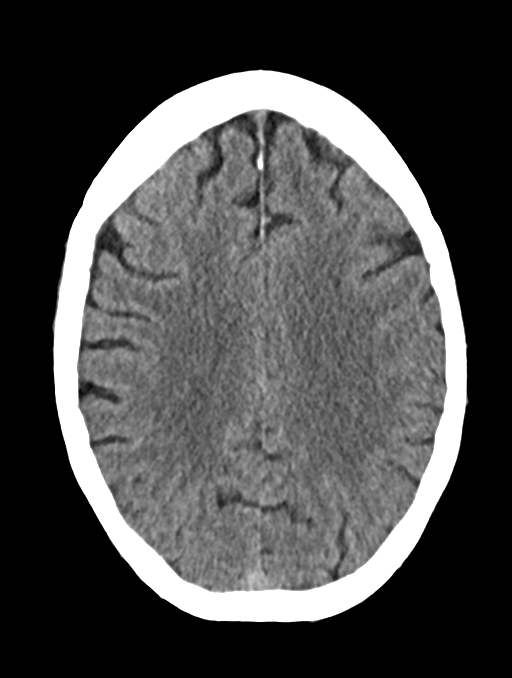
[im 18/29  bone]
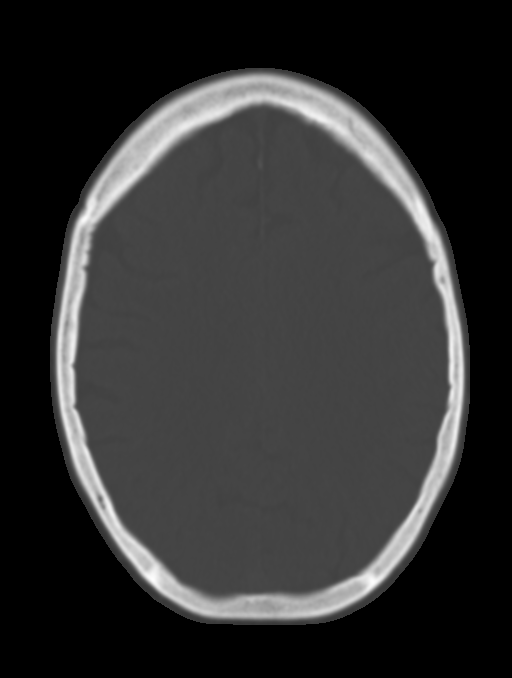
[im 22/29  brain]
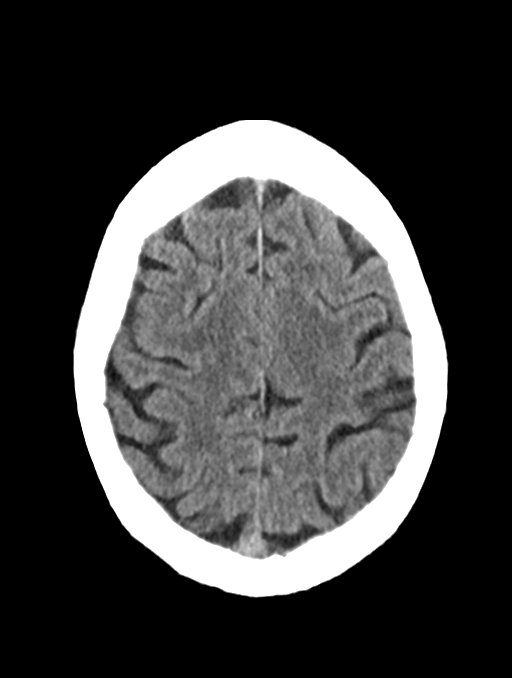
[im 25/29  brain]
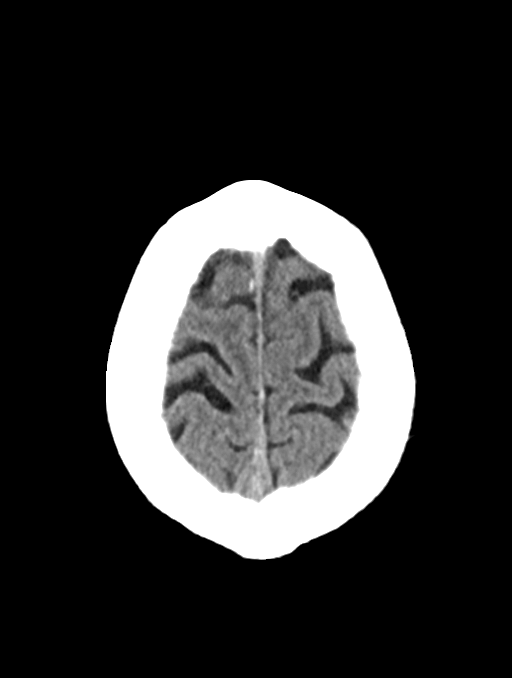

[Series 6: coronals head 3.00 cor · coronal · 0.29mm/px · 3 of 77 slices shown]
[im 26/77  brain]
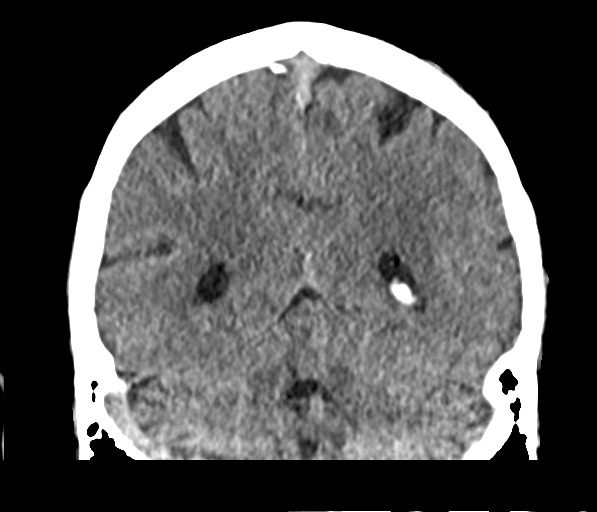
[im 34/77  brain]
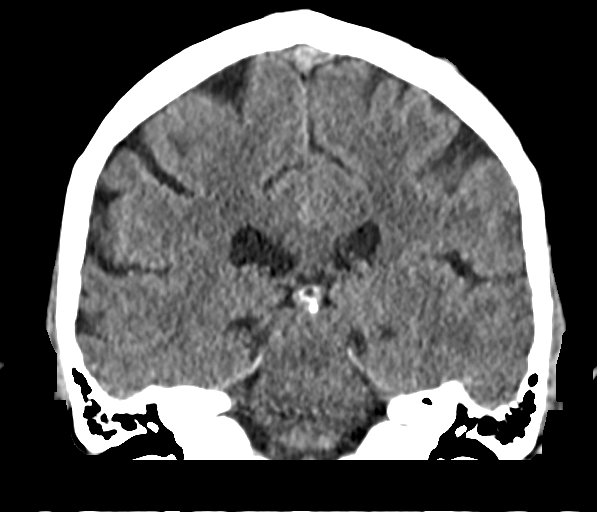
[im 43/77  brain]
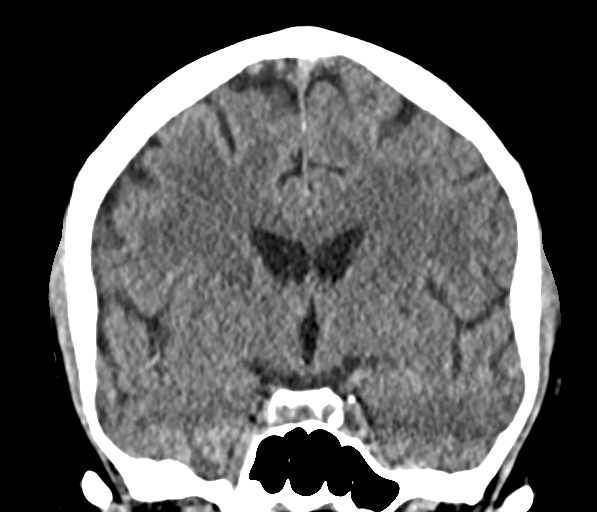

[Series 8: sagittals head 3.00 sag · sagittal · 0.29mm/px · 3 of 58 slices shown]
[im 20/58  brain]
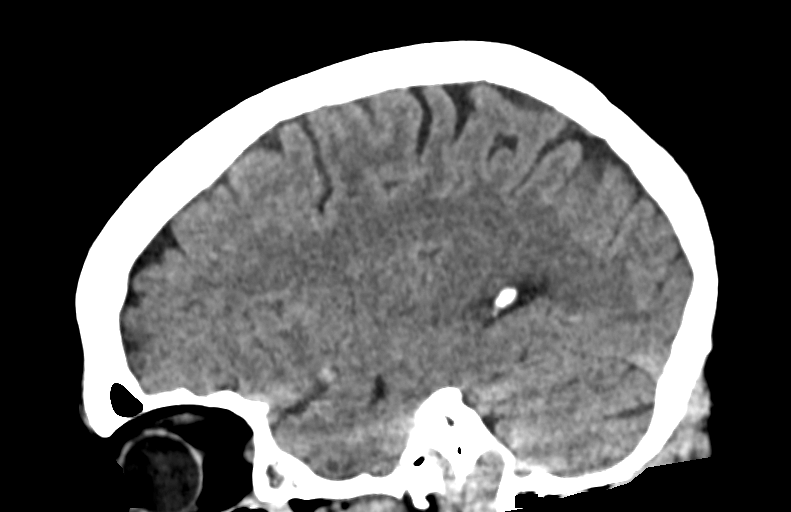
[im 29/58  brain]
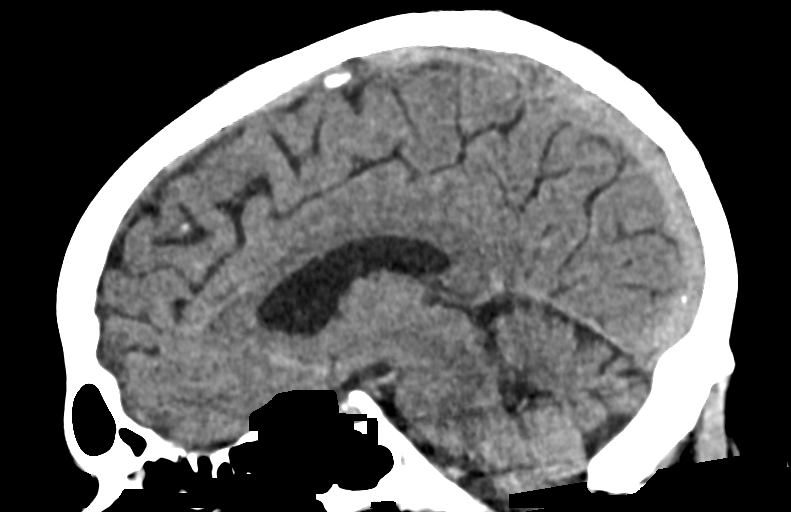
[im 39/58  brain]
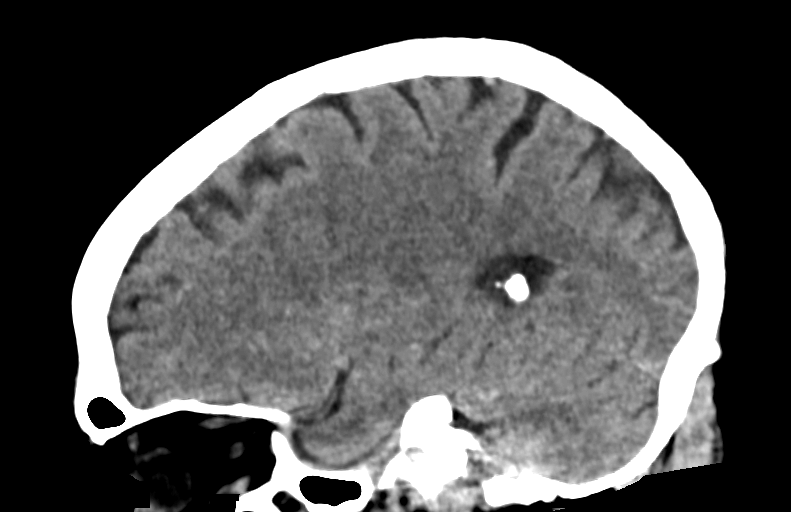

[13 of 47 positions shown; findings below may reference images not displayed]

FINDINGS: Brain: No evidence of acute large vascular territory infarction,
hemorrhage, hydrocephalus, extra-axial collection or mass
lesion/mass effect. Minimal age related global parenchymal volume
loss. Stable mild burden of chronic microvascular white matter
ischemic disease.

Vascular: No hyperdense vessel. Atherosclerotic calcifications of
the internal carotid arteries at the skull base.

Skull: Normal. Negative for fracture or focal lesion.

Sinuses/Orbits: Paranasal sinuses and mastoid air cells are
predominantly clear. Orbits are grossly unremarkable.

Other: None
IMPRESSION: 1. No acute intracranial findings.
2. Stable mild burden of chronic microvascular white matter ischemic
disease.

## 2021-02-26 ENCOUNTER — Other Ambulatory Visit: Payer: Self-pay

## 2021-02-26 ENCOUNTER — Encounter: Payer: Self-pay | Admitting: Emergency Medicine

## 2021-02-26 ENCOUNTER — Emergency Department: Payer: Medicare HMO

## 2021-02-26 ENCOUNTER — Observation Stay
Admission: EM | Admit: 2021-02-26 | Discharge: 2021-02-28 | Disposition: A | Discharge status: Home / Self Care | Payer: Medicare HMO | Attending: Internal Medicine | Admitting: Internal Medicine

## 2021-02-26 ENCOUNTER — Observation Stay: Payer: Medicare HMO

## 2021-02-26 DIAGNOSIS — N401 Enlarged prostate with lower urinary tract symptoms: Secondary | ICD-10-CM | POA: Diagnosis not present

## 2021-02-26 DIAGNOSIS — I10 Essential (primary) hypertension: Secondary | ICD-10-CM | POA: Diagnosis not present

## 2021-02-26 DIAGNOSIS — R55 Syncope and collapse: Principal | ICD-10-CM

## 2021-02-26 DIAGNOSIS — Z85828 Personal history of other malignant neoplasm of skin: Secondary | ICD-10-CM | POA: Diagnosis not present

## 2021-02-26 DIAGNOSIS — N138 Other obstructive and reflux uropathy: Secondary | ICD-10-CM

## 2021-02-26 DIAGNOSIS — R008 Other abnormalities of heart beat: Secondary | ICD-10-CM | POA: Insufficient documentation

## 2021-02-26 DIAGNOSIS — Z87891 Personal history of nicotine dependence: Secondary | ICD-10-CM | POA: Diagnosis not present

## 2021-02-26 DIAGNOSIS — R0600 Dyspnea, unspecified: Secondary | ICD-10-CM | POA: Insufficient documentation

## 2021-02-26 DIAGNOSIS — Z7982 Long term (current) use of aspirin: Secondary | ICD-10-CM | POA: Diagnosis not present

## 2021-02-26 DIAGNOSIS — Z20822 Contact with and (suspected) exposure to covid-19: Secondary | ICD-10-CM | POA: Diagnosis not present

## 2021-02-26 DIAGNOSIS — Z79899 Other long term (current) drug therapy: Secondary | ICD-10-CM | POA: Insufficient documentation

## 2021-02-26 DIAGNOSIS — N179 Acute kidney failure, unspecified: Secondary | ICD-10-CM

## 2021-02-26 DIAGNOSIS — I498 Other specified cardiac arrhythmias: Secondary | ICD-10-CM

## 2021-02-26 DIAGNOSIS — G4730 Sleep apnea, unspecified: Secondary | ICD-10-CM | POA: Diagnosis present

## 2021-02-26 DIAGNOSIS — Z9889 Other specified postprocedural states: Secondary | ICD-10-CM

## 2021-02-26 DIAGNOSIS — E876 Hypokalemia: Secondary | ICD-10-CM

## 2021-02-26 DIAGNOSIS — G4733 Obstructive sleep apnea (adult) (pediatric): Secondary | ICD-10-CM

## 2021-02-26 DIAGNOSIS — E785 Hyperlipidemia, unspecified: Secondary | ICD-10-CM | POA: Diagnosis present

## 2021-02-26 DIAGNOSIS — E782 Mixed hyperlipidemia: Secondary | ICD-10-CM

## 2021-02-26 DIAGNOSIS — R0602 Shortness of breath: Secondary | ICD-10-CM

## 2021-02-26 DIAGNOSIS — N529 Male erectile dysfunction, unspecified: Secondary | ICD-10-CM | POA: Diagnosis present

## 2021-02-26 LAB — CBC
HCT: 49.3 % (ref 39.0–52.0)
Hemoglobin: 17 g/dL (ref 13.0–17.0)
MCH: 30.9 pg (ref 26.0–34.0)
MCHC: 34.5 g/dL (ref 30.0–36.0)
MCV: 89.6 fL (ref 80.0–100.0)
Platelets: 163 10*3/uL (ref 150–400)
RBC: 5.5 MIL/uL (ref 4.22–5.81)
RDW: 12.8 % (ref 11.5–15.5)
WBC: 6.4 10*3/uL (ref 4.0–10.5)
nRBC: 0 % (ref 0.0–0.2)

## 2021-02-26 LAB — BASIC METABOLIC PANEL
Anion gap: 11 (ref 5–15)
BUN: 19 mg/dL (ref 8–23)
CO2: 25 mmol/L (ref 22–32)
Calcium: 9.5 mg/dL (ref 8.9–10.3)
Chloride: 102 mmol/L (ref 98–111)
Creatinine, Ser: 1.34 mg/dL — ABNORMAL HIGH (ref 0.61–1.24)
GFR, Estimated: 56 mL/min — ABNORMAL LOW (ref >60)
Glucose, Bld: 115 mg/dL — ABNORMAL HIGH (ref 70–99)
Potassium: 3.3 mmol/L — ABNORMAL LOW (ref 3.5–5.1)
Sodium: 138 mmol/L (ref 135–145)

## 2021-02-26 LAB — MAGNESIUM: Magnesium: 2.2 mg/dL (ref 1.7–2.4)

## 2021-02-26 LAB — URINALYSIS, COMPLETE (UACMP) WITH MICROSCOPIC
Bacteria, UA: NONE SEEN
Bilirubin Urine: NEGATIVE
Glucose, UA: NEGATIVE mg/dL
Hgb urine dipstick: NEGATIVE
Ketones, ur: 5 mg/dL — AB
Leukocytes,Ua: NEGATIVE
Nitrite: NEGATIVE
Protein, ur: NEGATIVE mg/dL
Specific Gravity, Urine: 1.014 (ref 1.005–1.030)
Squamous Epithelial / HPF: NONE SEEN (ref 0–5)
pH: 6 (ref 5.0–8.0)

## 2021-02-26 LAB — BRAIN NATRIURETIC PEPTIDE: B Natriuretic Peptide: 129.6 pg/mL — ABNORMAL HIGH (ref 0.0–100.0)

## 2021-02-26 LAB — TROPONIN I (HIGH SENSITIVITY)
Troponin I (High Sensitivity): 8 ng/L (ref <18)
Troponin I (High Sensitivity): 7 ng/L (ref <18)

## 2021-02-26 LAB — HEPATIC FUNCTION PANEL
ALT: 14 U/L (ref 0–44)
AST: 20 U/L (ref 15–41)
Albumin: 4.3 g/dL (ref 3.5–5.0)
Alkaline Phosphatase: 31 U/L — ABNORMAL LOW (ref 38–126)
Bilirubin, Direct: 0.3 mg/dL — ABNORMAL HIGH (ref 0.0–0.2)
Indirect Bilirubin: 1.8 mg/dL — ABNORMAL HIGH (ref 0.3–0.9)
Total Bilirubin: 2.1 mg/dL — ABNORMAL HIGH (ref 0.3–1.2)
Total Protein: 7 g/dL (ref 6.5–8.1)

## 2021-02-26 LAB — TSH: TSH: 1.718 u[IU]/mL (ref 0.350–4.500)

## 2021-02-26 LAB — T4, FREE: Free T4: 1.11 ng/dL (ref 0.61–1.12)

## 2021-02-26 LAB — VITAMIN B12: Vitamin B-12: 155 pg/mL — ABNORMAL LOW (ref 180–914)

## 2021-02-26 IMAGING — CR DG CHEST 2V
1 series · 2 of 2 positions shown · non-contrast
Comparison: [DATE].

CLINICAL DATA: Syncope.

EXAM:
CHEST - 2 VIEW

[Series 1: dg chest 2 view · 0.14mm/px · 2 of 2 slices shown]
[im 1/2]
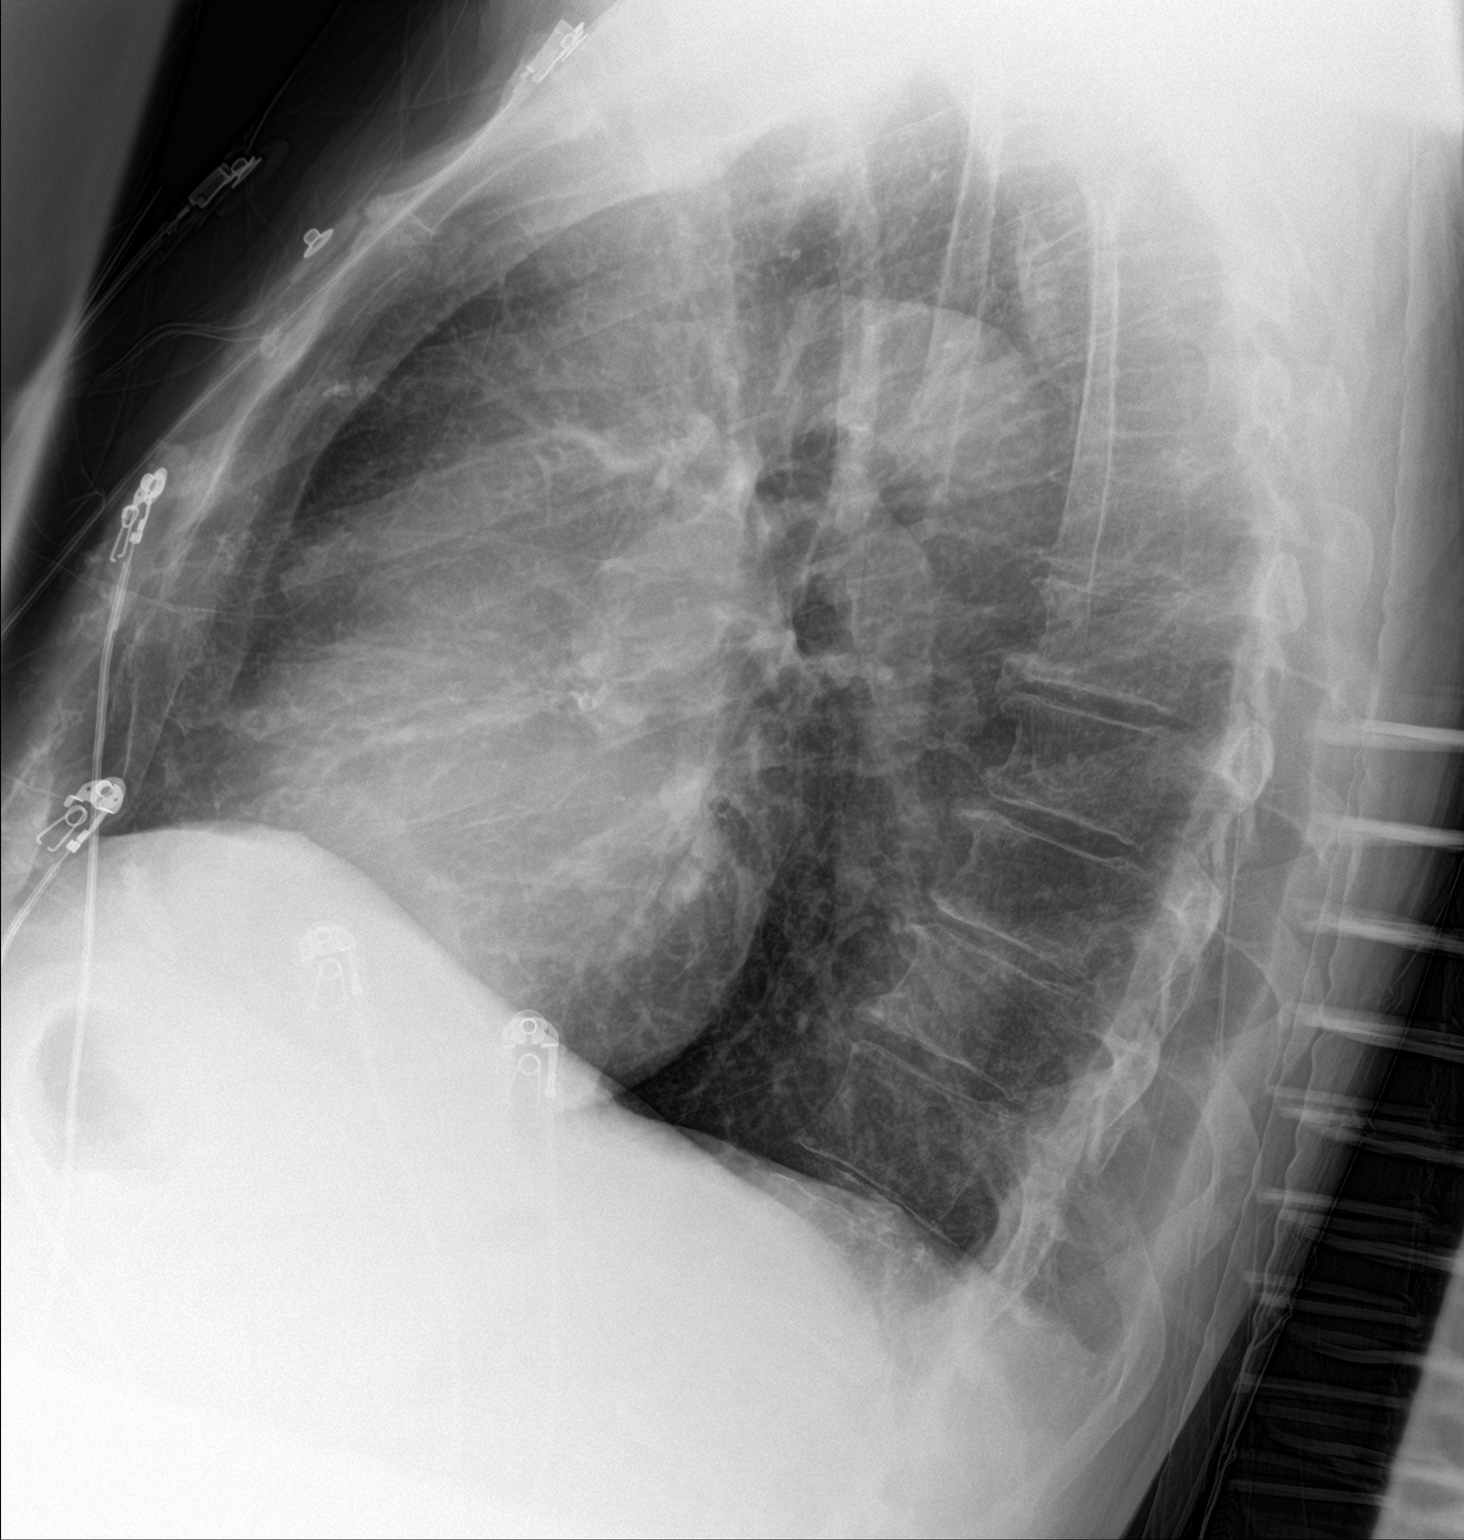
[im 2/2]
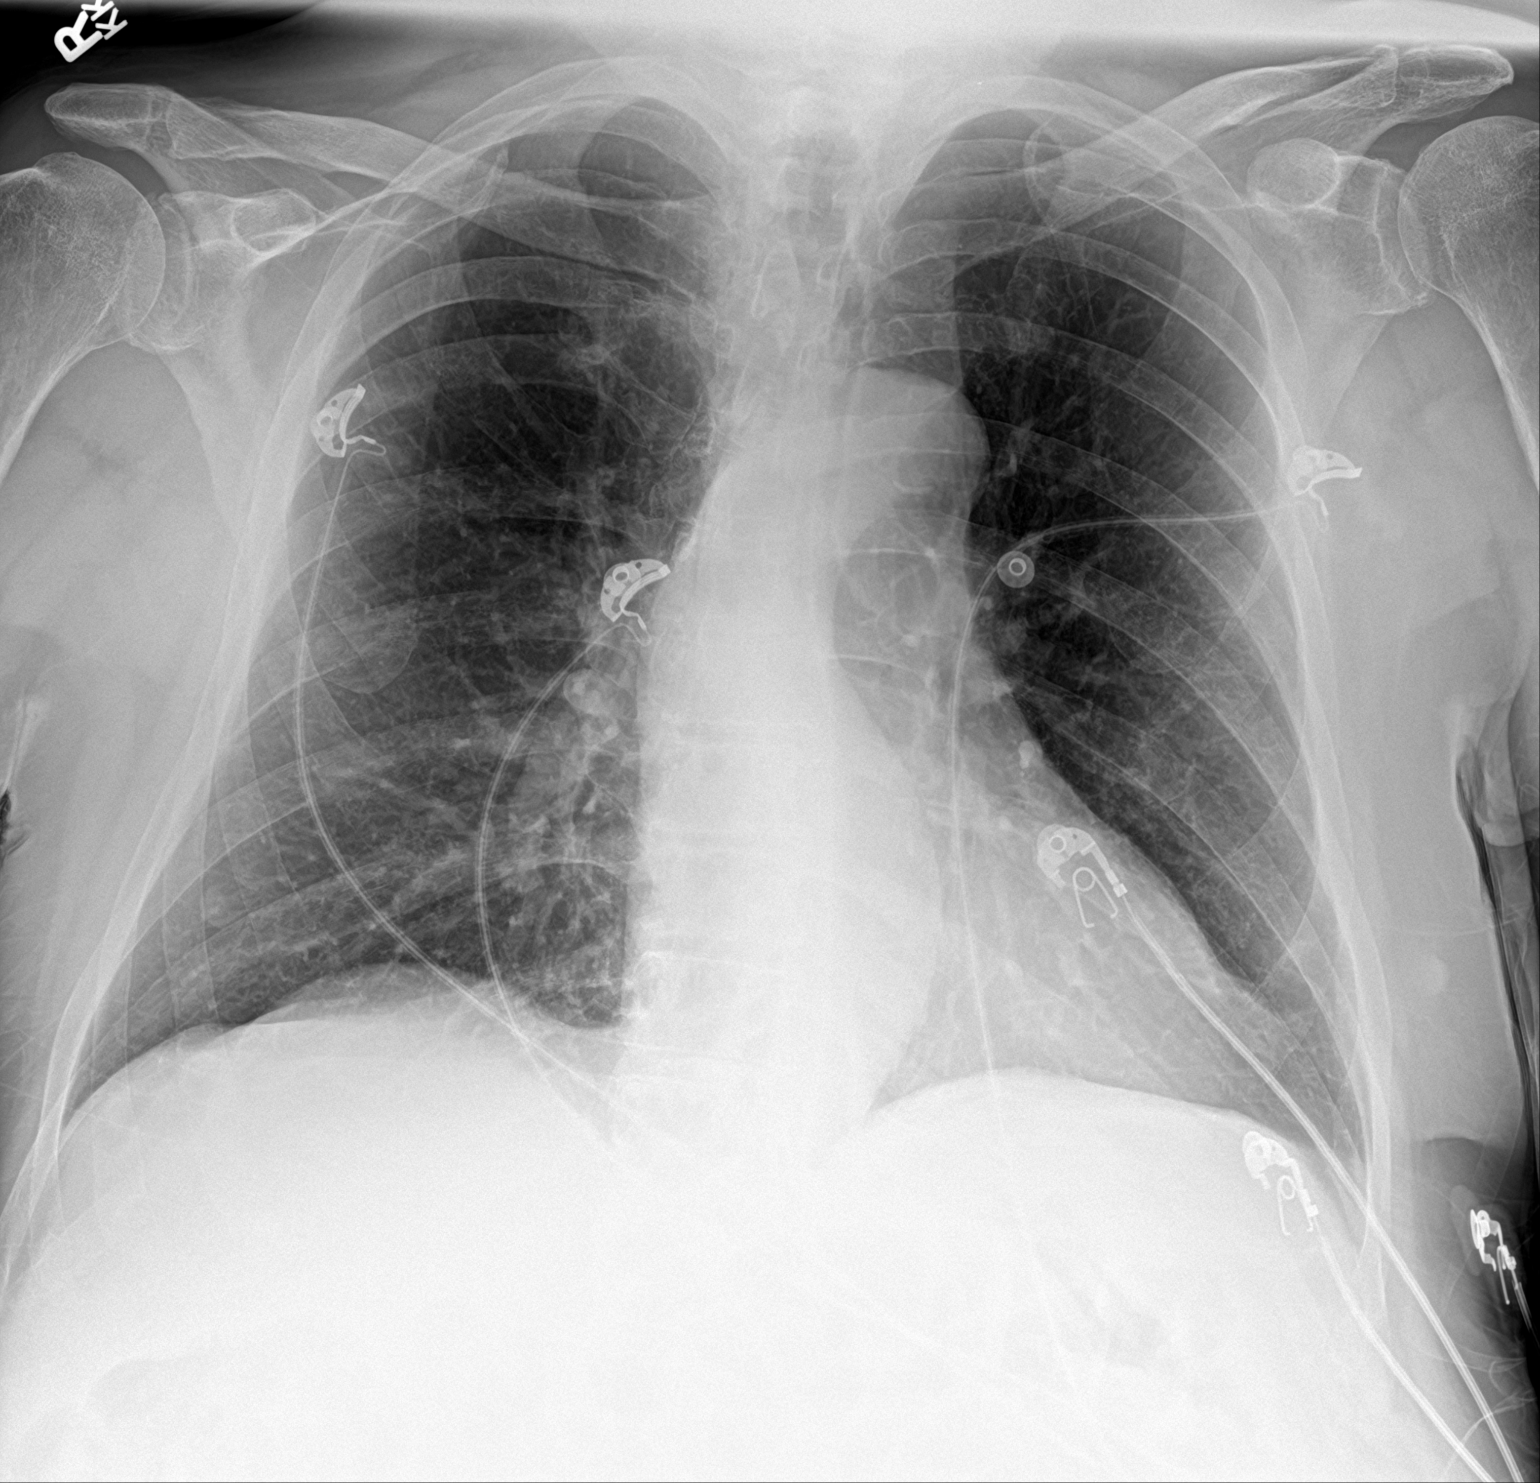

[2 of 2 positions shown; findings below may reference images not displayed]

FINDINGS: The heart size and mediastinal contours are within normal limits.
Both lungs are clear. The visualized skeletal structures are
unremarkable.
IMPRESSION: No active cardiopulmonary disease.

## 2021-02-26 IMAGING — MR MR HEAD W/O CM
13 series · 48 of 48 positions shown · non-contrast
Comparison: [FT]

CLINICAL DATA: Recurrent syncope

EXAM:
MRI HEAD WITHOUT CONTRAST
TECHNIQUE: Multiplanar, multiecho pulse sequences of the brain and surrounding
structures were obtained without intravenous contrast.

[Series 5: ax dwi_tracew · axial · 3.0mm · 0.65mm/px · z∈[-58,+97]mm · 2 of 48 slices shown]
[im 1/48]
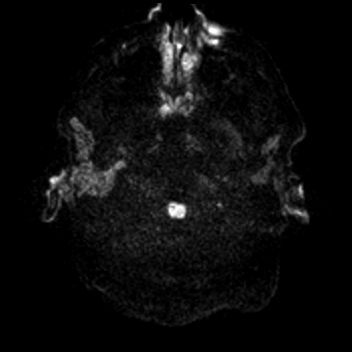
[im 48/48]
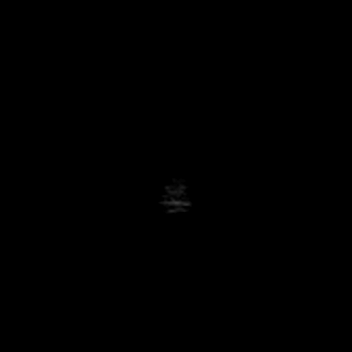

[Series 6: ax dwi_adc · axial · 3.0mm · 0.65mm/px · z∈[-58,+97]mm · 2 of 48 slices shown]
[im 1/48]
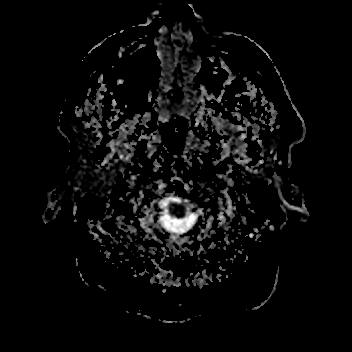
[im 48/48]
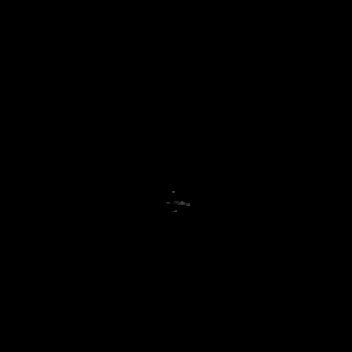

[Series 7: cor dwi_tracew · coronal · 5.0mm · 0.60mm/px · 3 of 38 slices shown]
[im 1/38]
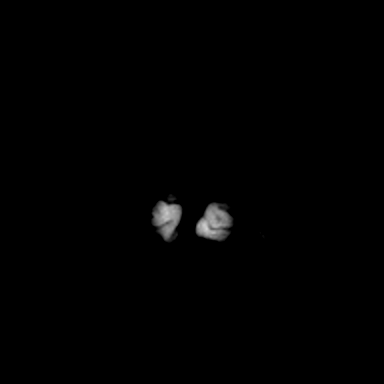
[im 19/38]
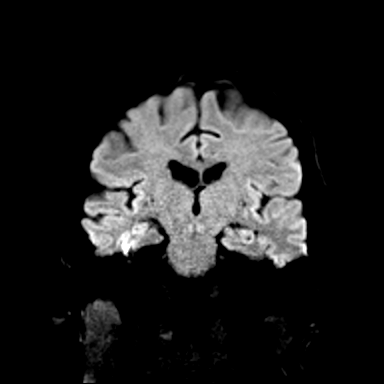
[im 38/38]
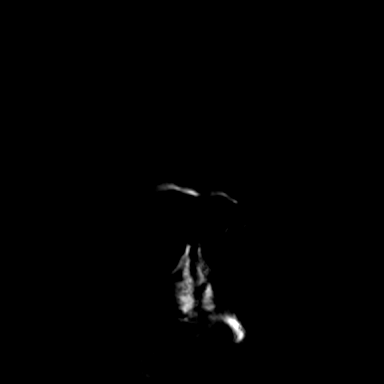

[Series 8: cor dwi_adc · coronal · 5.0mm · 0.60mm/px · 3 of 38 slices shown]
[im 1/38]
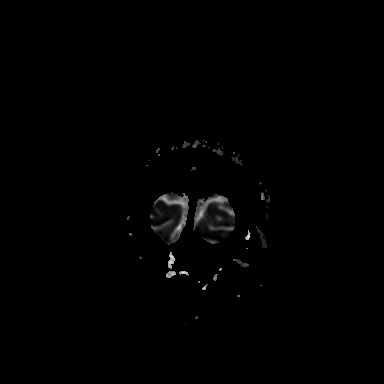
[im 19/38]
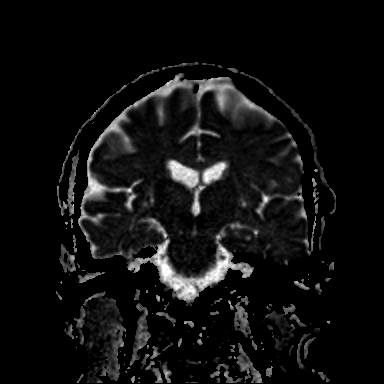
[im 38/38]
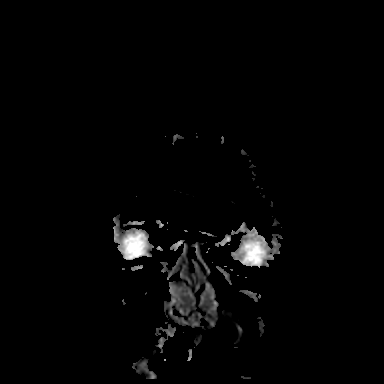

[Series 9: T1 · sagittal · 5.0mm · 0.62mm/px · 2 of 24 slices shown (1 of 2)]
[im 1/24]
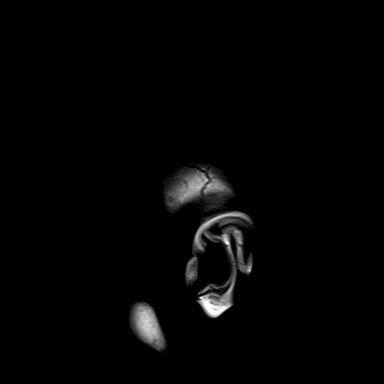
[im 24/24]
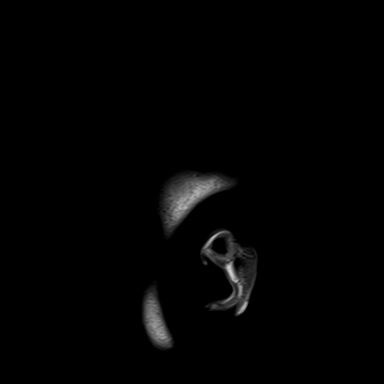

[Series 10: T2 · axial · 5.0mm · 0.55mm/px · z∈[-52,+91]mm · 2 of 25 slices shown (1 of 2)]
[im 1/25]
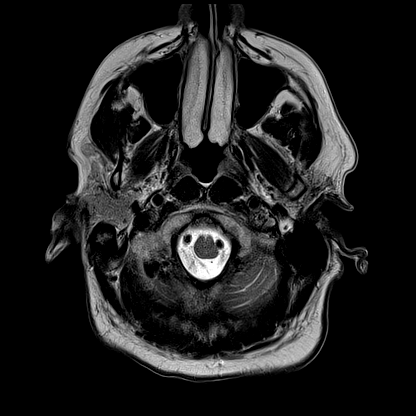
[im 25/25]
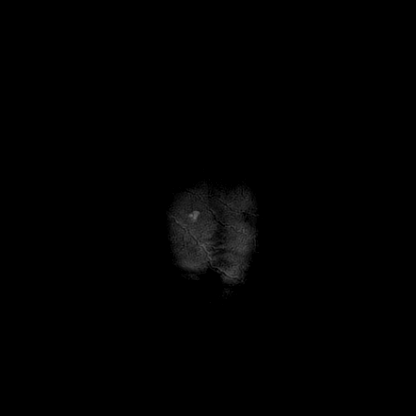

[Series 11: mag_images · axial · 3.0mm · 0.90mm/px · z∈[-68,+108]mm · 4 of 60 slices shown]
[im 1/60]
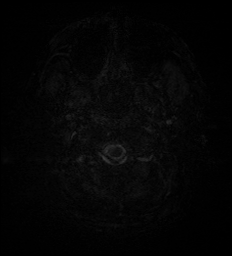
[im 20/60]
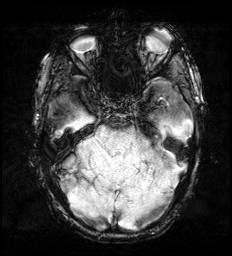
[im 40/60]
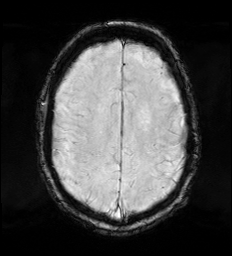
[im 60/60]
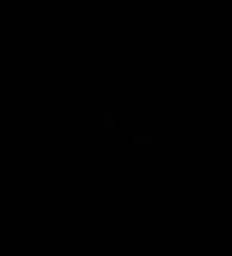

[Series 12: pha_images · axial · 3.0mm · 0.90mm/px · z∈[-68,+108]mm · 4 of 60 slices shown]
[im 1/60]
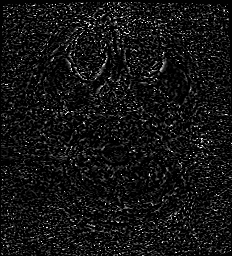
[im 20/60]
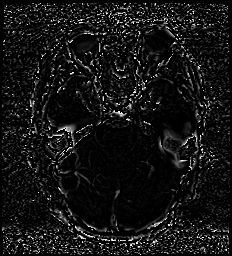
[im 40/60]
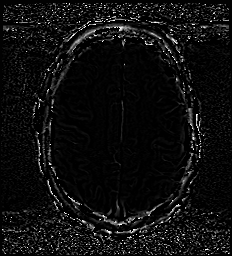
[im 60/60]
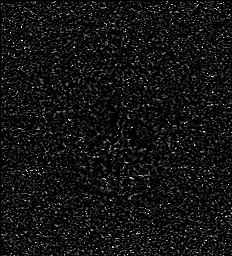

[Series 13: swi_images · axial · 3.0mm · 0.90mm/px · z∈[-68,+108]mm · 4 of 60 slices shown]
[im 1/60]
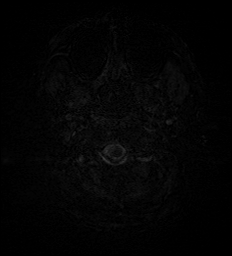
[im 20/60]
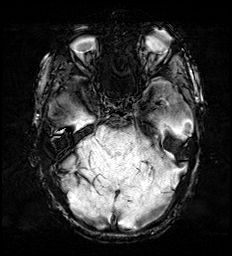
[im 40/60]
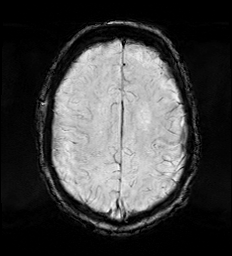
[im 60/60]
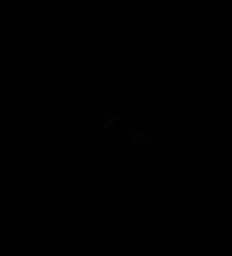

[Series 14: mip_images(sw) · axial · 24.0mm · 0.90mm/px · z∈[-58,+98]mm · 4 of 53 slices shown]
[im 1/53]
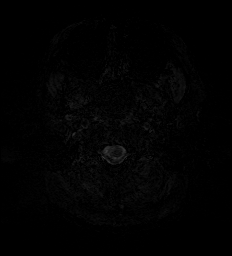
[im 18/53]
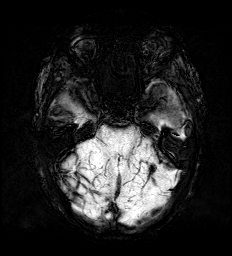
[im 35/53]
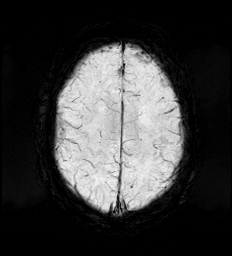
[im 53/53]
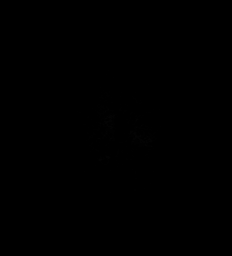

[Series 15: FLAIR · axial · 3.0mm · 0.53mm/px · z∈[-61,+100]mm · 4 of 55 slices shown]
[im 1/55]
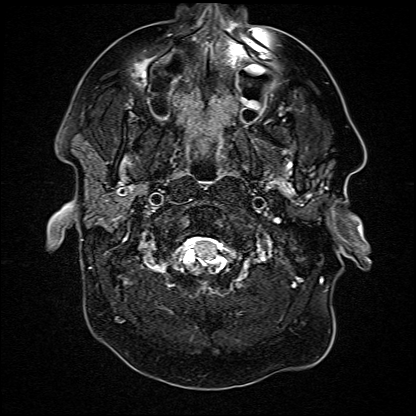
[im 19/55]
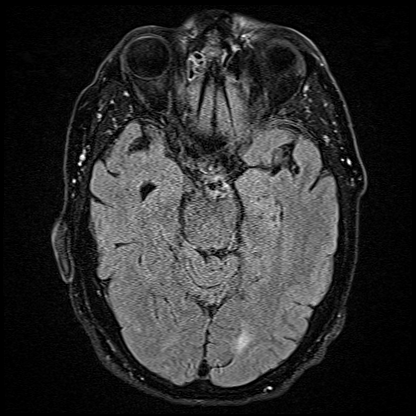
[im 37/55]
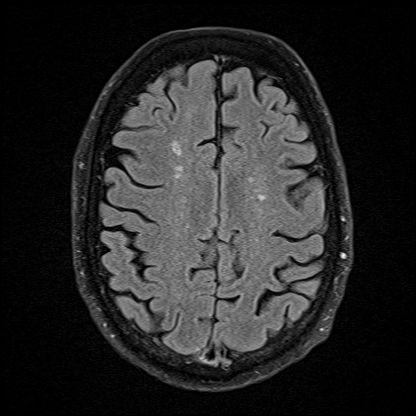
[im 55/55]
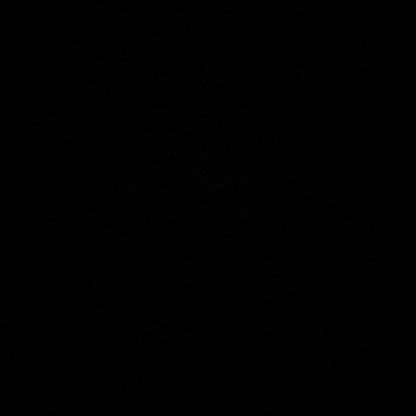

[Series 16: T1 · axial · 1.0mm · 0.98mm/px · z∈[-68,+107]mm · 12 of 173 slices shown (2 of 2)]
[im 1/173]
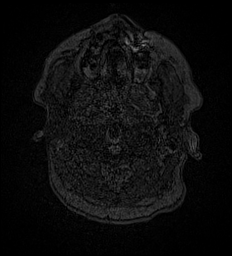
[im 16/173]
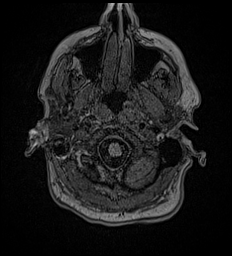
[im 32/173]
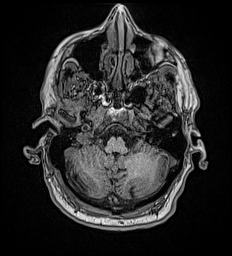
[im 47/173]
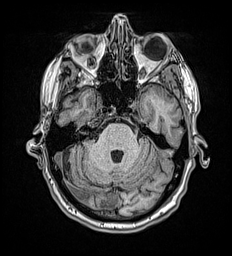
[im 63/173]
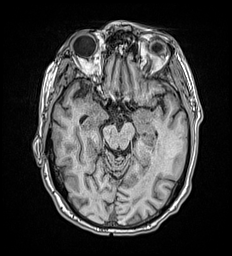
[im 79/173]
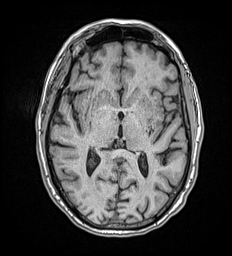
[im 94/173]
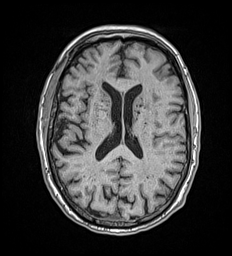
[im 110/173]
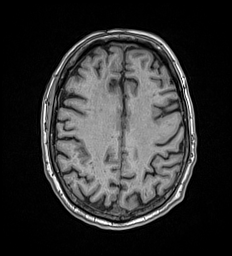
[im 126/173]
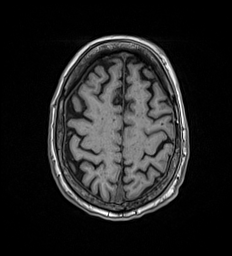
[im 141/173]
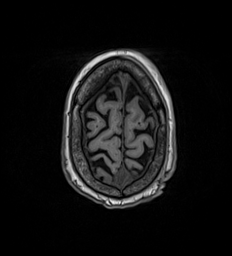
[im 157/173]
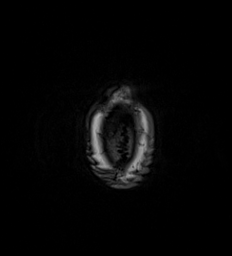
[im 173/173]
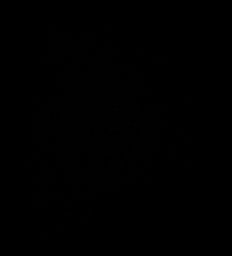

[Series 17: T2 · coronal · 5.0mm · 0.57mm/px · 2 of 29 slices shown (2 of 2)]
[im 1/29]
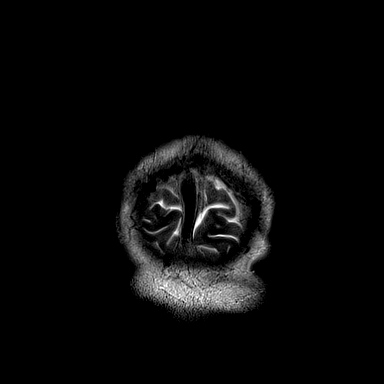
[im 29/29]
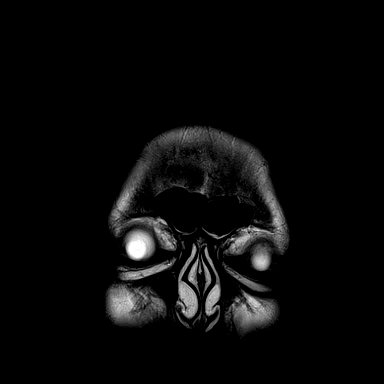

[48 of 48 positions shown; findings below may reference images not displayed]

FINDINGS: Brain: There is no acute infarction or intracranial hemorrhage.
There is no intracranial mass, mass effect, or edema. There is no
hydrocephalus or extra-axial fluid collection. Ventricles and sulci
are within normal limits in size and configuration. Patchy foci of
T2 hyperintensity in the supratentorial white matter are nonspecific
but may reflect mild chronic microvascular ischemic changes. Small
chronic right frontal cortical infarct. New small chronic right
cerebellar infarct.

Vascular: Major vessel flow voids at the skull base are preserved.

Skull and upper cervical spine: Normal marrow signal is preserved.

Sinuses/Orbits: Minor mucosal thickening.  Orbits are unremarkable.

Other: Sella is unremarkable.  Mastoid air cells are clear.
IMPRESSION: No evidence of recent infarction, hemorrhage, or mass.

Mild chronic microvascular ischemic changes. Small chronic right
frontal infarct. New small chronic right cerebellar infarct.

## 2021-02-26 IMAGING — CT CT HEAD W/O CM
4 series · 17 of 47 positions shown, 19 images · non-contrast
Comparison: [DATE].

CLINICAL DATA: Altered mental status.  Syncope.

EXAM:
CT HEAD WITHOUT CONTRAST
TECHNIQUE: Contiguous axial images were obtained from the base of the skull
through the vertex without intravenous contrast.

[Series 2: head bone · axial · 0.42mm/px · z∈[-97,-47]mm · 4 of 74 slices shown]
[im 8/74  bone]
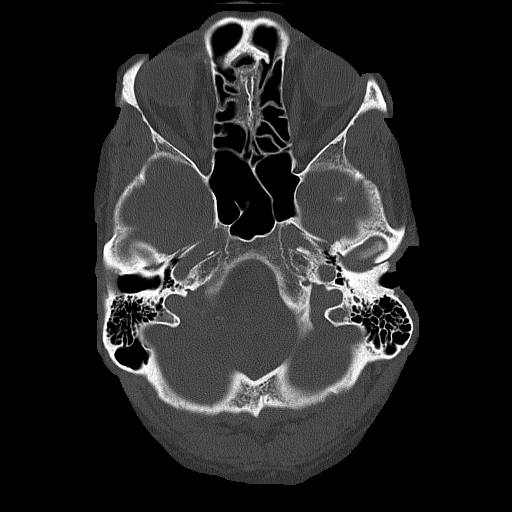
[im 15/74  bone]
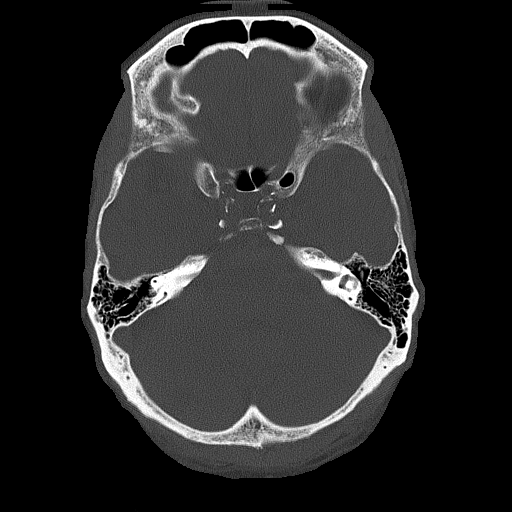
[im 22/74  bone]
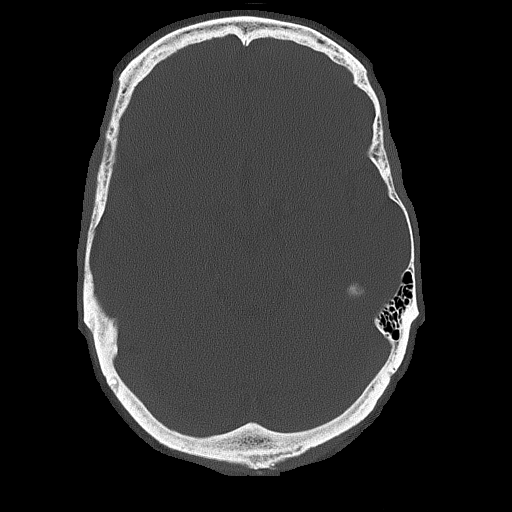
[im 33/74  bone]
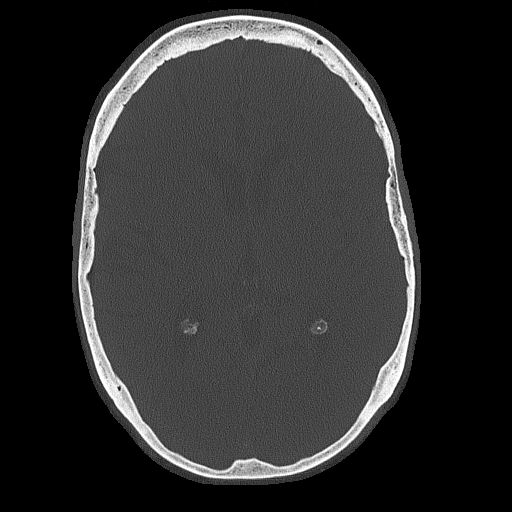

[Series 3: head wo · axial · 0.42mm/px · z∈[-96,+14]mm · 7 of 30 slices shown, 9 images]
[im 4/30  brain]
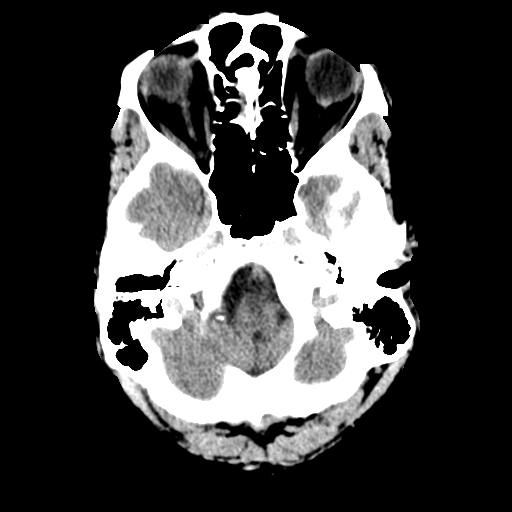
[im 4/30  bone]
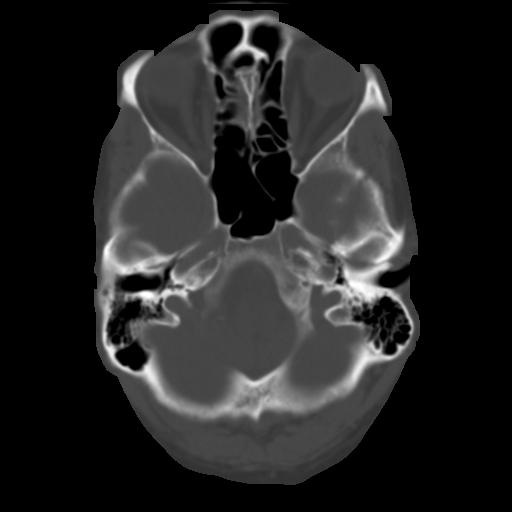
[im 8/30  brain]
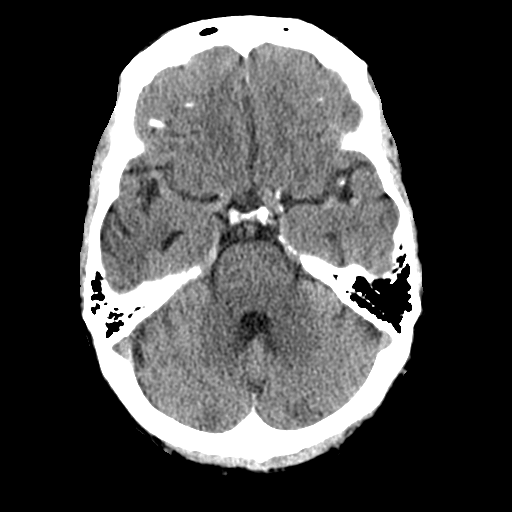
[im 11/30  brain]
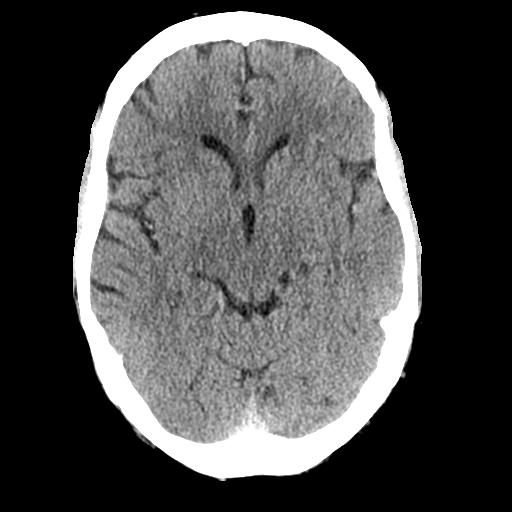
[im 15/30  brain]
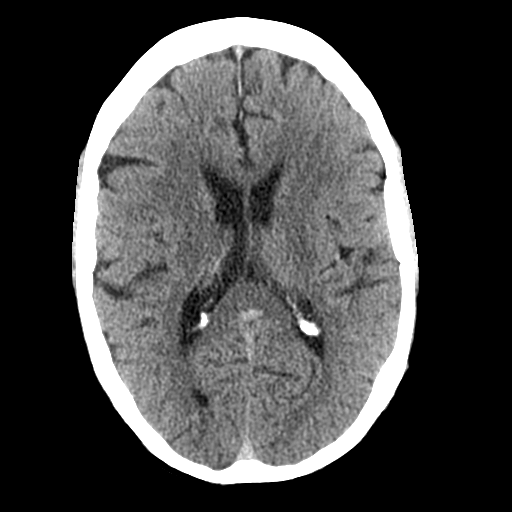
[im 19/30  brain]
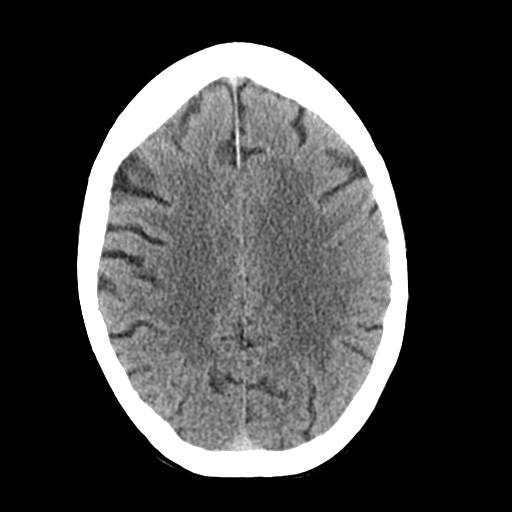
[im 19/30  bone]
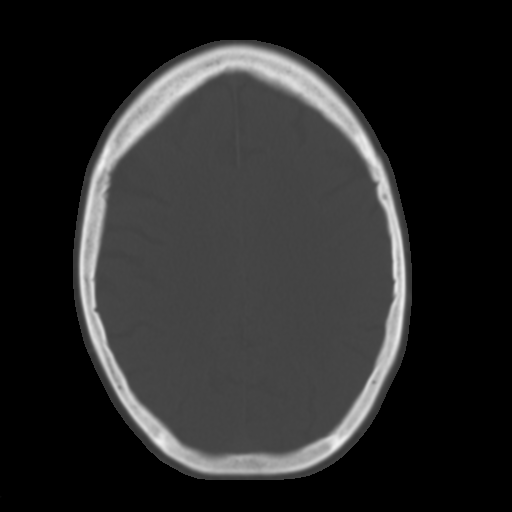
[im 22/30  brain]
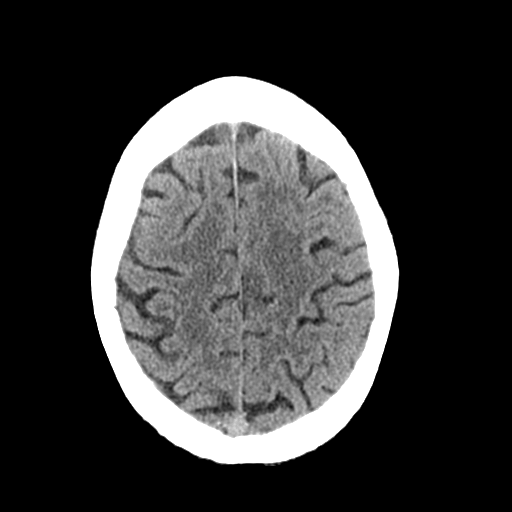
[im 26/30  brain]
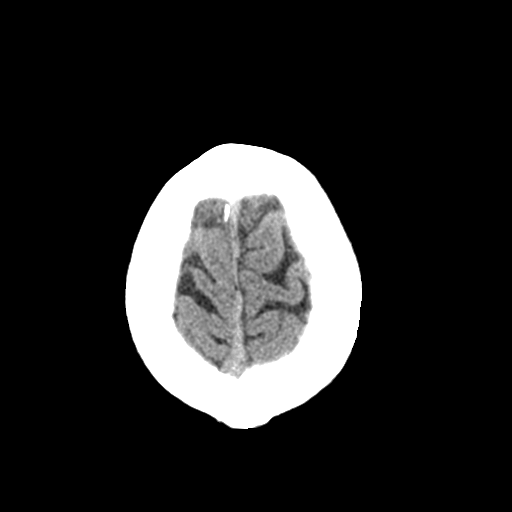

[Series 4: coronal soft tissue · coronal · 0.32mm/px · 3 of 72 slices shown]
[im 24/72  brain]
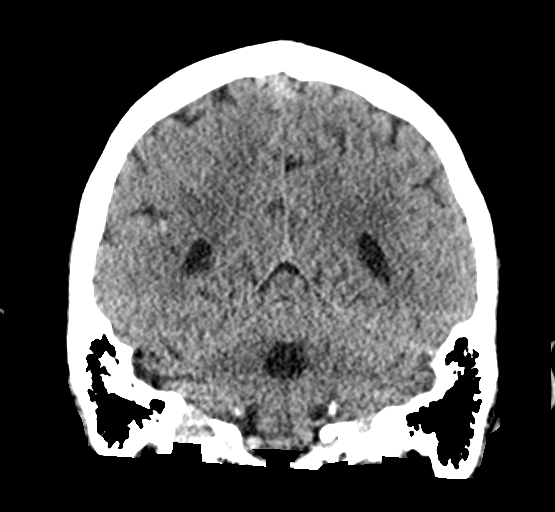
[im 32/72  brain]
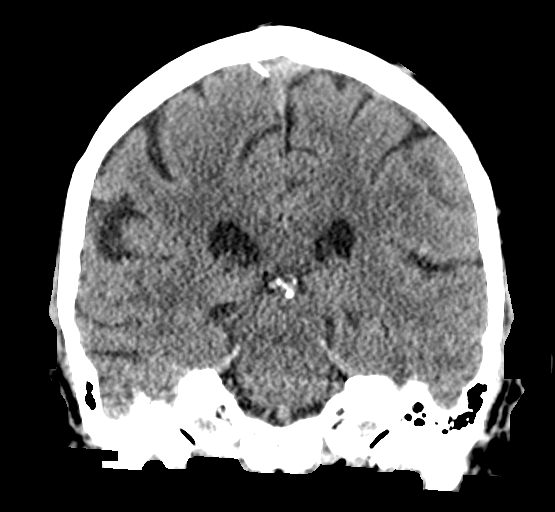
[im 40/72  brain]
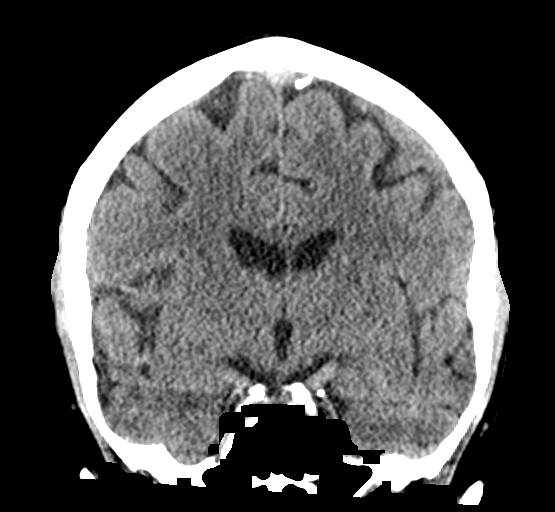

[Series 5: sagittal soft tissue · sagittal · 0.32mm/px · 3 of 61 slices shown]
[im 21/61  brain]
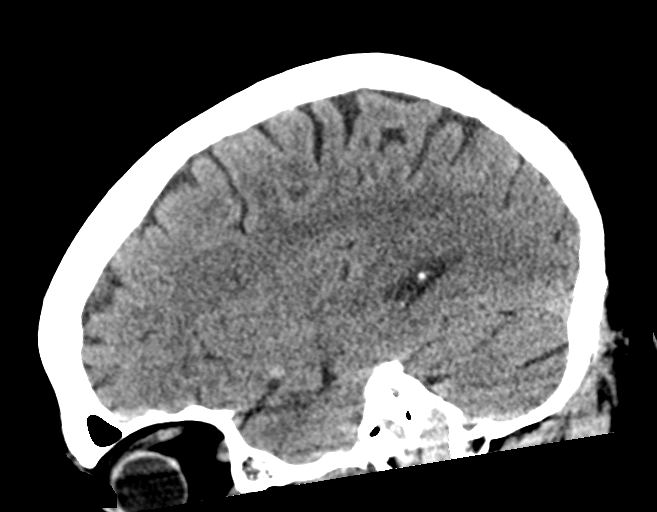
[im 31/61  brain]
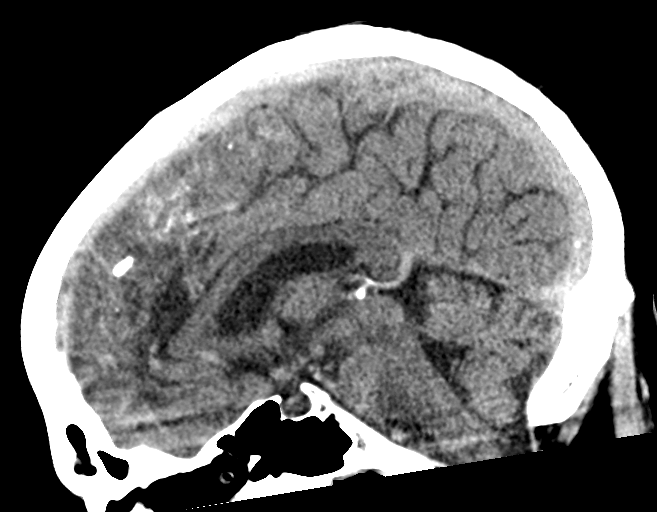
[im 41/61  brain]
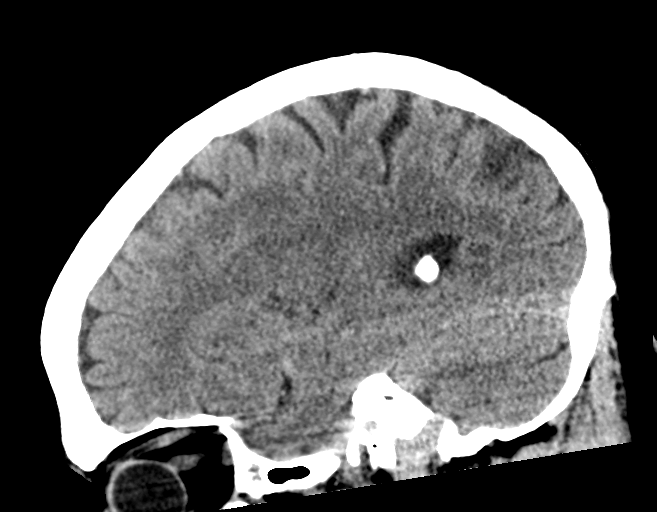

[17 of 47 positions shown; findings below may reference images not displayed]

FINDINGS: Brain: No evidence of acute infarction, hemorrhage, hydrocephalus,
extra-axial collection or mass lesion/mass effect.

Vascular: No hyperdense vessel or unexpected calcification.

Skull: Normal. Negative for fracture or focal lesion.

Sinuses/Orbits: No acute finding.

Other: None.
IMPRESSION: No acute intracranial abnormality seen.

## 2021-02-26 MED ORDER — PANTOPRAZOLE SODIUM 40 MG PO TBEC
40.0000 mg | DELAYED_RELEASE_TABLET | Freq: Two times a day (BID) | ORAL | Status: DC
  Administered 2021-02-26 – 2021-02-28 (×4): 40 mg via ORAL
  Filled 2021-02-26 (×4): qty 1

## 2021-02-26 MED ORDER — POTASSIUM CHLORIDE 10 MEQ/100ML IV SOLN
10.0000 meq | INTRAVENOUS | Status: AC
  Administered 2021-02-26 (×2): 10 meq via INTRAVENOUS
  Filled 2021-02-26 (×2): qty 100

## 2021-02-26 MED ORDER — ENOXAPARIN SODIUM 40 MG/0.4ML IJ SOSY
40.0000 mg | PREFILLED_SYRINGE | INTRAMUSCULAR | Status: DC
  Administered 2021-02-27 (×2): 40 mg via SUBCUTANEOUS
  Filled 2021-02-26 (×2): qty 0.4

## 2021-02-26 MED ORDER — ONDANSETRON HCL 4 MG PO TABS: 4.0000 mg | ORAL_TABLET | Freq: Four times a day (QID) | ORAL | Status: DC | PRN

## 2021-02-26 MED ORDER — HYDRALAZINE HCL 25 MG PO TABS: 25.0000 mg | ORAL_TABLET | Freq: Three times a day (TID) | ORAL | Status: DC | PRN

## 2021-02-26 MED ORDER — ACETAMINOPHEN 325 MG PO TABS: 650.0000 mg | ORAL_TABLET | Freq: Four times a day (QID) | ORAL | Status: DC | PRN

## 2021-02-26 MED ORDER — SODIUM CHLORIDE 0.9 % IV BOLUS
500.0000 mL | Freq: Once | INTRAVENOUS | Status: AC
  Administered 2021-02-26: 500 mL via INTRAVENOUS

## 2021-02-26 MED ORDER — AMLODIPINE BESYLATE 10 MG PO TABS
10.0000 mg | ORAL_TABLET | Freq: Every day | ORAL | Status: DC
  Administered 2021-02-27 – 2021-02-28 (×2): 10 mg via ORAL
  Filled 2021-02-26 (×2): qty 1

## 2021-02-26 MED ORDER — ONDANSETRON HCL 4 MG/2ML IJ SOLN: 4.0000 mg | Freq: Four times a day (QID) | INTRAMUSCULAR | Status: DC | PRN

## 2021-02-26 MED ORDER — SODIUM CHLORIDE 0.9% FLUSH
3.0000 mL | Freq: Two times a day (BID) | INTRAVENOUS | Status: DC
  Administered 2021-02-27 – 2021-02-28 (×3): 3 mL via INTRAVENOUS

## 2021-02-26 MED ORDER — TRIAMTERENE-HCTZ 37.5-25 MG PO CAPS: 1.0000 | ORAL_CAPSULE | Freq: Every day | ORAL | Status: DC

## 2021-02-26 MED ORDER — ACETAMINOPHEN 650 MG RE SUPP: 650.0000 mg | Freq: Four times a day (QID) | RECTAL | Status: DC | PRN

## 2021-02-26 MED ORDER — ASPIRIN 81 MG PO CHEW
81.0000 mg | CHEWABLE_TABLET | Freq: Every day | ORAL | Status: DC
  Administered 2021-02-27 – 2021-02-28 (×2): 81 mg via ORAL
  Filled 2021-02-26 (×2): qty 1

## 2021-02-26 MED ORDER — DUTASTERIDE 0.5 MG PO CAPS
0.5000 mg | ORAL_CAPSULE | Freq: Every evening | ORAL | Status: DC
  Administered 2021-02-26 – 2021-02-27 (×2): 0.5 mg via ORAL
  Filled 2021-02-26 (×3): qty 1

## 2021-02-26 MED ORDER — POTASSIUM CHLORIDE CRYS ER 20 MEQ PO TBCR
40.0000 meq | EXTENDED_RELEASE_TABLET | Freq: Once | ORAL | Status: AC
  Administered 2021-02-26: 40 meq via ORAL
  Filled 2021-02-26: qty 2

## 2021-02-26 MED ORDER — SIMVASTATIN 20 MG PO TABS
20.0000 mg | ORAL_TABLET | Freq: Every evening | ORAL | Status: DC
  Administered 2021-02-26 – 2021-02-27 (×2): 20 mg via ORAL
  Filled 2021-02-26 (×2): qty 1
  Filled 2021-02-26: qty 2

## 2021-02-26 NOTE — ED Triage Notes (Signed)
Pt in via EMS from home with c/o syncopal episode. EMS reports this is the pts 3rd syncopal episode over the last 2 weeks and saw his cardiologist last week but has not gotten results from [facility]. 170/82, 98%RA, HR 62, FSBS 88

## 2021-02-26 NOTE — ED Triage Notes (Signed)
Pt comes into the ED via [facility] from home c/o syncopal episodes.  PT states he has had 3 episodes of syncope in the last 3 weeks.  Pt unsure why.  Pt did see his cardiologist last week and he had worn a Halter monitor for 48 hours, but he has not received the results yet. Pt denies any CP or SHOB.  Pt currently in [facility].

## 2021-02-26 NOTE — ED Notes (Signed)
Request made for transport to floor

## 2021-02-26 NOTE — ED Notes (Signed)
New request made for transport for after shift change.

## 2021-02-26 NOTE — H&P (Signed)
History and Physical   SABA NEUMAN ENM:[id number] DOB: 1946/02/06 DOA: 02/26/2021  PCP: [name], MD  Outpatient Specialists: Dr. Leanna Sato clinic cardiology Patient coming from: Home via EMS  I have personally briefly reviewed patient's old medical records in [place].  Chief Concern: Syncope  HPI: Tyrone Luna is a 75 y.o. male with medical history significant for BPH on alpha blockade, hypertension, hyperlipidemia, history of perforated diverticulitis status post Hartmann procedure, history of Hartmann takedown, OSA on CPAP, no CKD, presents emergency department for chief concerns of syncope.  02/14/21: Patient was sitting at his desk working on the computer and the next thing he knew he was laying on the floor.  He endorses hitting his head during this episode because he states that he had a skin tear on the right posterior/parietal area of his scalp.  Patient states that this happened at approximately 8:30 AM.  02/19/21: patient was sitting in a chair, watching TV, passed out, he woke up on his own. He does not know how long he was out.  He denies head trauma during this episode. This happened at approximately 6:30 PM.  02/26/21: he was working and went to pick up his paper shredder to [employer] and he remembered feeling lightheaded and dizzy.  The next thing he knew he woke up on his left side on the floor. He does not know if he hit his head.  This happened at approximately 8:30 AM.  With all three episodes, they were unwitnessed and therefore it is unknown if he had seizure activity and/or the duration of LOC. With all three episodes, he woke up sweating.  He denies urinary or bowel incontinence.  He has been on BPH medications for years and takes it at in the evening at, 4 pm and antihypertensive in the morning, approximately 8 AM. There has been no changes to these medications recently.  He reports this has never happened before. He denies  difficulty walking or any known incidence of slurred speech.  Spouse at bedside denies any facial drooping. Patient denies asymmetrical weakness.  Social history: He denies etoh, tobacco use, recreational. He is retired and formerly worked as Personal assistant for [employer]  Vaccination history: he has three doses for covid 19, Pfizer  ROS: Constitutional: no weight change, no fever ENT/Mouth: no sore throat, no rhinorrhea Eyes: no eye pain, no vision changes Cardiovascular: no chest pain, + dyspnea,  no edema, no palpitations Respiratory: no cough, no sputum, no wheezing Gastrointestinal: no nausea, no vomiting, no diarrhea, no constipation Genitourinary: no urinary incontinence, no dysuria, no hematuria Musculoskeletal: no arthralgias, no myalgias Skin: no skin lesions, no pruritus, Neuro: + weakness, + loss of consciousness, + syncope Psych: no anxiety, no depression, + decrease appetite Heme/Lymph: no bruising, no bleeding  ED Course: Discussed with ED provider, patient requiring hospitalization for chief concerns of syncope, third episode in 2 weeks.  Vitals in the emergency department was remarkable for temperature of 98.7, respiration rate of 20, heart rate of 60, blood pressure 171/85, SPO2 99% on room air.  Labs in the emergency department was remarkable for sodium 138, potassium 3.3, chloride 102, bicarb 25, BUN 19, serum creatinine on presentation was 1.34, EGFR 56, nonfasting blood glucose 115, WBC 6.4, hemoglobin 17, platelets 163.  TSH was 1.718, T4 1.11, T bili 2.1, D bili 0.3.  High sensitive troponin was 8, second was 7.  EDP gave patient potassium supplements.  Assessment/Plan  Principal Problem:   Syncope  Active Problems:   ED (erectile dysfunction) of organic origin   Hyperlipidemia, unspecified   Apnea, sleep   BPH with obstruction/lower urinary tract symptoms   S/P colostomy takedown   AKI (acute kidney injury) ([place])   Hypokalemia   #  Syncope, recurrent - Query multiple combined antihypertensive medication and BPH with alpha blockade - Stroke cannot be excluded at this time - Patient is status post 48 hours Holter monitor ordered by his cardiologist outpatient which per care everywhere documentation showed frequent PVCs - EKG in the emergency department was read as sinus rhythm with rate of 87, ventricular bigeminy, QTC 416, right bundle branch block - CT of the head was read by radiologist as negative for intracranial abnormality - Check BNP, complete echo ordered - MRI of the brain - Orthostatic blood pressure - I would recommend a.m. team to consider CTA of the head and neck once patient's acute kidney injury improves - Admit to MedSurg, observation, with telemetry for 24 hours - A.m. team to consult neurology pending MRI results  # Symptomatic bradycardia-however 48-hour Holter monitor was remarkable for PVCs Per care everywhere review - Complete echo ordered  # Acute kidney injury-holding home triamterene/hydrochlorothiazide at this time - Etiology work-up in progress at this time - Status post normal saline 500 mL bolus  # Hypertension-on home Norvasc 10 mg, - Holding triamterene and hydrochlorothiazide 37.5-25 daily due to acute kidney injury - Hydralazine 25 mg every 8 hours as needed for SBP greater than 180 - Resumed amlodipine 10 mg for 02/28/2019 2 in the AM  # BPH-on dutasteride 0.5 mg daily resumed  # Mild hypokalemia-suspect secondary to hydrochlorothiazide [place] - Magnesium was within normal limits - Status post potassium chloride 40 mEq p.o., potassium chloride 10 mill equivalent IV x2  # Hyperlipidemia-simvastatin 20 mg nightly # GERD-PPI twice daily resumed # OSA-CPAP nightly ordered  Chart reviewed.   02/17/2021: Outpatient visit with cardiologist for exertional dyspnea, peripheral edema, hypertension, hyperlipidemia and syncope.  Patient had a syncopal episode on 02/14/2021.  Per document,  patient was working at his desk on the computer and he woke up laying on the ground with apparent loss of consciousness.  Subsequently, the patient felt lightheaded, experienced nausea and vomiting.  He saw his PCP, Tyrone Luna, revealing sinus bradycardia with first-degree AV block.  48-hour Holter monitor was placed which was mailed in yesterday, results pending.  Per care everywhere documentation, Dr. Doy Hutching reports that the Holter monitor showed frequent PVCs.  Echo on 06/22/2021: Was read as ejection fraction greater than 55%, normal left ventricular systolic function with moderate LVH, normal right ventricular systolic function, mild valvular regurgitation, no valvular stenosis, trivial aortic regurgitation, trivial mitral regurgitation.  DVT prophylaxis: Enoxaparin 40 mg subcutaneous every 24 hours Code Status: Full code Diet: Heart healthy Family Communication: Ann, his spouse Disposition Plan: Pending clinical course Consults called: None at this time Admission status: Observation, MedSurg, telemetry  Past Medical History:  Diagnosis Date   Arthritis    BPH associated with nocturia    Cancer ([place])    SKIN CA-BASAL AND SQUAMOUS    ED (erectile dysfunction)    Erectile dysfunction    Family history of adverse reaction to anesthesia    SISTER-N/V   Hyperlipemia    Hypertension    Male hypogonadism    Sleep apnea    H/O HAD SURGERY-UUU   Urinary frequency    Urinary urgency    Past Surgical History:  Procedure Laterality Date   ABDOMINAL WALL  DEFECT REPAIR  10/13/2019   Procedure: REPAIR ABDOMINAL WALL (ABDOMINAL WALL RECONSTRUCTION WITH MESH);  Surgeon: Jules Husbands, MD;  Location: [facility] ORS;  Service: General;;   APPENDECTOMY  10/13/2019   Procedure: APPENDECTOMY;  Surgeon: Jules Husbands, MD;  Location: [facility] ORS;  Service: General;;   APPLICATION OF WOUND VAC  10/13/2019   Procedure: APPLICATION OF WOUND VAC;  Surgeon: Jules Husbands, MD;  Location: [facility] ORS;  Service:  General;;  SERIAL NUMBER: [id number]   COLONOSCOPY WITH PROPOFOL N/A 07/17/2019   Procedure: COLONOSCOPY WITH PROPOFOL;  Surgeon: [name], MD;  Location: [facility] ENDOSCOPY;  Service: Gastroenterology;  Laterality: N/A;   COLOSTOMY REVERSAL N/A 10/13/2019   Procedure: COLOSTOMY REVERSAL-colostomy takedown-open;  Surgeon: Jules Husbands, MD;  Location: [facility] ORS;  Service: General;  Laterality: N/A;   EYE SURGERY Bilateral 2002   laser   INGUINAL HERNIA REPAIR Left    KNEE SURGERY Right 1995   LAPAROTOMY N/A 03/15/2019   Procedure: EXPLORATORY LAPAROTOMY,sigmoid colectomy,colostomy;  Surgeon: Jules Husbands, MD;  Location: [facility] ORS;  Service: General;  Laterality: N/A;   UVULOPALATOPHARYNGOPLASTY (UPPP)/TONSILLECTOMY/SEPTOPLASTY     Social History:  reports that he has quit smoking. His smoking use included cigarettes. He has a 1.50 pack-year smoking history. He has never used smokeless tobacco. He reports that he does not drink alcohol and does not use drugs.  No Known Allergies Family History  Problem Relation Age of Onset   Hypertension Other    Prostate cancer Neg Hx    Bladder Cancer Neg Hx    Kidney cancer Neg Hx    Family history: Family history reviewed and not pertinent  Prior to Admission medications   Medication Sig Start Date End Date Taking? Authorizing Provider  amLODipine (NORVASC) 10 MG tablet Take 10 mg by mouth every morning.  03/18/15   [provider]  aspirin 81 MG tablet Take 81 mg by mouth daily.    [provider]  dutasteride (AVODART) 0.5 MG capsule Take 1 capsule (0.5 mg total) by mouth daily. Patient taking differently: Take 0.5 mg by mouth every evening.  03/17/18   McGowan, Larene Beach A, PA-C  EPINEPHrine 0.3 mg/0.3 mL IJ SOAJ injection SMARTSIG:1 Pre-Filled Pen Syringe IM PRN 12/09/19   [provider]  fesoterodine (TOVIAZ) 8 MG TB24 tablet Take by mouth. 11/17/18   [provider]  fluticasone (FLONASE) 50 MCG/ACT nasal spray  Place 2 sprays into both nostrils daily.  08/27/16   [provider]  Glucosamine-Chondroit-Vit C-Mn (GLUCOSAMINE 1500 COMPLEX PO) Take 1 capsule by mouth daily.     [provider]  levocetirizine (XYZAL) 5 MG tablet SMARTSIG:1 Tablet(s) By Mouth Every Evening 12/05/19   [provider]  Multiple Vitamins-Minerals (PRESERVISION AREDS 2 PO) Take 1 capsule by mouth daily.     [provider]  pilocarpine (SALAGEN) 5 MG tablet Take 5 mg by mouth 2 (two) times daily.  03/18/15   [provider]  simvastatin (ZOCOR) 20 MG tablet Take 20 mg by mouth daily at 6 PM.  03/18/15   [provider]  triamterene-hydrochlorothiazide (DYAZIDE) 37.5-25 MG per capsule Take 1 capsule by mouth daily.  03/18/15   [provider]   Physical Exam: Vitals:   02/26/21 0843 02/26/21 0849 02/26/21 1230 02/26/21 1300  BP:  (!) 171/85 (!) 154/77 (!) 163/94  Pulse:  60 62 63  Resp:  '20 16 16  ' Temp:  98.7 F (37.1 C)    TempSrc:  Oral  SpO2:  99% 100% 96%  Weight: 98.5 kg     Height: '6\' 1"'  (1.854 m)      Constitutional: appears age-appropriate, NAD, calm, comfortable Eyes: PERRL, lids and conjunctivae normal ENMT: Mucous membranes are moist. Posterior pharynx clear of any exudate or lesions. Age-appropriate dentition. Hearing appropriate Neck: normal, supple, no masses, no thyromegaly Respiratory: clear to auscultation bilaterally, no wheezing, no crackles. Normal respiratory effort. No accessory muscle use.  Cardiovascular: Regular rate and rhythm, no murmurs / rubs / gallops.  Trace bilateral lower extremity edema. 2+ pedal pulses. No carotid bruits.  Abdomen: Obese abdomen, vertical midline abdominal scar appears chronic and well-healed, no tenderness, no masses palpated, no hepatosplenomegaly. Bowel sounds positive.  Musculoskeletal: no clubbing / cyanosis. No joint deformity upper and lower extremities. Good ROM, no contractures, no atrophy. Normal muscle  tone.  Skin: no rashes, lesions, ulcers. No induration Neurologic: Sensation intact. Strength 5/5 in all 4.  Psychiatric: Normal judgment and insight. Alert and oriented x 3. Normal mood.   EKG: independently reviewed, showing sinus rhythm with rate of 87, ventricular bigeminy, QTC 416, right bundle branch block and LAFB  Chest x-ray on Admission: I personally reviewed and I agree with radiologist reading as below.  CT Head Wo Contrast  Result Date: 02/26/2021 CLINICAL DATA:  Altered mental status.  Syncope. EXAM: CT HEAD WITHOUT CONTRAST TECHNIQUE: Contiguous axial images were obtained from the base of the skull through the vertex without intravenous contrast. COMPARISON:  Dec 29, 2020. FINDINGS: Brain: No evidence of acute infarction, hemorrhage, hydrocephalus, extra-axial collection or mass lesion/mass effect. Vascular: No hyperdense vessel or unexpected calcification. Skull: Normal. Negative for fracture or focal lesion. Sinuses/Orbits: No acute finding. Other: None. IMPRESSION: No acute intracranial abnormality seen. Electronically Signed   By: Marijo Conception M.D.   On: 02/26/2021 13:26    Labs on Admission: I have personally reviewed following labs  CBC: Recent Labs  Lab 02/26/21 0845  WBC 6.4  HGB 17.0  HCT 49.3  MCV 89.6  PLT 629   Basic Metabolic Panel: Recent Labs  Lab 02/26/21 0845  NA 138  K 3.3*  CL 102  CO2 25  GLUCOSE 115*  BUN 19  CREATININE 1.34*  CALCIUM 9.5  MG 2.2   GFR: Estimated Creatinine Clearance: 59.7 mL/min (A) (by C-G formula based on SCr of 1.34 mg/dL (H)).  Liver Function Tests: Recent Labs  Lab 02/26/21 0845  AST 20  ALT 14  ALKPHOS 31*  BILITOT 2.1*  PROT 7.0  ALBUMIN 4.3   Thyroid Function Tests: Recent Labs    02/26/21 0845  TSH 1.718  FREET4 1.11   Anemia Panel: No results for input(s): VITAMINB12, FOLATE, FERRITIN, TIBC, IRON, RETICCTPCT in the last 72 hours.  Urine analysis:    Component Value Date/Time   COLORURINE  YELLOW (A) 03/25/2019 2130   APPEARANCEUR CLEAR (A) 03/25/2019 2130   APPEARANCEUR Clear 03/22/2015 1012   LABSPEC 1.021 03/25/2019 2130   PHURINE 5.0 03/25/2019 2130   GLUCOSEU NEGATIVE 03/25/2019 2130   HGBUR NEGATIVE 03/25/2019 2130   BILIRUBINUR NEGATIVE 03/25/2019 2130   BILIRUBINUR Negative 03/22/2015 [place] 03/25/2019 2130   PROTEINUR NEGATIVE 03/25/2019 2130   NITRITE NEGATIVE 03/25/2019 2130   LEUKOCYTESUR NEGATIVE 03/25/2019 2130   Dr. Tobie Poet [facility]  If 7PM-7AM, please contact overnight-coverage provider If 7AM-7PM, please contact day coverage provider [email or web address]  02/26/2021, 3:34 PM

## 2021-02-26 NOTE — ED Provider Notes (Signed)
[facility] Emergency Department Provider Note  ____________________________________________   Event Date/Time   First MD Initiated Contact with Patient 02/26/21 1230     (approximate)  I have reviewed the triage vital signs and the nursing notes.   HISTORY  Chief Complaint Loss of Consciousness    HPI Tyrone Luna is a 75 y.o. male with arthritis, prior perforated diverticulitis who comes in with concern for syncope.  Patient reports this is his third syncopal episode over the past 2 weeks.  Today's episode happened when he went to go pick something up off the ground he leaned over and then went out.  He woke up and he was on the ground on his side.  Unclear if he hit his head positive LOC.  Denies any chest pain or shortness of breath, leg swelling or other concerns.  These episodes are happening intermittently, nothing makes them better, moderate.  Unclear what is bringing them on.  Patient states that when he wakes up he feels nauseous and sweaty all over.   On review of records patient is being seen by his primary care doctor as well as cardiology for syncopal episodes.  He had a Holter monitor that was mailed in on 02/17/2021.  Results are still pending.  He is planned for carotid ultrasounds on 03/27/2021.        Past Medical History:  Diagnosis Date   Arthritis    BPH associated with nocturia    Cancer ([place])    SKIN CA-BASAL AND SQUAMOUS    ED (erectile dysfunction)    Erectile dysfunction    Family history of adverse reaction to anesthesia    SISTER-N/V   Hyperlipemia    Hypertension    Male hypogonadism    Sleep apnea    H/O HAD SURGERY-UUU   Urinary frequency    Urinary urgency     Patient Active Problem List   Diagnosis Date Noted   S/P colostomy takedown 10/13/2019   Wound infection after surgery    Wound infection 03/28/2019   Diverticulitis large intestine 03/12/2019   Morbid obesity due to excess calories ([place])  05/18/2015   BPH with obstruction/lower urinary tract symptoms 05/05/2015   Nocturia 05/05/2015   Erectile dysfunction of organic origin 05/05/2015   ED (erectile dysfunction) of organic origin 03/22/2015   Allergy to environmental factors 03/22/2015   Hyperlipidemia, unspecified 03/22/2015   BP (high blood pressure) 03/22/2015   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   Apnea, sleep 03/22/2015    Past Surgical History:  Procedure Laterality Date   ABDOMINAL WALL DEFECT REPAIR  10/13/2019   Procedure: REPAIR ABDOMINAL WALL (ABDOMINAL WALL RECONSTRUCTION WITH MESH);  Surgeon: Jules Husbands, MD;  Location: [facility] ORS;  Service: General;;   APPENDECTOMY  10/13/2019   Procedure: APPENDECTOMY;  Surgeon: Jules Husbands, MD;  Location: [facility] ORS;  Service: General;;   APPLICATION OF WOUND VAC  10/13/2019   Procedure: APPLICATION OF WOUND VAC;  Surgeon: Jules Husbands, MD;  Location: [facility] ORS;  Service: General;;  SERIAL NUMBER: [id number]   COLONOSCOPY WITH PROPOFOL N/A 07/17/2019   Procedure: COLONOSCOPY WITH PROPOFOL;  Surgeon: [name], MD;  Location: [facility] ENDOSCOPY;  Service: Gastroenterology;  Laterality: N/A;   COLOSTOMY REVERSAL N/A 10/13/2019   Procedure: COLOSTOMY REVERSAL-colostomy takedown-open;  Surgeon: Jules Husbands, MD;  Location: [facility] ORS;  Service: General;  Laterality: N/A;   EYE SURGERY Bilateral 2002   laser   INGUINAL HERNIA REPAIR Left    KNEE SURGERY Right 1995  LAPAROTOMY N/A 03/15/2019   Procedure: EXPLORATORY LAPAROTOMY,sigmoid colectomy,colostomy;  Surgeon: Jules Husbands, MD;  Location: [facility] ORS;  Service: General;  Laterality: N/A;   UVULOPALATOPHARYNGOPLASTY (UPPP)/TONSILLECTOMY/SEPTOPLASTY      Prior to Admission medications   Medication Sig Start Date End Date Taking? Authorizing Provider  amLODipine (NORVASC) 10 MG tablet Take 10 mg by mouth every morning.  03/18/15   [provider]  aspirin 81 MG tablet Take 81 mg by mouth daily.    [provider]   dutasteride (AVODART) 0.5 MG capsule Take 1 capsule (0.5 mg total) by mouth daily. Patient taking differently: Take 0.5 mg by mouth every evening.  03/17/18   McGowan, Larene [place] A, PA-C  EPINEPHrine 0.3 mg/0.3 mL IJ SOAJ injection SMARTSIG:1 Pre-Filled Pen Syringe IM PRN 12/09/19   [provider]  fesoterodine (TOVIAZ) 8 MG TB24 tablet Take by mouth. 11/17/18   [provider]  fluticasone (FLONASE) 50 MCG/ACT nasal spray Place 2 sprays into both nostrils daily.  08/27/16   [provider]  Glucosamine-Chondroit-Vit C-Mn (GLUCOSAMINE 1500 COMPLEX PO) Take 1 capsule by mouth daily.     [provider]  levocetirizine (XYZAL) 5 MG tablet SMARTSIG:1 Tablet(s) By Mouth Every Evening 12/05/19   [provider]  Multiple Vitamins-Minerals (PRESERVISION AREDS 2 PO) Take 1 capsule by mouth daily.     [provider]  pilocarpine (SALAGEN) 5 MG tablet Take 5 mg by mouth 2 (two) times daily.  03/18/15   [provider]  simvastatin (ZOCOR) 20 MG tablet Take 20 mg by mouth daily at 6 PM.  03/18/15   [provider]  triamterene-hydrochlorothiazide (DYAZIDE) 37.5-25 MG per capsule Take 1 capsule by mouth daily.  03/18/15   [provider]    Allergies Patient has no known allergies.  Family History  Problem Relation Age of Onset   Hypertension Other    Prostate cancer Neg Hx    Bladder Cancer Neg Hx    Kidney cancer Neg Hx     Social History Social History   Tobacco Use   Smoking status: Former    Packs/day: 0.50    Years: 3.00    Pack years: 1.50    Types: Cigarettes   Smokeless tobacco: Never   Tobacco comments:    quit 50 years ago  Vaping Use   Vaping Use: Never used  Substance Use Topics   Alcohol use: No    Alcohol/week: 0.0 standard drinks   Drug use: No      Review of Systems Constitutional: No fever/chills, syncope Eyes: No visual changes. ENT: No sore throat. Cardiovascular: Denies chest  pain. Respiratory: Denies shortness of breath. Gastrointestinal: No abdominal pain.  No nausea, no vomiting.  No diarrhea.  No constipation. Genitourinary: Negative for dysuria. Musculoskeletal: Negative for back pain. Skin: Negative for rash. Neurological: Negative for headaches, focal weakness or numbness. All other ROS negative ____________________________________________   PHYSICAL EXAM:  VITAL SIGNS: ED Triage Vitals  Enc Vitals Group     BP 02/26/21 0849 (!) 171/85     Pulse Rate 02/26/21 0849 60     Resp 02/26/21 0849 20     Temp 02/26/21 0849 98.7 F (37.1 C)     Temp Source 02/26/21 0849 Oral     SpO2 02/26/21 0849 99 %     Weight 02/26/21 0843 217 lb 2.5 oz (98.5 kg)     Height 02/26/21 0843 6\' 1"  (1.854 m)     Head Circumference --  Peak Flow --      Pain Score 02/26/21 0843 0     Pain Loc --      Pain Edu? --      Excl. in [place]? --     Constitutional: Alert and oriented. Well appearing and in no acute distress. Eyes: Conjunctivae are normal. EOMI. Head: Atraumatic. Nose: No congestion/rhinnorhea. Mouth/Throat: Mucous membranes are moist.   Neck: No stridor. Trachea Midline. FROM.  No cervical spine tenderness. Cardiovascular: Normal rate, regular rhythm. Grossly normal heart sounds.  Good peripheral circulation. Respiratory: Normal respiratory effort.  No retractions. Lungs CTAB. Gastrointestinal: Soft and nontender. No distention. No abdominal bruits.  Musculoskeletal: No lower extremity tenderness nor edema.  No joint effusions. Neurologic:  Normal speech and language. No gross focal neurologic deficits are appreciated.  Equal strength in arms and legs.  Cranial nerves are intact Skin:  Skin is warm, dry and intact. No rash noted. Psychiatric: Mood and affect are normal. Speech and behavior are normal. GU: Deferred   ____________________________________________   LABS (all labs ordered are listed, but only abnormal results are displayed)  Labs  Reviewed  BASIC METABOLIC PANEL - Abnormal; Notable for the following components:      Result Value   Potassium 3.3 (*)    Glucose, Bld 115 (*)    Creatinine, Ser 1.34 (*)    GFR, Estimated 56 (*)    All other components within normal limits  CBC  URINALYSIS, COMPLETE (UACMP) WITH MICROSCOPIC   ____________________________________________   ED ECG REPORT I, Tyrone Luna, the attending physician, personally viewed and interpreted this ECG.  Sinus rate of 67, no ST elevation, T wave version in aVL, V2 with occasional PVC and right bundle branch block  Repeat EKG shows bigeminy rate of 87, no ST elevation, no T wave versions, normal intervals ____________________________________________  RADIOLOGY   Official radiology report(s): CT Head Wo Contrast  Result Date: 02/26/2021 CLINICAL DATA:  Altered mental status.  Syncope. EXAM: CT HEAD WITHOUT CONTRAST TECHNIQUE: Contiguous axial images were obtained from the base of the skull through the vertex without intravenous contrast. COMPARISON:  Dec 29, 2020. FINDINGS: Brain: No evidence of acute infarction, hemorrhage, hydrocephalus, extra-axial collection or mass lesion/mass effect. Vascular: No hyperdense vessel or unexpected calcification. Skull: Normal. Negative for fracture or focal lesion. Sinuses/Orbits: No acute finding. Other: None. IMPRESSION: No acute intracranial abnormality seen. Electronically Signed   By: Marijo Conception M.D.   On: 02/26/2021 13:26    ____________________________________________   PROCEDURES  Procedure(s) performed (including Critical Care):  .1-3 Lead EKG Interpretation  Date/Time: 02/26/2021 1:01 PM Performed by: Tyrone [place], MD Authorized by: Tyrone Adams, MD     Interpretation: abnormal     ECG rate:  60s   ECG rate assessment: normal     Rhythm: sinus rhythm     Ectopy: bigeminy     Conduction: normal   Comments:     Initially.  Normal sinus with occasional PVC but then occasionally goes into  bigeminy    ____________________________________________   INITIAL IMPRESSION / ASSESSMENT AND PLAN / ED COURSE  ZERICK PREVETTE was evaluated in Emergency Department on 02/26/2021 for the symptoms described in the history of present illness. He was evaluated in the context of the global COVID-19 pandemic, which necessitated consideration that the patient might be at risk for infection with the SARS-CoV-2 virus that causes COVID-19. Institutional protocols and algorithms that pertain to the evaluation of patients at risk for COVID-19 are  in a state of rapid change based on information released by regulatory bodies including the CDC and federal and state organizations. These policies and algorithms were followed during the patient's care in the ED.    Patient is a 75 year old who comes in with syncopal episode.  Labs ordered to evaluate for Electra MIs, AKI, thyroid dysfunction.  Patient will be kept on the cardiac monitor.  Patient initially just sinus with occasional PVCs but then goes into bigeminy.  Given patient might of hit his head will get CT head to make sure no evidence of intercranial hemorrhage.  No cervical spine tenderness to suggest cervical fracture.  No numbness or tingling in his hands and full range of motion of neck.  D/w Dr. Ubaldo Glassing prior Holter monitor showed normal sinus with frequent PVCs  Discussed with Dr. Ubaldo Glassing given the bigeminy.  To consider starting a low-dose metoprolol 25 mg and cutting his amlodipine in half.  Potassium is low at 3.3 so patient given some IV and oral repletion.  His blood pressures also did drop slighty when he stood up so we will give some fluids  Reevaluated patient patient remains in bigeminy.  Given patient's last echo was 2 years ago I discussed with patient different options including admission for syncope work-up versus going home.  Patient felt more comfortable coming into the  hospital.    ____________________________________________   FINAL CLINICAL IMPRESSION(S) / ED DIAGNOSES   Final diagnoses:  Syncope and collapse  Bigeminy  Hypokalemia      MEDICATIONS GIVEN DURING THIS VISIT:  Medications  potassium chloride 10 mEq in 100 mL IVPB (10 mEq Intravenous New Bag/Given 02/26/21 1504)  potassium chloride SA (KLOR-CON) CR tablet 40 mEq (40 mEq Oral Given 02/26/21 1328)  sodium chloride 0.9 % bolus 500 mL (500 mLs Intravenous New Bag/Given 02/26/21 1332)     ED Discharge Orders     None        Note:  This document was prepared using Dragon voice recognition software and may include unintentional dictation errors.    Tyrone [place], MD 02/26/21 [phone]

## 2021-02-27 ENCOUNTER — Encounter: Payer: Self-pay | Admitting: Internal Medicine

## 2021-02-27 ENCOUNTER — Observation Stay
Admit: 2021-02-27 | Discharge: 2021-02-27 | Disposition: A | Discharge status: Home / Self Care | Payer: Medicare HMO | Attending: Internal Medicine | Admitting: Internal Medicine

## 2021-02-27 ENCOUNTER — Observation Stay: Payer: Medicare HMO

## 2021-02-27 DIAGNOSIS — R55 Syncope and collapse: Secondary | ICD-10-CM | POA: Diagnosis not present

## 2021-02-27 DIAGNOSIS — I5189 Other ill-defined heart diseases: Secondary | ICD-10-CM

## 2021-02-27 HISTORY — DX: Other ill-defined heart diseases: I51.89

## 2021-02-27 LAB — CBC
HCT: 46.4 % (ref 39.0–52.0)
Hemoglobin: 16 g/dL (ref 13.0–17.0)
MCH: 30.7 pg (ref 26.0–34.0)
MCHC: 34.5 g/dL (ref 30.0–36.0)
MCV: 88.9 fL (ref 80.0–100.0)
Platelets: 159 10*3/uL (ref 150–400)
RBC: 5.22 MIL/uL (ref 4.22–5.81)
RDW: 12.8 % (ref 11.5–15.5)
WBC: 6.6 10*3/uL (ref 4.0–10.5)
nRBC: 0 % (ref 0.0–0.2)

## 2021-02-27 LAB — COMPREHENSIVE METABOLIC PANEL
ALT: 11 U/L (ref 0–44)
AST: 17 U/L (ref 15–41)
Albumin: 3.8 g/dL (ref 3.5–5.0)
Alkaline Phosphatase: 32 U/L — ABNORMAL LOW (ref 38–126)
Anion gap: 9 (ref 5–15)
BUN: 20 mg/dL (ref 8–23)
CO2: 24 mmol/L (ref 22–32)
Calcium: 9 mg/dL (ref 8.9–10.3)
Chloride: 105 mmol/L (ref 98–111)
Creatinine, Ser: 0.96 mg/dL (ref 0.61–1.24)
GFR, Estimated: >60 mL/min (ref >60)
Glucose, Bld: 99 mg/dL (ref 70–99)
Potassium: 3.6 mmol/L (ref 3.5–5.1)
Sodium: 138 mmol/L (ref 135–145)
Total Bilirubin: 2.1 mg/dL — ABNORMAL HIGH (ref 0.3–1.2)
Total Protein: 6.1 g/dL — ABNORMAL LOW (ref 6.5–8.1)

## 2021-02-27 LAB — GLUCOSE, CAPILLARY: Glucose-Capillary: 109 mg/dL — ABNORMAL HIGH (ref 70–99)

## 2021-02-27 LAB — SARS CORONAVIRUS 2 (TAT 6-24 HRS): SARS Coronavirus 2: NEGATIVE

## 2021-02-27 IMAGING — CT CT ANGIO HEAD-NECK (W OR W/O PERF)
1 of 10 series · 6 of 33 positions shown · IV contrast (APPLIED)
Comparison: [DATE] CT head

CLINICAL DATA: Recurrent syncope

EXAM:
CT ANGIOGRAPHY HEAD AND NECK
TECHNIQUE: Multidetector CT imaging of the head and neck was performed using
the standard protocol during bolus administration of intravenous
contrast. Multiplanar CT image reconstructions and MIPs were
obtained to evaluate the vascular anatomy. Carotid stenosis
measurements (when applicable) are obtained utilizing NASCET
criteria, using the distal internal carotid diameter as the
denominator.
CONTRAST:  75mL OMNIPAQUE IOHEXOL 350 MG/ML SOLN

[Series 9: ax thin · axial · 0.39mm/px · z∈[-274,-15]mm · 6 of 363 slices shown]
[im 52/363  soft-tissue]
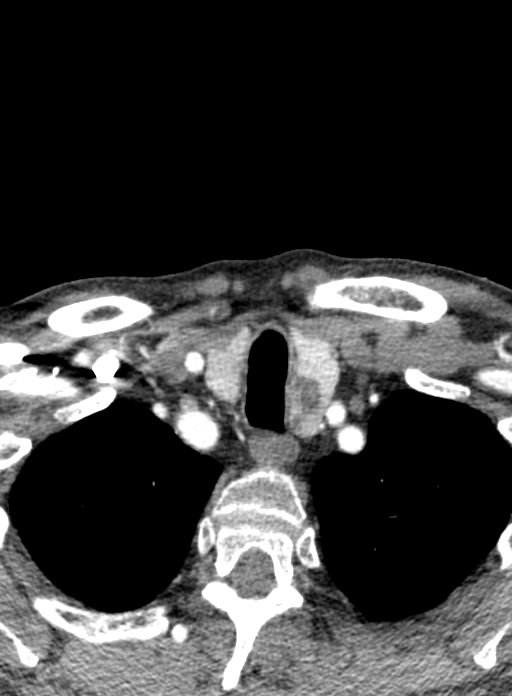
[im 104/363  bone]
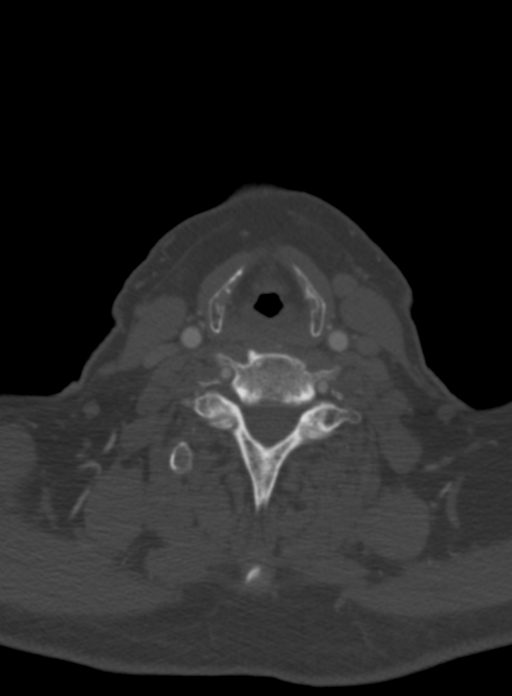
[im 156/363  soft-tissue]
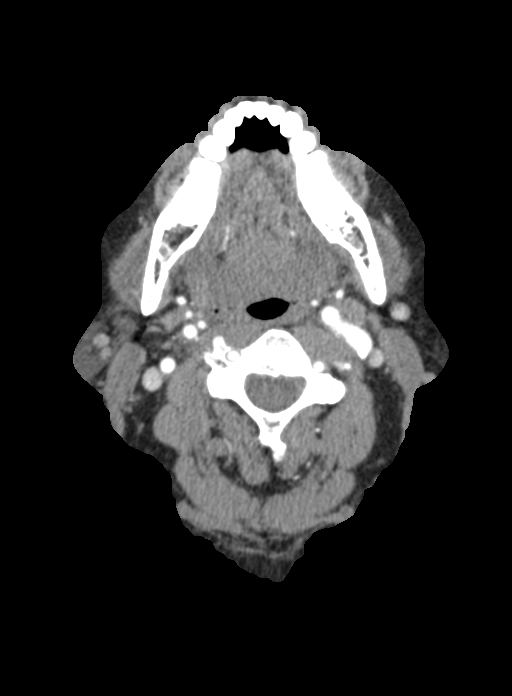
[im 207/363  bone]
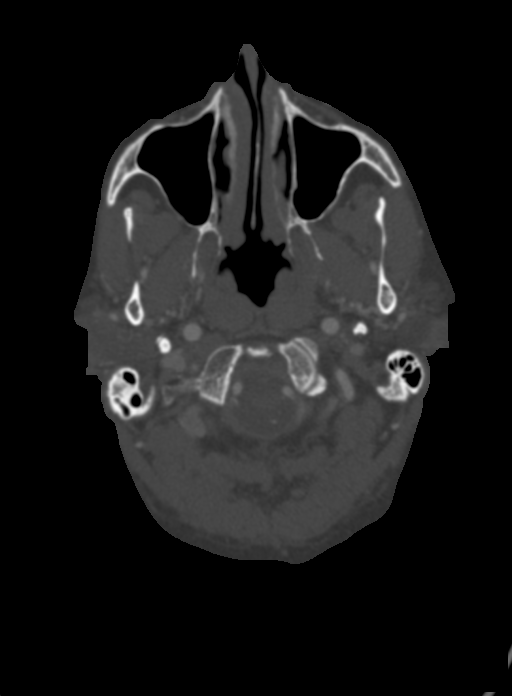
[im 259/363  soft-tissue]
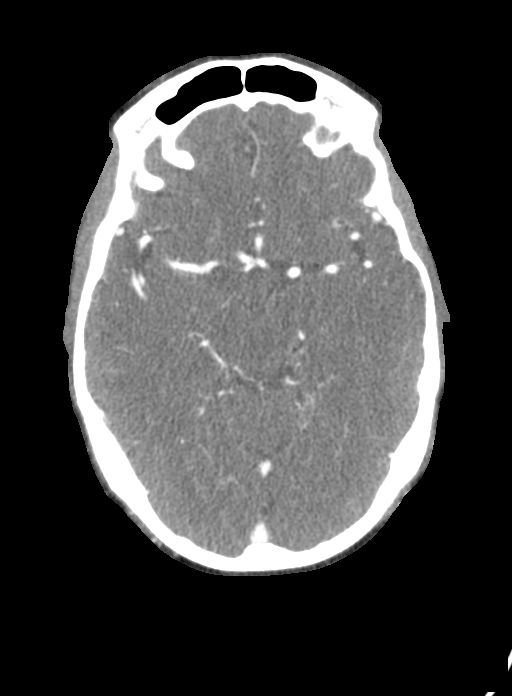
[im 311/363  bone]
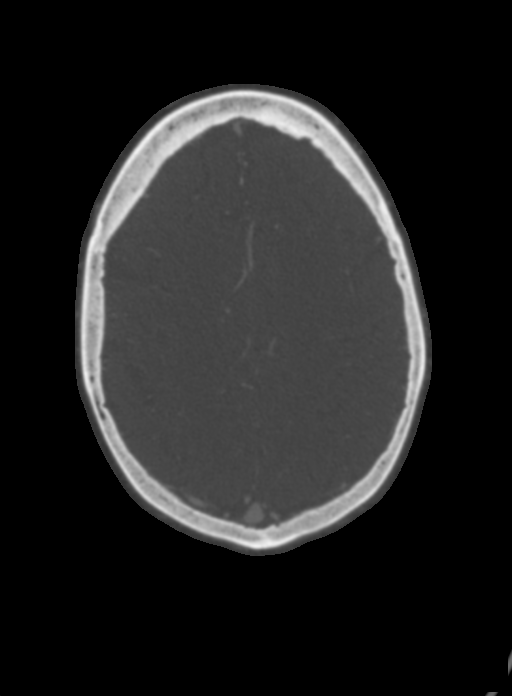

[6 of 33 positions shown; findings below may reference images not displayed]

FINDINGS: CT HEAD

Brain: There is no acute intracranial hemorrhage, mass effect, or
edema. Gray-white differentiation is preserved. There is no
extra-axial fluid collection. Ventricles and sulci are stable in
size and configuration. Patchy low-attenuation in the supratentorial
white matter probably reflects stable chronic microvascular ischemic
changes. Small chronic right cerebellar infarct.

Vascular: No new findings.

Skull: Calvarium is unremarkable.

Sinuses/Orbits: No acute finding.

Other: None.

Review of the MIP images confirms the above findings

CTA NECK

Aortic arch: Minimal plaque along the arch and great vessel origins.

Right carotid system: Patent. Calcified plaque along the proximal
internal carotid with less than 50% stenosis.

Left carotid system: Patent. Calcified plaque along the proximal
internal carotid without stenosis.

Vertebral arteries: Patent and codominant. Calcified plaque at the
right vertebral origin. No significant stenosis or evidence of
dissection.

Skeleton: Degenerative changes of the cervical spine.

Other neck: Thyroid nodules measuring up to 1.2 cm. No ultrasound
follow-up is recommended by current guidelines.

Upper chest: Included upper lungs are clear.

Review of the MIP images confirms the above findings

CTA HEAD

Anterior circulation: Intracranial internal carotid arteries are
patent with mild calcified plaque but no significant stenosis.
Anterior and middle cerebral arteries are patent.

Posterior circulation: Intracranial vertebral arteries are patent
with minimal calcified plaque. Basilar artery is patent. Major
cerebellar artery origins are patent. Right posterior communicating
artery is present. Posterior cerebral arteries are patent. Fetal
origin of the right PCA.

Venous sinuses: Patent as allowed by contrast bolus timing.

Review of the MIP images confirms the above findings
IMPRESSION: No acute intracranial abnormality or significant change since recent
prior study.

No large vessel occlusion, hemodynamically significant stenosis, or
evidence of dissection.

## 2021-02-27 MED ORDER — IOHEXOL 350 MG/ML SOLN
75.0000 mL | Freq: Once | INTRAVENOUS | Status: AC | PRN
  Administered 2021-02-27: 75 mL via INTRAVENOUS

## 2021-02-27 NOTE — Progress Notes (Signed)
PROGRESS NOTE  Tyrone Luna  DOB: Jan 09, 1946  PCP: [name], MD ELF:[id number]  DOA: 02/26/2021  LOS: 0 days  Hospital Day: 2   Chief Complaint  Patient presents with   Loss of Consciousness    Brief narrative: Tyrone Luna is a 75 y.o. male with PMH significant for HTN, HLD, OSA on CPAP, BPH, h/o perforated diverticulitis status post Hartmann procedure, subsequent Hartmann takedown. Patient was brought to the ED on 7/3 after 3 episodes of syncope in last 2 weeks. 02/14/2021, patient syncopized while sitting at his desk working on the computer, hitting his head sustaining a skin tear on the right posterior parietal area of his scalp. 02/19/2021, patient syncopized while sitting on a chair while watching TV, woke up on his own. 02/26/2021, patient felt lightheaded and dizzy, passed out and woke up on his left side on the floor. All 3 syncopal episodes were unwitnessed, without urinary or bowel incontinence. No recent change in medications.  No other focal neurological deficits.  In the interval, patient was seen by his PCP at [facility] clinic, found to have sinus bradycardia with first-degree AV block 6/24, patient was evaluated by cardiologist.  48-hour Holter monitor was placed which resulted showing frequent PVCs.   In the ED, patient was afebrile, was in sinus bradycardia in 50s, blood pressure was initially elevated to 171/85. EKG showed normal sinus rhythm at the rate of 87, ventricular bigeminy, QTC 416 milliseconds, right bundle branch block CT head did not show any intracranial abnormality MRI of the brain did not show any acute infarction, hemorrhage mass, fluid collection or edema.  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  Admitted to hospitalist service for further evaluation and management  Subjective: Patient was seen and examined this morning.   Pleasant elderly Caucasian male.  Lying on bed.  Not in distress.  No new symptoms.  Wife at bedside.  Assessment/Plan: Recurrent syncope -Unclear cause -2 of the episodes were without any premonition symptoms while the last 1 on the day of admission was preceded by lightheadedness and dizziness. -EKG in the ED shows normal sinus rhythm at 87 bpm but patient has remained mostly bradycardic overnight with a rate as low as 30s last night. -Monitor orthostatic vitals -Pending echocardiogram. -Also obtain CTA head and neck. -Telemetry monitoring showed multiple PVCs and frequent ventricular bigeminy  Sinus bradycardia -Heart rate mostly in 50s overnight, as low as 30s.   -Holter monitor showed PVCs  -Pending echocardiogram  AKI -Improving with IV fluid.  HCTZ/triamterene on hold. Recent Labs    02/26/21 0845 02/27/21 0518  BUN 19 20  CREATININE 1.34* 0.96   Hypokalemia -Improved with replacement. Recent Labs  Lab 02/26/21 0845 02/27/21 0518  K 3.3* 3.6  MG 2.2  --    Essential hypertension  -Home meds include Norvasc 10 mg daily, HCTZ/triamterene 37.5 mg / 25 mg.   -Currently on amlodipine.  Diuretics on hold. -Current and monitor blood pressure.  Hyperlipidemia -Simvastatin 20 mg nightly.  BPH -Continue dutasteride  GERD -PPI twice daily   OSA -Reports compliance with nightly CPAP.    Mobility: Encourage ambulation Code Status:   Code Status: Full Code  Nutritional status: Body mass index is 28.85 kg/m.     Diet:  Diet Order             Diet Heart Room service appropriate? Yes; Fluid consistency: Thin  Diet effective  now                  DVT prophylaxis:  enoxaparin (LOVENOX) injection 40 mg Start: 02/26/21 2345 SCDs Start: 02/26/21 1525   Antimicrobials: None Fluid: None Consultants: None Family Communication: Wife at bedside  Status is: Inpatient  Remains inpatient appropriate because: Needs further cardiac evaluation  Dispo: The  patient is from: Home              Anticipated d/c is to: Home in 2 to 3 days              Patient currently is not medically stable to d/c.   Difficult to place patient No     Infusions:    Scheduled Meds:  amLODipine  10 mg Oral Daily   aspirin  81 mg Oral Daily   dutasteride  0.5 mg Oral QPM   enoxaparin (LOVENOX) injection  40 mg Subcutaneous Q24H   pantoprazole  40 mg Oral BID   simvastatin  20 mg Oral QPM   sodium chloride flush  3 mL Intravenous Q12H    Antimicrobials: Anti-infectives (From admission, onward)    None       PRN meds: acetaminophen **OR** acetaminophen, hydrALAZINE, ondansetron **OR** ondansetron (ZOFRAN) IV   Objective: Vitals:   02/27/21 0443 02/27/21 0822  BP: 137/77 (!) 146/84  Pulse: (!) 51 (!) 52  Resp: 16 16  Temp: 98 F (36.7 C) 97.9 F (36.6 C)  SpO2: 99% 98%    Intake/Output Summary (Last 24 hours) at 02/27/2021 1155 Last data filed at 02/27/2021 0300 Gross per 24 hour  Intake 1193.95 ml  Output 1700 ml  Net -506.05 ml   Filed Weights   02/26/21 0843 02/27/21 0011 02/27/21 0500  Weight: 98.5 kg 99.2 kg 99.2 kg   Weight change:  Body mass index is 28.85 kg/m.   Physical Exam: General exam: Pleasant, elderly Caucasian male.  Not in distress Skin: No rashes, lesions or ulcers. HEENT: Atraumatic, normocephalic, no obvious bleeding Lungs: Clear to auscultation bilaterally CVS: Regular rate and rhythm, no murmur GI/Abd soft, nontender, nondistended, bowel sound present CNS: Alert, awake, oriented x3 Psychiatry: Mood appropriate Extremities: No pedal edema, no calf tenderness  Data Review: I have personally reviewed the laboratory data and studies available.  Recent Labs  Lab 02/26/21 0845 02/27/21 0518  WBC 6.4 6.6  HGB 17.0 16.0  HCT 49.3 46.4  MCV 89.6 88.9  PLT 163 159   Recent Labs  Lab 02/26/21 0845 02/27/21 0518  NA 138 138  K 3.3* 3.6  CL 102 105  CO2 25 24  GLUCOSE 115* 99  BUN 19 20  CREATININE  1.34* 0.96  CALCIUM 9.5 9.0  MG 2.2  --     F/u labs ordered Unresulted Labs (From admission, onward)     Start     Ordered   02/28/21 0500  CBC with Differential/Platelet  Daily,   R      02/27/21 1155   02/28/21 1916  Basic metabolic panel  Daily,   R      02/27/21 1155   02/28/21 0500  Magnesium  Tomorrow morning,   R        02/27/21 1155   02/28/21 0500  Phosphorus  Tomorrow morning,   R        02/27/21 1155   02/26/21 2020  SARS CORONAVIRUS 2 (TAT 6-24 HRS) Nasopharyngeal Nasopharyngeal Swab  (Tier 3 - Symptomatic/asymptomatic)  Once,   STAT  Question Answer Comment  Is this test for diagnosis or screening Screening   Symptomatic for COVID-19 as defined by CDC No   Hospitalized for COVID-19 No   Admitted to ICU for COVID-19 No   Previously tested for COVID-19 Yes   Resident in a congregate (group) care setting No   Employed in healthcare setting No   Has patient completed COVID vaccination(s) (2 doses of Pfizer/Moderna 1 dose of [employer]) No      02/26/21 2020            Signed, Terrilee Croak, MD [facility] 02/27/2021

## 2021-02-27 NOTE — Progress Notes (Signed)
Pt refused CPAP and stated he does not wear at home.

## 2021-02-28 DIAGNOSIS — R55 Syncope and collapse: Secondary | ICD-10-CM | POA: Diagnosis not present

## 2021-02-28 DIAGNOSIS — I4729 Other ventricular tachycardia: Secondary | ICD-10-CM

## 2021-02-28 HISTORY — DX: Other ventricular tachycardia: I47.29

## 2021-02-28 LAB — CBC WITH DIFFERENTIAL/PLATELET
Abs Immature Granulocytes: 0.03 10*3/uL (ref 0.00–0.07)
Basophils Absolute: 0.1 10*3/uL (ref 0.0–0.1)
Basophils Relative: 1 %
Eosinophils Absolute: 0.4 10*3/uL (ref 0.0–0.5)
Eosinophils Relative: 4 %
HCT: 46.7 % (ref 39.0–52.0)
Hemoglobin: 15.9 g/dL (ref 13.0–17.0)
Immature Granulocytes: 0 %
Lymphocytes Relative: 17 %
Lymphs Abs: 1.4 10*3/uL (ref 0.7–4.0)
MCH: 30.8 pg (ref 26.0–34.0)
MCHC: 34 g/dL (ref 30.0–36.0)
MCV: 90.3 fL (ref 80.0–100.0)
Monocytes Absolute: 0.8 10*3/uL (ref 0.1–1.0)
Monocytes Relative: 10 %
Neutro Abs: 5.7 10*3/uL (ref 1.7–7.7)
Neutrophils Relative %: 68 %
Platelets: 160 10*3/uL (ref 150–400)
RBC: 5.17 MIL/uL (ref 4.22–5.81)
RDW: 12.8 % (ref 11.5–15.5)
WBC: 8.4 10*3/uL (ref 4.0–10.5)
nRBC: 0 % (ref 0.0–0.2)

## 2021-02-28 LAB — GLUCOSE, CAPILLARY: Glucose-Capillary: 89 mg/dL (ref 70–99)

## 2021-02-28 LAB — BASIC METABOLIC PANEL
Anion gap: 7 (ref 5–15)
BUN: 23 mg/dL (ref 8–23)
CO2: 26 mmol/L (ref 22–32)
Calcium: 9 mg/dL (ref 8.9–10.3)
Chloride: 106 mmol/L (ref 98–111)
Creatinine, Ser: 0.94 mg/dL (ref 0.61–1.24)
GFR, Estimated: >60 mL/min (ref >60)
Glucose, Bld: 88 mg/dL (ref 70–99)
Potassium: 3.5 mmol/L (ref 3.5–5.1)
Sodium: 139 mmol/L (ref 135–145)

## 2021-02-28 LAB — ECHOCARDIOGRAM COMPLETE
AR max vel: 2.59 cm2
AV Area VTI: 2.9 cm2
AV Area mean vel: 2.68 cm2
AV Mean grad: 7 mmHg
AV Peak grad: 14.1 mmHg
Ao pk vel: 1.88 m/s
Area-P 1/2: 2.52 cm2
Height: 73 in
S' Lateral: 3 cm
Weight: 3499.14 oz

## 2021-02-28 LAB — PHOSPHORUS: Phosphorus: 3.4 mg/dL (ref 2.5–4.6)

## 2021-02-28 LAB — MAGNESIUM: Magnesium: 2.3 mg/dL (ref 1.7–2.4)

## 2021-02-28 MED ORDER — DUTASTERIDE 0.5 MG PO CAPS: 0.5000 mg | ORAL_CAPSULE | Freq: Every evening | ORAL | Status: AC

## 2021-02-28 NOTE — Consult Note (Addendum)
[facility] Cardiology  CARDIOLOGY CONSULT NOTE  Patient ID: Tyrone Luna MRN: [id number] DOB/AGE: 05/15/1946 75 y.o.  Admit date: 02/26/2021 Referring Physician Dahul Primary Physician Sparks Primary Cardiologist Quindon Denker Reason for Consultation syncope  HPI: 75 year old gentleman referred for evaluation of syncope.  On 02/14/2021, the patient experienced an unwitnessed episode of syncope in the morning while working on his computer.  The patient had a brief episode of lightheadedness prior to loss of consciousness.  The patient was seen by his primary care provider and referred for evaluation of bradycardia.  ECG revealed sinus rhythm with frequent PVCs without evidence for bradycardia.  On 02/19/2021, the patient had a second unwitnessed episode of syncope while sitting in a chair watching TV.  On 02/26/2021, the patient was shredding documents, and had a third unwitnessed syncopal episode ,and was brought to [facility] ED.  Initial ECG revealed sinus rhythm at 67 bpm with right bundle branch block and unifocal PVCs.  Follow-up ECG revealed sinus rhythm at 87 bpm with ventricular bigeminy.  Telemetry revealed predominant sinus rhythm with frequent PVCs.  High-sensitivity troponin was normal at 8 and 7.  Head CT, brain MRI, and CT angiogram were all unremarkable.  2D echocardiogram was performed 02/27/2021 which revealed normal left ventricular function, with LVEF 60 to 65%.  The patient denies chest pain, shortness of breath, palpitations, or heart racing.  Review of systems complete and found to be negative unless listed above     Past Medical History:  Diagnosis Date   Arthritis    BPH associated with nocturia    Cancer ([place])    SKIN CA-BASAL AND SQUAMOUS    ED (erectile dysfunction)    Erectile dysfunction    Family history of adverse reaction to anesthesia    SISTER-N/V   Hyperlipemia    Hypertension    Male hypogonadism    Sleep apnea    H/O HAD SURGERY-UUU   Urinary frequency    Urinary  urgency     Past Surgical History:  Procedure Laterality Date   ABDOMINAL WALL DEFECT REPAIR  10/13/2019   Procedure: REPAIR ABDOMINAL WALL (ABDOMINAL WALL RECONSTRUCTION WITH MESH);  Surgeon: Jules Husbands, MD;  Location: [facility] ORS;  Service: General;;   APPENDECTOMY  10/13/2019   Procedure: APPENDECTOMY;  Surgeon: Jules Husbands, MD;  Location: [facility] ORS;  Service: General;;   APPLICATION OF WOUND VAC  10/13/2019   Procedure: APPLICATION OF WOUND VAC;  Surgeon: Jules Husbands, MD;  Location: [facility] ORS;  Service: General;;  SERIAL NUMBER: [id number]   COLONOSCOPY WITH PROPOFOL N/A 07/17/2019   Procedure: COLONOSCOPY WITH PROPOFOL;  Surgeon: [name], MD;  Location: [facility] ENDOSCOPY;  Service: Gastroenterology;  Laterality: N/A;   COLOSTOMY REVERSAL N/A 10/13/2019   Procedure: COLOSTOMY REVERSAL-colostomy takedown-open;  Surgeon: Jules Husbands, MD;  Location: [facility] ORS;  Service: General;  Laterality: N/A;   EYE SURGERY Bilateral 2002   laser   INGUINAL HERNIA REPAIR Left    KNEE SURGERY Right 1995   LAPAROTOMY N/A 03/15/2019   Procedure: EXPLORATORY LAPAROTOMY,sigmoid colectomy,colostomy;  Surgeon: Jules Husbands, MD;  Location: [facility] ORS;  Service: General;  Laterality: N/A;   UVULOPALATOPHARYNGOPLASTY (UPPP)/TONSILLECTOMY/SEPTOPLASTY      Medications Prior to Admission  Medication Sig Dispense Refill Last Dose   amLODipine (NORVASC) 10 MG tablet Take 10 mg by mouth daily.  2 02/26/2021 at 0800   aspirin 81 MG tablet Take 81 mg by mouth daily.      dutasteride (AVODART) 0.5 MG capsule Take 1  capsule (0.5 mg total) by mouth daily. (Patient taking differently: Take 0.5 mg by mouth every evening.) 90 capsule 3 02/25/2021 at 1800   Glucosamine-Chondroit-Vit C-Mn (GLUCOSAMINE 1500 COMPLEX PO) Take 1 capsule by mouth daily.       ipratropium (ATROVENT) 0.06 % nasal spray Place 2 sprays into both nostrils 3 (three) times daily as needed for congestion or allergies.   Unknown at PRN   loratadine  (CLARITIN) 10 MG tablet Take 10 mg by mouth daily.      Multiple Vitamins-Minerals (PRESERVISION AREDS 2 PO) Take 1 capsule by mouth 2 (two) times daily.      pantoprazole (PROTONIX) 40 MG tablet Take 40 mg by mouth 2 (two) times daily.   02/26/2021 at 0800   pilocarpine (SALAGEN) 5 MG tablet Take 5 mg by mouth 2 (two) times daily.   2 02/26/2021 at 0800   simvastatin (ZOCOR) 20 MG tablet Take 20 mg by mouth every evening.  2 02/25/2021 at 1800   triamterene-hydrochlorothiazide (DYAZIDE) 37.5-25 MG per capsule Take 1 capsule by mouth daily.   2 02/26/2021 at 0800   Social History   Socioeconomic History   Marital status: Married    Spouse name: Not on file   Number of children: Not on file   Years of education: Not on file   Highest education level: Not on file  Occupational History   Not on file  Tobacco Use   Smoking status: Former    Packs/day: 0.50    Years: 3.00    Pack years: 1.50    Types: Cigarettes   Smokeless tobacco: Never   Tobacco comments:    quit 50 years ago  Vaping Use   Vaping Use: Never used  Substance and Sexual Activity   Alcohol use: No    Alcohol/week: 0.0 standard drinks   Drug use: No   Sexual activity: Yes  Other Topics Concern   Not on file  Social History Narrative   Not on file   Social Determinants of Health   Financial Resource Strain: Not on file  Food Insecurity: Not on file  Transportation Needs: Not on file  Physical Activity: Not on file  Stress: Not on file  Social Connections: Not on file  Intimate Partner Violence: Not on file    Family History  Problem Relation Age of Onset   Hypertension Other    Prostate cancer Neg Hx    Bladder Cancer Neg Hx    Kidney cancer Neg Hx       Review of systems complete and found to be negative unless listed above      PHYSICAL EXAM  General: Well developed, well nourished, in no acute distress HEENT:  Normocephalic and atramatic Neck:  No JVD.  Lungs: Clear bilaterally to auscultation  and percussion. Heart: HRRR . Normal S1 and S2 without gallops or murmurs.  Abdomen: Bowel sounds are positive, abdomen soft and non-tender  Msk:  Back normal, normal gait. Normal strength and tone for age. Extremities: No clubbing, cyanosis or edema.   Neuro: Alert and oriented X 3. Psych:  Good affect, responds appropriately  Labs:   Lab Results  Component Value Date   WBC 8.4 02/28/2021   HGB 15.9 02/28/2021   HCT 46.7 02/28/2021   MCV 90.3 02/28/2021   PLT 160 02/28/2021    Recent Labs  Lab 02/27/21 0518 02/28/21 0507  NA 138 139  K 3.6 3.5  CL 105 106  CO2 24 26  BUN 20 23  CREATININE 0.96 0.94  CALCIUM 9.0 9.0  PROT 6.1*  --   BILITOT 2.1*  --   ALKPHOS 32*  --   ALT 11  --   AST 17  --   GLUCOSE 99 88   No results found for: CKTOTAL, CKMB, CKMBINDEX, TROPONINI No results found for: CHOL No results found for: HDL No results found for: [facility] Lab Results  Component Value Date   TRIG 218 (H) 03/17/2019   No results found for: CHOLHDL No results found for: LDLDIRECT    Radiology: CT ANGIO HEAD NECK W WO CM  Result Date: 02/27/2021 CLINICAL DATA:  Recurrent syncope EXAM: CT ANGIOGRAPHY HEAD AND NECK TECHNIQUE: Multidetector CT imaging of the head and neck was performed using the standard protocol during bolus administration of intravenous contrast. Multiplanar CT image reconstructions and MIPs were obtained to evaluate the vascular anatomy. Carotid stenosis measurements (when applicable) are obtained utilizing NASCET criteria, using the distal internal carotid diameter as the denominator. CONTRAST:  69mL OMNIPAQUE IOHEXOL 350 MG/ML SOLN COMPARISON:  02/26/2021 CT head FINDINGS: CT HEAD Brain: There is no acute intracranial hemorrhage, mass effect, or edema. Gray-white differentiation is preserved. There is no extra-axial fluid collection. Ventricles and sulci are stable in size and configuration. Patchy low-attenuation in the supratentorial white matter probably  reflects stable chronic microvascular ischemic changes. Small chronic right cerebellar infarct. Vascular: No new findings. Skull: Calvarium is unremarkable. Sinuses/Orbits: No acute finding. Other: None. Review of the MIP images confirms the above findings CTA NECK Aortic arch: Minimal plaque along the arch and great vessel origins. Right carotid system: Patent. Calcified plaque along the proximal internal carotid with less than 50% stenosis. Left carotid system: Patent. Calcified plaque along the proximal internal carotid without stenosis. Vertebral arteries: Patent and codominant. Calcified plaque at the right vertebral origin. No significant stenosis or evidence of dissection. Skeleton: Degenerative changes of the cervical spine. Other neck: Thyroid nodules measuring up to 1.2 cm. No ultrasound follow-up is recommended by current guidelines. Upper chest: Included upper lungs are clear. Review of the MIP images confirms the above findings CTA HEAD Anterior circulation: Intracranial internal carotid arteries are patent with mild calcified plaque but no significant stenosis. Anterior and middle cerebral arteries are patent. Posterior circulation: Intracranial vertebral arteries are patent with minimal calcified plaque. Basilar artery is patent. Major cerebellar artery origins are patent. Right posterior communicating artery is present. Posterior cerebral arteries are patent. Fetal origin of the right PCA. Venous sinuses: Patent as allowed by contrast bolus timing. Review of the MIP images confirms the above findings IMPRESSION: No acute intracranial abnormality or significant change since recent prior study. No large vessel occlusion, hemodynamically significant stenosis, or evidence of dissection. Electronically Signed   By: Macy Mis M.D.   On: 02/27/2021 16:56   X-ray chest PA and lateral  Result Date: 02/26/2021 CLINICAL DATA:  Syncope. EXAM: CHEST - 2 VIEW COMPARISON:  March 25, 2019. FINDINGS: The heart  size and mediastinal contours are within normal limits. Both lungs are clear. The visualized skeletal structures are unremarkable. IMPRESSION: No active cardiopulmonary disease. Electronically Signed   By: Marijo Conception M.D.   On: 02/26/2021 15:54   CT Head Wo Contrast  Result Date: 02/26/2021 CLINICAL DATA:  Altered mental status.  Syncope. EXAM: CT HEAD WITHOUT CONTRAST TECHNIQUE: Contiguous axial images were obtained from the base of the skull through the vertex without intravenous contrast. COMPARISON:  Dec 29, 2020. FINDINGS: Brain: No evidence of acute infarction, hemorrhage, hydrocephalus, extra-axial  collection or mass lesion/mass effect. Vascular: No hyperdense vessel or unexpected calcification. Skull: Normal. Negative for fracture or focal lesion. Sinuses/Orbits: No acute finding. Other: None. IMPRESSION: No acute intracranial abnormality seen. Electronically Signed   By: Marijo Conception M.D.   On: 02/26/2021 13:26   MR BRAIN WO CONTRAST  Result Date: 02/26/2021 CLINICAL DATA:  Recurrent syncope EXAM: MRI HEAD WITHOUT CONTRAST TECHNIQUE: Multiplanar, multiecho pulse sequences of the brain and surrounding structures were obtained without intravenous contrast. COMPARISON:  2020 FINDINGS: Brain: There is no acute infarction or intracranial hemorrhage. There is no intracranial mass, mass effect, or edema. There is no hydrocephalus or extra-axial fluid collection. Ventricles and sulci are within normal limits in size and configuration. Patchy foci of T2 hyperintensity in the supratentorial white matter are nonspecific but may reflect mild chronic microvascular ischemic changes. Small 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 cerebellar infarct. Vascular: Major vessel flow voids at the skull base are preserved. Skull and upper cervical spine: Normal marrow signal is preserved. Sinuses/Orbits: Minor mucosal thickening.  Orbits are unremarkable. Other: Sella is unremarkable.  Mastoid  air cells are clear. IMPRESSION: No evidence of recent infarction, hemorrhage, or mass. Mild chronic microvascular ischemic changes. 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 Electronically Signed   By: Macy Mis M.D.   On: 02/26/2021 19:25   ECHOCARDIOGRAM COMPLETE  Result Date: 02/28/2021    ECHOCARDIOGRAM REPORT   Patient Name:   RAEQUAN VANSCHAICK Date of Exam: 02/27/2021 Medical Rec #:  [id number]           Height:       73.0 in Accession #:    [id number]          Weight:       218.7 lb Date of Birth:  [birth year]            BSA:          2.235 m Patient Age:    61 years            BP:           146/84 mmHg Patient Gender: M                   HR:           52 bpm. Exam Location:  [facility] Procedure: 2D Echo Indications:     Syncope; Dyspnea  History:         Patient has no prior history of Echocardiogram examinations.                  Risk Factors:Hypertension and Dyslipidemia.  Sonographer:     L Thornton-Maynard Referring Phys:  [id number] AMY N COX Diagnosing Phys: Bartholome Bill MD IMPRESSIONS  1. Left ventricular ejection fraction, by estimation, is 60 to 65%. The left ventricle has normal function. The left ventricle has no regional wall motion abnormalities. There is mild left ventricular hypertrophy. Left ventricular diastolic parameters are consistent with Grade I diastolic dysfunction (impaired relaxation).  2. Right ventricular systolic function is normal. The right ventricular size is mildly enlarged. There is normal pulmonary artery systolic pressure.  3. Left atrial size was mildly dilated.  4. Right atrial size was mildly dilated.  5. The mitral valve was not well visualized. Trivial mitral valve regurgitation.  6. The aortic valve was not well visualized. Aortic valve regurgitation is not visualized. FINDINGS  Left Ventricle: Left ventricular ejection fraction, by estimation, is 60  to 65%. The left ventricle has normal function. The left ventricle has no  regional wall motion abnormalities. The left ventricular internal cavity size was normal in size. There is  mild left ventricular hypertrophy. Left ventricular diastolic parameters are consistent with Grade I diastolic dysfunction (impaired relaxation). Right Ventricle: The right ventricular size is mildly enlarged. No increase in right ventricular wall thickness. Right ventricular systolic function is normal. There is normal pulmonary artery systolic pressure. The tricuspid regurgitant velocity is 2.10  m/s, and with an assumed right atrial pressure of 3 mmHg, the estimated right ventricular systolic pressure is 16.1 mmHg. Left Atrium: Left atrial size was mildly dilated. Right Atrium: Right atrial size was mildly dilated. Pericardium: There is no evidence of pericardial effusion. Mitral Valve: The mitral valve was not well visualized. Trivial mitral valve regurgitation. Tricuspid Valve: The tricuspid valve is not well visualized. Tricuspid valve regurgitation is trivial. Aortic Valve: The aortic valve was not well visualized. Aortic valve regurgitation is not visualized. Aortic valve mean gradient measures 7.0 mmHg. Aortic valve peak gradient measures 14.1 mmHg. Aortic valve area, by VTI measures 2.90 cm. Pulmonic Valve: The pulmonic valve was not well visualized. Pulmonic valve regurgitation is trivial. Aorta: The aortic root is normal in size and structure. IAS/Shunts: The atrial septum is grossly normal.  LEFT VENTRICLE PLAX 2D LVIDd:         4.41 cm  Diastology LVIDs:         3.00 cm  LV e' medial:    5.44 cm/s LV PW:         1.36 cm  LV E/e' medial:  13.9 LV IVS:        1.66 cm  LV e' lateral:   10.00 cm/s LVOT diam:     2.50 cm  LV E/e' lateral: 7.6 LV SV:         104 LV SV Index:   46 LVOT Area:     4.91 cm  RIGHT VENTRICLE RV S prime:     13.20 cm/s TAPSE (M-mode): 3.4 cm LEFT ATRIUM             Index LA diam:        4.10 cm 1.83 cm/m LA Vol (A2C):   57.3 ml 25.64 ml/m LA Vol (A4C):   77.7 ml 34.77  ml/m LA Biplane Vol: 68.0 ml 30.43 ml/m  AORTIC VALVE                    PULMONIC VALVE AV Area (Vmax):    2.59 cm     PV Vmax:       0.96 m/s AV Area (Vmean):   2.68 cm     PV Peak grad:  3.7 mmHg AV Area (VTI):     2.90 cm AV Vmax:           188.00 cm/s AV Vmean:          127.000 cm/s AV VTI:            0.357 m AV Peak Grad:      14.1 mmHg AV Mean Grad:      7.0 mmHg LVOT Vmax:         99.20 cm/s LVOT Vmean:        69.300 cm/s LVOT VTI:          0.211 m LVOT/AV VTI ratio: 0.59  AORTA Ao Root diam: 3.70 cm MITRAL VALVE  TRICUSPID VALVE MV Area (PHT): 2.52 cm     TR Peak grad:   17.6 mmHg MV Decel Time: 301 msec     TR Vmax:        210.00 cm/s MV E velocity: 75.80 cm/s MV A velocity: 101.00 cm/s  SHUNTS MV E/A ratio:  0.75         Systemic VTI:  0.21 m                             Systemic Diam: 2.50 cm Bartholome Bill MD Electronically signed by Bartholome Bill MD Signature Date/Time: 02/28/2021/7:56:05 AM    Final     EKG: Sinus rhythm at 67 bpm with right bundle branch block and unifocal PVCs  ASSESSMENT AND PLAN:   Syncope, unknown etiology, nonexertional, in patient with sinus rhythm, right bundle branch block and frequent PVCs, with unremarkable work-up including 2D echocardiogram, head CT, brain MRI, and CT angiogram Frequent PVCs, asymptomatic Essential hypertension, blood pressure well controlled on amlodipine  Recommendations  1.  Agree with current therapy 2.  We will place 30-day Holter monitor 3.  Patient does not experience episode of syncope during 30-day Holter monitor   then will recommend implantable Linq monitor 4.  May discharge home today, follow-up scheduled for 03/14/2021 5.  Requested that the patient refrain from driving until at least follow-up appointment  Signed: Isaias Cowman MD,PhD, [facility] 02/28/2021, 8:40 AM

## 2021-02-28 NOTE — Discharge Summary (Signed)
Physician Discharge Summary  Tyrone Luna CXK:[id number] DOB: [birth year] DOA: 02/26/2021  PCP: [name], MD  Admit date: 02/26/2021 Discharge date: 02/28/2021  Admitted From: Home Discharge disposition: Home   Code Status: Full Code  Diet Recommendation: Cardiac diet  Discharge Diagnosis:   Principal Problem:   Syncope Active Problems:   ED (erectile dysfunction) of organic origin   Hyperlipidemia, unspecified   Apnea, sleep   BPH with obstruction/lower urinary tract symptoms   S/P colostomy takedown   AKI (acute kidney injury) ([place])   Hypokalemia     Chief Complaint  Patient presents with   Loss of Consciousness    Brief narrative: Tyrone Luna is a 75 y.o. male with PMH significant for HTN, HLD, OSA on CPAP, BPH, h/o perforated diverticulitis status post Hartmann procedure, subsequent Hartmann takedown. Patient was brought to the ED on 7/3 after 3 episodes of syncope in last 2 weeks. 02/14/2021, patient syncopized while sitting at his desk working on the computer, hitting his head sustaining a skin tear on the right posterior parietal area of his scalp. 02/19/2021, patient syncopized while sitting on a chair while watching TV, woke up on his own. 02/26/2021, patient felt lightheaded and dizzy, passed out and woke up on his left side on the floor. All 3 syncopal episodes were unwitnessed, without urinary or bowel incontinence. No recent change in medications.  No other focal neurological deficits.  In the interval, patient was seen by his PCP at [facility] clinic, found to have sinus bradycardia with first-degree AV block 6/24, patient was evaluated by cardiologist.  48-hour Holter monitor was placed which resulted showing frequent PVCs.   In the ED, patient was afebrile, was in sinus bradycardia in 50s, blood pressure was initially elevated to 171/85. EKG showed normal sinus rhythm at the rate of 87, ventricular bigeminy, QTC 416 milliseconds, right bundle  branch block CT head did not show any intracranial abnormality MRI of the brain did not show any acute infarction, hemorrhage mass, fluid collection or edema.  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  Admitted to hospitalist service for further evaluation and management  Subjective: Patient was seen and examined this morning.  Pleasant elderly Caucasian male.  Lying on bed.  Not in distress.  No new symptoms.  Wife at bedside.  Hospital course: Recurrent syncope -Unclear cause -2 of the episodes were without any premonition symptoms while the last 1 on the day of admission was preceded by lightheadedness and dizziness. -EKG in the ED shows normal sinus rhythm at 87 bpm but patient has remained mostly bradycardic overnight with a rate as low as 30s last night. -Echocardiogram showed normal EF with grade 1 diastolic dysfunction. -CTA head and neck did not show any intracranial vascular stenosis or large vessel occlusion. -Telemetry monitoring showed multiple PVCs and frequent ventricular bigeminy -Cardiology consultation was obtained with Dr. Saralyn Pilar.  30-day Holter monitor was placed.  Patient to follow-up with cardiology as an outpatient.  Sinus bradycardia -Heart rate mostly in 50s overnight, as low as 30s.  Not on AV nodal blocking agent.  AKI -Improving with IV fluid.  HCTZ/triamterene on hold. -Can resume post discharge. Recent Labs    02/26/21 0845 02/27/21 0518 02/28/21 0507  BUN 19 20 23   CREATININE 1.34* 0.96 0.94   Hypokalemia -Improved with replacement. Recent Labs  Lab 02/26/21 0845 02/27/21 0518 02/28/21 0507  K 3.3* 3.6 3.5  MG 2.2  --  2.3  PHOS  --   --  3.4  Essential hypertension  -Home meds include Norvasc 10 mg daily, HCTZ/triamterene 37.5 mg / 25 mg.   -Continue same post discharge.  Continue to monitor at home.  Hyperlipidemia -Simvastatin  20 mg nightly.  BPH -Continue dutasteride  GERD -PPI twice daily   OSA -Patient had tonsil and uvula surgery for that.  Not on CPAP at home   Wound care:    Discharge Exam:   Vitals:   02/27/21 2037 02/28/21 0228 02/28/21 0459 02/28/21 0813  BP: 139/74  139/75 (!) 149/60  Pulse: (!) 53  61 (!) 44  Resp: 18  20 16   Temp: 98.5 F (36.9 C)  98.5 F (36.9 C) 97.8 F (36.6 C)  TempSrc: Oral  Oral Oral  SpO2: 94%  96% 99%  Weight:  100.8 kg    Height:        Body mass index is 29.33 kg/m.  General exam: Pleasant, elderly Caucasian male.  Not in distress Skin: No rashes, lesions or ulcers. HEENT: Atraumatic, normocephalic, no obvious bleeding Lungs: Clear to auscultation bilaterally CVS: Regular rate and rhythm, no murmur GI/Abd soft, nontender, nondistended, bowel sound present CNS: Alert, awake, oriented x3 Psychiatry: Mood appropriate Extremities: No pedal edema, no calf tenderness  Follow ups:   Discharge Instructions     Diet - low sodium heart healthy   Complete by: As directed    Increase activity slowly   Complete by: As directed        Follow-up Information     [name], MD Follow up.   Specialty: Internal Medicine Contact information: [place] [phone]                 Recommendations for Outpatient Follow-Up:   Follow-up with PCP as an outpatient Follow-up with cardiology as an outpatient  Discharge Instructions:  Follow with Primary MD [name], MD in 7 days   Get CBC/BMP checked in next visit within 1 week by PCP or SNF MD ( we routinely change or add medications that can affect your baseline labs and fluid status, therefore we recommend that you get the mentioned basic workup next visit with your PCP, your PCP may decide not to get them or add new tests based on their clinical decision)  On your next visit with your PCP, please Get Medicines reviewed  and adjusted.  Please request your PCP  to go over all Hospital Tests and Procedure/Radiological results at the follow up, please get all Hospital records sent to your Prim MD by signing hospital release before you go home.  Activity: As tolerated with Full fall precautions use walker/cane & assistance as needed  For Heart failure patients - Check your Weight same time everyday, if you gain over 2 pounds, or you develop in leg swelling, experience more shortness of breath or chest pain, call your Primary MD immediately. Follow Cardiac Low Salt Diet and 1.5 lit/day fluid restriction.  If you have smoked or chewed Tobacco in the last 2 yrs please stop smoking, stop any regular Alcohol  and or any Recreational drug use.  If you experience worsening of your admission symptoms, develop shortness of breath, life threatening emergency, suicidal or homicidal thoughts you must seek medical attention immediately by calling 911 or calling your MD immediately  if symptoms less severe.  You Must read complete instructions/literature along with all the  possible adverse reactions/side effects for all the Medicines you take and that have been prescribed to you. Take any new Medicines after you have completely understood and accpet all the possible adverse reactions/side effects.   Do not drive, operate heavy machinery, perform activities at heights, swimming or participation in water activities or provide baby sitting services if your were admitted for syncope or siezures until you have seen by Primary MD or a Neurologist and advised to do so again.  Do not drive when taking Pain medications.  Do not take more than prescribed Pain, Sleep and Anxiety Medications  Wear Seat belts while driving.   Please note You were cared for by a hospitalist during your hospital stay. If you have any questions about your discharge medications or the care you received while you were in the hospital after you are discharged, you  can call the unit and asked to speak with the hospitalist on call if the hospitalist that took care of you is not available. Once you are discharged, your primary care physician will handle any further medical issues. Please note that NO REFILLS for any discharge medications will be authorized once you are discharged, as it is imperative that you return to your primary care physician (or establish a relationship with a primary care physician if you do not have one) for your aftercare needs so that they can reassess your need for medications and monitor your lab values.    Time coordinating discharge: 35 minutes  Allergies as of 02/28/2021   No Known Allergies      Medication List     TAKE these medications    amLODipine 10 MG tablet Commonly known as: NORVASC Take 10 mg by mouth daily.   aspirin 81 MG tablet Take 81 mg by mouth daily.   dutasteride 0.5 MG capsule Commonly known as: AVODART Take 1 capsule (0.5 mg total) by mouth every evening.   GLUCOSAMINE 1500 COMPLEX PO Take 1 capsule by mouth daily.   ipratropium 0.06 % nasal spray Commonly known as: ATROVENT Place 2 sprays into both nostrils 3 (three) times daily as needed for congestion or allergies.   loratadine 10 MG tablet Commonly known as: CLARITIN Take 10 mg by mouth daily.   pantoprazole 40 MG tablet Commonly known as: PROTONIX Take 40 mg by mouth 2 (two) times daily.   pilocarpine 5 MG tablet Commonly known as: SALAGEN Take 5 mg by mouth 2 (two) times daily.   PRESERVISION AREDS 2 PO Take 1 capsule by mouth 2 (two) times daily.   simvastatin 20 MG tablet Commonly known as: ZOCOR Take 20 mg by mouth every evening.   triamterene-hydrochlorothiazide 37.5-25 MG capsule Commonly known as: DYAZIDE Take 1 capsule by mouth daily.        The results of significant diagnostics from this hospitalization (including imaging, microbiology, ancillary and laboratory) are listed below for reference.     Procedures and Diagnostic Studies:   CT ANGIO HEAD NECK W WO CM  Result Date: 02/27/2021 CLINICAL DATA:  Recurrent syncope EXAM: CT ANGIOGRAPHY HEAD AND NECK TECHNIQUE: Multidetector CT imaging of the head and neck was performed using the standard protocol during bolus administration of intravenous contrast. Multiplanar CT image reconstructions and MIPs were obtained to evaluate the vascular anatomy. Carotid stenosis measurements (when applicable) are obtained utilizing NASCET criteria, using the distal internal carotid diameter as the denominator. CONTRAST:  67mL OMNIPAQUE IOHEXOL 350 MG/ML SOLN COMPARISON:  02/26/2021 CT head FINDINGS: CT HEAD Brain: There is no  acute intracranial hemorrhage, mass effect, or edema. Gray-white differentiation is preserved. There is no extra-axial fluid collection. Ventricles and sulci are stable in size and configuration. Patchy low-attenuation in the supratentorial white matter probably reflects stable chronic microvascular ischemic changes. Small chronic right cerebellar infarct. Vascular: No new findings. Skull: Calvarium is unremarkable. Sinuses/Orbits: No acute finding. Other: None. Review of the MIP images confirms the above findings CTA NECK Aortic arch: Minimal plaque along the arch and great vessel origins. Right carotid system: Patent. Calcified plaque along the proximal internal carotid with less than 50% stenosis. Left carotid system: Patent. Calcified plaque along the proximal internal carotid without stenosis. Vertebral arteries: Patent and codominant. Calcified plaque at the right vertebral origin. No significant stenosis or evidence of dissection. Skeleton: Degenerative changes of the cervical spine. Other neck: Thyroid nodules measuring up to 1.2 cm. No ultrasound follow-up is recommended by current guidelines. Upper chest: Included upper lungs are clear. Review of the MIP images confirms the above findings CTA HEAD Anterior circulation: Intracranial  internal carotid arteries are patent with mild calcified plaque but no significant stenosis. Anterior and middle cerebral arteries are patent. Posterior circulation: Intracranial vertebral arteries are patent with minimal calcified plaque. Basilar artery is patent. Major cerebellar artery origins are patent. Right posterior communicating artery is present. Posterior cerebral arteries are patent. Fetal origin of the right PCA. Venous sinuses: Patent as allowed by contrast bolus timing. Review of the MIP images confirms the above findings IMPRESSION: No acute intracranial abnormality or significant change since recent prior study. No large vessel occlusion, hemodynamically significant stenosis, or evidence of dissection. Electronically Signed   By: Macy Mis M.D.   On: 02/27/2021 16:56   ECHOCARDIOGRAM COMPLETE  Result Date: 02/28/2021    ECHOCARDIOGRAM REPORT   Patient Name:   Tyrone Luna Date of Exam: 02/27/2021 Medical Rec #:  [id number]           Height:       73.0 in Accession #:    [id number]          Weight:       218.7 lb Date of Birth:  [birth year]            BSA:          2.235 m Patient Age:    24 years            BP:           146/84 mmHg Patient Gender: M                   HR:           52 bpm. Exam Location:  [facility] Procedure: 2D Echo Indications:     Syncope; Dyspnea  History:         Patient has no prior history of Echocardiogram examinations.                  Risk Factors:Hypertension and Dyslipidemia.  Sonographer:     L Thornton-Maynard Referring Phys:  [id number] AMY N COX Diagnosing Phys: Bartholome Bill MD IMPRESSIONS  1. Left ventricular ejection fraction, by estimation, is 60 to 65%. The left ventricle has normal function. The left ventricle has no regional wall motion abnormalities. There is mild left ventricular hypertrophy. Left ventricular diastolic parameters are consistent with Grade I diastolic dysfunction (impaired relaxation).  2. Right ventricular systolic function is normal. The  right ventricular size is mildly enlarged. There is normal pulmonary artery systolic pressure.  3.  Left atrial size was mildly dilated.  4. Right atrial size was mildly dilated.  5. The mitral valve was not well visualized. Trivial mitral valve regurgitation.  6. The aortic valve was not well visualized. Aortic valve regurgitation is not visualized. FINDINGS  Left Ventricle: Left ventricular ejection fraction, by estimation, is 60 to 65%. The left ventricle has normal function. The left ventricle has no regional wall motion abnormalities. The left ventricular internal cavity size was normal in size. There is  mild left ventricular hypertrophy. Left ventricular diastolic parameters are consistent with Grade I diastolic dysfunction (impaired relaxation). Right Ventricle: The right ventricular size is mildly enlarged. No increase in right ventricular wall thickness. Right ventricular systolic function is normal. There is normal pulmonary artery systolic pressure. The tricuspid regurgitant velocity is 2.10  m/s, and with an assumed right atrial pressure of 3 mmHg, the estimated right ventricular systolic pressure is 05.3 mmHg. Left Atrium: Left atrial size was mildly dilated. Right Atrium: Right atrial size was mildly dilated. Pericardium: There is no evidence of pericardial effusion. Mitral Valve: The mitral valve was not well visualized. Trivial mitral valve regurgitation. Tricuspid Valve: The tricuspid valve is not well visualized. Tricuspid valve regurgitation is trivial. Aortic Valve: The aortic valve was not well visualized. Aortic valve regurgitation is not visualized. Aortic valve mean gradient measures 7.0 mmHg. Aortic valve peak gradient measures 14.1 mmHg. Aortic valve area, by VTI measures 2.90 cm. Pulmonic Valve: The pulmonic valve was not well visualized. Pulmonic valve regurgitation is trivial. Aorta: The aortic root is normal in size and structure. IAS/Shunts: The atrial septum is grossly normal.  LEFT  VENTRICLE PLAX 2D LVIDd:         4.41 cm  Diastology LVIDs:         3.00 cm  LV e' medial:    5.44 cm/s LV PW:         1.36 cm  LV E/e' medial:  13.9 LV IVS:        1.66 cm  LV e' lateral:   10.00 cm/s LVOT diam:     2.50 cm  LV E/e' lateral: 7.6 LV SV:         104 LV SV Index:   46 LVOT Area:     4.91 cm  RIGHT VENTRICLE RV S prime:     13.20 cm/s TAPSE (M-mode): 3.4 cm LEFT ATRIUM             Index LA diam:        4.10 cm 1.83 cm/m LA Vol (A2C):   57.3 ml 25.64 ml/m LA Vol (A4C):   77.7 ml 34.77 ml/m LA Biplane Vol: 68.0 ml 30.43 ml/m  AORTIC VALVE                    PULMONIC VALVE AV Area (Vmax):    2.59 cm     PV Vmax:       0.96 m/s AV Area (Vmean):   2.68 cm     PV Peak grad:  3.7 mmHg AV Area (VTI):     2.90 cm AV Vmax:           188.00 cm/s AV Vmean:          127.000 cm/s AV VTI:            0.357 m AV Peak Grad:      14.1 mmHg AV Mean Grad:      7.0 mmHg LVOT Vmax:  99.20 cm/s LVOT Vmean:        69.300 cm/s LVOT VTI:          0.211 m LVOT/AV VTI ratio: 0.59  AORTA Ao Root diam: 3.70 cm MITRAL VALVE                TRICUSPID VALVE MV Area (PHT): 2.52 cm     TR Peak grad:   17.6 mmHg MV Decel Time: 301 msec     TR Vmax:        210.00 cm/s MV E velocity: 75.80 cm/s MV A velocity: 101.00 cm/s  SHUNTS MV E/A ratio:  0.75         Systemic VTI:  0.21 m                             Systemic Diam: 2.50 cm Bartholome Bill MD Electronically signed by Bartholome Bill MD Signature Date/Time: 02/28/2021/7:56:05 AM    Final      Labs:   Basic Metabolic Panel: Recent Labs  Lab 02/26/21 0845 02/27/21 0518 02/28/21 0507  NA 138 138 139  K 3.3* 3.6 3.5  CL 102 105 106  CO2 25 24 26   GLUCOSE 115* 99 88  BUN 19 20 23   CREATININE 1.34* 0.96 0.94  CALCIUM 9.5 9.0 9.0  MG 2.2  --  2.3  PHOS  --   --  3.4   GFR Estimated Creatinine Clearance: 86.1 mL/min (by C-G formula based on SCr of 0.94 mg/dL). Liver Function Tests: Recent Labs  Lab 02/26/21 0845 02/27/21 0518  AST 20 17  ALT 14 11  ALKPHOS  31* 32*  BILITOT 2.1* 2.1*  PROT 7.0 6.1*  ALBUMIN 4.3 3.8   No results for input(s): LIPASE, AMYLASE in the last 168 hours. No results for input(s): AMMONIA in the last 168 hours. Coagulation profile No results for input(s): INR, PROTIME in the last 168 hours.  CBC: Recent Labs  Lab 02/26/21 0845 02/27/21 0518 02/28/21 0507  WBC 6.4 6.6 8.4  NEUTROABS  --   --  5.7  HGB 17.0 16.0 15.9  HCT 49.3 46.4 46.7  MCV 89.6 88.9 90.3  PLT 163 159 160   Cardiac Enzymes: No results for input(s): CKTOTAL, CKMB, CKMBINDEX, TROPONINI in the last 168 hours. BNP: Invalid input(s): POCBNP CBG: Recent Labs  Lab 02/27/21 0444 02/28/21 0636  GLUCAP 109* 89   D-Dimer No results for input(s): DDIMER in the last 72 hours. Hgb A1c No results for input(s): HGBA1C in the last 72 hours. Lipid Profile No results for input(s): CHOL, HDL, LDLCALC, TRIG, CHOLHDL, LDLDIRECT in the last 72 hours. Thyroid function studies Recent Labs    02/26/21 0845  TSH 1.718   Anemia work up Recent Labs    02/26/21 [place] 155*   Microbiology Recent Results (from the past 240 hour(s))  SARS CORONAVIRUS 2 (TAT 6-24 HRS) Nasopharyngeal Nasopharyngeal Swab     Status: None   Collection Time: 02/26/21  8:20 PM   Specimen: Nasopharyngeal Swab  Result Value Ref Range Status   SARS Coronavirus 2 NEGATIVE NEGATIVE Final    Comment: (NOTE) SARS-CoV-2 target nucleic acids are NOT DETECTED.  The SARS-CoV-2 RNA is generally detectable in upper and lower respiratory specimens during the acute phase of infection. Negative results do not preclude SARS-CoV-2 infection, do not rule out co-infections with other pathogens, and should not be used as the sole basis for treatment or other patient  management decisions. Negative results must be combined with clinical observations, patient history, and epidemiological information. The expected result is Negative.  Fact Sheet for  Patients: SugarRoll.be  Fact Sheet for Healthcare Providers: https://www.woods-mathews.com/  This test is not yet approved or cleared by the [place] FDA and  has been authorized for detection and/or diagnosis of SARS-CoV-2 by FDA under an Emergency Use Authorization (EUA). This EUA will remain  in effect (meaning this test can be used) for the duration of the COVID-19 declaration under Se ction 564(b)(1) of the Act, 21 U.S.C. section 360bbb-3(b)(1), unless the authorization is terminated or revoked sooner.  Performed at [facility], [place] [address] [place], [place] [address]      Signed: Terrilee Croak  [facility] 02/28/2021, 11:02 AM

## 2021-03-14 DIAGNOSIS — I472 Ventricular tachycardia, unspecified: Secondary | ICD-10-CM | POA: Insufficient documentation

## 2021-03-14 DIAGNOSIS — I480 Paroxysmal atrial fibrillation: Secondary | ICD-10-CM | POA: Insufficient documentation

## 2021-03-14 DIAGNOSIS — I493 Ventricular premature depolarization: Secondary | ICD-10-CM | POA: Insufficient documentation

## 2021-03-21 ENCOUNTER — Inpatient Hospital Stay
Admission: EM | Admit: 2021-03-21 | Discharge: 2021-03-23 | DRG: 229 | Disposition: A | Discharge status: Home / Self Care | Payer: Medicare HMO | Attending: Internal Medicine | Admitting: Internal Medicine

## 2021-03-21 ENCOUNTER — Encounter: Payer: Self-pay | Admitting: Emergency Medicine

## 2021-03-21 ENCOUNTER — Emergency Department: Payer: Medicare HMO

## 2021-03-21 ENCOUNTER — Other Ambulatory Visit: Payer: Self-pay

## 2021-03-21 DIAGNOSIS — E669 Obesity, unspecified: Secondary | ICD-10-CM | POA: Diagnosis present

## 2021-03-21 DIAGNOSIS — I4892 Unspecified atrial flutter: Secondary | ICD-10-CM | POA: Diagnosis present

## 2021-03-21 DIAGNOSIS — Z87891 Personal history of nicotine dependence: Secondary | ICD-10-CM | POA: Diagnosis not present

## 2021-03-21 DIAGNOSIS — I10 Essential (primary) hypertension: Secondary | ICD-10-CM | POA: Diagnosis present

## 2021-03-21 DIAGNOSIS — E785 Hyperlipidemia, unspecified: Secondary | ICD-10-CM | POA: Diagnosis present

## 2021-03-21 DIAGNOSIS — Z950 Presence of cardiac pacemaker: Secondary | ICD-10-CM

## 2021-03-21 DIAGNOSIS — Z85828 Personal history of other malignant neoplasm of skin: Secondary | ICD-10-CM

## 2021-03-21 DIAGNOSIS — I495 Sick sinus syndrome: Principal | ICD-10-CM | POA: Diagnosis present

## 2021-03-21 DIAGNOSIS — Z7982 Long term (current) use of aspirin: Secondary | ICD-10-CM | POA: Diagnosis not present

## 2021-03-21 DIAGNOSIS — M1990 Unspecified osteoarthritis, unspecified site: Secondary | ICD-10-CM | POA: Diagnosis present

## 2021-03-21 DIAGNOSIS — Z20822 Contact with and (suspected) exposure to covid-19: Secondary | ICD-10-CM | POA: Diagnosis present

## 2021-03-21 DIAGNOSIS — R55 Syncope and collapse: Secondary | ICD-10-CM | POA: Diagnosis present

## 2021-03-21 DIAGNOSIS — I472 Ventricular tachycardia: Secondary | ICD-10-CM | POA: Diagnosis present

## 2021-03-21 DIAGNOSIS — E876 Hypokalemia: Secondary | ICD-10-CM | POA: Diagnosis not present

## 2021-03-21 DIAGNOSIS — I442 Atrioventricular block, complete: Secondary | ICD-10-CM

## 2021-03-21 DIAGNOSIS — I4891 Unspecified atrial fibrillation: Secondary | ICD-10-CM | POA: Diagnosis present

## 2021-03-21 DIAGNOSIS — N401 Enlarged prostate with lower urinary tract symptoms: Secondary | ICD-10-CM | POA: Diagnosis present

## 2021-03-21 DIAGNOSIS — G4733 Obstructive sleep apnea (adult) (pediatric): Secondary | ICD-10-CM | POA: Diagnosis present

## 2021-03-21 DIAGNOSIS — Z8249 Family history of ischemic heart disease and other diseases of the circulatory system: Secondary | ICD-10-CM | POA: Diagnosis not present

## 2021-03-21 DIAGNOSIS — Z6830 Body mass index (BMI) 30.0-30.9, adult: Secondary | ICD-10-CM

## 2021-03-21 DIAGNOSIS — Z79899 Other long term (current) drug therapy: Secondary | ICD-10-CM

## 2021-03-21 DIAGNOSIS — K219 Gastro-esophageal reflux disease without esophagitis: Secondary | ICD-10-CM | POA: Diagnosis present

## 2021-03-21 DIAGNOSIS — R351 Nocturia: Secondary | ICD-10-CM | POA: Diagnosis present

## 2021-03-21 LAB — URINALYSIS, COMPLETE (UACMP) WITH MICROSCOPIC
Bacteria, UA: NONE SEEN
Bilirubin Urine: NEGATIVE
Glucose, UA: NEGATIVE mg/dL
Hgb urine dipstick: NEGATIVE
Ketones, ur: 20 mg/dL — AB
Leukocytes,Ua: NEGATIVE
Nitrite: NEGATIVE
Protein, ur: NEGATIVE mg/dL
Specific Gravity, Urine: 1.011 (ref 1.005–1.030)
Squamous Epithelial / HPF: NONE SEEN (ref 0–5)
pH: 9 — ABNORMAL HIGH (ref 5.0–8.0)

## 2021-03-21 LAB — BASIC METABOLIC PANEL
Anion gap: 7 (ref 5–15)
BUN: 18 mg/dL (ref 8–23)
CO2: 26 mmol/L (ref 22–32)
Calcium: 9.1 mg/dL (ref 8.9–10.3)
Chloride: 106 mmol/L (ref 98–111)
Creatinine, Ser: 1.06 mg/dL (ref 0.61–1.24)
GFR, Estimated: >60 mL/min (ref >60)
Glucose, Bld: 106 mg/dL — ABNORMAL HIGH (ref 70–99)
Potassium: 3.7 mmol/L (ref 3.5–5.1)
Sodium: 139 mmol/L (ref 135–145)

## 2021-03-21 LAB — COMPREHENSIVE METABOLIC PANEL
ALT: 52 U/L — ABNORMAL HIGH (ref 0–44)
AST: 69 U/L — ABNORMAL HIGH (ref 15–41)
Albumin: 4.1 g/dL (ref 3.5–5.0)
Alkaline Phosphatase: 34 U/L — ABNORMAL LOW (ref 38–126)
Anion gap: 10 (ref 5–15)
BUN: 18 mg/dL (ref 8–23)
CO2: 25 mmol/L (ref 22–32)
Calcium: 9 mg/dL (ref 8.9–10.3)
Chloride: 105 mmol/L (ref 98–111)
Creatinine, Ser: 1.04 mg/dL (ref 0.61–1.24)
GFR, Estimated: >60 mL/min (ref >60)
Glucose, Bld: 138 mg/dL — ABNORMAL HIGH (ref 70–99)
Potassium: 3.1 mmol/L — ABNORMAL LOW (ref 3.5–5.1)
Sodium: 140 mmol/L (ref 135–145)
Total Bilirubin: 2 mg/dL — ABNORMAL HIGH (ref 0.3–1.2)
Total Protein: 6.6 g/dL (ref 6.5–8.1)

## 2021-03-21 LAB — CBC WITH DIFFERENTIAL/PLATELET
Abs Immature Granulocytes: 0.03 10*3/uL (ref 0.00–0.07)
Basophils Absolute: 0.1 10*3/uL (ref 0.0–0.1)
Basophils Relative: 1 %
Eosinophils Absolute: 0.2 10*3/uL (ref 0.0–0.5)
Eosinophils Relative: 3 %
HCT: 47.5 % (ref 39.0–52.0)
Hemoglobin: 16.7 g/dL (ref 13.0–17.0)
Immature Granulocytes: 0 %
Lymphocytes Relative: 18 %
Lymphs Abs: 1.3 10*3/uL (ref 0.7–4.0)
MCH: 32.1 pg (ref 26.0–34.0)
MCHC: 35.2 g/dL (ref 30.0–36.0)
MCV: 91.3 fL (ref 80.0–100.0)
Monocytes Absolute: 0.7 10*3/uL (ref 0.1–1.0)
Monocytes Relative: 10 %
Neutro Abs: 4.8 10*3/uL (ref 1.7–7.7)
Neutrophils Relative %: 68 %
Platelets: 154 10*3/uL (ref 150–400)
RBC: 5.2 MIL/uL (ref 4.22–5.81)
RDW: 13.1 % (ref 11.5–15.5)
WBC: 7.1 10*3/uL (ref 4.0–10.5)
nRBC: 0 % (ref 0.0–0.2)

## 2021-03-21 LAB — CBC
HCT: 47.2 % (ref 39.0–52.0)
Hemoglobin: 16.4 g/dL (ref 13.0–17.0)
MCH: 31.5 pg (ref 26.0–34.0)
MCHC: 34.7 g/dL (ref 30.0–36.0)
MCV: 90.8 fL (ref 80.0–100.0)
Platelets: 169 10*3/uL (ref 150–400)
RBC: 5.2 MIL/uL (ref 4.22–5.81)
RDW: 12.8 % (ref 11.5–15.5)
WBC: 7.1 10*3/uL (ref 4.0–10.5)
nRBC: 0 % (ref 0.0–0.2)

## 2021-03-21 LAB — TSH: TSH: 0.472 u[IU]/mL (ref 0.350–4.500)

## 2021-03-21 LAB — LIPASE, BLOOD: Lipase: 25 U/L (ref 11–51)

## 2021-03-21 LAB — RESP PANEL BY RT-PCR (FLU A&B, COVID) ARPGX2
Influenza A by PCR: NEGATIVE
Influenza B by PCR: NEGATIVE
SARS Coronavirus 2 by RT PCR: NEGATIVE

## 2021-03-21 LAB — CREATININE, SERUM
Creatinine, Ser: 0.92 mg/dL (ref 0.61–1.24)
GFR, Estimated: >60 mL/min (ref >60)

## 2021-03-21 LAB — TROPONIN I (HIGH SENSITIVITY)
Troponin I (High Sensitivity): 6 ng/L (ref <18)
Troponin I (High Sensitivity): 14 ng/L (ref <18)

## 2021-03-21 LAB — MAGNESIUM: Magnesium: 2.5 mg/dL — ABNORMAL HIGH (ref 1.7–2.4)

## 2021-03-21 IMAGING — DX DG CHEST 1V PORT
1 series · 1 of 1 positions shown · non-contrast
Comparison: [DATE]

CLINICAL DATA: 75-year-old male with a history of syncope

EXAM:
PORTABLE CHEST 1 VIEW

[chest ap]
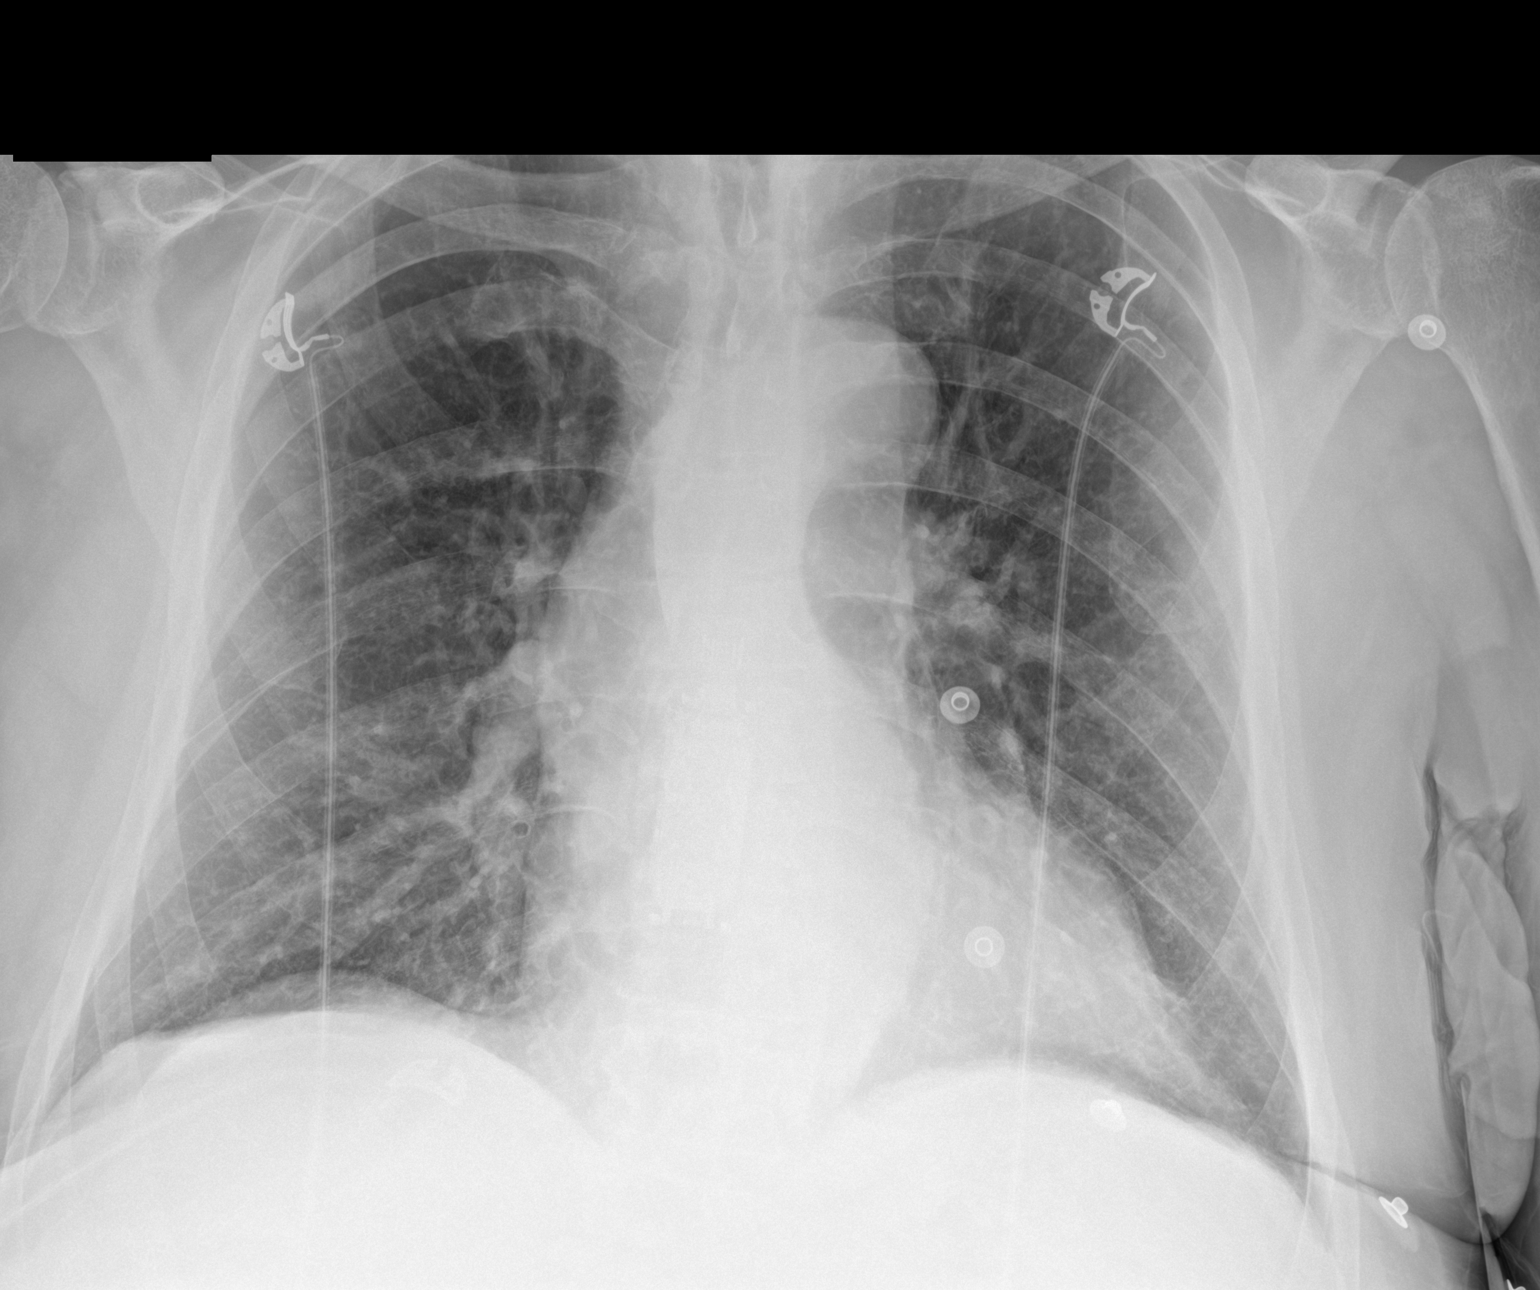

[1 of 1 positions shown; findings below may reference images not displayed]

FINDINGS: Cardiomediastinal silhouette unchanged in size and contour with
tortuosity of the thoracic aorta.

Coarsened interstitial markings of the lungs, similar to the prior.
No pneumothorax or pleural effusion.

Linear opacities at the bilateral lung bases.

Radiopaque device projecting over the lower thoracic spine in the
midline, presumed event recorder.

No new confluent airspace disease.

No displaced fracture
IMPRESSION: Chronic lung changes without evidence of acute cardiopulmonary
disease.

Presumed event recorder projecting over the midline lower chest.

## 2021-03-21 MED ORDER — GLUCOSAMINE 1500 COMPLEX PO CAPS: ORAL_CAPSULE | Freq: Every day | ORAL | Status: DC

## 2021-03-21 MED ORDER — ASPIRIN EC 81 MG PO TBEC
81.0000 mg | DELAYED_RELEASE_TABLET | Freq: Every day | ORAL | Status: DC
  Administered 2021-03-21 – 2021-03-23 (×3): 81 mg via ORAL
  Filled 2021-03-21 (×3): qty 1

## 2021-03-21 MED ORDER — ALBUTEROL SULFATE (2.5 MG/3ML) 0.083% IN NEBU: 2.5000 mg | INHALATION_SOLUTION | RESPIRATORY_TRACT | Status: DC | PRN

## 2021-03-21 MED ORDER — ACETAMINOPHEN 650 MG RE SUPP: 650.0000 mg | Freq: Four times a day (QID) | RECTAL | Status: DC | PRN

## 2021-03-21 MED ORDER — LORATADINE 10 MG PO TABS
10.0000 mg | ORAL_TABLET | Freq: Every day | ORAL | Status: DC
  Administered 2021-03-21 – 2021-03-23 (×3): 10 mg via ORAL
  Filled 2021-03-21 (×3): qty 1

## 2021-03-21 MED ORDER — SIMVASTATIN 20 MG PO TABS
20.0000 mg | ORAL_TABLET | Freq: Every evening | ORAL | Status: DC
  Administered 2021-03-21 – 2021-03-22 (×2): 20 mg via ORAL
  Filled 2021-03-21: qty 2
  Filled 2021-03-21: qty 1

## 2021-03-21 MED ORDER — ENOXAPARIN SODIUM 40 MG/0.4ML IJ SOSY
40.0000 mg | PREFILLED_SYRINGE | INTRAMUSCULAR | Status: DC
  Administered 2021-03-21: 40 mg via SUBCUTANEOUS
  Filled 2021-03-21: qty 0.4

## 2021-03-21 MED ORDER — PILOCARPINE HCL 5 MG PO TABS
5.0000 mg | ORAL_TABLET | Freq: Two times a day (BID) | ORAL | Status: DC
  Administered 2021-03-21 – 2021-03-23 (×3): 5 mg via ORAL
  Filled 2021-03-21 (×5): qty 1

## 2021-03-21 MED ORDER — SODIUM CHLORIDE 0.45 % IV SOLN: INTRAVENOUS | Status: DC

## 2021-03-21 MED ORDER — PANTOPRAZOLE SODIUM 40 MG PO TBEC
40.0000 mg | DELAYED_RELEASE_TABLET | Freq: Two times a day (BID) | ORAL | Status: DC
  Administered 2021-03-21 – 2021-03-23 (×4): 40 mg via ORAL
  Filled 2021-03-21 (×4): qty 1

## 2021-03-21 MED ORDER — IPRATROPIUM BROMIDE 0.06 % NA SOLN
2.0000 | Freq: Three times a day (TID) | NASAL | Status: DC | PRN
  Filled 2021-03-21: qty 15

## 2021-03-21 MED ORDER — AMLODIPINE BESYLATE 10 MG PO TABS
10.0000 mg | ORAL_TABLET | Freq: Every day | ORAL | Status: DC
  Administered 2021-03-21 – 2021-03-23 (×3): 10 mg via ORAL
  Filled 2021-03-21: qty 1
  Filled 2021-03-21: qty 2
  Filled 2021-03-21: qty 1

## 2021-03-21 MED ORDER — DUTASTERIDE 0.5 MG PO CAPS
0.5000 mg | ORAL_CAPSULE | Freq: Every evening | ORAL | Status: DC
  Administered 2021-03-21 – 2021-03-22 (×2): 0.5 mg via ORAL
  Filled 2021-03-21 (×2): qty 1

## 2021-03-21 MED ORDER — ACETAMINOPHEN 325 MG PO TABS: 650.0000 mg | ORAL_TABLET | Freq: Four times a day (QID) | ORAL | Status: DC | PRN

## 2021-03-21 MED ORDER — POTASSIUM CHLORIDE CRYS ER 20 MEQ PO TBCR
40.0000 meq | EXTENDED_RELEASE_TABLET | Freq: Once | ORAL | Status: AC
  Administered 2021-03-21: 40 meq via ORAL
  Filled 2021-03-21: qty 2

## 2021-03-21 MED ORDER — POTASSIUM CHLORIDE 10 MEQ/100ML IV SOLN
10.0000 meq | Freq: Once | INTRAVENOUS | Status: AC
  Administered 2021-03-21: 10 meq via INTRAVENOUS
  Filled 2021-03-21: qty 100

## 2021-03-21 MED ORDER — SODIUM CHLORIDE 0.9 % IV SOLN: INTRAVENOUS | Status: DC

## 2021-03-21 NOTE — ED Triage Notes (Signed)
Pt to triage via w/c with no distress noted; c/o dizziness accomp by N/V; denies any pain; st hx of same with no negative findings, currently has card monitor in place

## 2021-03-21 NOTE — H&P (Addendum)
History and Physical    Tyrone Luna [id number] DOB: 1946-04-06 DOA: 03/21/2021  PCP: [name], MD  Patient coming from: home  I have personally briefly reviewed patient's old medical records in [place]  Chief Complaint: syncope  HPI: Tyrone Luna is a 75 y.o. male with medical history significant for  hypertension, hyperlipidemia, history of perforated diverticulitis status post Hartmann procedure, history of Hartmann takedown, OSA s/p surgical treatment, BPH on alpha blockade, as well as history of recurrent syncopal episodes that began on 6/21 w/o prodrome , no associated chest pain, or palpitations but s/p episodes patient has felt diaphoretic and has had feeling of nausea /emesis.  Patient has had total of 4 episodes since onset of symptoms on 6/21. Patient was placed on 48 hour holter by pcp  and was referred to cardiology . On initial evaluation by pcp EKG was notable for sinus bradycardia with first-degree AV block .  In addition patient 48 hour Holter monitor was notable sinus bradycardia with a mean heart rate of 57 bpm, heart rate range 48 to 131 bpm, with frequent premature ventricular contractions.  Patient had repeat episode 7/3 ( episode 3/4) and presented to ed and was admitted. During hospitalization evaluation was notable for EKG which showed normal sinus rhythm at the rate of 87, ventricular bigeminy, QTC 416 milliseconds, right bundle branch block. Patient also underwent neuro evaluation which was notable for non-acute MRI as well ad CTA and echo that showed normal EF with grade 1 diastolic dysfunction. Patient was seen by Dr. Saralyn Pilar and 30-day Holter monitor was ordered.   Patient had cardiology follow up on 7/19 and on review at that time of 30 day Holter on day 14 of 30, results showed predominant sinus rhythm, frequent premature ventricular contractions, intermittent episodes of atrial fibrillation and atrial flutter, with 3 episodes of  wide-complex tachycardia of 4, 6, and 24 beats. Patient was then referred to [facility] as well as scheduled for left heart cath due to episodes of ventricular tachycardia. Of note at that time no medication changes were made. Patient now returns prior to completion of out patient work up with  4th  episode of syncope. Per patient he was sitting on couch and had LOC w/o prodrome. Patient  states unclear how long it lasted but when he came to he was nauseated and had episode of emesis. He pressed monitor button and was thereafter referred to ed by Holter monitor staff. Patient denies any sob, chest pain, or current palpitations, or any episodes of bladder or bowel incontinence with episodes.  He does note that s/p episode he is significantly fatigued and this feeling last for a few hours.   ED Course:  Vitals :98, bp 159/79, hr 54, rr 18, sat 97% on ra  EKG: sinus at rate of 59, 1st degree AV block ,  with PVC, and know RBBB No significant change from prior  [id number] Labs: K:3.1,ast 69, alt 52, mag 2.5 CE6 Respiratroy panel neg  Tx: KcL  16mg iv, 40 meq po  Review of Systems: As per HPI otherwise 10 point review of systems negative.   Past Medical History:  Diagnosis Date   Arthritis    BPH associated with nocturia    [place]    SKIN CA-BASAL AND SQUAMOUS    ED (erectile dysfunction)    Erectile dysfunction    Family history of adverse reaction to anesthesia    SISTER-N/V   Hyperlipemia    Hypertension  Male hypogonadism    Sleep apnea    H/O HAD SURGERY-UUU   Urinary frequency    Urinary urgency     Past Surgical History:  Procedure Laterality Date   ABDOMINAL WALL DEFECT REPAIR  10/13/2019   Procedure: REPAIR ABDOMINAL WALL (ABDOMINAL WALL RECONSTRUCTION WITH MESH);  Surgeon: Jules Husbands, MD;  Location: [facility] ORS;  Service: General;;   APPENDECTOMY  10/13/2019   Procedure: APPENDECTOMY;  Surgeon: Jules Husbands, MD;  Location: [facility] ORS;  Service: General;;   APPLICATION  OF WOUND VAC  10/13/2019   Procedure: APPLICATION OF WOUND VAC;  Surgeon: Jules Husbands, MD;  Location: [facility] ORS;  Service: General;;  SERIAL NUMBERWU:[id number]   COLONOSCOPY WITH PROPOFOL N/A 07/17/2019   Procedure: COLONOSCOPY WITH PROPOFOL;  Surgeon: [name], MD;  Location: [facility] ENDOSCOPY;  Service: Gastroenterology;  Laterality: N/A;   COLOSTOMY REVERSAL N/A 10/13/2019   Procedure: COLOSTOMY REVERSAL-colostomy takedown-open;  Surgeon: Jules Husbands, MD;  Location: [facility] ORS;  Service: General;  Laterality: N/A;   EYE SURGERY Bilateral 2002   laser   INGUINAL HERNIA REPAIR Left    KNEE SURGERY Right 1995   LAPAROTOMY N/A 03/15/2019   Procedure: EXPLORATORY LAPAROTOMY,sigmoid colectomy,colostomy;  Surgeon: Jules Husbands, MD;  Location: [facility] ORS;  Service: General;  Laterality: N/A;   UVULOPALATOPHARYNGOPLASTY (UPPP)/TONSILLECTOMY/SEPTOPLASTY       reports that he has quit smoking. His smoking use included cigarettes. He has a 1.50 pack-year smoking history. He has never used smokeless tobacco. He reports that he does not drink alcohol and does not use drugs.  No Known Allergies  Family History  Problem Relation Age of Onset   Hypertension Other    Prostate cancer Neg Hx    Bladder Cancer Neg Hx    Kidney cancer Neg Hx    Prior to Admission medications   Medication Sig Start Date End Date Taking? Authorizing Provider  amLODipine (NORVASC) 10 MG tablet Take 10 mg by mouth daily.    [provider]  aspirin 81 MG tablet Take 81 mg by mouth daily.    [provider]  dutasteride (AVODART) 0.5 MG capsule Take 1 capsule (0.5 mg total) by mouth every evening. 02/28/21   Terrilee Croak, MD  Glucosamine-Chondroit-Vit C-Mn (GLUCOSAMINE 1500 COMPLEX PO) Take 1 capsule by mouth daily.     [provider]  ipratropium (ATROVENT) 0.06 % nasal spray Place 2 sprays into both nostrils 3 (three) times daily as needed for congestion or allergies.    [provider]   loratadine (CLARITIN) 10 MG tablet Take 10 mg by mouth daily.    [provider]  Multiple Vitamins-Minerals (PRESERVISION AREDS 2 PO) Take 1 capsule by mouth 2 (two) times daily.    [provider]  pantoprazole (PROTONIX) 40 MG tablet Take 40 mg by mouth 2 (two) times daily.    [provider]  pilocarpine (SALAGEN) 5 MG tablet Take 5 mg by mouth 2 (two) times daily.  03/18/15   [provider]  simvastatin (ZOCOR) 20 MG tablet Take 20 mg by mouth every evening.    [provider]  triamterene-hydrochlorothiazide (DYAZIDE) 37.5-25 MG per capsule Take 1 capsule by mouth daily.  03/18/15   [provider]    Physical Exam: Vitals:   03/21/21 1039 03/21/21 1100 03/21/21 1130 03/21/21 1200  BP:  (!) 146/64 133/87 (!) 145/99  Pulse:  67 (!) 35 (!) 35  Resp:  '18 18 17  '$ Temp: 98.6 F (37  C)     TempSrc: Oral     SpO2:  97% 96% 98%  Weight:      Height:         Vitals:   03/21/21 1039 03/21/21 1100 03/21/21 1130 03/21/21 1200  BP:  (!) 146/64 133/87 (!) 145/99  Pulse:  67 (!) 35 (!) 35  Resp:  '18 18 17  '$ Temp: 98.6 F (37 C)     TempSrc: Oral     SpO2:  97% 96% 98%  Weight:      Height:      Constitutional: NAD, calm, comfortable Eyes: PERRL, lids and conjunctivae normal ENMT: Mucous membranes are moist. Posterior pharynx clear of any exudate or lesions.Normal dentition.  Neck: normal, supple, no masses, no thyromegaly Respiratory: clear to auscultation bilaterally, no wheezing, no crackles. Normal respiratory effort. No accessory muscle use.  Cardiovascular: Regular rate and rhythm, no murmurs / rubs / gallops. + extremity edema. 2+ pedal pulses. No carotid bruits.  Abdomen: no tenderness, no masses palpated. No hepatosplenomegaly. Bowel sounds positive.  Musculoskeletal: no clubbing / cyanosis. No joint deformity upper and lower extremities. Good ROM, no contractures. Normal muscle tone.  Skin: no rashes, lesions, ulcers. No  induration Neurologic: CN 2-12 grossly intact. Sensation intact, DTR normal. Strength 5/5 in all 4.  Psychiatric: Normal judgment and insight. Alert and oriented x 3. Normal mood.    Labs on Admission: I have personally reviewed following labs and imaging studies  CBC: Recent Labs  Lab 03/21/21 0623  WBC 7.1  NEUTROABS 4.8  HGB 16.7  HCT 47.5  MCV 91.3  PLT [id number]   Basic Metabolic Panel: Recent Labs  Lab 03/21/21 0623  NA 140  K 3.1*  CL 105  CO2 25  GLUCOSE 138*  BUN 18  CREATININE 1.04  CALCIUM 9.0   GFR: Estimated Creatinine Clearance: 76.1 mL/min (by C-G formula based on SCr of 1.04 mg/dL). Liver Function Tests: Recent Labs  Lab 03/21/21 0623  AST 69*  ALT 52*  ALKPHOS 34*  BILITOT 2.0*  PROT 6.6  ALBUMIN 4.1   Recent Labs  Lab 03/21/21 0623  LIPASE 25   No results for input(s): AMMONIA in the last 168 hours. Coagulation Profile: No results for input(s): INR, PROTIME in the last 168 hours. Cardiac Enzymes: No results for input(s): CKTOTAL, CKMB, CKMBINDEX, TROPONINI in the last 168 hours. BNP (last 3 results) No results for input(s): PROBNP in the last 8760 hours. HbA1C: No results for input(s): HGBA1C in the last 72 hours. CBG: No results for input(s): GLUCAP in the last 168 hours. Lipid Profile: No results for input(s): CHOL, HDL, LDLCALC, TRIG, CHOLHDL, LDLDIRECT in the last 72 hours. Thyroid Function Tests: No results for input(s): TSH, T4TOTAL, FREET4, T3FREE, THYROIDAB in the last 72 hours. Anemia Panel: No results for input(s): VITAMINB12, FOLATE, FERRITIN, TIBC, IRON, RETICCTPCT in the last 72 hours. Urine analysis:    Component Value Date/Time   COLORURINE YELLOW (A) 03/21/2021 0941   APPEARANCEUR HAZY (A) 03/21/2021 0941   APPEARANCEUR Clear 03/22/2015 1012   LABSPEC 1.011 03/21/2021 0941   PHURINE 9.0 (H) 03/21/2021 0941   GLUCOSEU NEGATIVE 03/21/2021 0941   HGBUR NEGATIVE 03/21/2021 0941   BILIRUBINUR NEGATIVE 03/21/2021 0941    BILIRUBINUR Negative 03/22/2015 1012   KETONESUR 20 (A) 03/21/2021 0941   PROTEINUR NEGATIVE 03/21/2021 0941   NITRITE NEGATIVE 03/21/2021 0941   LEUKOCYTESUR NEGATIVE 03/21/2021 0941    Radiological Exams on Admission: DG Chest Portable 1 View  Result Date:  03/21/2021 CLINICAL DATA:  75 year old male with a history of syncope EXAM: PORTABLE CHEST 1 VIEW COMPARISON:  02/26/2021 FINDINGS: Cardiomediastinal silhouette unchanged in size and contour with tortuosity of the thoracic aorta. Coarsened interstitial markings of the lungs, similar to the prior. No pneumothorax or pleural effusion. Linear opacities at the bilateral lung bases. Radiopaque device projecting over the lower thoracic spine in the midline, presumed event recorder. No new confluent airspace disease. No displaced fracture IMPRESSION: Chronic lung changes without evidence of acute cardiopulmonary disease. Presumed event recorder projecting over the midline lower chest. Electronically Signed   By: Corrie Mckusick D.O.   On: 03/21/2021 10:34    EKG: Independently reviewed. See abvoe  Assessment/Plan Recurrent Syncope  -due to cardiac arrhythmia ,possible ventricular tachycardia vs atrial fib/flutter /Sick sinus disease -awaiting holter monitor event strip  -admit to cardiac tele  -Dr Marcelline Deist consulted by ed, await cards recs -ensure electrolytes remain stable    P Afib /Flutter/Sick sinus disease Ventricular tachycardia -noted on holter at day 14 review  -referred to [place]  -cardiology to leave rec re-anticoagulation/ further inpatient evaluation  -? Need for expedited cath /EP evaluation based on frequency of events -possible pacemaker placement   Hypertension - continue on home Norvasc 10 mg,  BPH -continue on dutasteride 0.5 mg daily resumed   Mild hypokalemia -suspect secondary to hydrochlorothiazide/which was d/ced by pcp 7/11 - Magnesium was within normal limits - Status post potassium chloride 40 mEq p.o.,  potassium chloride 10 mill equivalent IV x1  -repeat bmp this evening   Mild elevation lfts -monitor labs -possible side-effect of statin   Hyperlipidemia - continue simvastatin 20 mg nightly  GERD continue PPI twice daily resumed  OSA - surgically tx   DVT prophylaxis: lwmh   Code Status: FULL Family Communication: spouse at bedside  Disposition Plan: patient  expected to be admitted greater than 2 midnights  Consults called: Cardiology Cal[place] MD Admission status: tele obs   Clance Boll MD [facility]  If 7PM-7AM, please contact night-coverage [email or web address] Password [facility]  03/21/2021, 12:29 PM

## 2021-03-21 NOTE — ED Notes (Signed)
Lab called to add on mag at this time.

## 2021-03-21 NOTE — Consult Note (Signed)
CARDIOLOGY CONSULT NOTE               Patient ID: Tyrone Luna MRN: [id number] DOB/AGE: 1946-06-20 75 y.o.  Admit date: 03/21/2021 Referring Physician Dr Myles Rosenthal hospitalist Primary Physician Dr. Georgie Chard primary Primary Cardiologist Dr. Saralyn Pilar Reason for Consultation syncope heart block  HPI: Patient is a 75 year old white male being evaluated for syncope lightheaded dizziness patient had a few episodes with been evaluated at pressures of possible coronary disease including cardiac cath and referral to EP for further evaluation patient had an episode of syncope today EKG and Holter showed evidence of sick sinus syndrome complete heart block patient complain generalized fatigue weakness and syncope no significant chest pain with worsening symptoms-patient finally came to emergency room for evaluation after syncope and lightheadedness denies significant palpitations and tachycardia no leg edema not much in way of chest pain  Review of systems complete and found to be negative unless listed above     Past Medical History:  Diagnosis Date   Arthritis    BPH associated with nocturia    Cancer ([place])    SKIN CA-BASAL AND SQUAMOUS    ED (erectile dysfunction)    Erectile dysfunction    Family history of adverse reaction to anesthesia    SISTER-N/V   Hyperlipemia    Hypertension    Male hypogonadism    Sleep apnea    H/O HAD SURGERY-UUU   Urinary frequency    Urinary urgency     Past Surgical History:  Procedure Laterality Date   ABDOMINAL WALL DEFECT REPAIR  10/13/2019   Procedure: REPAIR ABDOMINAL WALL (ABDOMINAL WALL RECONSTRUCTION WITH MESH);  Surgeon: Jules Husbands, MD;  Location: [facility] ORS;  Service: General;;   APPENDECTOMY  10/13/2019   Procedure: APPENDECTOMY;  Surgeon: Jules Husbands, MD;  Location: [facility] OR[facility];  Service: General;;   APPLICATION OF WOUND VAC  10/13/2019   Procedure: APPLICATION OF WOUND VAC;  Surgeon: Jules Husbands, MD;  Location:  [facility] ORS;  Service: General;;  SERIAL NUMBERWU:[id number]   COLONOSCOPY WITH PROPOFOL N/A 07/17/2019   Procedure: COLONOSCOPY WITH PROPOFOL;  Surgeon: [name], MD;  Location: [facility] ENDOSCOPY;  Service: Gastroenterology;  Laterality: N/A;   COLOSTOMY REVERSAL N/A 10/13/2019   Procedure: COLOSTOMY REVERSAL-colostomy takedown-open;  Surgeon: Jules Husbands, MD;  Location: [facility] ORS;  Service: General;  Laterality: N/A;   EYE SURGERY Bilateral 2002   laser   INGUINAL HERNIA REPAIR Left    KNEE SURGERY Right 1995   LAPAROTOMY N/A 03/15/2019   Procedure: EXPLORATORY LAPAROTOMY,sigmoid colectomy,colostomy;  Surgeon: Jules Husbands, MD;  Location: [facility] ORS;  Service: General;  Laterality: N/A;   UVULOPALATOPHARYNGOPLASTY (UPPP)/TONSILLECTOMY/SEPTOPLASTY      (Not in a hospital admission)  Social History   Socioeconomic History   Marital status: Married    Spouse name: Not on file   Number of children: Not on file   Years of education: Not on file   Highest education level: Not on file  Occupational History   Not on file  Tobacco Use   Smoking status: Former    Packs/day: 0.50    Years: 3.00    Pack years: 1.50    Types: Cigarettes   Smokeless tobacco: Never   Tobacco comments:    quit 50 years ago  Vaping Use   Vaping Use: Never used  Substance and Sexual Activity   Alcohol use: No    Alcohol/week: 0.0 standard drinks   Drug use: No  Sexual activity: Yes  Other Topics Concern   Not on file  Social History Narrative   Not on file   Social Determinants of Health   Financial Resource Strain: Not on file  Food Insecurity: Not on file  Transportation Needs: Not on file  Physical Activity: Not on file  Stress: Not on file  Social Connections: Not on file  Intimate Partner Violence: Not on file    Family History  Problem Relation Age of Onset   Hypertension Other    Prostate cancer Neg Hx    Bladder Cancer Neg Hx    Kidney cancer Neg Hx       Review of systems  complete and found to be negative unless listed above      PHYSICAL EXAM  General: Well developed, well nourished, in no acute distress HEENT:  Normocephalic and atramatic Neck:  No JVD.  Lungs: Clear bilaterally to auscultation and percussion. Heart: HRRR . Normal S1 and S2 without gallops or murmurs.  Abdomen: Bowel sounds are positive, abdomen soft and non-tender  Msk:  Back normal, normal gait. Normal strength and tone for age. Extremities: No clubbing, cyanosis or edema.   Neuro: Alert and oriented X 3. Psych:  Good affect, responds appropriately  Labs:   Lab Results  Component Value Date   WBC 7.1 03/21/2021   HGB 16.7 03/21/2021   HCT 47.5 03/21/2021   MCV 91.3 03/21/2021   PLT 154 03/21/2021    Recent Labs  Lab 03/21/21 0623  NA 140  K 3.1*  CL 105  CO2 25  BUN 18  CREATININE 1.04  CALCIUM 9.0  PROT 6.6  BILITOT 2.0*  ALKPHOS 34*  ALT 52*  AST 69*  GLUCOSE 138*   No results found for: CKTOTAL, CKMB, CKMBINDEX, TROPONINI No results found for: CHOL No results found for: HDL No results found for: [facility] Lab Results  Component Value Date   TRIG 218 (H) 03/17/2019   No results found for: CHOLHDL No results found for: LDLDIRECT    Radiology: CT ANGIO HEAD NECK W WO CM  Result Date: 02/27/2021 CLINICAL DATA:  Recurrent syncope EXAM: CT ANGIOGRAPHY HEAD AND NECK TECHNIQUE: Multidetector CT imaging of the head and neck was performed using the standard protocol during bolus administration of intravenous contrast. Multiplanar CT image reconstructions and MIPs were obtained to evaluate the vascular anatomy. Carotid stenosis measurements (when applicable) are obtained utilizing NASCET criteria, using the distal internal carotid diameter as the denominator. CONTRAST:  53m OMNIPAQUE IOHEXOL 350 MG/ML SOLN COMPARISON:  02/26/2021 CT head FINDINGS: CT HEAD Brain: There is no acute intracranial hemorrhage, mass effect, or edema. Gray-white differentiation is preserved.  There is no extra-axial fluid collection. Ventricles and sulci are stable in size and configuration. Patchy low-attenuation in the supratentorial white matter probably reflects stable chronic microvascular ischemic changes. Small chronic right cerebellar infarct. Vascular: No new findings. Skull: Calvarium is unremarkable. Sinuses/Orbits: No acute finding. Other: None. Review of the MIP images confirms the above findings CTA NECK Aortic arch: Minimal plaque along the arch and great vessel origins. Right carotid system: Patent. Calcified plaque along the proximal internal carotid with less than 50% stenosis. Left carotid system: Patent. Calcified plaque along the proximal internal carotid without stenosis. Vertebral arteries: Patent and codominant. Calcified plaque at the right vertebral origin. No significant stenosis or evidence of dissection. Skeleton: Degenerative changes of the cervical spine. Other neck: Thyroid nodules measuring up to 1.2 cm. No ultrasound follow-up is recommended by current guidelines. Upper  chest: Included upper lungs are clear. Review of the MIP images confirms the above findings CTA HEAD Anterior circulation: Intracranial internal carotid arteries are patent with mild calcified plaque but no significant stenosis. Anterior and middle cerebral arteries are patent. Posterior circulation: Intracranial vertebral arteries are patent with minimal calcified plaque. Basilar artery is patent. Major cerebellar artery origins are patent. Right posterior communicating artery is present. Posterior cerebral arteries are patent. Fetal origin of the right PCA. Venous sinuses: Patent as allowed by contrast bolus timing. Review of the MIP images confirms the above findings IMPRESSION: No acute intracranial abnormality or significant change since recent prior study. No large vessel occlusion, hemodynamically significant stenosis, or evidence of dissection. Electronically Signed   By: Macy Mis M.D.   On:  02/27/2021 16:56   X-ray chest PA and lateral  Result Date: 02/26/2021 CLINICAL DATA:  Syncope. EXAM: CHEST - 2 VIEW COMPARISON:  March 25, 2019. FINDINGS: The heart size and mediastinal contours are within normal limits. Both lungs are clear. The visualized skeletal structures are unremarkable. IMPRESSION: No active cardiopulmonary disease. Electronically Signed   By: Marijo Conception M.D.   On: 02/26/2021 15:54   CT Head Wo Contrast  Result Date: 02/26/2021 CLINICAL DATA:  Altered mental status.  Syncope. EXAM: CT HEAD WITHOUT CONTRAST TECHNIQUE: Contiguous axial images were obtained from the base of the skull through the vertex without intravenous contrast. COMPARISON:  Dec 29, 2020. FINDINGS: Brain: No evidence of acute infarction, hemorrhage, hydrocephalus, extra-axial collection or mass lesion/mass effect. Vascular: No hyperdense vessel or unexpected calcification. Skull: Normal. Negative for fracture or focal lesion. Sinuses/Orbits: No acute finding. Other: None. IMPRESSION: No acute intracranial abnormality seen. Electronically Signed   By: Marijo Conception M.D.   On: 02/26/2021 13:26   MR BRAIN WO CONTRAST  Result Date: 02/26/2021 CLINICAL DATA:  Recurrent syncope EXAM: MRI HEAD WITHOUT CONTRAST TECHNIQUE: Multiplanar, multiecho pulse sequences of the brain and surrounding structures were obtained without intravenous contrast. COMPARISON:  2020 FINDINGS: Brain: There is no acute infarction or intracranial hemorrhage. There is no intracranial mass, mass effect, or edema. There is no hydrocephalus or extra-axial fluid collection. Ventricles and sulci are within normal limits in size and configuration. Patchy foci of T2 hyperintensity in the supratentorial white matter are nonspecific but may reflect mild chronic microvascular ischemic changes. Small 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 cerebellar infarct. Vascular: Major vessel flow voids at the skull base are preserved. Skull  and upper cervical spine: Normal marrow signal is preserved. Sinuses/Orbits: Minor mucosal thickening.  Orbits are unremarkable. Other: Sella is unremarkable.  Mastoid air cells are clear. IMPRESSION: No evidence of recent infarction, hemorrhage, or mass. Mild chronic microvascular ischemic changes. 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 Electronically Signed   By: Macy Mis M.D.   On: 02/26/2021 19:25   DG Chest Portable 1 View  Result Date: 03/21/2021 CLINICAL DATA:  75 year old male with a history of syncope EXAM: PORTABLE CHEST 1 VIEW COMPARISON:  02/26/2021 FINDINGS: Cardiomediastinal silhouette unchanged in size and contour with tortuosity of the thoracic aorta. Coarsened interstitial markings of the lungs, similar to the prior. No pneumothorax or pleural effusion. Linear opacities at the bilateral lung bases. Radiopaque device projecting over the lower thoracic spine in the midline, presumed event recorder. No new confluent airspace disease. No displaced fracture IMPRESSION: Chronic lung changes without evidence of acute cardiopulmonary disease. Presumed event recorder projecting over the midline lower chest. Electronically Signed  By: Corrie Mckusick D.O.   On: 03/21/2021 10:34   ECHOCARDIOGRAM COMPLETE  Result Date: 02/28/2021    ECHOCARDIOGRAM REPORT   Patient Name:   KAYNE COULTAS Date of Exam: 02/27/2021 Medical Rec #:  [id number]           Height:       73.0 in Accession #:    [id number]          Weight:       218.7 lb Date of Birth:  1946-05-03            BSA:          2.235 m Patient Age:    38 years            BP:           146/84 mmHg Patient Gender: M                   HR:           52 bpm. Exam Location:  [facility] Procedure: 2D Echo Indications:     Syncope; Dyspnea  History:         Patient has no prior history of Echocardiogram examinations.                  Risk Factors:Hypertension and Dyslipidemia.  Sonographer:     L Thornton-Maynard  Referring Phys:  [id number] AMY N COX Diagnosing Phys: Bartholome Bill MD IMPRESSIONS  1. Left ventricular ejection fraction, by estimation, is 60 to 65%. The left ventricle has normal function. The left ventricle has no regional wall motion abnormalities. There is mild left ventricular hypertrophy. Left ventricular diastolic parameters are consistent with Grade I diastolic dysfunction (impaired relaxation).  2. Right ventricular systolic function is normal. The right ventricular size is mildly enlarged. There is normal pulmonary artery systolic pressure.  3. Left atrial size was mildly dilated.  4. Right atrial size was mildly dilated.  5. The mitral valve was not well visualized. Trivial mitral valve regurgitation.  6. The aortic valve was not well visualized. Aortic valve regurgitation is not visualized. FINDINGS  Left Ventricle: Left ventricular ejection fraction, by estimation, is 60 to 65%. The left ventricle has normal function. The left ventricle has no regional wall motion abnormalities. The left ventricular internal cavity size was normal in size. There is  mild left ventricular hypertrophy. Left ventricular diastolic parameters are consistent with Grade I diastolic dysfunction (impaired relaxation). Right Ventricle: The right ventricular size is mildly enlarged. No increase in right ventricular wall thickness. Right ventricular systolic function is normal. There is normal pulmonary artery systolic pressure. The tricuspid regurgitant velocity is 2.10  m/s, and with an assumed right atrial pressure of 3 mmHg, the estimated right ventricular systolic pressure is 123XX123 mmHg. Left Atrium: Left atrial size was mildly dilated. Right Atrium: Right atrial size was mildly dilated. Pericardium: There is no evidence of pericardial effusion. Mitral Valve: The mitral valve was not well visualized. Trivial mitral valve regurgitation. Tricuspid Valve: The tricuspid valve is not well visualized. Tricuspid valve regurgitation is  trivial. Aortic Valve: The aortic valve was not well visualized. Aortic valve regurgitation is not visualized. Aortic valve mean gradient measures 7.0 mmHg. Aortic valve peak gradient measures 14.1 mmHg. Aortic valve area, by VTI measures 2.90 cm. Pulmonic Valve: The pulmonic valve was not well visualized. Pulmonic valve regurgitation is trivial. Aorta: The aortic root is normal in size and structure. IAS/Shunts: The atrial septum is grossly normal.  LEFT VENTRICLE PLAX 2D LVIDd:         4.41 cm  Diastology LVIDs:         3.00 cm  LV e' medial:    5.44 cm/s LV PW:         1.36 cm  LV E/e' medial:  13.9 LV IVS:        1.66 cm  LV e' lateral:   10.00 cm/s LVOT diam:     2.50 cm  LV E/e' lateral: 7.6 LV SV:         104 LV SV Index:   46 LVOT Area:     4.91 cm  RIGHT VENTRICLE RV S prime:     13.20 cm/s TAPSE (M-mode): 3.4 cm LEFT ATRIUM             Index LA diam:        4.10 cm 1.83 cm/m LA Vol (A2C):   57.3 ml 25.64 ml/m LA Vol (A4C):   77.7 ml 34.77 ml/m LA Biplane Vol: 68.0 ml 30.43 ml/m  AORTIC VALVE                    PULMONIC VALVE AV Area (Vmax):    2.59 cm     PV Vmax:       0.96 m/s AV Area (Vmean):   2.68 cm     PV Peak grad:  3.7 mmHg AV Area (VTI):     2.90 cm AV Vmax:           188.00 cm/s AV Vmean:          127.000 cm/s AV VTI:            0.357 m AV Peak Grad:      14.1 mmHg AV Mean Grad:      7.0 mmHg LVOT Vmax:         99.20 cm/s LVOT Vmean:        69.300 cm/s LVOT VTI:          0.211 m LVOT/AV VTI ratio: 0.59  AORTA Ao Root diam: 3.70 cm MITRAL VALVE                TRICUSPID VALVE MV Area (PHT): 2.52 cm     TR Peak grad:   17.6 mmHg MV Decel Time: 301 msec     TR Vmax:        210.00 cm/s MV E velocity: 75.80 cm/s MV A velocity: 101.00 cm/s  SHUNTS MV E/A ratio:  0.75         Systemic VTI:  0.21 m                             Systemic Diam: 2.50 cm Bartholome Bill MD Electronically signed by Bartholome Bill MD Signature Date/Time: 02/28/2021/7:56:05 AM    Final     EKG: Sinus rhythm PACs evidence of  heart block rate of 75  ASSESSMENT AND PLAN:  Sick sinus syndrome Complete heart block Symptomatic bradycardia History of diverticulitis status post Hartman's pouch Hyperlipidemia  Obesity Obstructive sleep apnea . Plan Agree with admit to telemetry rule out myocardial infarction Continue telemetry for arrhythmias Referral for permanent pacemaker probably micro Continue CPAP for obstructive sleep apnea Consider cardiac cath for possible coronary disease Hypertension management and control amlodipne Dyazide Inhalers for asthma type symptoms shortness of breath GERD continue Protonix therapy for reflux type symptoms     Signed: Sonjia Wilcoxson  D Chaia Ikard MD 03/21/2021, 12:18 PM

## 2021-03-21 NOTE — ED Notes (Signed)
See triage note  Presents with dizziness states he had a syncopal episode at home while sitting  Is currently wearing a hear monitor  Was told to come to ED by the monitor staff

## 2021-03-21 NOTE — Progress Notes (Addendum)
Pt was admitted on the floor with no isngs of distress. Pt aler x 4. VSS. Pt was educated about safety and ascome within pt reach. Will continue to monitor.  Update 0625: Coralyn Mark from [facility] was given report on pt.

## 2021-03-21 NOTE — ED Provider Notes (Signed)
[facility] Emergency Department Provider Note    Event Date/Time   First MD Initiated Contact with Patient 03/21/21 [phone]     (approximate)  I have reviewed the triage vital signs and the nursing notes.   HISTORY  Chief Complaint Dizziness    HPI Tyrone Luna is a 75 y.o. male with below listed past medical history presents to the ER for evaluation of syncopal episodes.  Patient has Holter monitor in place and is followed with cardiology with plan for left heart cath next week.  States that this morning was sitting on couch reportedly lost consciousness though is not certain as to how long it lasted was reportedly diaphoretic associated nausea vomiting.  Is denying any pain.  He pressed the button on his monitor and company called him telling him to come to the ER for evaluation.  Past Medical History:  Diagnosis Date   Arthritis    BPH associated with nocturia    Cancer ([place])    SKIN CA-BASAL AND SQUAMOUS    ED (erectile dysfunction)    Erectile dysfunction    Family history of adverse reaction to anesthesia    SISTER-N/V   Hyperlipemia    Hypertension    Male hypogonadism    Sleep apnea    H/O HAD SURGERY-UUU   Urinary frequency    Urinary urgency    Family History  Problem Relation Age of Onset   Hypertension Other    Prostate cancer Neg Hx    Bladder Cancer Neg Hx    Kidney cancer Neg Hx    Past Surgical History:  Procedure Laterality Date   ABDOMINAL WALL DEFECT REPAIR  10/13/2019   Procedure: REPAIR ABDOMINAL WALL (ABDOMINAL WALL RECONSTRUCTION WITH MESH);  Surgeon: Jules Husbands, MD;  Location: [facility] ORS;  Service: General;;   APPENDECTOMY  10/13/2019   Procedure: APPENDECTOMY;  Surgeon: Jules Husbands, MD;  Location: [facility] ORS;  Service: General;;   APPLICATION OF WOUND VAC  10/13/2019   Procedure: APPLICATION OF WOUND VAC;  Surgeon: Jules Husbands, MD;  Location: [facility] ORS;  Service: General;;  SERIAL NUMBERBT:[id number]    COLONOSCOPY WITH PROPOFOL N/A 07/17/2019   Procedure: COLONOSCOPY WITH PROPOFOL;  Surgeon: [name], MD;  Location: [facility] ENDOSCOPY;  Service: Gastroenterology;  Laterality: N/A;   COLOSTOMY REVERSAL N/A 10/13/2019   Procedure: COLOSTOMY REVERSAL-colostomy takedown-open;  Surgeon: Jules Husbands, MD;  Location: [facility] ORS;  Service: General;  Laterality: N/A;   EYE SURGERY Bilateral 2002   laser   INGUINAL HERNIA REPAIR Left    KNEE SURGERY Right 1995   LAPAROTOMY N/A 03/15/2019   Procedure: EXPLORATORY LAPAROTOMY,sigmoid colectomy,colostomy;  Surgeon: Jules Husbands, MD;  Location: [facility] ORS;  Service: General;  Laterality: N/A;   UVULOPALATOPHARYNGOPLASTY (UPPP)/TONSILLECTOMY/SEPTOPLASTY     Patient Active Problem List   Diagnosis Date Noted   Syncope 02/26/2021   AKI (acute kidney injury) ([place]) 02/26/2021   Hypokalemia 02/26/2021   S/P colostomy takedown 10/13/2019   Wound infection after surgery    Wound infection 03/28/2019   Diverticulitis large intestine 03/12/2019   Morbid obesity due to excess calories ([place]) 05/18/2015   BPH with obstruction/lower urinary tract symptoms 05/05/2015   Nocturia 05/05/2015   Erectile dysfunction of organic origin 05/05/2015   ED (erectile dysfunction) of organic origin 03/22/2015   Allergy to environmental factors 03/22/2015   Hyperlipidemia, unspecified 03/22/2015   BP (high blood pressure) 03/22/2015   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 Authorizing Provider  amLODipine (NORVASC) 10 MG tablet Take 10 mg by mouth daily.    [provider]  aspirin 81 MG tablet Take 81 mg by mouth daily.    [provider]  dutasteride (AVODART) 0.5 MG capsule Take 1 capsule (0.5 mg total) by mouth every evening. 02/28/21   Terrilee Croak, MD  Glucosamine-Chondroit-Vit C-Mn (GLUCOSAMINE 1500 COMPLEX PO) Take 1 capsule by mouth daily.     [provider]  ipratropium (ATROVENT) 0.06 % nasal spray Place 2 sprays into both nostrils 3 (three) times daily as needed for congestion or allergies.    [provider]  loratadine (CLARITIN) 10 MG tablet Take 10 mg by mouth daily.    [provider]  Multiple Vitamins-Minerals (PRESERVISION AREDS 2 PO) Take 1 capsule by mouth 2 (two) times daily.    [provider]  pantoprazole (PROTONIX) 40 MG tablet Take 40 mg by mouth 2 (two) times daily.    [provider]  pilocarpine (SALAGEN) 5 MG tablet Take 5 mg by mouth 2 (two) times daily.  03/18/15   [provider]  simvastatin (ZOCOR) 20 MG tablet Take 20 mg by mouth every evening.    [provider]  triamterene-hydrochlorothiazide (DYAZIDE) 37.5-25 MG per capsule Take 1 capsule by mouth daily.  03/18/15   [provider]    Allergies Patient has no known allergies.    Social History Social History   Tobacco Use   Smoking status: Former    Packs/day: 0.50    Years: 3.00    Pack years: 1.50    Types: Cigarettes   Smokeless tobacco: Never   Tobacco comments:    quit 50 years ago  Vaping Use   Vaping Use: Never used  Substance Use Topics   Alcohol use: No    Alcohol/week: 0.0 standard drinks   Drug use: No    Review of Systems Patient denies headaches, rhinorrhea, blurry vision, numbness, shortness of breath, chest pain, edema, cough, abdominal pain, nausea, vomiting, diarrhea, dysuria, fevers, rashes or hallucinations unless otherwise stated above in HPI. ____________________________________________   PHYSICAL EXAM:  VITAL SIGNS: Vitals:   03/21/21 1039 03/21/21 1100  BP:  (!) 146/64  Pulse:  67  Resp:  18  Temp: 98.6 F (37 C)   SpO2:  97%    Constitutional: Alert and oriented.  Eyes: Conjunctivae are normal.  Head: Atraumatic. Nose: No congestion/rhinnorhea. Mouth/Throat: Mucous membranes are moist.   Neck: No stridor. Painless ROM.   Cardiovascular: Normal rate, regular rhythm. Grossly normal heart sounds.  Good peripheral circulation. Respiratory: Normal respiratory effort.  No retractions. Lungs CTAB. Gastrointestinal: Soft and nontender. No distention. No abdominal bruits. No CVA tenderness. Genitourinary:  Musculoskeletal: No lower extremity tenderness nor edema.  No joint effusions. Neurologic:  Normal speech and language. No gross focal neurologic deficits are appreciated. No facial droop Skin:  Skin is warm, dry and intact. No rash noted. Psychiatric: Mood and affect are normal. Speech and behavior are normal.  ____________________________________________   LABS (all labs ordered are listed, but only abnormal results are displayed)  Results for orders placed or performed during the hospital encounter of 03/21/21 (from the past 24 hour(s))  CBC with Differential     Status: None   Collection Time: 03/21/21  6:23 AM  Result Value Ref Range   WBC 7.1 4.0 - 10.5 K/uL   RBC 5.20 4.22 - 5.81  MIL/uL   Hemoglobin 16.7 13.0 - 17.0 g/dL   HCT 47.5 39.0 - 52.0 %   MCV 91.3 80.0 - 100.0 fL   MCH 32.1 26.0 - 34.0 pg   MCHC 35.2 30.0 - 36.0 g/dL   RDW 13.1 11.5 - 15.5 %   Platelets 154 150 - 400 K/uL   nRBC 0.0 0.0 - 0.2 %   Neutrophils Relative % 68 %   Neutro Abs 4.8 1.7 - 7.7 K/uL   Lymphocytes Relative 18 %   Lymphs Abs 1.3 0.7 - 4.0 K/uL   Monocytes Relative 10 %   Monocytes Absolute 0.7 0.1 - 1.0 K/uL   Eosinophils Relative 3 %   Eosinophils Absolute 0.2 0.0 - 0.5 K/uL   Basophils Relative 1 %   Basophils Absolute 0.1 0.0 - 0.1 K/uL   Immature Granulocytes 0 %   Abs Immature Granulocytes 0.03 0.00 - 0.07 K/uL  Comprehensive metabolic panel     Status: Abnormal   Collection Time: 03/21/21  6:23 AM  Result Value Ref Range   Sodium 140 135 - 145 mmol/L   Potassium 3.1 (L) 3.5 - 5.1 mmol/L   Chloride 105 98 - 111 mmol/L   CO2 25 22 - 32 mmol/L   Glucose, Bld 138 (H) 70 - 99 mg/dL   BUN 18 8 - 23 mg/dL    Creatinine, Ser 1.04 0.61 - 1.24 mg/dL   Calcium 9.0 8.9 - 10.3 mg/dL   Total Protein 6.6 6.5 - 8.1 g/dL   Albumin 4.1 3.5 - 5.0 g/dL   AST 69 (H) 15 - 41 U/L   ALT 52 (H) 0 - 44 U/L   Alkaline Phosphatase 34 (L) 38 - 126 U/L   Total Bilirubin 2.0 (H) 0.3 - 1.2 mg/dL   GFR, Estimated >60 >60 mL/min   Anion gap 10 5 - 15  Lipase, blood     Status: None   Collection Time: 03/21/21  6:23 AM  Result Value Ref Range   Lipase 25 11 - 51 U/L  Troponin I (High Sensitivity)     Status: None   Collection Time: 03/21/21  6:23 AM  Result Value Ref Range   Troponin I (High Sensitivity) 6 <18 ng/L  Urinalysis, Complete w Microscopic Urine, Random     Status: Abnormal   Collection Time: 03/21/21  9:41 AM  Result Value Ref Range   Color, Urine YELLOW (A) YELLOW   APPearance HAZY (A) CLEAR   Specific Gravity, Urine 1.011 1.005 - 1.030   pH 9.0 (H) 5.0 - 8.0   Glucose, UA NEGATIVE NEGATIVE mg/dL   Hgb urine dipstick NEGATIVE NEGATIVE   Bilirubin Urine NEGATIVE NEGATIVE   Ketones, ur 20 (A) NEGATIVE mg/dL   Protein, ur NEGATIVE NEGATIVE mg/dL   Nitrite NEGATIVE NEGATIVE   Leukocytes,Ua NEGATIVE NEGATIVE   RBC / HPF 0-5 0 - 5 RBC/hpf   WBC, UA 0-5 0 - 5 WBC/hpf   Bacteria, UA NONE SEEN NONE SEEN   Squamous Epithelial / LPF NONE SEEN 0 - 5  Resp Panel by RT-PCR (Flu A&B, Covid) Nasopharyngeal Swab     Status: None   Collection Time: 03/21/21  9:41 AM   Specimen: Nasopharyngeal Swab; Nasopharyngeal(NP) swabs in vial transport medium  Result Value Ref Range   SARS Coronavirus 2 by RT PCR NEGATIVE NEGATIVE   Influenza A by PCR NEGATIVE NEGATIVE   Influenza B by PCR NEGATIVE NEGATIVE  Troponin I (High Sensitivity)     Status: None  Collection Time: 03/21/21 10:28 AM  Result Value Ref Range   Troponin I (High Sensitivity) 14 <18 ng/L   ____________________________________________  EKG My review and personal interpretation at Time: 6:22   Indication: syncope  Rate: 60  Rhythm: first  degree av Axis: left Other: lbbb, pvc, abnml ekg ____________________________________________  RADIOLOGY  I personally reviewed all radiographic images ordered to evaluate for the above acute complaints and reviewed radiology reports and findings.  These findings were personally discussed with the patient.  Please see medical record for radiology report.  ____________________________________________   PROCEDURES  Procedure(s) performed:  Procedures    Critical Care performed: no ____________________________________________   INITIAL IMPRESSION / ASSESSMENT AND PLAN / ED COURSE  Pertinent labs & imaging results that were available during my care of the patient were reviewed by me and considered in my medical decision making (see chart for details).   DDX: Dehydration, electrolyte abnormality, dysrhythmia, ACS, seizure   Tyrone Luna is a 75 y.o. who presents to the ED with presentation as described above.  Given history recent work-up and presentation suspect cardiogenic syncope.  Reportedly had event on Holter monitor was called to come directly to the ER.  Will obtain results.  We will check blood work.  He appears well and is hemodynamically stable on arrival.  The patient will be placed on continuous pulse oximetry and telemetry for monitoring.  Laboratory evaluation will be sent to evaluate for the above complaints.     Clinical Course as of 03/21/21 1156  Tue Mar 21, 2021  1150 Report from Holter monitor shows that patient was in third-degree heart block when he had his syncopal episode this morning.  He remains chest pain-free but given his presentation these findings were discussed with hospitalist for admission and further work-up with cardiology.  Have discussed with the patient and available family all diagnostics and treatments performed thus far and all questions were answered to the best of my ability. The patient demonstrates understanding and agreement with  plan.   [PR]    Clinical Course User Index [PR] Merlyn Lot, MD    The patient was evaluated in Emergency Department today for the symptoms described in the history of present illness. He/she was evaluated in the context of the global COVID-19 pandemic, which necessitated consideration that the patient might be at risk for infection with the SARS-CoV-2 virus that causes COVID-19. Institutional protocols and algorithms that pertain to the evaluation of patients at risk for COVID-19 are in a state of rapid change based on information released by regulatory bodies including the CDC and federal and state organizations. These policies and algorithms were followed during the patient's care in the ED.  As part of my medical decision making, I reviewed the following data within the [facility] notes reviewed and incorporated, Labs reviewed, notes from prior ED visits and [place] Controlled Substance Database   ____________________________________________   FINAL CLINICAL IMPRESSION(S) / ED DIAGNOSES  Final diagnoses:  Syncope, unspecified syncope type  Third degree AV block ([place])      NEW MEDICATIONS STARTED DURING THIS VISIT:  New Prescriptions   No medications on file     Note:  This document was prepared using Dragon voice recognition software and may include unintentional dictation errors.    Merlyn Lot, MD 03/21/21 [phone]

## 2021-03-21 NOTE — ED Notes (Signed)
Report given to Abram Sander RN

## 2021-03-22 ENCOUNTER — Encounter
Admission: EM | Disposition: A | Discharge status: Home / Self Care | Payer: Self-pay | Source: Home / Self Care | Attending: Internal Medicine

## 2021-03-22 ENCOUNTER — Encounter: Payer: Self-pay | Admitting: Cardiology

## 2021-03-22 DIAGNOSIS — Z950 Presence of cardiac pacemaker: Secondary | ICD-10-CM

## 2021-03-22 DIAGNOSIS — R55 Syncope and collapse: Secondary | ICD-10-CM | POA: Diagnosis not present

## 2021-03-22 DIAGNOSIS — I442 Atrioventricular block, complete: Secondary | ICD-10-CM

## 2021-03-22 HISTORY — DX: Presence of cardiac pacemaker: Z95.0

## 2021-03-22 HISTORY — PX: PACEMAKER LEADLESS INSERTION: EP1219

## 2021-03-22 LAB — BASIC METABOLIC PANEL
Anion gap: 7 (ref 5–15)
BUN: 18 mg/dL (ref 8–23)
CO2: 26 mmol/L (ref 22–32)
Calcium: 9.2 mg/dL (ref 8.9–10.3)
Chloride: 106 mmol/L (ref 98–111)
Creatinine, Ser: 0.98 mg/dL (ref 0.61–1.24)
GFR, Estimated: >60 mL/min (ref >60)
Glucose, Bld: 100 mg/dL — ABNORMAL HIGH (ref 70–99)
Potassium: 3.8 mmol/L (ref 3.5–5.1)
Sodium: 139 mmol/L (ref 135–145)

## 2021-03-22 LAB — CBC
HCT: 46.6 % (ref 39.0–52.0)
Hemoglobin: 16.3 g/dL (ref 13.0–17.0)
MCH: 31.8 pg (ref 26.0–34.0)
MCHC: 35 g/dL (ref 30.0–36.0)
MCV: 90.8 fL (ref 80.0–100.0)
Platelets: 163 10*3/uL (ref 150–400)
RBC: 5.13 MIL/uL (ref 4.22–5.81)
RDW: 13 % (ref 11.5–15.5)
WBC: 6.1 10*3/uL (ref 4.0–10.5)
nRBC: 0 % (ref 0.0–0.2)

## 2021-03-22 SURGERY — PACEMAKER LEADLESS INSERTION
Anesthesia: Moderate Sedation

## 2021-03-22 MED ORDER — SODIUM CHLORIDE 0.9% FLUSH
3.0000 mL | Freq: Two times a day (BID) | INTRAVENOUS | Status: DC
  Administered 2021-03-22 – 2021-03-23 (×3): 3 mL via INTRAVENOUS

## 2021-03-22 MED ORDER — FENTANYL CITRATE (PF) 100 MCG/2ML IJ SOLN
INTRAMUSCULAR | Status: AC
  Filled 2021-03-22: qty 2

## 2021-03-22 MED ORDER — LIDOCAINE HCL (PF) 1 % IJ SOLN
INTRAMUSCULAR | Status: AC
  Filled 2021-03-22: qty 30

## 2021-03-22 MED ORDER — HEPARIN SODIUM (PORCINE) 1000 UNIT/ML IJ SOLN
INTRAMUSCULAR | Status: DC | PRN
  Administered 2021-03-22: 4500 [IU] via INTRAVENOUS

## 2021-03-22 MED ORDER — ACETAMINOPHEN 325 MG PO TABS: 650.0000 mg | ORAL_TABLET | ORAL | Status: DC | PRN

## 2021-03-22 MED ORDER — MIDAZOLAM HCL 2 MG/2ML IJ SOLN
INTRAMUSCULAR | Status: DC | PRN
  Administered 2021-03-22: 1 mg via INTRAVENOUS

## 2021-03-22 MED ORDER — HEPARIN (PORCINE) IN NACL 2000-0.9 UNIT/L-% IV SOLN
INTRAVENOUS | Status: DC | PRN
  Administered 2021-03-22: 1000 mL

## 2021-03-22 MED ORDER — IOHEXOL 300 MG/ML  SOLN
INTRAMUSCULAR | Status: DC | PRN
  Administered 2021-03-22: 10 mL

## 2021-03-22 MED ORDER — ONDANSETRON HCL 4 MG/2ML IJ SOLN: 4.0000 mg | Freq: Four times a day (QID) | INTRAMUSCULAR | Status: DC | PRN

## 2021-03-22 MED ORDER — HEPARIN SODIUM (PORCINE) 1000 UNIT/ML IJ SOLN
INTRAMUSCULAR | Status: AC
  Filled 2021-03-22: qty 1

## 2021-03-22 MED ORDER — MIDAZOLAM HCL 2 MG/2ML IJ SOLN
INTRAMUSCULAR | Status: AC
  Filled 2021-03-22: qty 2

## 2021-03-22 MED ORDER — SODIUM CHLORIDE 0.9 % IV SOLN: 250.0000 mL | INTRAVENOUS | Status: DC | PRN

## 2021-03-22 MED ORDER — SODIUM CHLORIDE 0.9% FLUSH: 3.0000 mL | INTRAVENOUS | Status: DC | PRN

## 2021-03-22 MED ORDER — LIDOCAINE HCL (PF) 1 % IJ SOLN
INTRAMUSCULAR | Status: DC | PRN
  Administered 2021-03-22: 30 mL
  Administered 2021-03-22: 2 mL

## 2021-03-22 MED ORDER — FENTANYL CITRATE (PF) 100 MCG/2ML IJ SOLN
INTRAMUSCULAR | Status: DC | PRN
  Administered 2021-03-22: 25 ug via INTRAVENOUS

## 2021-03-22 SURGICAL SUPPLY — 16 items
CABLE ADAPT PACING TEMP 12FT (ADAPTER) ×2 IMPLANT
CATH S G BIP PACING (CATHETERS) ×2 IMPLANT
DILATOR VESSEL 38 20CM 12FR (INTRODUCER) ×2 IMPLANT
DILATOR VESSEL 38 20CM 14FR (INTRODUCER) ×2 IMPLANT
DILATOR VESSEL 38 20CM 18FR (INTRODUCER) ×2 IMPLANT
DILATOR VESSEL 38 20CM 8FR (INTRODUCER) ×2 IMPLANT
DRAPE BRACHIAL (DRAPES) ×2 IMPLANT
MICRA AV TRANSCATH PACING SYS (Pacemaker) ×2 IMPLANT
MICRA INTRODUCER SHEATH (SHEATH) ×2
NEEDLE PERC 18GX7CM (NEEDLE) ×2 IMPLANT
PACK CARDIAC CATH (CUSTOM PROCEDURE TRAY) ×2 IMPLANT
SHEATH AVANTI 6FR X 11CM (SHEATH) ×2 IMPLANT
SHEATH AVANTI 7FRX11 (SHEATH) ×2 IMPLANT
SHEATH INTRODUCER MICRA (SHEATH) ×1 IMPLANT
SYSTEM PACING TRNSCTH AV MICRA (Pacemaker) ×1 IMPLANT
WIRE AMPLATZ SS-J .035X180CM (WIRE) ×2 IMPLANT

## 2021-03-22 NOTE — Progress Notes (Signed)
[facility] Cardiology    SUBJECTIVE: Patient feels reasonably well denies any chest pain no further episodes of syncope preop for permanent pacemaker   Vitals:   03/21/21 2026 03/22/21 0000 03/22/21 0508 03/22/21 0730  BP: (!) 154/76 132/72 (!) 160/78 (!) 153/90  Pulse:    (!) 41  Resp:   18 17  Temp: 98.3 F (36.8 C) 98.2 F (36.8 C) 97.9 F (36.6 C) 97.9 F (36.6 C)  TempSrc: Oral Oral Oral Oral  SpO2: 98% 98% 99% 98%  Weight: 98.9 kg  98.8 kg   Height: 6' (1.829 m)        Intake/Output Summary (Last 24 hours) at 03/22/2021 0751 Last data filed at 03/22/2021 0700 Gross per 24 hour  Intake 359.57 ml  Output 510 ml  Net -150.43 ml      PHYSICAL EXAM  General: Well developed, well nourished, in no acute distress HEENT:  Normocephalic and atramatic Neck:  No JVD.  Lungs: Clear bilaterally to auscultation and percussion. Heart: HRRR . Normal S1 and S2 without gallops or murmurs.  Abdomen: Bowel sounds are positive, abdomen soft and non-tender  Msk:  Back normal, normal gait. Normal strength and tone for age. Extremities: No clubbing, cyanosis or edema.   Neuro: Alert and oriented X 3. Psych:  Good affect, responds appropriately   LABS: Basic Metabolic Panel: Recent Labs    03/21/21 1028 03/21/21 1508 03/21/21 2023 03/22/21 0610  NA  --   --  139 139  K  --   --  3.7 3.8  CL  --   --  106 106  CO2  --   --  26 26  GLUCOSE  --   --  106* 100*  BUN  --   --  18 18  CREATININE  --    < > 1.06 0.98  CALCIUM  --   --  9.1 9.2  MG 2.5*  --   --   --    < > = values in this interval not displayed.   Liver Function Tests: Recent Labs    03/21/21 0623  AST 69*  ALT 52*  ALKPHOS 34*  BILITOT 2.0*  PROT 6.6  ALBUMIN 4.1   Recent Labs    03/21/21 0623  LIPASE 25   CBC: Recent Labs    03/21/21 0623 03/21/21 1508 03/22/21 0610  WBC 7.1 7.1 6.1  NEUTROABS 4.8  --   --   HGB 16.7 16.4 16.3  HCT 47.5 47.2 46.6  MCV 91.3 90.8 90.8  PLT 154 169 163    Cardiac Enzymes: No results for input(s): CKTOTAL, CKMB, CKMBINDEX, TROPONINI in the last 72 hours. BNP: Invalid input(s): POCBNP D-Dimer: No results for input(s): DDIMER in the last 72 hours. Hemoglobin A1C: No results for input(s): HGBA1C in the last 72 hours. Fasting Lipid Panel: No results for input(s): CHOL, HDL, LDLCALC, TRIG, CHOLHDL, LDLDIRECT in the last 72 hours. Thyroid Function Tests: Recent Labs    03/21/21 1508  TSH 0.472   Anemia Panel: No results for input(s): VITAMINB12, FOLATE, FERRITIN, TIBC, IRON, RETICCTPCT in the last 72 hours.  DG Chest Portable 1 View  Result Date: 03/21/2021 CLINICAL DATA:  75 year old male with a history of syncope EXAM: PORTABLE CHEST 1 VIEW COMPARISON:  02/26/2021 FINDINGS: Cardiomediastinal silhouette unchanged in size and contour with tortuosity of the thoracic aorta. Coarsened interstitial markings of the lungs, similar to the prior. No pneumothorax or pleural effusion. Linear opacities at the bilateral lung bases. Radiopaque device  projecting over the lower thoracic spine in the midline, presumed event recorder. No new confluent airspace disease. No displaced fracture IMPRESSION: Chronic lung changes without evidence of acute cardiopulmonary disease. Presumed event recorder projecting over the midline lower chest. Electronically Signed   By: Corrie Mckusick D.O.   On: 03/21/2021 10:34     Echo preserved left ventricular function around 60%  TELEMETRY: Sinus bradycardia rate in the 60s:  ASSESSMENT AND PLAN:  Active Problems:   Syncope Sick sinus syndrome Complete heart block Symptomatic bradycardia Hypertension Obesity PVCs  Plan Continue telemetry therapy Recommend permanent pacemaker prior to discharge Recommend beta-blockade therapy once permanent pacemaker in place Patient may still need EP evaluation for other arrhythmias and recurrent PVCs Continue amlodipine for blood pressure control consider adding metoprolol  after the pacemaker in place and/or ACE or ARB as well  Yolonda Kida, MD 03/22/2021 7:51 AM

## 2021-03-22 NOTE — Plan of Care (Signed)

## 2021-03-22 NOTE — Progress Notes (Signed)
[facility] [place] NAME: Tyrone Luna    MR#:  [id number]  DATE OF BIRTH:  04-30-1946  SUBJECTIVE:   patient seen earlier. Eating lunch. Doing well after pacemaker placement. Denies any complaints. Heart rate sinus rhythm 60-70 REVIEW OF SYSTEMS:   Review of Systems  Constitutional:  Negative for chills, fever and weight loss.  HENT:  Negative for ear discharge, ear pain and nosebleeds.   Eyes:  Negative for blurred vision, pain and discharge.  Respiratory:  Negative for sputum production, shortness of breath, wheezing and stridor.   Cardiovascular:  Negative for chest pain, palpitations, orthopnea and PND.  Gastrointestinal:  Negative for abdominal pain, diarrhea, nausea and vomiting.  Genitourinary:  Negative for frequency and urgency.  Musculoskeletal:  Negative for back pain and joint pain.  Neurological:  Negative for sensory change, speech change, focal weakness and weakness.  Psychiatric/Behavioral:  Negative for depression and hallucinations. The patient is not nervous/anxious.   Tolerating Diet:yes Tolerating PT: HHPT  DRUG ALLERGIES:  No Known Allergies  VITALS:  Blood pressure (!) 147/77, pulse 99, temperature (!) 97.5 F (36.4 C), resp. rate 18, height 6' (1.829 m), weight 98.8 kg, SpO2 97 %.  PHYSICAL EXAMINATION:   Physical Exam  GENERAL:  75 y.o.-year-old patient lying in the bed with no acute distress.  LUNGS: Normal breath sounds bilaterally, no wheezing, rales, rhonchi. No use of accessory muscles of respiration.  CARDIOVASCULAR: S1, S2 normal. No murmurs, rubs, or gallops.  ABDOMEN: Soft, nontender, nondistended. Bowel sounds present. No organomegaly or mass.  EXTREMITIES: No cyanosis, clubbing or edema b/l.    NEUROLOGIC: nonfocal PSYCHIATRIC:  patient is alert and oriented x 3.  SKIN: No obvious rash, lesion, or ulcer.   LABORATORY PANEL:  CBC Recent Labs  Lab 03/22/21 0610  WBC 6.1  HGB 16.3   HCT 46.6  PLT 163    Chemistries  Recent Labs  Lab 03/21/21 0623 03/21/21 1028 03/21/21 1508 03/22/21 0610  NA 140  --    < > 139  K 3.1*  --    < > 3.8  CL 105  --    < > 106  CO2 25  --    < > 26  GLUCOSE 138*  --    < > 100*  BUN 18  --    < > 18  CREATININE 1.04  --    < > 0.98  CALCIUM 9.0  --    < > 9.2  MG  --  2.5*  --   --   AST 69*  --   --   --   ALT 52*  --   --   --   ALKPHOS 34*  --   --   --   BILITOT 2.0*  --   --   --    < > = values in this interval not displayed.   Cardiac Enzymes No results for input(s): TROPONINI in the last 168 hours. RADIOLOGY:  EP PPM/ICD IMPLANT  Result Date: 03/22/2021 Successful Micra AV leadless pacemaker implantation   DG Chest Portable 1 View  Result Date: 03/21/2021 CLINICAL DATA:  75 year old male with a history of syncope EXAM: PORTABLE CHEST 1 VIEW COMPARISON:  02/26/2021 FINDINGS: Cardiomediastinal silhouette unchanged in size and contour with tortuosity of the thoracic aorta. Coarsened interstitial markings of the lungs, similar to the prior. No pneumothorax or pleural effusion. Linear opacities at the bilateral lung bases. Radiopaque device projecting over  the lower thoracic spine in the midline, presumed event recorder. No new confluent airspace disease. No displaced fracture IMPRESSION: Chronic lung changes without evidence of acute cardiopulmonary disease. Presumed event recorder projecting over the midline lower chest. Electronically Signed   By: Corrie Mckusick D.O.   On: 03/21/2021 10:34   ASSESSMENT AND PLAN:   Tyrone Luna is a 75 y.o. male with medical history significant for  hypertension, hyperlipidemia, history of perforated diverticulitis status post Hartmann procedure, history of Hartmann takedown, OSA s/p surgical treatment, BPH on alpha blockade, as well as history of recurrent syncopal episodes that began on 6/21 w/o prodrome , no associated chest pain, or palpitations but s/p episodes patient has  felt diaphoretic and has had feeling of nausea /emesis.  Patient has had total of 4 episodes since onset of symptoms on 6/21.  Recurrent syncopal episode secondary to sick sinus syndrome -- patient admitted and seen by Dr. Clayborn Bigness -- status post pacemaker placement by Dr. Saralyn Pilar 03/22/2021 -- overall doing well.  Hypertension  --continue Norvasc  BPH --cont dutaseride  mild hypokalemia -- improved  hyperlipidemia continue statins  Tyrone Luna continue PPI  obstructive sleep apnea -- patient underwent surgery in the past  Procedures: pacemaker placement Family communication : none Consults : cardiology Dr. Clayborn Bigness CODE STATUS: full DVT Prophylaxis :scd Level of care: Progressive Cardiac Status is: Inpatient  Remains inpatient appropriate because:Ongoing diagnostic testing needed not appropriate for outpatient work up  Dispo: The patient is from: Home              Anticipated d/c is to: Home              Patient currently is medically stable to d/c.   Difficult to place patient No   patient is status post pacemaker placement today. Will monitor for one more day remains stable will discharge tomorrow.     TOTAL TIME TAKING CARE OF THIS PATIENT: 25 minutes.  >50% time spent on counselling and coordination of care  Note: This dictation was prepared with Dragon dictation along with smaller phrase technology. Any transcriptional errors that result from this process are unintentional.  Fritzi Mandes M.D    [facility]   CC: Primary care physician; [name], MD Patient ID: Tyrone Luna, male   DOB: 1946-02-20, 75 y.o.   MRN: [id number]

## 2021-03-23 DIAGNOSIS — I442 Atrioventricular block, complete: Secondary | ICD-10-CM | POA: Diagnosis not present

## 2021-03-23 DIAGNOSIS — I10 Essential (primary) hypertension: Secondary | ICD-10-CM | POA: Diagnosis not present

## 2021-03-23 DIAGNOSIS — Z950 Presence of cardiac pacemaker: Secondary | ICD-10-CM | POA: Diagnosis not present

## 2021-03-23 DIAGNOSIS — R55 Syncope and collapse: Secondary | ICD-10-CM | POA: Diagnosis not present

## 2021-03-23 NOTE — Discharge Summary (Signed)
[facility] NAME: Tyrone Luna    MR#:  [id number]  DATE OF BIRTH:  June 01, 1946  DATE OF ADMISSION:  03/21/2021 ADMITTING PHYSICIAN: Clance Boll, MD  DATE OF DISCHARGE: 03/23/2021  PRIMARY CARE PHYSICIAN: [name], MD    ADMISSION DIAGNOSIS:  Syncope [R55] Third degree AV block ([place]) [I44.2] Syncope, unspecified syncope type [R55]  DISCHARGE DIAGNOSIS:  syncope secondary to vertigo AV block/sick sinus syndrome/complete heart block status post pacemaker placement  SECONDARY DIAGNOSIS:   Past Medical History:  Diagnosis Date  . Arthritis   . BPH associated with nocturia   . Cancer ([place])    SKIN CA-BASAL AND SQUAMOUS   . ED (erectile dysfunction)   . Erectile dysfunction   . Family history of adverse reaction to anesthesia    SISTER-N/V  . Hyperlipemia   . Hypertension   . Male hypogonadism   . Sleep apnea    H/O HAD SURGERY-UUU  . Urinary frequency   . Urinary urgency     HOSPITAL COURSE:   Tyrone Luna is a 75 y.o. male with medical history significant for  hypertension, hyperlipidemia, history of perforated diverticulitis status post Hartmann procedure, history of Hartmann takedown, OSA s/p surgical treatment, BPH on alpha blockade, as well as history of recurrent syncopal episodes that began on 6/21 w/o prodrome , no associated chest pain, or palpitations but s/p episodes patient has felt diaphoretic and has had feeling of nausea /emesis.  Patient has had total of 4 episodes since onset of symptoms on 6/21.   Recurrent syncopal episode secondary to sick sinus syndrome -- patient admitted and seen by Dr. Clayborn Bigness -- status post pacemaker placement by Dr. Saralyn Pilar 03/22/2021 -- overall doing well. --HR in 60-70's -- patient is scheduled for cardiac cath next week per cardiology.   Hypertension  --continue Norvasc   BPH --cont dutaseride   mild hypokalemia -- improved    hyperlipidemia continue statins   Jerrye Bushy continue PPI   obstructive sleep apnea -- patient underwent surgery in the past   Procedures: pacemaker placement Family communication : none Consults : cardiology Dr. Clayborn Bigness and Dr. Saralyn Pilar CODE STATUS: full DVT Prophylaxis :scd Level of care: Progressive Cardiac Status is: Inpatient    Dispo: The patient is from: Home              Anticipated d/c is to: Home              Patient currently is medically stable to d/c.              Difficult to place patient No CONSULTS OBTAINED:  Treatment Team:  Isaias Cowman, MD  DRUG ALLERGIES:  No Known Allergies  DISCHARGE MEDICATIONS:   Allergies as of 03/23/2021   No Known Allergies      Medication List     STOP taking these medications    triamterene-hydrochlorothiazide 37.5-25 MG capsule Commonly known as: DYAZIDE       TAKE these medications    amLODipine 10 MG tablet Commonly known as: NORVASC Take 10 mg by mouth daily.   aspirin 81 MG tablet Take 81 mg by mouth daily.   dutasteride 0.5 MG capsule Commonly known as: AVODART Take 1 capsule (0.5 mg total) by mouth every evening.   GLUCOSAMINE 1500 COMPLEX PO Take 1 capsule by mouth daily.   ipratropium 0.06 % nasal spray Commonly known as: ATROVENT Place 2 sprays into both nostrils 3 (three) times daily as  needed for congestion or allergies.   loratadine 10 MG tablet Commonly known as: CLARITIN Take 10 mg by mouth daily.   pantoprazole 40 MG tablet Commonly known as: PROTONIX Take 40 mg by mouth 2 (two) times daily.   pilocarpine 5 MG tablet Commonly known as: SALAGEN Take 5 mg by mouth 2 (two) times daily.   PRESERVISION AREDS 2 PO Take 1 capsule by mouth 2 (two) times daily.   simvastatin 20 MG tablet Commonly known as: ZOCOR Take 20 mg by mouth every evening.        If you experience worsening of your admission symptoms, develop shortness of breath, life threatening emergency,  suicidal or homicidal thoughts you must seek medical attention immediately by calling 911 or calling your MD immediately  if symptoms less severe.  You Must read complete instructions/literature along with all the possible adverse reactions/side effects for all the Medicines you take and that have been prescribed to you. Take any new Medicines after you have completely understood and accept all the possible adverse reactions/side effects.   Please note  You were cared for by a hospitalist during your hospital stay. If you have any questions about your discharge medications or the care you received while you were in the hospital after you are discharged, you can call the unit and asked to speak with the hospitalist on call if the hospitalist that took care of you is not available. Once you are discharged, your primary care physician will handle any further medical issues. Please note that NO REFILLS for any discharge medications will be authorized once you are discharged, as it is imperative that you return to your primary care physician (or establish a relationship with a primary care physician if you do not have one) for your aftercare needs so that they can reassess your need for medications and monitor your lab values. Today   SUBJECTIVE   no new complaints  VITAL SIGNS:  Blood pressure (!) 143/76, pulse (!) 53, temperature 98.2 F (36.8 C), resp. rate 16, height 6' (1.829 m), weight 100.6 kg, SpO2 96 %.  I/O:   Intake/Output Summary (Last 24 hours) at 03/23/2021 0805 Last data filed at 03/23/2021 0347 Gross per 24 hour  Intake 480 ml  Output 1385 ml  Net -905 ml    PHYSICAL EXAMINATION:  GENERAL:  75 y.o.-year-old patient lying in the bed with no acute distress.   LUNGS: Normal breath sounds bilaterally, no wheezing, rales,rhonchi or crepitation. No use of accessory muscles of respiration.  CARDIOVASCULAR: S1, S2 normal. No murmurs, rubs, or gallops.  ABDOMEN: Soft, non-tender,  non-distended. Bowel sounds present. No organomegaly or mass.  EXTREMITIES: No pedal edema, cyanosis, or clubbing.  NEUROLOGIC: nonfocal PSYCHIATRIC: The patient is alert and oriented x 3.  SKIN: No obvious rash, lesion, or ulcer.   DATA REVIEW:   CBC  Recent Labs  Lab 03/22/21 0610  WBC 6.1  HGB 16.3  HCT 46.6  PLT 163    Chemistries  Recent Labs  Lab 03/21/21 0623 03/21/21 1028 03/21/21 1508 03/22/21 0610  NA 140  --    < > 139  K 3.1*  --    < > 3.8  CL 105  --    < > 106  CO2 25  --    < > 26  GLUCOSE 138*  --    < > 100*  BUN 18  --    < > 18  CREATININE 1.04  --    < >  0.98  CALCIUM 9.0  --    < > 9.2  MG  --  2.5*  --   --   AST 69*  --   --   --   ALT 52*  --   --   --   ALKPHOS 34*  --   --   --   BILITOT 2.0*  --   --   --    < > = values in this interval not displayed.    Microbiology Results   Recent Results (from the past 240 hour(s))  Resp Panel by RT-PCR (Flu A&B, Covid) Nasopharyngeal Swab     Status: None   Collection Time: 03/21/21  9:41 AM   Specimen: Nasopharyngeal Swab; Nasopharyngeal(NP) swabs in vial transport medium  Result Value Ref Range Status   SARS Coronavirus 2 by RT PCR NEGATIVE NEGATIVE Final    Comment: (NOTE) SARS-CoV-2 target nucleic acids are NOT DETECTED.  The SARS-CoV-2 RNA is generally detectable in upper respiratory specimens during the acute phase of infection. The lowest concentration of SARS-CoV-2 viral copies this assay can detect is 138 copies/mL. A negative result does not preclude SARS-Cov-2 infection and should not be used as the sole basis for treatment or other patient management decisions. A negative result may occur with  improper specimen collection/handling, submission of specimen other than nasopharyngeal swab, presence of viral mutation(s) within the areas targeted by this assay, and inadequate number of viral copies(<138 copies/mL). A negative result must be combined with clinical observations,  patient history, and epidemiological information. The expected result is Negative.  Fact Sheet for Patients:  EntrepreneurPulse.com.au  Fact Sheet for Healthcare Providers:  IncredibleEmployment.be  This test is no t yet approved or cleared by the [place] FDA and  has been authorized for detection and/or diagnosis of SARS-CoV-2 by FDA under an Emergency Use Authorization (EUA). This EUA will remain  in effect (meaning this test can be used) for the duration of the COVID-19 declaration under Section 564(b)(1) of the Act, 21 U.S.C.section 360bbb-3(b)(1), unless the authorization is terminated  or revoked sooner.       Influenza A by PCR NEGATIVE NEGATIVE Final   Influenza B by PCR NEGATIVE NEGATIVE Final    Comment: (NOTE) The Xpert Xpress SARS-CoV-2/FLU/RSV plus assay is intended as an aid in the diagnosis of influenza from Nasopharyngeal swab specimens and should not be used as a sole basis for treatment. Nasal washings and aspirates are unacceptable for Xpert Xpress SARS-CoV-2/FLU/RSV testing.  Fact Sheet for Patients: EntrepreneurPulse.com.au  Fact Sheet for Healthcare Providers: IncredibleEmployment.be  This test is not yet approved or cleared by the [place] FDA and has been authorized for detection and/or diagnosis of SARS-CoV-2 by FDA under an Emergency Use Authorization (EUA). This EUA will remain in effect (meaning this test can be used) for the duration of the COVID-19 declaration under Section 564(b)(1) of the Act, 21 U.S.C. section 360bbb-3(b)(1), unless the authorization is terminated or revoked.  Performed at [facility], [address] [place], [place] [address]     RADIOLOGY:  EP PPM/ICD IMPLANT  Result Date: 03/22/2021 Successful Micra AV leadless pacemaker implantation   DG Chest Portable 1 View  Result Date: 03/21/2021 CLINICAL DATA:  75 year old male  with a history of syncope EXAM: PORTABLE CHEST 1 VIEW COMPARISON:  02/26/2021 FINDINGS: Cardiomediastinal silhouette unchanged in size and contour with tortuosity of the thoracic aorta. Coarsened interstitial markings of the lungs, similar to the prior. No pneumothorax or pleural effusion. Linear  opacities at the bilateral lung bases. Radiopaque device projecting over the lower thoracic spine in the midline, presumed event recorder. No new confluent airspace disease. No displaced fracture IMPRESSION: Chronic lung changes without evidence of acute cardiopulmonary disease. Presumed event recorder projecting over the midline lower chest. Electronically Signed   By: Corrie Mckusick D.O.   On: 03/21/2021 10:34     CODE STATUS:     Code Status Orders  (From admission, onward)           Start     Ordered   03/21/21 1331  Full code  Continuous        03/21/21 1334           Code Status History     Date Active Date Inactive Code Status Order ID Comments User Context   02/26/2021 1526 02/28/2021 1806 Full Code [id number]  Criss Alvine, DO ED   10/13/2019 1707 10/19/2019 1826 Full Code [id number]  Jules Husbands, MD Inpatient   03/28/2019 1422 03/31/2019 1726 Full Code [id number]  Herbert Pun, MD ED   03/12/2019 1328 03/23/2019 2148 Full Code [id number]  Benjamine Sprague, DO ED      Advance Directive Documentation    Flowsheet Row Most Recent Value  Type of Advance Directive Healthcare Power of Attorney, Living will  Pre-existing out of facility DNR order (yellow form or pink MOST form) --  "MOST" Form in Place? --        TOTAL TIME TAKING CARE OF THIS PATIENT: 40 minutes.    Fritzi Mandes M.D  [facility]    CC: Primary care physician; [name], MD

## 2021-03-23 NOTE — Progress Notes (Signed)
Pt ambulate around nurses station without complaints. Discharge instructions explained/verbalized understanding. IV and tele removed. Awaiting ride, will transport off unit via wheelchair.

## 2021-03-23 NOTE — Discharge Instructions (Addendum)
Please avoid submerging yourself in water for the next week. If your bandage gets wet or starts to fall off, replace it with another piece of gauze and the Tegaderm (clear bandage I provided). You should try to keep it covered for a week. You will follow up with Dr. Saralyn Pilar in the office. If you have bleeding from the site, lie down and apply firm pressure for 20 minutes, if it continues to bleed, call our office [phone]). Avoid heavy lifting, squatting (other than sitting) and strenuous activity for 1 week. You will get a call from [employer] to set up your pacemaker and the [employer] device. You do not need to bring this device with you to appointments. It is used to monitor your pacemaker from home.     Do not shower till Saturday use local banded as needed which is provided. Follow-up with Dr. Josefa Half on Monday or Tuesday

## 2021-03-27 DIAGNOSIS — Z950 Presence of cardiac pacemaker: Secondary | ICD-10-CM | POA: Insufficient documentation

## 2021-03-29 ENCOUNTER — Other Ambulatory Visit: Payer: Self-pay

## 2021-03-29 ENCOUNTER — Encounter: Payer: Self-pay | Admitting: Cardiology

## 2021-03-29 ENCOUNTER — Encounter
Admission: RE | Disposition: A | Discharge status: Home / Self Care | Payer: Self-pay | Source: Home / Self Care | Attending: Cardiology

## 2021-03-29 ENCOUNTER — Ambulatory Visit
Admission: RE | Admit: 2021-03-29 | Discharge: 2021-03-29 | Disposition: A | Discharge status: Home / Self Care | Payer: Medicare HMO | Attending: Cardiology | Admitting: Cardiology

## 2021-03-29 DIAGNOSIS — I2510 Atherosclerotic heart disease of native coronary artery without angina pectoris: Secondary | ICD-10-CM

## 2021-03-29 DIAGNOSIS — R55 Syncope and collapse: Secondary | ICD-10-CM | POA: Diagnosis present

## 2021-03-29 HISTORY — DX: Atherosclerotic heart disease of native coronary artery without angina pectoris: I25.10

## 2021-03-29 HISTORY — PX: LEFT HEART CATH AND CORONARY ANGIOGRAPHY: CATH118249

## 2021-03-29 HISTORY — DX: Disease of blood and blood-forming organs, unspecified: D75.9

## 2021-03-29 SURGERY — LEFT HEART CATH AND CORONARY ANGIOGRAPHY
Anesthesia: Moderate Sedation | Laterality: Left

## 2021-03-29 MED ORDER — SODIUM CHLORIDE 0.9% FLUSH: 3.0000 mL | Freq: Two times a day (BID) | INTRAVENOUS | Status: DC

## 2021-03-29 MED ORDER — SODIUM CHLORIDE 0.9 % IV SOLN: 250.0000 mL | INTRAVENOUS | Status: DC | PRN

## 2021-03-29 MED ORDER — MIDAZOLAM HCL 2 MG/2ML IJ SOLN
INTRAMUSCULAR | Status: AC
  Filled 2021-03-29: qty 2

## 2021-03-29 MED ORDER — SODIUM CHLORIDE 0.9% FLUSH: 3.0000 mL | INTRAVENOUS | Status: DC | PRN

## 2021-03-29 MED ORDER — VERAPAMIL HCL 2.5 MG/ML IV SOLN
INTRAVENOUS | Status: DC | PRN
  Administered 2021-03-29: 2.5 mg via INTRA_ARTERIAL

## 2021-03-29 MED ORDER — FENTANYL CITRATE (PF) 100 MCG/2ML IJ SOLN
INTRAMUSCULAR | Status: DC | PRN
  Administered 2021-03-29: 25 ug via INTRAVENOUS

## 2021-03-29 MED ORDER — SODIUM CHLORIDE 0.9% FLUSH
3.0000 mL | INTRAVENOUS | Status: DC | PRN
Start: 2021-03-29 — End: 2021-03-29

## 2021-03-29 MED ORDER — HEPARIN (PORCINE) IN NACL 1000-0.9 UT/500ML-% IV SOLN
INTRAVENOUS | Status: AC
  Filled 2021-03-29: qty 1000

## 2021-03-29 MED ORDER — VERAPAMIL HCL 2.5 MG/ML IV SOLN
INTRAVENOUS | Status: AC
  Filled 2021-03-29: qty 2

## 2021-03-29 MED ORDER — SODIUM CHLORIDE 0.9 % WEIGHT BASED INFUSION: 1.0000 mL/kg/h | INTRAVENOUS | Status: DC

## 2021-03-29 MED ORDER — HEPARIN (PORCINE) IN NACL 1000-0.9 UT/500ML-% IV SOLN
INTRAVENOUS | Status: DC | PRN
Start: 2021-03-29 — End: 2021-03-29
  Administered 2021-03-29 (×3): 500 mL

## 2021-03-29 MED ORDER — ASPIRIN 81 MG PO CHEW
CHEWABLE_TABLET | ORAL | Status: AC
  Filled 2021-03-29: qty 1

## 2021-03-29 MED ORDER — LIDOCAINE HCL (PF) 1 % IJ SOLN
INTRAMUSCULAR | Status: DC | PRN
  Administered 2021-03-29: 2 mL

## 2021-03-29 MED ORDER — ONDANSETRON HCL 4 MG/2ML IJ SOLN: 4.0000 mg | Freq: Four times a day (QID) | INTRAMUSCULAR | Status: DC | PRN

## 2021-03-29 MED ORDER — SODIUM CHLORIDE 0.9 % WEIGHT BASED INFUSION
3.0000 mL/kg/h | INTRAVENOUS | Status: AC
  Administered 2021-03-29: 3 mL/kg/h via INTRAVENOUS

## 2021-03-29 MED ORDER — MIDAZOLAM HCL 2 MG/2ML IJ SOLN
INTRAMUSCULAR | Status: DC | PRN
  Administered 2021-03-29: 1 mg via INTRAVENOUS

## 2021-03-29 MED ORDER — HEPARIN SODIUM (PORCINE) 1000 UNIT/ML IJ SOLN
INTRAMUSCULAR | Status: DC | PRN
  Administered 2021-03-29: 5000 [IU] via INTRAVENOUS

## 2021-03-29 MED ORDER — HYDRALAZINE HCL 20 MG/ML IJ SOLN: 10.0000 mg | INTRAMUSCULAR | Status: DC | PRN

## 2021-03-29 MED ORDER — LABETALOL HCL 5 MG/ML IV SOLN: 10.0000 mg | INTRAVENOUS | Status: DC | PRN

## 2021-03-29 MED ORDER — ASPIRIN 81 MG PO CHEW
81.0000 mg | CHEWABLE_TABLET | ORAL | Status: AC
  Administered 2021-03-29: 81 mg via ORAL

## 2021-03-29 MED ORDER — ACETAMINOPHEN 325 MG PO TABS: 650.0000 mg | ORAL_TABLET | ORAL | Status: DC | PRN

## 2021-03-29 MED ORDER — FENTANYL CITRATE (PF) 100 MCG/2ML IJ SOLN
INTRAMUSCULAR | Status: AC
  Filled 2021-03-29: qty 2

## 2021-03-29 MED ORDER — HEPARIN SODIUM (PORCINE) 1000 UNIT/ML IJ SOLN
INTRAMUSCULAR | Status: AC
  Filled 2021-03-29: qty 1

## 2021-03-29 MED ORDER — IOHEXOL 300 MG/ML  SOLN
INTRAMUSCULAR | Status: DC | PRN
  Administered 2021-03-29: 114 mL

## 2021-03-29 MED ORDER — LIDOCAINE HCL (PF) 1 % IJ SOLN
INTRAMUSCULAR | Status: AC
  Filled 2021-03-29: qty 30

## 2021-03-29 SURGICAL SUPPLY — 17 items
CATH 5F 110X4 TIG (CATHETERS) ×2 IMPLANT
CATH INFINITI 5 FR JL3.5 (CATHETERS) ×2 IMPLANT
CATH INFINITI 5F PIG 125CM (CATHETERS) ×2 IMPLANT
CATH INFINITI JR4 5F (CATHETERS) ×2 IMPLANT
DEVICE RAD TR BAND REGULAR (VASCULAR PRODUCTS) ×2 IMPLANT
DRAPE BRACHIAL (DRAPES) ×2 IMPLANT
GLIDESHEATH SLEND SS 6F .021 (SHEATH) ×2 IMPLANT
GUIDEWIRE INQWIRE 1.5J.035X260 (WIRE) ×1 IMPLANT
INQWIRE 1.5J .035X260CM (WIRE) ×2
KIT SYRINGE INJ CVI SPIKEX1 (MISCELLANEOUS) ×2 IMPLANT
PACK CARDIAC CATH (CUSTOM PROCEDURE TRAY) ×2 IMPLANT
PROTECTION STATION PRESSURIZED (MISCELLANEOUS) ×2
SET ATX SIMPLICITY (MISCELLANEOUS) ×2 IMPLANT
SHEATH 6FR 85 DEST SLENDER (SHEATH) ×2 IMPLANT
STATION PROTECTION PRESSURIZED (MISCELLANEOUS) ×1 IMPLANT
TUBING CIL FLEX 10 FLL-RA (TUBING) ×2 IMPLANT
WIRE HITORQ VERSACORE ST 145CM (WIRE) ×2 IMPLANT

## 2021-04-03 DIAGNOSIS — Z9889 Other specified postprocedural states: Secondary | ICD-10-CM | POA: Insufficient documentation

## 2021-09-19 ENCOUNTER — Other Ambulatory Visit: Payer: Self-pay | Admitting: Physician Assistant

## 2021-09-19 ENCOUNTER — Other Ambulatory Visit: Payer: Self-pay

## 2021-09-19 ENCOUNTER — Ambulatory Visit
Admission: RE | Admit: 2021-09-19 | Discharge: 2021-09-19 | Disposition: A | Discharge status: Home / Self Care | Payer: Medicare HMO | Source: Ambulatory Visit | Attending: Physician Assistant | Admitting: Physician Assistant

## 2021-09-19 DIAGNOSIS — R112 Nausea with vomiting, unspecified: Secondary | ICD-10-CM

## 2021-09-19 DIAGNOSIS — R1013 Epigastric pain: Secondary | ICD-10-CM

## 2021-09-19 LAB — POCT I-STAT CREATININE: Creatinine, Ser: 1.4 mg/dL — ABNORMAL HIGH (ref 0.61–1.24)

## 2021-09-19 MED ORDER — IOHEXOL 300 MG/ML  SOLN
100.0000 mL | Freq: Once | INTRAMUSCULAR | Status: AC | PRN
  Administered 2021-09-19: 14:00:00 100 mL via INTRAVENOUS

## 2022-01-24 ENCOUNTER — Other Ambulatory Visit: Payer: Self-pay

## 2022-01-24 ENCOUNTER — Emergency Department
Admission: EM | Admit: 2022-01-24 | Discharge: 2022-01-24 | Disposition: A | Discharge status: Home / Self Care | Payer: Medicare HMO | Attending: Emergency Medicine | Admitting: Emergency Medicine

## 2022-01-24 ENCOUNTER — Emergency Department: Payer: Medicare HMO

## 2022-01-24 DIAGNOSIS — H81399 Other peripheral vertigo, unspecified ear: Secondary | ICD-10-CM | POA: Diagnosis not present

## 2022-01-24 DIAGNOSIS — I10 Essential (primary) hypertension: Secondary | ICD-10-CM | POA: Diagnosis not present

## 2022-01-24 DIAGNOSIS — Z7901 Long term (current) use of anticoagulants: Secondary | ICD-10-CM | POA: Diagnosis not present

## 2022-01-24 DIAGNOSIS — R42 Dizziness and giddiness: Secondary | ICD-10-CM | POA: Diagnosis present

## 2022-01-24 DIAGNOSIS — Z950 Presence of cardiac pacemaker: Secondary | ICD-10-CM | POA: Insufficient documentation

## 2022-01-24 LAB — CBC
HCT: 51.3 % (ref 39.0–52.0)
Hemoglobin: 17 g/dL (ref 13.0–17.0)
MCH: 30.4 pg (ref 26.0–34.0)
MCHC: 33.1 g/dL (ref 30.0–36.0)
MCV: 91.8 fL (ref 80.0–100.0)
Platelets: 195 10*3/uL (ref 150–400)
RBC: 5.59 MIL/uL (ref 4.22–5.81)
RDW: 12.3 % (ref 11.5–15.5)
WBC: 8.6 10*3/uL (ref 4.0–10.5)
nRBC: 0 % (ref 0.0–0.2)

## 2022-01-24 LAB — BASIC METABOLIC PANEL
Anion gap: 9 (ref 5–15)
BUN: 20 mg/dL (ref 8–23)
CO2: 23 mmol/L (ref 22–32)
Calcium: 9 mg/dL (ref 8.9–10.3)
Chloride: 103 mmol/L (ref 98–111)
Creatinine, Ser: 1 mg/dL (ref 0.61–1.24)
GFR, Estimated: >60 mL/min (ref >60)
Glucose, Bld: 153 mg/dL — ABNORMAL HIGH (ref 70–99)
Potassium: 3.7 mmol/L (ref 3.5–5.1)
Sodium: 135 mmol/L (ref 135–145)

## 2022-01-24 LAB — CBG MONITORING, ED: Glucose-Capillary: 129 mg/dL — ABNORMAL HIGH (ref 70–99)

## 2022-01-24 IMAGING — CT CT HEAD W/O CM
4 series · 16 of 47 positions shown, 18 images · non-contrast
Comparison: [DATE].  [DATE].

CLINICAL DATA: Dizziness.  Diaphoresis.  Vomiting.



[Series 2: head wo · axial · 0.48mm/px · z∈[-122,+8]mm · 7 of 36 slices shown, 9 images]
[im 5/36  brain]
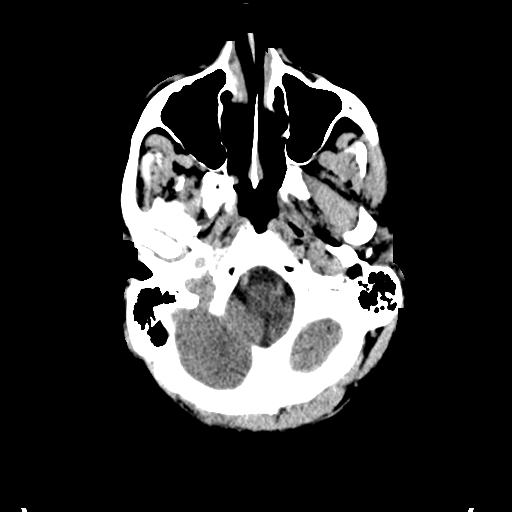
[im 5/36  bone]
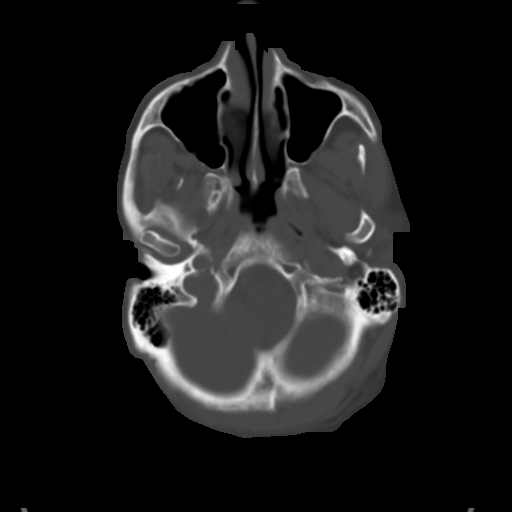
[im 9/36  brain]
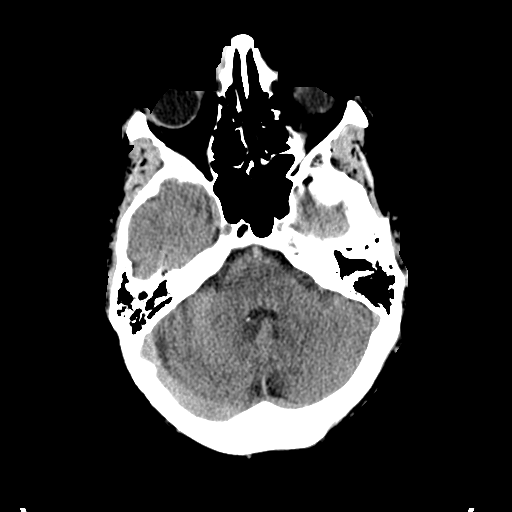
[im 14/36  brain]
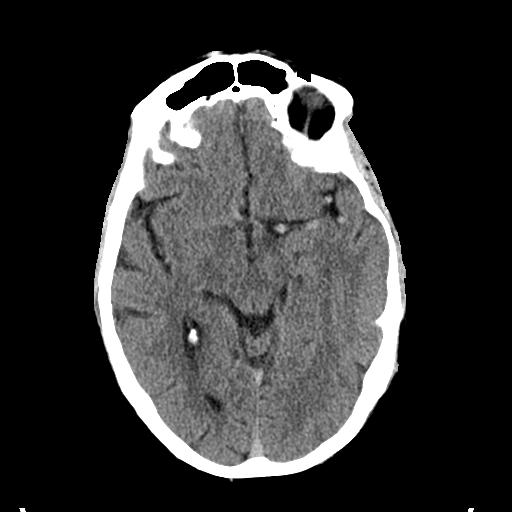
[im 18/36  brain]
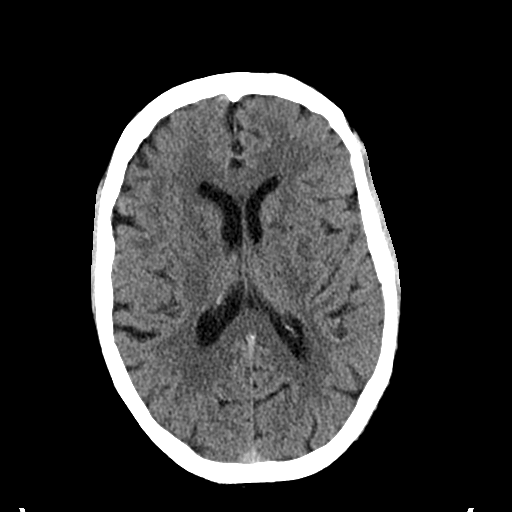
[im 22/36  brain]
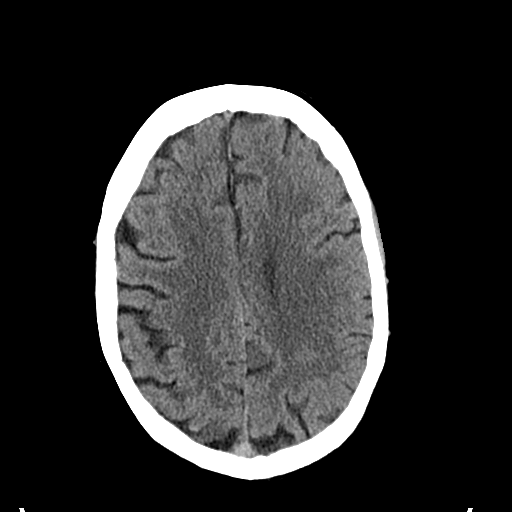
[im 22/36  bone]
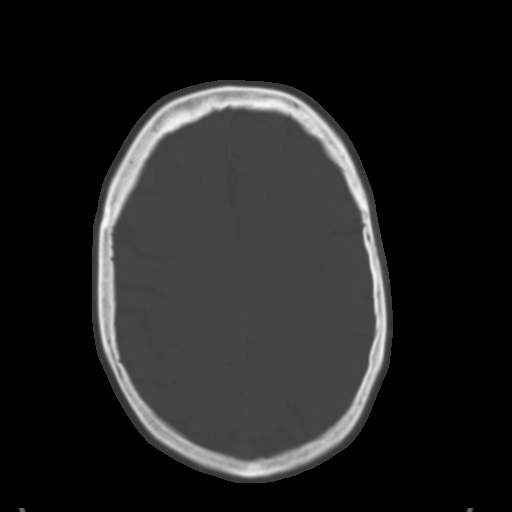
[im 27/36  brain]
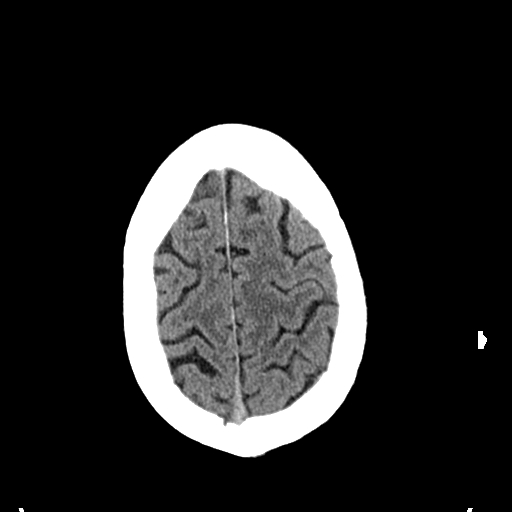
[im 31/36  brain]
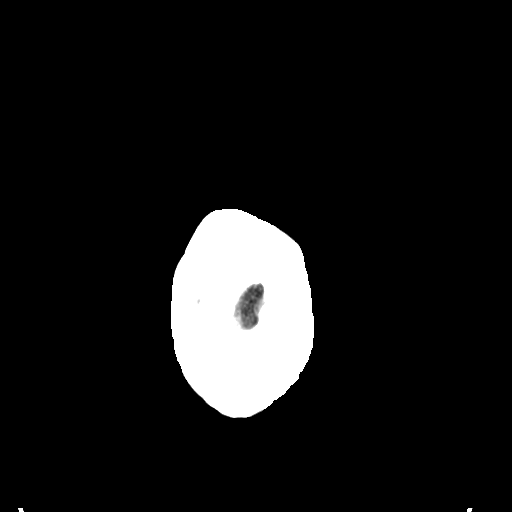

[Series 3: head bone · axial · 0.48mm/px · z∈[-126,-90]mm · 3 of 88 slices shown]
[im 9/88  bone]
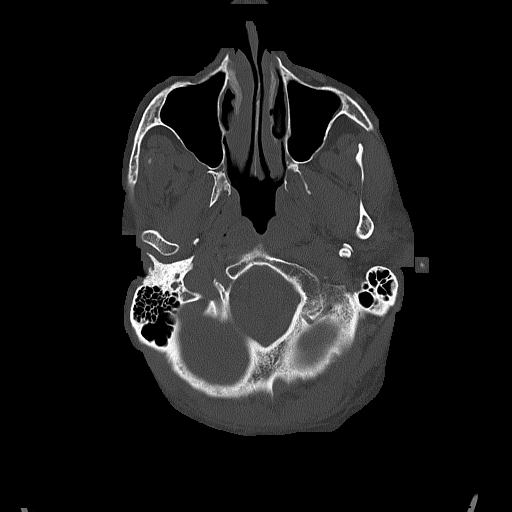
[im 18/88  bone]
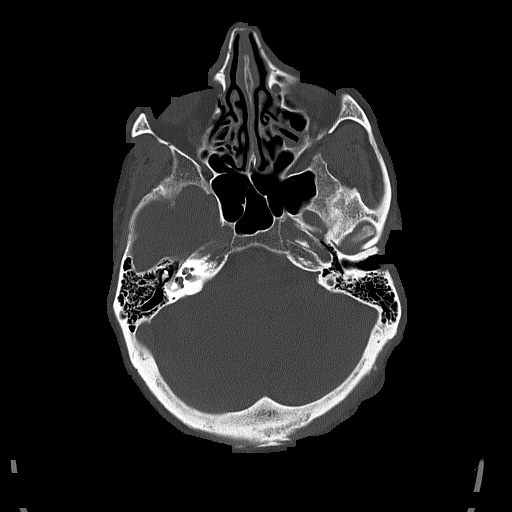
[im 27/88  bone]
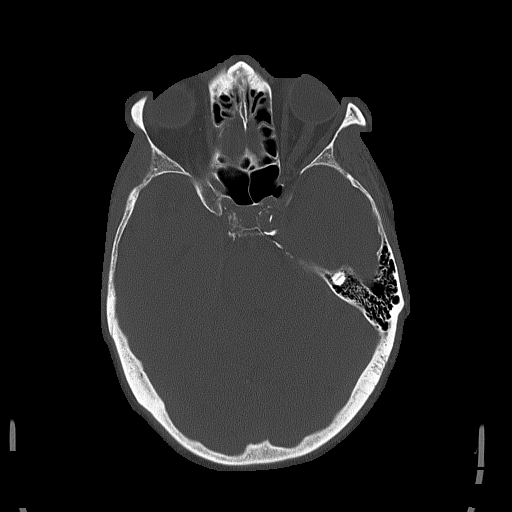

[Series 4: coronal soft tissue · coronal · 0.33mm/px · 3 of 72 slices shown]
[im 24/72  brain]
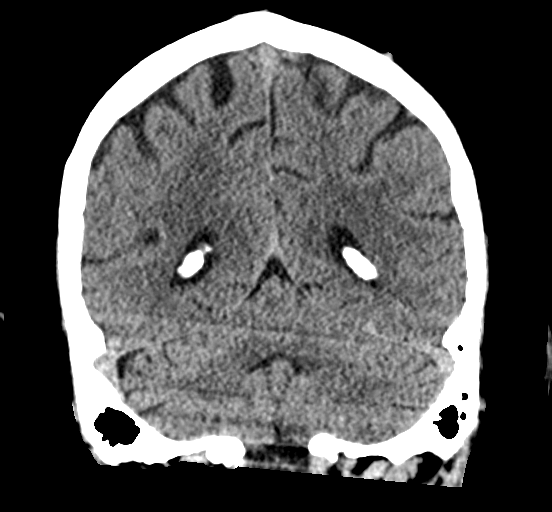
[im 32/72  brain]
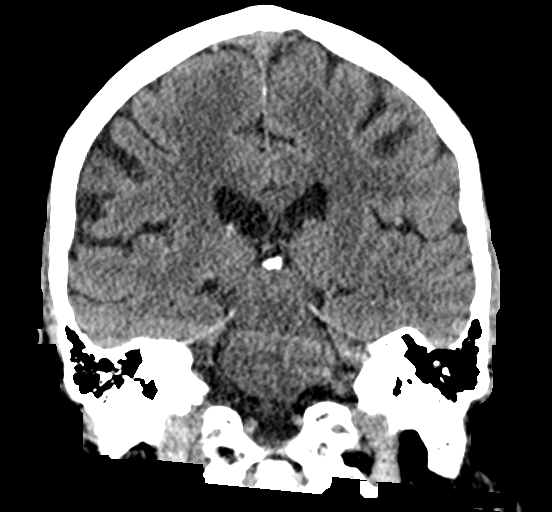
[im 40/72  brain]
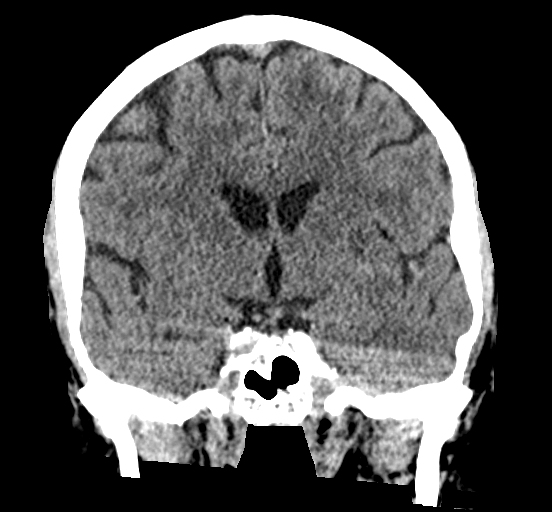

[Series 5: sagittal soft tissue · sagittal · 0.33mm/px · 3 of 61 slices shown]
[im 21/61  brain]
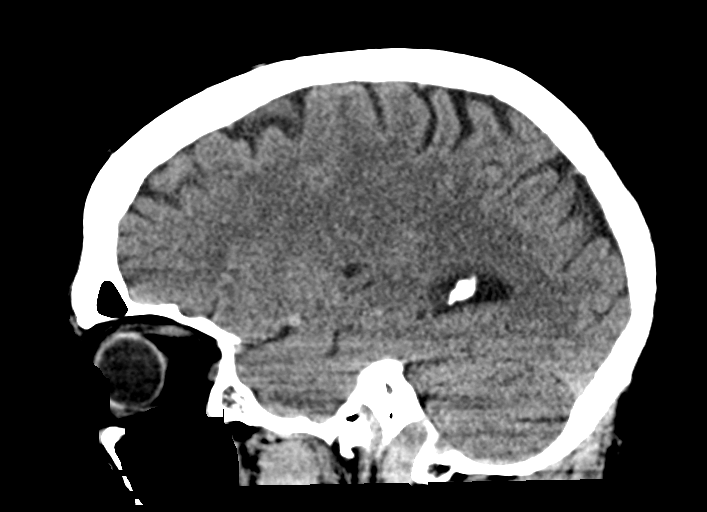
[im 31/61  brain]
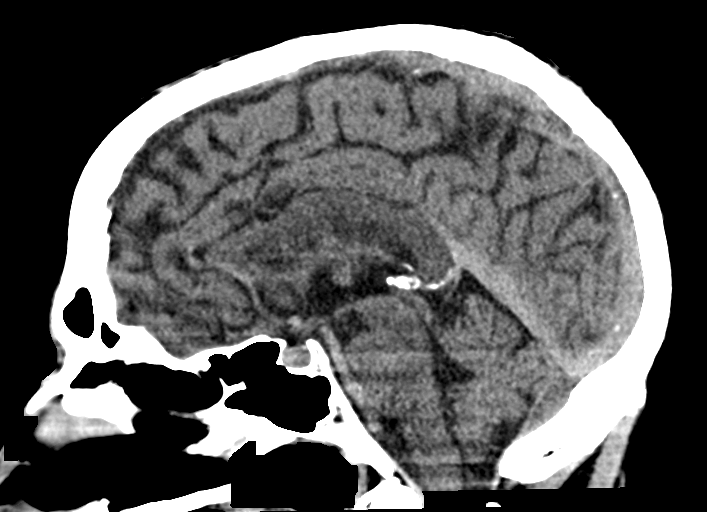
[im 41/61  brain]
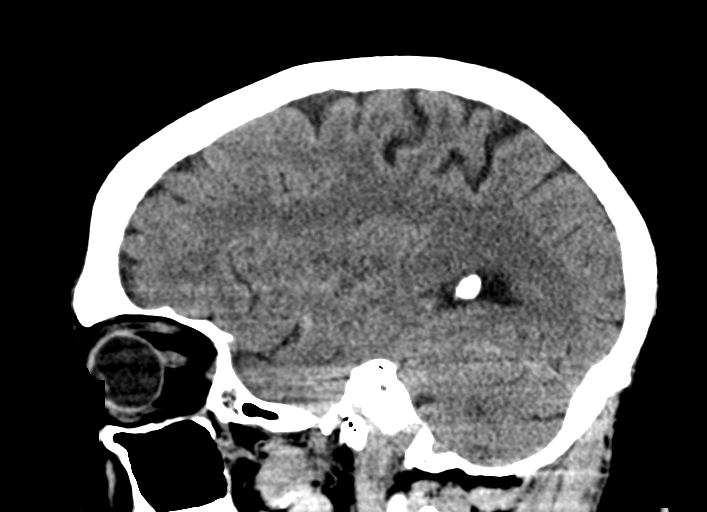

[16 of 47 positions shown; findings below may reference images not displayed]

FINDINGS: Brain: Age related volume loss. Mild chronic small-vessel ischemic
change of the cerebral hemispheric white matter. No sign of acute
infarction, mass lesion, hemorrhage, hydrocephalus or extra-axial
collection.

Vascular: There is atherosclerotic calcification of the major
vessels at the base of the brain.

Skull: Negative

Sinuses/Orbits: Clear/normal

Other: None
IMPRESSION: No acute CT finding. Chronic small-vessel ischemic changes of the
cerebral hemispheric white matter

## 2022-01-24 MED ORDER — ONDANSETRON HCL 4 MG/2ML IJ SOLN
4.0000 mg | Freq: Once | INTRAMUSCULAR | Status: AC
  Administered 2022-01-24: 4 mg via INTRAVENOUS
  Filled 2022-01-24: qty 2

## 2022-01-24 MED ORDER — ONDANSETRON 4 MG PO TBDP
4.0000 mg | ORAL_TABLET | Freq: Once | ORAL | Status: AC
  Administered 2022-01-24: 4 mg via ORAL
  Filled 2022-01-24: qty 1

## 2022-01-24 MED ORDER — ONDANSETRON 4 MG PO TBDP: 4.0000 mg | ORAL_TABLET | Freq: Four times a day (QID) | ORAL | 0 refills | Status: AC | PRN

## 2022-01-24 MED ORDER — MECLIZINE HCL 12.5 MG PO TABS: 12.5000 mg | ORAL_TABLET | Freq: Three times a day (TID) | ORAL | 0 refills | Status: AC | PRN

## 2022-01-24 MED ORDER — MECLIZINE HCL 25 MG PO TABS
12.5000 mg | ORAL_TABLET | Freq: Once | ORAL | Status: AC
  Administered 2022-01-24: 12.5 mg via ORAL
  Filled 2022-01-24: qty 1

## 2022-01-24 NOTE — ED Provider Notes (Signed)
[facility] Provider Note    Event Date/Time   First MD Initiated Contact with Patient 01/24/22 1305     (approximate)   History   Dizziness   HPI  Tyrone Luna is a 76 y.o. male who on review of clinic visit from February 13 of this year has a history of hyperlipidemia hypertension diverticulitis, on Eliquis AV leadless pacemaker  Patient reports he has had many episodes of feeling of lightheadedness and dizziness.  The last time he had was over a month ago he was looking up to put groceries away and it started.  Last about an hour he will feel nauseated sometimes vomit and then it goes away.  Seems to get better if he holds his head still.  Today he was seated in a chair at the house he turned his head to the side and felt nauseated dizzy.  No headache no chest pain no trouble breathing.  He feels like he has to hold his head in a still position to make the symptoms improved.  Reports he seen Dr. Doy Hutching physician assistant and they trialed him on meclizine once in the past  Also reports he had similar symptoms when he was diagnosed with heart block and ended up with a pacemaker a little over a year ago but denies feeling any palpitations or abnormal heart symptoms     Physical Exam   Triage Vital Signs: ED Triage Vitals  Enc Vitals Group     BP 01/24/22 1219 (!) 176/104     Pulse Rate 01/24/22 1219 71     Resp 01/24/22 1219 18     Temp --      Temp src --      SpO2 01/24/22 1219 95 %     Weight 01/24/22 1216 222 lb 0.1 oz (100.7 kg)     Height 01/24/22 1216 6' (1.829 m)     Head Circumference --      Peak Flow --      Pain Score 01/24/22 1216 0     Pain Loc --      Pain Edu? --      Excl. in [place]? --     Most recent vital signs: Vitals:   01/24/22 1219 01/24/22 1324  BP: (!) 176/104 (!) 160/94  Pulse: 71 78  Resp: 18 18  Temp:  97.8 F (36.6 C)  SpO2: 95% 98%     General: Awake, no distress.  Pleasant well oriented. CV:  Good  peripheral perfusion.  Normal heart tones Resp:  Normal effort.  Clear Abd:  No distention.  Other:  Normal cranial nerves.  Normal alertness.  No pronator drift in any extremity.  5 out of 5 strength all extremities.  Patient request utilize the bathroom, I assisted him up to his feet he reports he feels steady and does not wish to have assistance back-and-forth to the commode.  He is able to walk independently about 30 feet to the bathroom without difficulty at this time and reports his symptoms are better.   ED Results / Procedures / Treatments   Labs (all labs ordered are listed, but only abnormal results are displayed) Labs Reviewed  BASIC METABOLIC PANEL - Abnormal; Notable for the following components:      Result Value   Glucose, Bld 153 (*)    All other components within normal limits  CBG MONITORING, ED - Abnormal; Notable for the following components:   Glucose-Capillary 129 (*)  All other components within normal limits  CBC     EKG  Reviewed and interpreted by me at 1215 Heart rate 80 QRS 190 QTc 540 Ventricular paced rhythm.  Appears the patient's pacemaker is pacing with capture ECG at this time   RADIOLOGY   I personally interpreted the patient's CT scan of the head is negative for acute gross findings such as hemorrhage or large area of new stroke   CT HEAD WO CONTRAST  Result Date: 01/24/2022 CLINICAL DATA:  Dizziness.  Diaphoresis.  Vomiting. EXAM: CT HEAD WITHOUT CONTRAST TECHNIQUE: Contiguous axial images were obtained from the base of the skull through the vertex without intravenous contrast. RADIATION DOSE REDUCTION: This exam was performed according to the departmental dose-optimization program which includes automated exposure control, adjustment of the mA and/or kV according to patient size and/or use of iterative reconstruction technique. COMPARISON:  02/27/2021.  02/26/2021. FINDINGS: Brain: Age related volume loss. Mild chronic small-vessel  ischemic change of the cerebral hemispheric white matter. No sign of acute infarction, mass lesion, hemorrhage, hydrocephalus or extra-axial collection. Vascular: There is atherosclerotic calcification of the major vessels at the base of the brain. Skull: Negative Sinuses/Orbits: Clear/normal Other: None IMPRESSION: No acute CT finding. Chronic small-vessel ischemic changes of the cerebral hemispheric white matter Electronically Signed   By: Nelson Chimes M.D.   On: 01/24/2022 12:42     CT head reviewed negative for acute findings.    PROCEDURES:  Critical Care performed: No  Procedures   MEDICATIONS ORDERED IN ED: Medications  ondansetron (ZOFRAN) injection 4 mg (4 mg Intravenous Given 01/24/22 1222)  ondansetron (ZOFRAN-ODT) disintegrating tablet 4 mg (4 mg Oral Given 01/24/22 1330)  meclizine (ANTIVERT) tablet 12.5 mg (12.5 mg Oral Given 01/24/22 1329)     IMPRESSION / MDM / ASSESSMENT AND PLAN / ED COURSE  I reviewed the triage vital signs and the nursing notes.                              Differential diagnosis includes, but is not limited to, vertigo, based on clinical exam and history suspect peripheral in nature, less likely central very reassuring exam CT scan of the head without acute evidence of ischemia and he reports recurrent episodes of symptoms, also discussed case with cardiology Dr. Josefa Half (via [facility]), This patient reports similar symptoms when first diagnosed with heart block.  Pacemaker appears to be functioning well he denies any chest pain palpitations or obvious cardiac symptoms.  He reports an ongoing history of same, taken meclizine in the past and will also give Zofran today.  Very reassuring exam at this point.  Cardiology advises unlikely to represent pacemaker malfunction given review of ECG, but reasonable to interrogate pacemaker  Labs notable for normal CBC.  Normal metabolic panel.  Ambulates well without assistance at the ER as of 1:15 PM and his symptoms  are improved  Patient's presentation is most consistent with acute illness / injury with system symptoms.  The patient is on the cardiac monitor to evaluate for evidence of arrhythmia and/or significant heart rate changes.  Clinical Course as of 01/24/22 1440  Wed Jan 24, 2022  1434 Reveiwed metronic report and noted observations with Dr. Josefa Half. Advises no issues or concerns.  [MQ]  1435 [employer] report shows only one observation, notes high percent ventricular pacing in Jan and Feb, no other events. [MQ]  1435 Vent regular pacing observed only in January and February.  No further events [MQ]    Clinical Course User Index [MQ] Delman Kitten, MD   Return precautions and treatment recommendations and follow-up discussed with the patient who is agreeable with the plan.   FINAL CLINICAL IMPRESSION(S) / ED DIAGNOSES   Final diagnoses:  Peripheral vertigo, unspecified laterality     Rx / DC Orders   ED Discharge Orders          Ordered    meclizine (ANTIVERT) 12.5 MG tablet  3 times daily PRN        01/24/22 1440    ondansetron (ZOFRAN-ODT) 4 MG disintegrating tablet  Every 6 hours PRN        01/24/22 1440             Note:  This document was prepared using Dragon voice recognition software and may include unintentional dictation errors.   Delman Kitten, MD 01/24/22 [phone]

## 2022-01-24 NOTE — ED Notes (Signed)
Per triage RN pt diaphoretic and will need next bed

## 2022-01-24 NOTE — ED Provider Triage Note (Signed)
Emergency Medicine Provider Triage Evaluation Note  Tyrone Luna , a 76 y.o. male  was evaluated in triage.  Pt complains of dizziness, vomiting, diaphoresis.  Review of Systems  Positive: Dizziness, vomiting, diaphoresis, weakness Negative: Chest pain  Physical Exam  BP (!) 176/104 (BP Location: Left Arm)   Pulse 71   Resp 18   Ht 6' (1.829 m)   Wt 100.7 kg   SpO2 95%   BMI 30.11 kg/m  Gen:   Awake, no distress   Resp:  Normal effort  MSK:   Moves extremities without difficulty  Other:  Patient is sweaty, cool to touch, is able to follow all commands and answer questions  Medical Decision Making  Medically screening exam initiated at 12:29 PM.  Appropriate orders placed.  Tyrone Luna was informed that the remainder of the evaluation will be completed by another provider, this initial triage assessment does not replace that evaluation, and the importance of remaining in the ED until their evaluation is complete.  Still advised due to the patient being diaphoretic along with the dizziness that he get next bed.   Versie Starks, PA-C 01/24/22 1230

## 2022-01-24 NOTE — Discharge Instructions (Signed)
We believe your symptoms were caused by benign vertigo.  Please read through the included information and take any prescribed medication(s).  Follow up with your doctor as listed above.  If you develop any new or worsening symptoms that concern you, including but not limited to persistent dizziness/vertigo, chest pain, numbness or weakness in your arms or legs, altered mental status, persistent vomiting, or fever greater than 101, please return immediately to the Emergency Department.

## 2022-01-24 NOTE — ED Notes (Signed)
Unable to collect tempeture from pt via oral or axillary.

## 2022-01-24 NOTE — ED Triage Notes (Signed)
Pt here via [facility] from home with dizziness. Pt also diaphoretic and vomiting. Pt was here recently for same but states he was not put on medication. Pt has a pacemaker form 1 year ago.   140-81 78 98%

## 2022-02-22 ENCOUNTER — Other Ambulatory Visit: Payer: Self-pay

## 2022-02-22 ENCOUNTER — Encounter: Payer: Self-pay | Admitting: *Deleted

## 2022-02-22 DIAGNOSIS — R55 Syncope and collapse: Secondary | ICD-10-CM | POA: Diagnosis not present

## 2022-02-22 DIAGNOSIS — I10 Essential (primary) hypertension: Secondary | ICD-10-CM | POA: Diagnosis not present

## 2022-02-22 DIAGNOSIS — R42 Dizziness and giddiness: Secondary | ICD-10-CM | POA: Diagnosis present

## 2022-02-22 DIAGNOSIS — Z7901 Long term (current) use of anticoagulants: Secondary | ICD-10-CM | POA: Diagnosis not present

## 2022-02-22 LAB — CBC
HCT: 49.4 % (ref 39.0–52.0)
Hemoglobin: 16.3 g/dL (ref 13.0–17.0)
MCH: 30.6 pg (ref 26.0–34.0)
MCHC: 33 g/dL (ref 30.0–36.0)
MCV: 92.9 fL (ref 80.0–100.0)
Platelets: 173 10*3/uL (ref 150–400)
RBC: 5.32 MIL/uL (ref 4.22–5.81)
RDW: 12.5 % (ref 11.5–15.5)
WBC: 6.4 10*3/uL (ref 4.0–10.5)
nRBC: 0 % (ref 0.0–0.2)

## 2022-02-22 LAB — BASIC METABOLIC PANEL
Anion gap: 7 (ref 5–15)
BUN: 16 mg/dL (ref 8–23)
CO2: 26 mmol/L (ref 22–32)
Calcium: 9.2 mg/dL (ref 8.9–10.3)
Chloride: 105 mmol/L (ref 98–111)
Creatinine, Ser: 1 mg/dL (ref 0.61–1.24)
GFR, Estimated: >60 mL/min (ref >60)
Glucose, Bld: 122 mg/dL — ABNORMAL HIGH (ref 70–99)
Potassium: 4 mmol/L (ref 3.5–5.1)
Sodium: 138 mmol/L (ref 135–145)

## 2022-02-22 LAB — TROPONIN I (HIGH SENSITIVITY)
Troponin I (High Sensitivity): 7 ng/L (ref <18)
Troponin I (High Sensitivity): 8 ng/L (ref <18)

## 2022-02-22 NOTE — ED Provider Triage Note (Signed)
Emergency Medicine Provider Triage Evaluation Note  Tyrone Luna , a 76 y.o. male  was evaluated in triage.  Pt complains of feeling lightheaded. Symptoms started today. Feels different than previous vertigo. No syncope, chest pain, or shortness of breath..  Physical Exam  BP 127/84 (BP Location: Right Arm)   Pulse 70   Temp 98.1 F (36.7 C) (Oral)   Resp 16   SpO2 97%  Gen:   Awake, no distress   Resp:  Normal effort  MSK:   Moves extremities without difficulty  Other:    Medical Decision Making  Medically screening exam initiated at 4:58 PM.  Appropriate orders placed.  Tyrone Luna was informed that the remainder of the evaluation will be completed by another provider, this initial triage assessment does not replace that evaluation, and the importance of remaining in the ED until their evaluation is complete.    Victorino Dike, FNP 02/22/22 1659

## 2022-02-22 NOTE — ED Triage Notes (Signed)
Pt reports feeling light headed today  sx began this am.  Hx vertigo.  No chest pain or sob.  No n/v/d.  Pt alert  speech clear.

## 2022-02-23 ENCOUNTER — Emergency Department
Admission: EM | Admit: 2022-02-23 | Discharge: 2022-02-23 | Disposition: A | Discharge status: Home / Self Care | Payer: Medicare HMO | Attending: Emergency Medicine | Admitting: Emergency Medicine

## 2022-02-23 DIAGNOSIS — R42 Dizziness and giddiness: Secondary | ICD-10-CM

## 2022-02-23 LAB — URINALYSIS, ROUTINE W REFLEX MICROSCOPIC
Bilirubin Urine: NEGATIVE
Glucose, UA: NEGATIVE mg/dL
Hgb urine dipstick: NEGATIVE
Ketones, ur: NEGATIVE mg/dL
Leukocytes,Ua: NEGATIVE
Nitrite: NEGATIVE
Protein, ur: NEGATIVE mg/dL
Specific Gravity, Urine: 1.019 (ref 1.005–1.030)
pH: 5 (ref 5.0–8.0)

## 2022-02-23 LAB — CBG MONITORING, ED: Glucose-Capillary: 80 mg/dL (ref 70–99)

## 2022-02-23 NOTE — ED Notes (Signed)
Patient ambulated in the hallway. Patient stated he was not dizzy while ambulating or standing still.

## 2022-02-23 NOTE — Discharge Instructions (Signed)
Return to the ER for new, worsening, or persistent severe lightheadedness, vertigo, weakness, or any other new or worsening symptoms that concern you.

## 2022-02-23 NOTE — ED Provider Notes (Signed)
[facility] Provider Note    Event Date/Time   First MD Initiated Contact with Patient 02/23/22 0100     (approximate)   History   Dizziness   HPI  Tyrone Luna is a 76 y.o. male with a history of hyperlipidemia, hypertension, diverticulitis, pacemaker, on Eliquis, and with vertigo, who presents with dizziness since he got up this morning, described as lightheadedness, and now mostly resolved.  The patient states that it occurred when he would get up and try to walk around but then would stop when he sat or lie down.  While he was waiting in the ER for over 8 hours it seems to have resolved.  He denies any associated spinning or vertigo type symptoms and states it feels different than his peripheral vertigo.  He denies any associated headache, chest pain, difficulty breathing, nausea or vomiting, fever or chills, urinary symptoms, or any other acute symptoms.    Physical Exam   Triage Vital Signs: ED Triage Vitals  Enc Vitals Group     BP 02/22/22 1656 127/84     Pulse Rate 02/22/22 1656 70     Resp 02/22/22 1656 16     Temp 02/22/22 1656 98.1 F (36.7 C)     Temp Source 02/22/22 1656 Oral     SpO2 02/22/22 1656 97 %     Weight 02/22/22 1659 229 lb (103.9 kg)     Height 02/22/22 1659 6' (1.829 m)     Head Circumference --      Peak Flow --      Pain Score --      Pain Loc --      Pain Edu? --      Excl. in [place]? --     Most recent vital signs: Vitals:   02/23/22 0115 02/23/22 0120  BP: (!) 170/90   Pulse: 66   Resp: 14   Temp:  97.9 F (36.6 C)  SpO2: 100%      General: Alert and oriented, well-appearing. CV:  Good peripheral perfusion.  Resp:  Normal effort.  Abd:  No distention.  Other:  No peripheral edema.  EOMI.  PERRLA.  No nystagmus.  Motor and sensory intact in all extremities.  No facial droop.  No pronator drift.  No ataxia on finger-to-nose.  Normal gait.   ED Results / Procedures / Treatments   Labs (all labs  ordered are listed, but only abnormal results are displayed) Labs Reviewed  BASIC METABOLIC PANEL - Abnormal; Notable for the following components:      Result Value   Glucose, Bld 122 (*)    All other components within normal limits  URINALYSIS, ROUTINE W REFLEX MICROSCOPIC - Abnormal; Notable for the following components:   Color, Urine YELLOW (*)    APPearance CLEAR (*)    All other components within normal limits  CBC  CBG MONITORING, ED  TROPONIN I (HIGH SENSITIVITY)  TROPONIN I (HIGH SENSITIVITY)     EKG  ED ECG REPORT I, Arta Silence, the attending physician, personally viewed and interpreted this ECG.  Date: 02/22/2022 EKG Time: 1659 Rate: 77 Rhythm: Ventricular paced rhythm ST/T Wave abnormalities: normal Narrative Interpretation: no evidence of acute ischemia; no significant change when compared to EKG of 01/24/2022    RADIOLOGY   PROCEDURES:  Critical Care performed: No  Procedures   MEDICATIONS ORDERED IN ED: Medications - No data to display   IMPRESSION / MDM / [place] / ED COURSE  I reviewed the triage vital signs and the nursing notes.  76 year old male with PMH as noted above presents with nonspecific lightheadedness that started this morning and was associated with getting up and walking around.  It was resolved when sitting or lying flat.  He had no associated vertigo.  I reviewed the past medical records.  The patient was most recently admitted last July.  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  On exam today he is well-appearing.  He is slightly hypertensive with otherwise normal vital signs.  The rest of the physical exam is unremarkable.  Thorough neurologic exam is normal.  Differential diagnosis includes, but is not limited to, dehydration/hypovolemia, electrolyte abnormality, AKI, other metabolic cause, less likely cardiac  etiology.  Given the lack of vertigo, any focal neurologic symptoms, or any findings on exam, there is no evidence of stroke or other CNS cause.  Patient's presentation is most consistent with acute presentation with potential threat to life or bodily function.  The patient is on the cardiac monitor to evaluate for evidence of arrhythmia and/or significant heart rate changes.  The patient had to wait to be seen for over 8 hours.  During this time his symptoms have resolved.  Lab work-up is reassuring.  BMP is normal.  CBC shows no leukocytosis, anemia, or other acute findings.  Troponins are negative x2.  Urinalysis shows no evidence of UTI.  ----------------------------------------- 1:31 AM on 02/23/2022 -----------------------------------------  The patient got up and walked around and is not orthostatic.  His symptoms have completely resolved.  There is no indication for imaging.  He is stable for discharge at this time.  Return precautions given, and he expresses understanding.    FINAL CLINICAL IMPRESSION(S) / ED DIAGNOSES   Final diagnoses:  Lightheadedness     Rx / DC Orders   ED Discharge Orders     None        Note:  This document was prepared using Dragon voice recognition software and may include unintentional dictation errors.    Arta Silence, MD 02/23/22 [phone]

## 2022-09-09 DIAGNOSIS — M1711 Unilateral primary osteoarthritis, right knee: Principal | ICD-10-CM | POA: Insufficient documentation

## 2022-11-15 ENCOUNTER — Other Ambulatory Visit (HOSPITAL_COMMUNITY): Payer: Self-pay | Admitting: Physician Assistant

## 2022-11-15 DIAGNOSIS — M5416 Radiculopathy, lumbar region: Secondary | ICD-10-CM

## 2023-03-15 NOTE — Discharge Instructions (Addendum)
Instructions after Total Knee Replacement   James P. Angie Fava., M.D.    Dept. of Orthopaedics & Sports Medicine [facility] [address] [place], [place]  [address]  Phone: [phone]   Fax: [phone]       [email or web address]       DIET: Drink plenty of non-alcoholic fluids. Resume your normal diet. Include foods high in fiber.  ACTIVITY:  You may use crutches or a walker with weight-bearing as tolerated, unless instructed otherwise. You may be weaned off of the walker or crutches by your Physical Therapist.  Do NOT place pillows under the knee. Anything placed under the knee could limit your ability to straighten the knee.   Continue doing gentle exercises. Exercising will reduce the pain and swelling, increase motion, and prevent muscle weakness.   Please continue to use the TED compression stockings for 6 weeks. You may remove the stockings at night, but should reapply them in the morning. Do not drive or operate any equipment until instructed.  WOUND CARE:  Continue to use the PolarCare or ice packs periodically to reduce pain and swelling. You may bathe or shower after the staples are removed at the first office visit following surgery. The Aquacel bandage remains in place for 7 days postoperatively.  This can be changed out with a honeycomb dressing.  At home PT can help with this.  MEDICATIONS: You may resume your regular medications. Please take the pain medication as prescribed on the medication. Do not take pain medication on an empty stomach. You have been given a prescription for a blood thinner.  Continue on your at home Eliquis and blood thinning medications for DVT prophylaxis.  Take this in conjunction with your TED hose stockings Do not drive or drink alcoholic beverages when taking pain medications.  CALL THE OFFICE FOR: Temperature above 101 degrees Excessive bleeding or drainage on the dressing. Excessive swelling, coldness, or paleness of  the toes. Persistent nausea and vomiting.  FOLLOW-UP:  You should have an appointment to return to the office in 10-14 days after surgery. Arrangements have been made for continuation of Physical Therapy (either home therapy or outpatient therapy).     [facility] Department Directory         [email or web address]       [email or web address]          Cardiology  Appointments: [place] - [phone]  Endocrinology  Appointments: [place] [phone] [place] - [phone]  Gastroenterology  Appointments: [place] [phone] [place] - [phone]        General Surgery   Appointments: [facility]  Internal Medicine/Family Medicine  Appointments: [facility] [place] - [phone] [place] - [phone]  Metabolic and Weigh Loss Surgery  Appointments: [facility]        Neurology  Appointments: [place] [phone] [place] - [phone]  Neurosurgery  Appointments: [place]  Obstetrics & Gynecology  Appointments: [place] [phone] [place] - [phone]        Pediatrics  Appointments: [place] [phone] [place] - [phone]  Physiatry  Appointments: [place] [phone]  Physical Therapy  Appointments: [place] - [phone]        Podiatry  Appointments: [place] [phone] [place] - [phone]  Pulmonology  Appointments: [place]  Rheumatology  Appointments: [place] [phone]        [place] Location: [facility]  [address] [place], [place]  [address]  [place] Location: [facility] [address]  [place], [place]  [address]  [place] Location: [facility] [address] [place], [place]  [address]

## 2023-03-18 ENCOUNTER — Encounter
Admission: RE | Admit: 2023-03-18 | Discharge: 2023-03-18 | Disposition: A | Discharge status: Home / Self Care | Payer: Medicare HMO | Source: Ambulatory Visit | Attending: Orthopedic Surgery | Admitting: Orthopedic Surgery

## 2023-03-18 ENCOUNTER — Encounter: Payer: Self-pay | Admitting: Orthopedic Surgery

## 2023-03-18 VITALS — BP 112/78 | Temp 98.2°F | Resp 20 | Ht 69.02 in | Wt 230.7 lb

## 2023-03-18 DIAGNOSIS — N179 Acute kidney failure, unspecified: Secondary | ICD-10-CM

## 2023-03-18 DIAGNOSIS — Z01818 Encounter for other preprocedural examination: Secondary | ICD-10-CM | POA: Insufficient documentation

## 2023-03-18 DIAGNOSIS — M1711 Unilateral primary osteoarthritis, right knee: Secondary | ICD-10-CM | POA: Diagnosis not present

## 2023-03-18 DIAGNOSIS — Z01812 Encounter for preprocedural laboratory examination: Secondary | ICD-10-CM

## 2023-03-18 DIAGNOSIS — Z01810 Encounter for preprocedural cardiovascular examination: Secondary | ICD-10-CM | POA: Diagnosis not present

## 2023-03-18 DIAGNOSIS — E876 Hypokalemia: Secondary | ICD-10-CM

## 2023-03-18 HISTORY — DX: Other intervertebral disc displacement, lumbar region: M51.26

## 2023-03-18 HISTORY — DX: Other intervertebral disc degeneration, lumbar region: M51.36

## 2023-03-18 HISTORY — DX: Atrioventricular block, complete: I44.2

## 2023-03-18 HISTORY — DX: Other intervertebral disc degeneration, lumbar region without mention of lumbar back pain or lower extremity pain: M51.369

## 2023-03-18 HISTORY — DX: Radiculopathy, lumbar region: M54.16

## 2023-03-18 LAB — CBC
HCT: 47.2 % (ref 39.0–52.0)
Hemoglobin: 16.5 g/dL (ref 13.0–17.0)
MCH: 32 pg (ref 26.0–34.0)
MCHC: 35 g/dL (ref 30.0–36.0)
MCV: 91.5 fL (ref 80.0–100.0)
Platelets: 164 10*3/uL (ref 150–400)
RBC: 5.16 MIL/uL (ref 4.22–5.81)
RDW: 12.1 % (ref 11.5–15.5)
WBC: 6.2 10*3/uL (ref 4.0–10.5)
nRBC: 0 % (ref 0.0–0.2)

## 2023-03-18 LAB — URINALYSIS, ROUTINE W REFLEX MICROSCOPIC
Bilirubin Urine: NEGATIVE
Glucose, UA: NEGATIVE mg/dL
Hgb urine dipstick: NEGATIVE
Ketones, ur: NEGATIVE mg/dL
Leukocytes,Ua: NEGATIVE
Nitrite: NEGATIVE
Protein, ur: NEGATIVE mg/dL
Specific Gravity, Urine: 1.02 (ref 1.005–1.030)
pH: 5 (ref 5.0–8.0)

## 2023-03-18 LAB — TYPE AND SCREEN
ABO/RH(D): A POS
Antibody Screen: NEGATIVE

## 2023-03-18 LAB — COMPREHENSIVE METABOLIC PANEL
ALT: 14 U/L (ref 0–44)
AST: 18 U/L (ref 15–41)
Albumin: 4.1 g/dL (ref 3.5–5.0)
Alkaline Phosphatase: 37 U/L — ABNORMAL LOW (ref 38–126)
Anion gap: 7 (ref 5–15)
BUN: 21 mg/dL (ref 8–23)
CO2: 28 mmol/L (ref 22–32)
Calcium: 9.1 mg/dL (ref 8.9–10.3)
Chloride: 102 mmol/L (ref 98–111)
Creatinine, Ser: 1.02 mg/dL (ref 0.61–1.24)
GFR, Estimated: >60 mL/min (ref >60)
Glucose, Bld: 94 mg/dL (ref 70–99)
Potassium: 3.7 mmol/L (ref 3.5–5.1)
Sodium: 137 mmol/L (ref 135–145)
Total Bilirubin: 1.2 mg/dL (ref 0.3–1.2)
Total Protein: 6.6 g/dL (ref 6.5–8.1)

## 2023-03-18 LAB — C-REACTIVE PROTEIN: CRP: 1.1 mg/dL — ABNORMAL HIGH (ref <1.0)

## 2023-03-18 LAB — SURGICAL PCR SCREEN
MRSA, PCR: NEGATIVE
Staphylococcus aureus: NEGATIVE

## 2023-03-18 LAB — SEDIMENTATION RATE: Sed Rate: 2 mm/hr (ref 0–20)

## 2023-03-18 NOTE — Patient Instructions (Addendum)
Your procedure is scheduled on: 03/27/2023  Wednesday Report to the Registration Desk on the 1st floor of the [facility]. To find out your arrival time, please call [phone] between 1PM - 3PM on: 03/26/2023  If your arrival time is 6:00 am, do not arrive before that time as the Medical Mall entrance doors do not open until 6:00 am.  REMEMBER: Instructions that are not followed completely may result in serious medical risk, up to and including death; or upon the discretion of your surgeon and anesthesiologist your surgery may need to be rescheduled.  Do not eat food after midnight the night before surgery.  No gum chewing or hard candies.  You may however, drink CLEAR liquids up to 2 hours before you are scheduled to arrive for your surgery. Do not drink anything within 2 hours of your scheduled arrival time.  Clear liquids include: - water  - apple juice without pulp - gatorade (not RED colors) - black coffee or tea (Do NOT add milk or creamers to the coffee or tea) Do NOT drink anything that is not on this list.  In addition, your doctor has ordered for you to drink the provided:  Ensure Pre-Surgery Clear Carbohydrate Drink   Drinking this carbohydrate drink up to two hours before surgery helps to reduce insulin resistance and improve patient outcomes. Please complete drinking 2 hours before scheduled arrival time.  One week prior to surgery: Stop Anti-inflammatories (NSAIDS) such as Advil, Aleve, Ibuprofen, Motrin, Naproxen, Naprosyn and Aspirin based products such as Excedrin, Goody's Powder, BC Powder. Stop ANY OVER THE COUNTER supplements until after surgery. You may however, continue to take Tylenol if needed for pain up until the day of surgery.  Continue taking all prescribed medications with the exception of the following:    Eliquis- follow the healthcare provider's instruction about ELiquis which is to hold 2 days prior to surgery and hold day of surgery.   Aspirin -  ask your healthcare provider about regarding aspirin.                                Follow recommendations from Cardiologist or PCP regarding stopping blood thinners.  TAKE ONLY THESE MEDICATIONS THE MORNING OF SURGERY WITH A SIP OF WATER:  metoprolol succinate (TOPROL-XL)  pantoprazole (PROTONIX)  (take one the night before and one on the morning of surgery - helps to prevent nausea after surgery.) Pilocarpine   No Alcohol for 24 hours before or after surgery.  No Smoking including e-cigarettes for 24 hours before surgery.  No chewable tobacco products for at least 6 hours before surgery.  No nicotine patches on the day of surgery.  Do not use any "recreational" drugs for at least a week (preferably 2 weeks) before your surgery.  Please be advised that the combination of cocaine and anesthesia may have negative outcomes, up to and including death. If you test positive for cocaine, your surgery will be cancelled.  On the morning of surgery brush your teeth with toothpaste and water, you may rinse your mouth with mouthwash if you wish. Do not swallow any toothpaste or mouthwash.  Use CHG Soap or wipes as directed on instruction sheet.  Do not wear jewelry, make-up, hairpins, clips or nail polish.  Do not wear lotions, powders, or perfumes.   Do not shave body hair from the neck down 48 hours before surgery.  Contact lenses, hearing aids and dentures may  not be worn into surgery.  Do not bring valuables to the hospital. [facility] is not responsible for any missing/lost belongings or valuables.     Notify your doctor if there is any change in your medical condition (cold, fever, infection).  Wear comfortable clothing (specific to your surgery type) to the hospital.  After surgery, you can help prevent lung complications by doing breathing exercises.  Take deep breaths and cough every 1-2 hours. Your doctor may order a device called an Incentive Spirometer to help you take  deep breaths.  If you are being admitted to the hospital overnight, leave your suitcase in the car. After surgery it may be brought to your room.  In case of increased patient census, it may be necessary for you, the patient, to continue your postoperative care in the Same Day Surgery department.  If you are being discharged the day of surgery, you will not be allowed to drive home. You will need a responsible individual to drive you home and stay with you for 24 hours after surgery.    Please call the Pre-admissions Testing Dept. at [phone] if you have any questions about these instructions.  Surgery Visitation Policy:  Patients having surgery or a procedure may have two visitors.  Children under the age of 41 must have an adult with them who is not the patient.  Inpatient Visitation:    Visiting hours are 7 a.m. to 8 p.m. Up to four visitors are allowed at one time in a patient room. The visitors may rotate out with other people during the day.  One visitor age 12 or older may stay with the patient overnight and must be in the room by 8 p.m.       Pre-operative 5 CHG Bath Instructions   You can play a key role in reducing the risk of infection after surgery. Your skin needs to be as free of germs as possible. You can reduce the number of germs on your skin by washing with CHG (chlorhexidine gluconate) soap before surgery. CHG is an antiseptic soap that kills germs and continues to kill germs even after washing.   DO NOT use if you have an allergy to chlorhexidine/CHG or antibacterial soaps. If your skin becomes reddened or irritated, stop using the CHG and notify one of our RNs at [phone].   Please shower with the CHG soap starting 4 days before surgery using the following schedule:     Please keep in mind the following:  DO NOT shave, including legs and underarms, starting the day of your first shower.   You may shave your face at any point before/day of  surgery.  Place clean sheets on your bed the day you start using CHG soap. Use a clean washcloth (not used since being washed) for each shower. DO NOT sleep with pets once you start using the CHG.   CHG Shower Instructions:  If you choose to wash your hair and private area, wash first with your normal shampoo/soap.  After you use shampoo/soap, rinse your hair and body thoroughly to remove shampoo/soap residue.  Turn the water OFF and apply about 3 tablespoons (45 ml) of CHG soap to a CLEAN washcloth.  Apply CHG soap ONLY FROM YOUR NECK DOWN TO YOUR TOES (washing for 3-5 minutes)  DO NOT use CHG soap on face, private areas, open wounds, or sores.  Pay special attention to the area where your surgery is being performed.  If you are having back  surgery, having someone wash your back for you may be helpful. Wait 2 minutes after CHG soap is applied, then you may rinse off the CHG soap.  Pat dry with a clean towel  Put on clean clothes/pajamas   If you choose to wear lotion, please use ONLY the CHG-compatible lotions on the back of this paper.     Additional instructions for the day of surgery: DO NOT APPLY any lotions, deodorants, cologne, or perfumes.   Put on clean/comfortable clothes.  Brush your teeth.  Ask your nurse before applying any prescription medications to the skin.      CHG Compatible Lotions   Aveeno Moisturizing lotion  Cetaphil Moisturizing Cream  Cetaphil Moisturizing Lotion  Clairol Herbal Essence Moisturizing Lotion, Dry Skin  Clairol Herbal Essence Moisturizing Lotion, Extra Dry Skin  Clairol Herbal Essence Moisturizing Lotion, Normal Skin  Curel Age Defying Therapeutic Moisturizing Lotion with Alpha Hydroxy  Curel Extreme Care Body Lotion  Curel Soothing Hands Moisturizing Hand Lotion  Curel Therapeutic Moisturizing Cream, Fragrance-Free  Curel Therapeutic Moisturizing Lotion, Fragrance-Free  Curel Therapeutic Moisturizing Lotion, Original Formula  Eucerin  Daily Replenishing Lotion  Eucerin Dry Skin Therapy Plus Alpha Hydroxy Crme  Eucerin Dry Skin Therapy Plus Alpha Hydroxy Lotion  Eucerin Original Crme  Eucerin Original Lotion  Eucerin Plus Crme Eucerin Plus Lotion  Eucerin TriLipid Replenishing Lotion  Keri Anti-Bacterial Hand Lotion  Keri Deep Conditioning Original Lotion Dry Skin Formula Softly Scented  Keri Deep Conditioning Original Lotion, Fragrance Free Sensitive Skin Formula  Keri Lotion Fast Absorbing Fragrance Free Sensitive Skin Formula  Keri Lotion Fast Absorbing Softly Scented Dry Skin Formula  Keri Original Lotion  Keri Skin Renewal Lotion Keri Silky Smooth Lotion  Keri Silky Smooth Sensitive Skin Lotion  Nivea Body Creamy Conditioning Oil  Nivea Body Extra Enriched Lotion  Nivea Body Original Lotion  Nivea Body Sheer Moisturizing Lotion Nivea Crme  Nivea Skin Firming Lotion  NutraDerm 30 Skin Lotion  NutraDerm Skin Lotion  NutraDerm Therapeutic Skin Cream  NutraDerm Therapeutic Skin Lotion  ProShield Protective Hand Cream  Provon moisturizing lotion     Preoperative Educational Videos for Total Hip, Knee and Shoulder Replacements  To better prepare for surgery, please view our videos that explain the physical activity and discharge planning required to have the best surgical recovery at [facility].  [email or web address]  Questions? Call [phone] or email [email or web address]          How to Use an Incentive Spirometer  An incentive spirometer is a tool that measures how well you are filling your lungs with each breath. Learning to take long, deep breaths using this tool can help you keep your lungs clear and active. This may help to reverse or lessen your chance of developing breathing (pulmonary) problems, especially infection. You may be asked to use a spirometer: After a surgery. If you have a lung problem or a history of smoking. After a long  period of time when you have been unable to move or be active. If the spirometer includes an indicator to show the highest number that you have reached, your health care provider or respiratory therapist will help you set a goal. Keep a log of your progress as told by your health care provider. What are the risks? Breathing too quickly may cause dizziness or cause you to pass out. Take your time so you do not get dizzy or light-headed. If you are in pain, you may need to take pain medicine before doing  incentive spirometry. It is harder to take a deep breath if you are having pain. How to use your incentive spirometer  Sit up on the edge of your bed or on a chair. Hold the incentive spirometer so that it is in an upright position. Before you use the spirometer, breathe out normally. Place the mouthpiece in your mouth. Make sure your lips are closed tightly around it. Breathe in slowly and as deeply as you can through your mouth, causing the piston or the ball to rise toward the top of the chamber. Hold your breath for 3-5 seconds, or for as long as possible. If the spirometer includes a coach indicator, use this to guide you in breathing. Slow down your breathing if the indicator goes above the marked areas. Remove the mouthpiece from your mouth and breathe out normally. The piston or ball will return to the bottom of the chamber. Rest for a few seconds, then repeat the steps 10 or more times. Take your time and take a few normal breaths between deep breaths so that you do not get dizzy or light-headed. Do this every 1-2 hours when you are awake. If the spirometer includes a goal marker to show the highest number you have reached (best effort), use this as a goal to work toward during each repetition. After each set of 10 deep breaths, cough a few times. This will help to make sure that your lungs are clear. If you have an incision on your chest or abdomen from surgery, place a pillow or a  rolled-up towel firmly against the incision when you cough. This can help to reduce pain while taking deep breaths and coughing. General tips When you are able to get out of bed: Walk around often. Continue to take deep breaths and cough in order to clear your lungs. Keep using the incentive spirometer until your health care provider says it is okay to stop using it. If you have been in the hospital, you may be told to keep using the spirometer at home. Contact a health care provider if: You are having difficulty using the spirometer. You have trouble using the spirometer as often as instructed. Your pain medicine is not giving enough relief for you to use the spirometer as told. You have a fever. Get help right away if: You develop shortness of breath. You develop a cough with bloody mucus from the lungs. You have fluid or blood coming from an incision site after you cough. Summary An incentive spirometer is a tool that can help you learn to take long, deep breaths to keep your lungs clear and active. You may be asked to use a spirometer after a surgery, if you have a lung problem or a history of smoking, or if you have been inactive for a long period of time. Use your incentive spirometer as instructed every 1-2 hours while you are awake. If you have an incision on your chest or abdomen, place a pillow or a rolled-up towel firmly against your incision when you cough. This will help to reduce pain. Get help right away if you have shortness of breath, you cough up bloody mucus, or blood comes from your incision when you cough. This information is not intended to replace advice given to you by your health care provider. Make sure you discuss any questions you have with your health care provider. Document Revised: 11/02/2019 Document Reviewed: 11/02/2019 Elsevier Patient Education  2023 [employer].

## 2023-03-21 ENCOUNTER — Encounter: Payer: Self-pay | Admitting: Orthopedic Surgery

## 2023-03-21 NOTE — Progress Notes (Signed)
Perioperative / Anesthesia Services  Pre-Admission Testing Clinical Review / Preoperative Anesthesia Consult  Date: 03/22/23  Patient Demographics:  Name: Tyrone Luna DOB:   Jan 25, 1946 MRN:   [id number]  Planned Surgical Procedure(s):    Case: [id number] Date/Time: 03/27/23 1122   Procedure: COMPUTER ASSISTED TOTAL KNEE ARTHROPLASTY (Right: Knee) - SECOND CASE   Anesthesia type: Choice   Pre-op diagnosis: PRIMARY OSTEOARTHRITIS OF RIGHT KNEE.   Location: [facility] OR ROOM 01 / [facility]S FOR AN[facility]   Surgeons: Donato Heinz, MD     NOTE: Available PAT nursing documentation and vital signs have been reviewed. Clinical nursing staff has updated patient's PMH/PSHx, current medication list, and drug allergies/intolerances to ensure comprehensive history available to assist in medical decision making as it pertains to the aforementioned surgical procedure and anticipated anesthetic course. Extensive review of available clinical information personally performed. [facility] [facility] and PSHx updated with any diagnoses/procedures that  may have been inadvertently omitted during his intake with the pre-admission testing department's nursing staff.  Clinical Discussion:  Tyrone Luna is a 77 y.o. male who is submitted for pre-surgical anesthesia review and clearance prior to him undergoing the above procedure. Patient is a Former Smoker (1.5 pack years). Pertinent PMH includes: CAD, atrial fibrillation/flutter, complete heart block (s/p leadless PPM placement), CVA, diastolic dysfunction, PVCs, NSVT, RBBB, aortic atherosclerosis, vasovagal syncope, HTN, HLD, DOE, GERD (on daily PPI), OSAH (s/p UPPP; does not require nocturnal PAP therapy), OA, lumbar DDD, BPH  Patient is followed by cardiology Darrold Junker, MD). He was last seen in the cardiology clinic on 12/04/2022; notes reviewed. At the time of his clinic visit, patient doing well overall from a cardiovascular perspective.  Patient with  chronic lower extremity edema that is reportedly stable and at baseline.  He does not wear compression stockings as directed.  Patient denied any chest pain, shortness of breath, PND, orthopnea, palpitations,  weakness, fatigue, vertiginous symptoms, or presyncope/syncope. Patient with a past medical history significant for cardiovascular diagnoses. Documented physical exam was grossly benign, providing no evidence of acute exacerbation and/or decompensation of the patient's known cardiovascular conditions.  Most recent TTE was performed on 02/27/2021 revealing a normal left ventricular systolic function with an EF of 60 to 65%.  There was mild LVH. Left ventricular diastolic Doppler parameters consistent with abnormal relaxation (G1DD).  Right ventricle was mildly enlarged with normal systolic function.  PASP normal.  There was mild biatrial enlargement.  Trivial mitral valve regurgitation noted.  All transvalvular gradients were noted to be normal providing no evidence suggestive of valvular stenosis.  Aorta normal in size with no evidence of aneurysmal dilatation.  MRI imaging of the brain performed on 02/26/2021 revealed chronic RIGHT frontal and cerebellar infarcts.  Patient has no significant neurological deficits status post neurological event.  Long-term cardiac event monitor study performed on 02/28/2021 revealed 3 runs of nonsustained ventricular tachycardia.  Despite the noted cardiac events, patient remains grossly asymptomatic.  Patient with a history of complete heart block.  He underwent placement of a leadless Medtronic Micra AV pacemaker on 03/22/2021.  Implanted cardiac device is routinely monitored by patient's primary cardiology/electrophysiology team.  Device most recently interrogated on 10/02/2022, at which time it was noted to be functioning properly.  Patient underwent diagnostic LEFT heart catheterization on 03/29/2021.  Study revealed a mildly reduced left ventricular systolic  function with an EF of 40-45%.  There was multivessel CAD; 75% proximal RCA, 30% distal RCA, 30% RPAV, 40% ostial LM, 50% distal LAD,  50% mid to distal LM, 40% OM2, 60% ostial LCx, and proximal 70% proximal LCx.  Interventional cardiology made the decision to defer cardiac intervention opting for medical management.  Patient with an atrial fibrillation diagnosis; CHA2DS2-VASc Score = 4 (age x 2, HTN, vascular disease history). His rate and rhythm are currently being maintained on oral metoprolol succinate. He is chronically anticoagulated using apixaban; reported to be compliant with therapy with no evidence or reports of GI bleeding. Blood pressure reasonably controlled on currently prescribed ACEi (lisinopril) and beta-blocker (metoprolol succinate) therapies.  Patient is on simvastatin for his HLD diagnosis and ASCVD prevention.  Patient is not diabetic.  He does have an OSAH diagnosis, however he no longer requires the use of nocturnal PAP therapy status post UPPP.  Per his baseline, patient is not very active.  He does not have a formal exercise regimen.  Functional capacity limited by patient's age, chronic radicular back pain, and other multiple medical comorbidities.  With that being said, patient still felt to be able to achieve at least 4 METS of physical activity without experiencing any significant degrees of angina/anginal equivalent symptoms.  No changes were made to his medication regimen.  Patient to follow-up with outpatient cardiology in 6 months or sooner if needed.  Tyrone Luna is scheduled for an elective COMPUTER ASSISTED TOTAL KNEE ARTHROPLASTY (Right: Knee) on 03/27/2023 with Dr. Francesco Sor, MD.  Given patient's past medical history significant for cardiovascular diagnoses, presurgical cardiac clearance was sought by the PAT team. Per cardiology, "this patient is optimized for surgery and may proceed with the planned procedural course with a MODERATE risk of significant  perioperative cardiovascular complications".  Again, this patient is on daily oral anticoagulation using a DOAC medication. He is also on daily low dose ASA.  He has been instructed on recommendations for holding his apixaban for 2 days (last dose 03/24/2023) and his ASA for 5 days (last dose 03/21/2023) prior to his procedure with plans to restart as soon as postoperative bleeding risk felt to be minimized by his attending surgeon.   Patient denies previous perioperative complications with anesthesia in the past. In review of the available records, it is noted that patient underwent a MAC anesthetic course at [facility] ([facility]) in 03/2021 without documented complications.      03/18/2023    2:00 PM 02/23/2022    1:15 AM 02/22/2022    8:45 PM  Vitals with BMI  Height 5' 9.02"    Weight 230 lbs 11 oz    BMI 34.05    Systolic 112 170 353  Diastolic 78 90 101  Pulse  66 76    Providers/Specialists:   NOTE: Primary physician provider listed below. Patient may have been seen by APP or partner within same practice.   PROVIDER ROLE / SPECIALTY LAST OV  Hooten, Illene Labrador, MD Orthopedics (Surgeon) 09/07/2022  Marguarite Arbour, MD Primary Care Provider 01/11/2023  Marcina Millard, MD Cardiology 12/04/2022  Merri Ray, MD Physiatry 02/22/2023   Allergies:  Patient has no known allergies.  Current Home Medications:   No current facility-administered medications for this encounter.    apixaban (ELIQUIS) 5 MG TABS tablet   metoprolol succinate (TOPROL-XL) 50 MG 24 hr tablet   ascorbic acid (VITAMIN C) 1000 MG tablet   aspirin 81 MG tablet   Cyanocobalamin (VITAMIN B-12 PO)   dutasteride (AVODART) 0.5 MG capsule   Glucosamine-Chondroit-Vit C-Mn (GLUCOSAMINE 1500 COMPLEX PO)   ipratropium (ATROVENT)  0.06 % nasal spray   lisinopril (ZESTRIL) 20 MG tablet   loratadine (CLARITIN) 10 MG tablet   meclizine (ANTIVERT) 12.5 MG tablet   Multiple  Vitamins-Minerals (PRESERVISION AREDS 2 PO)   ondansetron (ZOFRAN-ODT) 4 MG disintegrating tablet   pantoprazole (PROTONIX) 40 MG tablet   pilocarpine (SALAGEN) 5 MG tablet   simvastatin (ZOCOR) 20 MG tablet   History:   Past Medical History:  Diagnosis Date   Aortic atherosclerosis (HCC)    Arthritis    Atrial fibrillation/flutter (HCC)    a.) CHA2DS2VASc = 4 (age x2, HTN, vascular disease history);  b.) rate/rhythm maintained on oral metoprolol succinate; on chronic oral anticoagulation using apixaban   Basal cell carcinoma of skin    BPH associated with nocturia    CAD (coronary artery disease) 03/29/2021   a.) LHC 03/29/2021: 75% pRCA, 30% dRCA, 30% RPAV, 40% oLM, 50% dLAD, 50% m-dLM, 40% OM2, 60% oLCx, 70% pLCx --> med mgmt   Complete heart block (HCC)    a.) s/p Medtronic leadless PPM placement 03/22/2021   CVA (cerebral vascular accident) [facility])    a.) noted on MRI brain 02/26/2021: chronic RIGHT frontal infarct; small chronic RIGHT cerebellar infarct   DDD (degenerative disc disease), lumbar    Diastolic dysfunction 02/27/2021   a.) TTE 02/27/2021: EF 60-65%, mild LVH, mild RVE, mild BAE, triv MR, G1DD   DOE (dyspnea on exertion)    ED (erectile dysfunction)    Erectile dysfunction    Family history of adverse reaction to anesthesia    a.) PONV in 1st degree relative (sister)   GERD (gastroesophageal reflux disease)    Herniated nucleus pulposus, lumbar    Hyperlipemia    Hypertension    Long term current use of anticoagulant    a.) apixaban   Long-term use of aspirin therapy    Lumbar radiculitis    Male hypogonadism    NSVT (nonsustained ventricular tachycardia) (HCC) 02/28/2021   a.) Holter 02/28/2021: 3 runs NSVT (asymptomatic)   PVC (premature ventricular contraction)    RBBB    Sleep apnea    a.) s/p UPPP; no longer requires use of nocturnal PAP therapy   Squamous cell skin cancer    Status post placement of leadless cardiac pacemaker 03/22/2021   a.)  Medtronic [place] AV (SN: [id number])   Vasovagal syncope    Past Surgical History:  Procedure Laterality Date   ABDOMINAL WALL DEFECT REPAIR  10/13/2019   Procedure: REPAIR ABDOMINAL WALL (ABDOMINAL WALL RECONSTRUCTION WITH MESH);  Surgeon: Leafy Ro, MD;  Location: [facility] ORS;  Service: General;;   APPENDECTOMY  10/13/2019   Procedure: APPENDECTOMY;  Surgeon: Leafy Ro, MD;  Location: [facility] ORS;  Service: General;;   APPLICATION OF WOUND VAC  10/13/2019   Procedure: APPLICATION OF WOUND VAC;  Surgeon: Leafy Ro, MD;  Location: [facility] ORS;  Service: General;;  SERIAL NUMBER: [id number]   COLONOSCOPY WITH PROPOFOL N/A 07/17/2019   Procedure: COLONOSCOPY WITH PROPOFOL;  Surgeon: Wyline Mood, MD;  Location: [facility] ENDOSCOPY;  Service: Gastroenterology;  Laterality: N/A;   COLOSTOMY REVERSAL N/A 10/13/2019   Procedure: COLOSTOMY REVERSAL-colostomy takedown-open;  Surgeon: Leafy Ro, MD;  Location: [facility] ORS;  Service: General;  Laterality: N/A;   EYE SURGERY Bilateral 2002   laser   INGUINAL HERNIA REPAIR Left    KNEE SURGERY Right 1995   LAPAROTOMY N/A 03/15/2019   Procedure: EXPLORATORY LAPAROTOMY,sigmoid colectomy,colostomy;  Surgeon: Leafy Ro, MD;  Location: [facility] ORS;  Service: General;  Laterality:  N/A;   LEFT HEART CATH AND CORONARY ANGIOGRAPHY Left 03/29/2021   Procedure: LEFT HEART CATH AND CORONARY ANGIOGRAPHY;  Surgeon: Marcina Millard, MD;  Location: [facility] INVASIVE CV LAB;  Service: Cardiovascular;  Laterality: Left;   PACEMAKER LEADLESS INSERTION N/A 03/22/2021   Procedure: PACEMAKER LEADLESS INSERTION;  Surgeon: Marcina Millard, MD;  Location: [facility] INVASIVE CV LAB;  Service: Cardiovascular;  Laterality: N/A;   UVULOPALATOPHARYNGOPLASTY (UPPP)/TONSILLECTOMY/SEPTOPLASTY     Family History  Problem Relation Age of Onset   Hypertension Other    Prostate cancer Neg Hx    Bladder Cancer Neg Hx    Kidney cancer Neg Hx    Social History   Tobacco Use   Smoking  status: Former    Current packs/day: 0.50    Average packs/day: 0.5 packs/day for 3.0 years (1.5 ttl pk-yrs)    Types: Cigarettes   Smokeless tobacco: Never   Tobacco comments:    quit 50 years ago  Vaping Use   Vaping status: Never Used  Substance Use Topics   Alcohol use: No    Alcohol/week: 0.0 standard drinks of alcohol   Drug use: No    Pertinent Clinical Results:  LABS:   No visits with results within 3 Day(s) from this visit.  Latest known visit with results is:  Hospital Outpatient Visit on 03/18/2023  Component Date Value Ref Range Status   CRP 03/18/2023 1.1 (H)  <1.0 mg/dL Final   Performed at [facility], [address] [place], [place] [address]   Sed Rate 03/18/2023 2  0 - 20 mm/hr Final   Performed at [facility], [address] [place], [place] [address]   WBC 03/18/2023 6.2  4.0 - 10.5 K/uL Final   RBC 03/18/2023 5.16  4.22 - 5.81 MIL/uL Final   Hemoglobin 03/18/2023 16.5  13.0 - 17.0 g/dL Final   HCT 09/81/1914 47.2  39.0 - 52.0 % Final   MCV 03/18/2023 91.5  80.0 - 100.0 fL Final   MCH 03/18/2023 32.0  26.0 - 34.0 pg Final   MCHC 03/18/2023 35.0  30.0 - 36.0 g/dL Final   RDW 78/29/5621 12.1  11.5 - 15.5 % Final   Platelets 03/18/2023 164  150 - 400 K/uL Final   nRBC 03/18/2023 0.0  0.0 - 0.2 % Final   Performed at [facility], [address] [place], [place] [address]   Sodium 03/18/2023 137  135 - 145 mmol/L Final   Potassium 03/18/2023 3.7  3.5 - 5.1 mmol/L Final   Chloride 03/18/2023 102  98 - 111 mmol/L Final   CO2 03/18/2023 28  22 - 32 mmol/L Final   Glucose, Bld 03/18/2023 94  70 - 99 mg/dL Final   Glucose reference range applies only to samples taken after fasting for at least 8 hours.   BUN 03/18/2023 21  8 - 23 mg/dL Final   Creatinine, Ser 03/18/2023 1.02  0.61 - 1.24 mg/dL Final   Calcium 78/46/9629 9.1  8.9 - 10.3 mg/dL Final   Total Protein 52/84/1324 6.6  6.5 - 8.1 g/dL Final   Albumin 40/05/2724 4.1  3.5  - 5.0 g/dL Final   AST 36/64/4034 18  15 - 41 U/L Final   ALT 03/18/2023 14  0 - 44 U/L Final   Alkaline Phosphatase 03/18/2023 37 (L)  38 - 126 U/L Final   Total Bilirubin 03/18/2023 1.2  0.3 - 1.2 mg/dL Final   GFR, Estimated 03/18/2023 >60  >60 mL/min Final  Comment: (NOTE) Calculated using the CKD-EPI Creatinine Equation (2021)    Anion gap 03/18/2023 7  5 - 15 Final   Performed at [facility], [address] [place], [place] [address]   Color, Urine 03/18/2023 YELLOW (A)  YELLOW Final   APPearance 03/18/2023 CLEAR (A)  CLEAR Final   Specific Gravity, Urine 03/18/2023 1.020  1.005 - 1.030 Final   pH 03/18/2023 5.0  5.0 - 8.0 Final   Glucose, UA 03/18/2023 NEGATIVE  NEGATIVE mg/dL Final   Hgb urine dipstick 03/18/2023 NEGATIVE  NEGATIVE Final   Bilirubin Urine 03/18/2023 NEGATIVE  NEGATIVE Final   Ketones, ur 03/18/2023 NEGATIVE  NEGATIVE mg/dL Final   Protein, ur 95/28/4132 NEGATIVE  NEGATIVE mg/dL Final   Nitrite 44/08/270 NEGATIVE  NEGATIVE Final   Leukocytes,Ua 03/18/2023 NEGATIVE  NEGATIVE Final   Performed at [facility], [address] [place], [place] [address]   MRSA, PCR 03/18/2023 NEGATIVE  NEGATIVE Final   Staphylococcus aureus 03/18/2023 NEGATIVE  NEGATIVE Final   Comment: (NOTE) The Xpert SA Assay (FDA approved for NASAL specimens in patients 37 years of age and older), is one component of a comprehensive surveillance program. It is not intended to diagnose infection nor to guide or monitor treatment. Performed at [facility], [address] [place], [place] [address]    ABO/RH(D) 03/18/2023 A POS   Final   Antibody Screen 03/18/2023 NEG   Final   Sample Expiration 03/18/2023 04/01/2023,2359   Final   Extend sample reason 03/18/2023    Final                   Value:NO TRANSFUSIONS OR PREGNANCY IN THE PAST 3 MONTHS Performed at [facility], [address] [place], [place] [address]     ECG: Date:  03/18/2023 Time ECG obtained: 1525 PM Rate: 65 bpm Rhythm:  Ventricular paced rhythm Axis (leads I and aVF): Normal Intervals: QRS 192 ms. QTc 484 ms. ST segment and T wave changes: No evidence of acute ST segment elevation or depression.  Comparison: Similar to previous tracing obtained on 02/22/2022   IMAGING / PROCEDURES: CT HEAD WO CONTRAST performed on 01/24/2022 No acute CT finding.  Chronic small-vessel ischemic changes of the cerebral hemispheric white matter  CT ABDOMEN PELVIS W CONTRAST performed on 09/19/2021 No acute abnormality seen in the abdomen or pelvis. Aortic atherosclerosis   LEFT HEART CATHETERIZATION AND CORONARY ANGIOGRAPHY performed on 03/29/2021 Mildly reduced left ventricular systolic function with an EF of 45-50% Multivessel CAD Prox RCA lesion is 75% stenosed. Dist RCA lesion is 30% stenosed. RPAV lesion is 30% stenosed. Ost LM lesion is 40% stenosed. Dist LAD lesion is 50% stenosed. Mid LM to Dist LM lesion is 50% stenosed. 2nd Mrg lesion is 40% stenosed. Prox Cx lesion is 70% stenosed. Ost Cx lesion is 60% stenosed. Recommendations Add metoprolol succinate 50 mg daily Consider functional study to assess for ischemia Start Eliquis 5 mg twice daily outpatient    TRANSTHORACIC ECHOCARDIOGRAM performed on 02/27/2021 Left ventricular ejection fraction, by estimation, is 60 to 65%. The left ventricle has normal function. The left ventricle has no regional  wall motion abnormalities. There is mild left ventricular hypertrophy. Left ventricular diastolic parameters are consistent with Grade I diastolic dysfunction (impaired relaxation).  Right ventricular systolic function is normal. The right ventricular size is mildly enlarged. There is normal pulmonary artery systolic pressure.  Left atrial size was mildly dilated.  Right atrial size was mildly dilated.  The  mitral valve was not well visualized. Trivial mitral valve regurgitation.  The aortic valve  was not well visualized. Aortic valve regurgitation is not visualized.   MR BRAIN WO CONTRAST performed on 02/26/2021 No evidence of recent infarction, hemorrhage, or mass. Mild chronic microvascular ischemic changes.  Small chronic right frontal infarct.  New small chronic right cerebellar infarct.  Impression and Plan:  Tyrone Luna has been referred for pre-anesthesia review and clearance prior to him undergoing the planned anesthetic and procedural courses. Available labs, pertinent testing, and imaging results were personally reviewed by me in preparation for upcoming operative/procedural course. [facility] medical record has been updated following extensive record review and patient interview with PAT staff.   This patient has been appropriately cleared by cardiology with an overall MODERATE risk of experiencing significant perioperative cardiovascular complications.  Completed perioperative prescription for cardiac device management documentation completed by primary cardiology team and placed on patient's chart for review by the surgical/anesthetic team on the day of his procedure. Electrophysiology indicating that procedure should not interfere with planned surgical procedure. Beyond normal perioperative cardiovascular monitoring, there are no recommendations from electrophysiology team that prompt further discussion/recommendations from industry representative.   Based on clinical review performed today (03/22/23), barring any significant acute changes in the patient's overall condition, it is anticipated that he will be able to proceed with the planned surgical intervention. Any acute changes in clinical condition may necessitate his procedure being postponed and/or cancelled. Patient will meet with anesthesia team (MD and/or CRNA) on the day of his procedure for preoperative evaluation/assessment. Questions regarding anesthetic course will be fielded at that time.   Pre-surgical  instructions were reviewed with the patient during his PAT appointment, and questions were fielded to satisfaction by PAT clinical staff. He has been instructed on which medications that he will need to hold prior to surgery, as well as the ones that have been deemed safe/appropriate to take on the day of his procedure. As part of the general education provided by PAT, patient made aware both verbally and in writing, that he would need to abstain from the use of any illegal substances during his perioperative course.  He was advised that failure to follow the provided instructions could necessitate case cancellation or result in serious perioperative complications up to and including death. Patient encouraged to contact PAT and/or his surgeon's office to discuss any questions or concerns that may arise prior to surgery; verbalized understanding.   Quentin Mulling, MSN, APRN, FNP-C, C[facility]  Peri-operative Services Nurse Practitioner Phone: [phone] Fax: [phone] 03/22/23 2:39 PM  NOTE: This note has been prepared using Dragon dictation software. Despite my best ability to proofread, there is always the potential that unintentional transcriptional errors may still occur from this process.

## 2023-03-26 MED ORDER — TRANEXAMIC ACID-NACL 1000-0.7 MG/100ML-% IV SOLN
1000.0000 mg | INTRAVENOUS | Status: AC
  Administered 2023-03-27: 1000 mg via INTRAVENOUS

## 2023-03-26 MED ORDER — ORAL CARE MOUTH RINSE: 15.0000 mL | Freq: Once | OROMUCOSAL | Status: AC

## 2023-03-26 MED ORDER — GABAPENTIN 300 MG PO CAPS
300.0000 mg | ORAL_CAPSULE | Freq: Once | ORAL | Status: AC
  Administered 2023-03-27: 300 mg via ORAL

## 2023-03-26 MED ORDER — CEFAZOLIN SODIUM-DEXTROSE 2-4 GM/100ML-% IV SOLN
2.0000 g | INTRAVENOUS | Status: AC
  Administered 2023-03-27: 2 g via INTRAVENOUS

## 2023-03-26 MED ORDER — LACTATED RINGERS IV SOLN: INTRAVENOUS | Status: DC

## 2023-03-26 MED ORDER — CELECOXIB 200 MG PO CAPS
400.0000 mg | ORAL_CAPSULE | Freq: Once | ORAL | Status: AC
  Administered 2023-03-27: 400 mg via ORAL

## 2023-03-26 MED ORDER — DEXAMETHASONE SODIUM PHOSPHATE 10 MG/ML IJ SOLN
8.0000 mg | Freq: Once | INTRAMUSCULAR | Status: AC
  Administered 2023-03-27: 8 mg via INTRAVENOUS

## 2023-03-26 MED ORDER — CHLORHEXIDINE GLUCONATE 0.12 % MT SOLN
15.0000 mL | Freq: Once | OROMUCOSAL | Status: AC
  Administered 2023-03-27: 15 mL via OROMUCOSAL

## 2023-03-26 MED ORDER — CHLORHEXIDINE GLUCONATE 4 % EX SOLN: 60.0000 mL | Freq: Once | CUTANEOUS | Status: DC

## 2023-03-27 ENCOUNTER — Other Ambulatory Visit: Payer: Self-pay

## 2023-03-27 ENCOUNTER — Encounter: Payer: Self-pay | Admitting: Orthopedic Surgery

## 2023-03-27 ENCOUNTER — Observation Stay: Payer: Medicare HMO

## 2023-03-27 ENCOUNTER — Observation Stay
Admission: RE | Admit: 2023-03-27 | Discharge: 2023-03-28 | Disposition: A | Discharge status: Home / Self Care | Payer: Medicare HMO | Attending: Orthopedic Surgery | Admitting: Orthopedic Surgery

## 2023-03-27 ENCOUNTER — Ambulatory Visit: Payer: Medicare HMO | Admitting: Urgent Care

## 2023-03-27 ENCOUNTER — Encounter
Admission: RE | Disposition: A | Discharge status: Home / Self Care | Payer: Self-pay | Source: Home / Self Care | Attending: Orthopedic Surgery

## 2023-03-27 DIAGNOSIS — Z7901 Long term (current) use of anticoagulants: Secondary | ICD-10-CM | POA: Diagnosis not present

## 2023-03-27 DIAGNOSIS — N179 Acute kidney failure, unspecified: Secondary | ICD-10-CM

## 2023-03-27 DIAGNOSIS — Z7982 Long term (current) use of aspirin: Secondary | ICD-10-CM | POA: Diagnosis not present

## 2023-03-27 DIAGNOSIS — Z79899 Other long term (current) drug therapy: Secondary | ICD-10-CM | POA: Diagnosis not present

## 2023-03-27 DIAGNOSIS — Z96659 Presence of unspecified artificial knee joint: Secondary | ICD-10-CM

## 2023-03-27 DIAGNOSIS — S82009A Unspecified fracture of unspecified patella, initial encounter for closed fracture: Secondary | ICD-10-CM | POA: Insufficient documentation

## 2023-03-27 DIAGNOSIS — I10 Essential (primary) hypertension: Secondary | ICD-10-CM | POA: Diagnosis not present

## 2023-03-27 DIAGNOSIS — M1711 Unilateral primary osteoarthritis, right knee: Principal | ICD-10-CM | POA: Insufficient documentation

## 2023-03-27 DIAGNOSIS — E876 Hypokalemia: Secondary | ICD-10-CM

## 2023-03-27 HISTORY — DX: Squamous cell carcinoma of skin, unspecified: C44.92

## 2023-03-27 HISTORY — DX: Basal cell carcinoma of skin, unspecified: C44.91

## 2023-03-27 HISTORY — DX: Unilateral primary osteoarthritis, right knee: M17.11

## 2023-03-27 HISTORY — DX: Unspecified right bundle-branch block: I45.10

## 2023-03-27 HISTORY — DX: Other forms of dyspnea: R06.09

## 2023-03-27 HISTORY — DX: Unspecified atrial fibrillation: I48.91

## 2023-03-27 HISTORY — PX: KNEE ARTHROPLASTY: SHX992

## 2023-03-27 HISTORY — DX: Cerebral infarction, unspecified: I63.9

## 2023-03-27 HISTORY — DX: Long term (current) use of anticoagulants: Z79.01

## 2023-03-27 HISTORY — DX: Syncope and collapse: R55

## 2023-03-27 HISTORY — DX: Atherosclerosis of aorta: I70.0

## 2023-03-27 HISTORY — DX: Long term (current) use of aspirin: Z79.82

## 2023-03-27 HISTORY — DX: Gastro-esophageal reflux disease without esophagitis: K21.9

## 2023-03-27 HISTORY — DX: Ventricular premature depolarization: I49.3

## 2023-03-27 SURGERY — ARTHROPLASTY, KNEE, TOTAL, USING IMAGELESS COMPUTER-ASSISTED NAVIGATION
Anesthesia: Spinal | Site: Knee | Laterality: Right

## 2023-03-27 MED ORDER — ACETAMINOPHEN 10 MG/ML IV SOLN: 1000.0000 mg | Freq: Once | INTRAVENOUS | Status: DC | PRN

## 2023-03-27 MED ORDER — ALUM & MAG HYDROXIDE-SIMETH 200-200-20 MG/5ML PO SUSP: 30.0000 mL | ORAL | Status: DC | PRN

## 2023-03-27 MED ORDER — LORATADINE 10 MG PO TABS
ORAL_TABLET | ORAL | Status: AC
  Filled 2023-03-27: qty 1

## 2023-03-27 MED ORDER — ACETAMINOPHEN 10 MG/ML IV SOLN
INTRAVENOUS | Status: DC | PRN
  Administered 2023-03-27: 1000 mg via INTRAVENOUS

## 2023-03-27 MED ORDER — IPRATROPIUM BROMIDE 0.06 % NA SOLN: 2.0000 | Freq: Three times a day (TID) | NASAL | Status: DC | PRN

## 2023-03-27 MED ORDER — DIPHENHYDRAMINE HCL 12.5 MG/5ML PO ELIX: 12.5000 mg | ORAL_SOLUTION | ORAL | Status: DC | PRN

## 2023-03-27 MED ORDER — METOCLOPRAMIDE HCL 10 MG PO TABS
10.0000 mg | ORAL_TABLET | Freq: Three times a day (TID) | ORAL | Status: DC
  Administered 2023-03-27 – 2023-03-28 (×3): 10 mg via ORAL

## 2023-03-27 MED ORDER — TRANEXAMIC ACID-NACL 1000-0.7 MG/100ML-% IV SOLN
INTRAVENOUS | Status: AC
  Filled 2023-03-27: qty 100

## 2023-03-27 MED ORDER — ACETAMINOPHEN 325 MG PO TABS: 325.0000 mg | ORAL_TABLET | Freq: Four times a day (QID) | ORAL | Status: DC | PRN

## 2023-03-27 MED ORDER — CELECOXIB 200 MG PO CAPS
ORAL_CAPSULE | ORAL | Status: AC
  Filled 2023-03-27: qty 2

## 2023-03-27 MED ORDER — ONDANSETRON HCL 4 MG/2ML IJ SOLN: 4.0000 mg | Freq: Once | INTRAMUSCULAR | Status: DC | PRN

## 2023-03-27 MED ORDER — BUPIVACAINE HCL (PF) 0.5 % IJ SOLN
INTRAMUSCULAR | Status: DC | PRN
  Administered 2023-03-27: 3 mL

## 2023-03-27 MED ORDER — CEFAZOLIN SODIUM-DEXTROSE 2-4 GM/100ML-% IV SOLN
2.0000 g | Freq: Four times a day (QID) | INTRAVENOUS | Status: AC
  Administered 2023-03-27 – 2023-03-28 (×2): 2 g via INTRAVENOUS

## 2023-03-27 MED ORDER — PROPOFOL 500 MG/50ML IV EMUL
INTRAVENOUS | Status: DC | PRN
  Administered 2023-03-27: 50 ug/kg/min via INTRAVENOUS

## 2023-03-27 MED ORDER — CEFAZOLIN SODIUM-DEXTROSE 2-4 GM/100ML-% IV SOLN
INTRAVENOUS | Status: AC
  Filled 2023-03-27: qty 100

## 2023-03-27 MED ORDER — FENTANYL CITRATE (PF) 100 MCG/2ML IJ SOLN
INTRAMUSCULAR | Status: DC | PRN
  Administered 2023-03-27: 50 ug via INTRAVENOUS

## 2023-03-27 MED ORDER — SODIUM CHLORIDE 0.9 % IR SOLN
Status: DC | PRN
  Administered 2023-03-27: 3000 mL

## 2023-03-27 MED ORDER — APIXABAN 2.5 MG PO TABS
5.0000 mg | ORAL_TABLET | Freq: Two times a day (BID) | ORAL | Status: DC
  Administered 2023-03-28: 5 mg via ORAL

## 2023-03-27 MED ORDER — ACETAMINOPHEN 10 MG/ML IV SOLN
1000.0000 mg | Freq: Four times a day (QID) | INTRAVENOUS | Status: DC
  Administered 2023-03-27 – 2023-03-28 (×3): 1000 mg via INTRAVENOUS

## 2023-03-27 MED ORDER — FENTANYL CITRATE (PF) 100 MCG/2ML IJ SOLN
INTRAMUSCULAR | Status: AC
  Filled 2023-03-27: qty 2

## 2023-03-27 MED ORDER — OXYCODONE HCL 5 MG PO TABS
5.0000 mg | ORAL_TABLET | ORAL | Status: DC | PRN
  Administered 2023-03-28: 5 mg via ORAL

## 2023-03-27 MED ORDER — SENNOSIDES-DOCUSATE SODIUM 8.6-50 MG PO TABS
1.0000 | ORAL_TABLET | Freq: Two times a day (BID) | ORAL | Status: DC
  Administered 2023-03-27 – 2023-03-28 (×2): 1 via ORAL

## 2023-03-27 MED ORDER — OXYCODONE HCL 5 MG PO TABS: 5.0000 mg | ORAL_TABLET | Freq: Once | ORAL | Status: DC | PRN

## 2023-03-27 MED ORDER — ACETAMINOPHEN 10 MG/ML IV SOLN
INTRAVENOUS | Status: AC
  Filled 2023-03-27: qty 100

## 2023-03-27 MED ORDER — PANTOPRAZOLE SODIUM 40 MG PO TBEC
40.0000 mg | DELAYED_RELEASE_TABLET | Freq: Two times a day (BID) | ORAL | Status: DC
  Administered 2023-03-27 – 2023-03-28 (×2): 40 mg via ORAL

## 2023-03-27 MED ORDER — BISACODYL 10 MG RE SUPP: 10.0000 mg | Freq: Every day | RECTAL | Status: DC | PRN

## 2023-03-27 MED ORDER — FERROUS SULFATE 325 (65 FE) MG PO TABS
325.0000 mg | ORAL_TABLET | Freq: Two times a day (BID) | ORAL | Status: DC
  Administered 2023-03-27 – 2023-03-28 (×2): 325 mg via ORAL

## 2023-03-27 MED ORDER — FERROUS SULFATE 325 (65 FE) MG PO TABS
ORAL_TABLET | ORAL | Status: AC
  Filled 2023-03-27: qty 1

## 2023-03-27 MED ORDER — METOCLOPRAMIDE HCL 10 MG PO TABS
ORAL_TABLET | ORAL | Status: AC
  Filled 2023-03-27: qty 1

## 2023-03-27 MED ORDER — SODIUM CHLORIDE 0.9 % IV SOLN: INTRAVENOUS | Status: DC

## 2023-03-27 MED ORDER — FLEET ENEMA 7-19 GM/118ML RE ENEM: 1.0000 | ENEMA | Freq: Once | RECTAL | Status: DC | PRN

## 2023-03-27 MED ORDER — ENSURE PRE-SURGERY PO LIQD
296.0000 mL | Freq: Once | ORAL | Status: DC
  Filled 2023-03-27: qty 296

## 2023-03-27 MED ORDER — SENNOSIDES-DOCUSATE SODIUM 8.6-50 MG PO TABS
ORAL_TABLET | ORAL | Status: AC
  Filled 2023-03-27: qty 1

## 2023-03-27 MED ORDER — CELECOXIB 200 MG PO CAPS
ORAL_CAPSULE | ORAL | Status: AC
  Filled 2023-03-27: qty 1

## 2023-03-27 MED ORDER — LISINOPRIL 20 MG PO TABS
20.0000 mg | ORAL_TABLET | Freq: Every day | ORAL | Status: DC
  Administered 2023-03-27 – 2023-03-28 (×2): 20 mg via ORAL

## 2023-03-27 MED ORDER — DEXAMETHASONE SODIUM PHOSPHATE 10 MG/ML IJ SOLN
INTRAMUSCULAR | Status: AC
  Filled 2023-03-27: qty 1

## 2023-03-27 MED ORDER — METOPROLOL SUCCINATE ER 25 MG PO TB24
50.0000 mg | ORAL_TABLET | Freq: Every day | ORAL | Status: DC
  Administered 2023-03-28: 50 mg via ORAL
  Filled 2023-03-27: qty 2

## 2023-03-27 MED ORDER — BUPIVACAINE HCL (PF) 0.5 % IJ SOLN
INTRAMUSCULAR | Status: AC
  Filled 2023-03-27: qty 10

## 2023-03-27 MED ORDER — ONDANSETRON HCL 4 MG PO TABS: 4.0000 mg | ORAL_TABLET | Freq: Four times a day (QID) | ORAL | Status: DC | PRN

## 2023-03-27 MED ORDER — OXYCODONE HCL 5 MG/5ML PO SOLN: 5.0000 mg | Freq: Once | ORAL | Status: DC | PRN

## 2023-03-27 MED ORDER — MECLIZINE HCL 25 MG PO TABS: 12.5000 mg | ORAL_TABLET | Freq: Three times a day (TID) | ORAL | Status: DC | PRN

## 2023-03-27 MED ORDER — SIMVASTATIN 20 MG PO TABS
20.0000 mg | ORAL_TABLET | Freq: Every evening | ORAL | Status: DC
  Administered 2023-03-27: 20 mg via ORAL
  Filled 2023-03-27: qty 1

## 2023-03-27 MED ORDER — CHLORHEXIDINE GLUCONATE 0.12 % MT SOLN
OROMUCOSAL | Status: AC
  Filled 2023-03-27: qty 15

## 2023-03-27 MED ORDER — PANTOPRAZOLE SODIUM 40 MG PO TBEC
DELAYED_RELEASE_TABLET | ORAL | Status: AC
  Filled 2023-03-27: qty 1

## 2023-03-27 MED ORDER — ASPIRIN 81 MG PO CHEW
81.0000 mg | CHEWABLE_TABLET | Freq: Every day | ORAL | Status: DC
  Administered 2023-03-28: 81 mg via ORAL

## 2023-03-27 MED ORDER — FENTANYL CITRATE (PF) 100 MCG/2ML IJ SOLN: 25.0000 ug | INTRAMUSCULAR | Status: DC | PRN

## 2023-03-27 MED ORDER — MENTHOL 3 MG MT LOZG: 1.0000 | LOZENGE | OROMUCOSAL | Status: DC | PRN

## 2023-03-27 MED ORDER — SODIUM CHLORIDE FLUSH 0.9 % IV SOLN
INTRAVENOUS | Status: AC
  Filled 2023-03-27: qty 40

## 2023-03-27 MED ORDER — SURGIPHOR WOUND IRRIGATION SYSTEM - OPTIME: TOPICAL | Status: DC | PRN

## 2023-03-27 MED ORDER — GABAPENTIN 300 MG PO CAPS
ORAL_CAPSULE | ORAL | Status: AC
  Filled 2023-03-27: qty 1

## 2023-03-27 MED ORDER — LORATADINE 10 MG PO TABS
10.0000 mg | ORAL_TABLET | Freq: Every day | ORAL | Status: DC
  Administered 2023-03-27 – 2023-03-28 (×2): 10 mg via ORAL

## 2023-03-27 MED ORDER — PILOCARPINE HCL 5 MG PO TABS
5.0000 mg | ORAL_TABLET | Freq: Two times a day (BID) | ORAL | Status: DC
  Administered 2023-03-27 – 2023-03-28 (×2): 5 mg via ORAL
  Filled 2023-03-27 (×2): qty 1

## 2023-03-27 MED ORDER — MAGNESIUM HYDROXIDE 400 MG/5ML PO SUSP: 30.0000 mL | Freq: Every day | ORAL | Status: DC

## 2023-03-27 MED ORDER — LISINOPRIL 20 MG PO TABS
ORAL_TABLET | ORAL | Status: AC
  Filled 2023-03-27: qty 1

## 2023-03-27 MED ORDER — ONDANSETRON HCL 4 MG/2ML IJ SOLN: 4.0000 mg | Freq: Four times a day (QID) | INTRAMUSCULAR | Status: DC | PRN

## 2023-03-27 MED ORDER — HYDROMORPHONE HCL 1 MG/ML IJ SOLN: 0.5000 mg | INTRAMUSCULAR | Status: DC | PRN

## 2023-03-27 MED ORDER — PHENOL 1.4 % MT LIQD: 1.0000 | OROMUCOSAL | Status: DC | PRN

## 2023-03-27 MED ORDER — TRANEXAMIC ACID-NACL 1000-0.7 MG/100ML-% IV SOLN
1000.0000 mg | Freq: Once | INTRAVENOUS | Status: AC
  Administered 2023-03-27: 1000 mg via INTRAVENOUS

## 2023-03-27 MED ORDER — TRAMADOL HCL 50 MG PO TABS: 50.0000 mg | ORAL_TABLET | ORAL | Status: DC | PRN

## 2023-03-27 MED ORDER — PROPOFOL 1000 MG/100ML IV EMUL
INTRAVENOUS | Status: AC
  Filled 2023-03-27: qty 100

## 2023-03-27 MED ORDER — OXYCODONE HCL 5 MG PO TABS: 10.0000 mg | ORAL_TABLET | ORAL | Status: DC | PRN

## 2023-03-27 MED ORDER — DUTASTERIDE 0.5 MG PO CAPS
0.5000 mg | ORAL_CAPSULE | Freq: Every evening | ORAL | Status: DC
  Administered 2023-03-27: 0.5 mg via ORAL
  Filled 2023-03-27: qty 1

## 2023-03-27 MED ORDER — CELECOXIB 200 MG PO CAPS
200.0000 mg | ORAL_CAPSULE | Freq: Two times a day (BID) | ORAL | Status: DC
  Administered 2023-03-27 – 2023-03-28 (×2): 200 mg via ORAL

## 2023-03-27 MED ORDER — SODIUM CHLORIDE (PF) 0.9 % IJ SOLN
INTRAMUSCULAR | Status: DC | PRN
  Administered 2023-03-27: 120 mL via INTRAMUSCULAR

## 2023-03-27 SURGICAL SUPPLY — 79 items
ATTUNE MED DOME PAT 41 KNEE (Knees) IMPLANT
ATTUNE PS FEM RT SZ 8 CEM KNEE (Femur) IMPLANT
ATTUNE PSRP INSR SZ8 7 KNEE (Insert) IMPLANT
BASE TIBIAL ROT PLAT SZ 8 KNEE (Knees) IMPLANT
BATTERY INSTRU NAVIGATION (MISCELLANEOUS) ×4 IMPLANT
BLADE CLIPPER SURG (BLADE) IMPLANT
BLADE SAW 70X12.5 (BLADE) ×1 IMPLANT
BLADE SAW 90X13X1.19 OSCILLAT (BLADE) ×1 IMPLANT
BLADE SAW 90X25X1.19 OSCILLAT (BLADE) ×1 IMPLANT
BONE CEMENT GENTAMICIN (Cement) ×2 IMPLANT
BRUSH SCRUB EZ PLAIN DRY (MISCELLANEOUS) ×1 IMPLANT
BSPLAT TIB 8 CMNT ROT PLAT STR (Knees) ×1 IMPLANT
BTRY SRG DRVR LF (MISCELLANEOUS) ×4
CEMENT BONE GENTAMICIN 40 (Cement) IMPLANT
CEMENT HV SMART SET (Cement) IMPLANT
COOLER POLAR GLACIER W/PUMP (MISCELLANEOUS) ×1 IMPLANT
CUFF TOURN SGL QUICK 24 (TOURNIQUET CUFF) ×1
CUFF TOURN SGL QUICK 30 (TOURNIQUET CUFF)
CUFF TRNQT CYL 24X4X16.5-23 (TOURNIQUET CUFF) IMPLANT
CUFF TRNQT CYL 30X4X21-28X (TOURNIQUET CUFF) IMPLANT
DRAPE 3/4 80X56 (DRAPES) ×1 IMPLANT
DRAPE INCISE IOBAN 66X45 STRL (DRAPES) IMPLANT
DRSG AQUACEL AG ADV 3.5X14 (GAUZE/BANDAGES/DRESSINGS) ×1 IMPLANT
DRSG DERMACEA NONADH 3X8 (GAUZE/BANDAGES/DRESSINGS) ×1 IMPLANT
DRSG MEPILEX SACRM 8.7X9.8 (GAUZE/BANDAGES/DRESSINGS) ×1 IMPLANT
DRSG TEGADERM 4X4.75 (GAUZE/BANDAGES/DRESSINGS) ×1 IMPLANT
DURAPREP 26ML APPLICATOR (WOUND CARE) ×2 IMPLANT
ELECT CAUTERY BLADE 6.4 (BLADE) ×1 IMPLANT
ELECT REM PT RETURN 9FT ADLT (ELECTROSURGICAL) ×1
ELECTRODE REM PT RTRN 9FT ADLT (ELECTROSURGICAL) ×1 IMPLANT
EX-PIN ORTHOLOCK NAV 4X150 (PIN) ×2 IMPLANT
GLOVE BIOGEL M STRL SZ7.5 (GLOVE) ×4 IMPLANT
GLOVE SRG 8 PF TXTR STRL LF DI (GLOVE) ×2 IMPLANT
GLOVE SURG UNDER POLY LF SZ8 (GLOVE) ×2
GOWN STRL REUS W/ TWL LRG LVL3 (GOWN DISPOSABLE) ×1 IMPLANT
GOWN STRL REUS W/ TWL XL LVL3 (GOWN DISPOSABLE) ×1 IMPLANT
GOWN STRL REUS W/TWL LRG LVL3 (GOWN DISPOSABLE) ×1
GOWN STRL REUS W/TWL XL LVL3 (GOWN DISPOSABLE) ×1
GOWN TOGA ZIPPER T7+ PEEL AWAY (MISCELLANEOUS) ×1 IMPLANT
HANDLE YANKAUER SUCT OPEN TIP (MISCELLANEOUS) ×1 IMPLANT
HEMOVAC 400CC 10FR (MISCELLANEOUS) ×1 IMPLANT
HOLDER FOLEY CATH W/STRAP (MISCELLANEOUS) ×1 IMPLANT
HOOD PEEL AWAY T7 (MISCELLANEOUS) ×1 IMPLANT
IV NS IRRIG 3000ML ARTHROMATIC (IV SOLUTION) ×1 IMPLANT
KIT TURNOVER KIT A (KITS) ×1 IMPLANT
KNIFE SCULPS 14X20 (INSTRUMENTS) ×1 IMPLANT
MANIFOLD NEPTUNE II (INSTRUMENTS) ×2 IMPLANT
NDL SPNL 20GX3.5 QUINCKE YW (NEEDLE) ×2 IMPLANT
NEEDLE SPNL 20GX3.5 QUINCKE YW (NEEDLE) ×2 IMPLANT
PACK TOTAL KNEE (MISCELLANEOUS) ×1 IMPLANT
PAD ABD DERMACEA PRESS 5X9 (GAUZE/BANDAGES/DRESSINGS) ×2 IMPLANT
PAD ARMBOARD 7.5X6 YLW CONV (MISCELLANEOUS) ×3 IMPLANT
PAD WRAPON POLAR KNEE (MISCELLANEOUS) ×1 IMPLANT
PENCIL SMOKE EVACUATOR COATED (MISCELLANEOUS) ×1 IMPLANT
PIN DRILL FIX HALF THREAD (BIT) ×2 IMPLANT
PIN DRILL QUICK PACK (PIN) ×2 IMPLANT
PIN FIXATION 1/8DIA X 3INL (PIN) ×1 IMPLANT
PULSAVAC PLUS IRRIG FAN TIP (DISPOSABLE) ×1
SOL PREP PVP 2OZ (MISCELLANEOUS) ×1
SOLUTION IRRIG SURGIPHOR (IV SOLUTION) ×1 IMPLANT
SOLUTION PREP PVP 2OZ (MISCELLANEOUS) ×1 IMPLANT
SPONGE DRAIN TRACH 4X4 STRL 2S (GAUZE/BANDAGES/DRESSINGS) ×1 IMPLANT
STAPLER SKIN PROX 35W (STAPLE) ×1 IMPLANT
STOCKINETTE IMPERV 14X48 (MISCELLANEOUS) ×1 IMPLANT
STRAP TIBIA SHORT (MISCELLANEOUS) ×1 IMPLANT
SUCTION TUBE FRAZIER 10FR DISP (SUCTIONS) ×1 IMPLANT
SUT VIC AB 0 CT1 36 (SUTURE) ×1 IMPLANT
SUT VIC AB 1 CT1 36 (SUTURE) ×2 IMPLANT
SUT VIC AB 2-0 CT2 27 (SUTURE) ×1 IMPLANT
SYR 30ML LL (SYRINGE) ×2 IMPLANT
TIBIAL BASE ROT PLAT SZ 8 KNEE (Knees) ×1 IMPLANT
TIP FAN IRRIG PULSAVAC PLUS (DISPOSABLE) ×1 IMPLANT
TOWEL OR 17X26 4PK STRL BLUE (TOWEL DISPOSABLE) IMPLANT
TOWER CARTRIDGE SMART MIX (DISPOSABLE) ×1 IMPLANT
TRAP FLUID SMOKE EVACUATOR (MISCELLANEOUS) ×1 IMPLANT
TRAY FOLEY MTR SLVR 16FR STAT (SET/KITS/TRAYS/PACK) ×1 IMPLANT
TUBING CONNECTING 10 (TUBING) ×2 IMPLANT
WATER STERILE IRR 1000ML POUR (IV SOLUTION) ×1 IMPLANT
WRAPON POLAR PAD KNEE (MISCELLANEOUS) ×1

## 2023-03-27 NOTE — Plan of Care (Signed)
  Problem: Pain Management: Goal: Pain level will decrease with appropriate interventions Outcome: Progressing   Problem: Skin Integrity: Goal: Will show signs of wound healing Outcome: Progressing   

## 2023-03-27 NOTE — Anesthesia Preprocedure Evaluation (Signed)
Anesthesia Evaluation  Patient identified by MRN, date of birth, ID band Patient awake    Reviewed: Allergy & Precautions, NPO status , Patient's Chart, lab work & pertinent test results  History of Anesthesia Complications Negative for: history of anesthetic complications  Airway Mallampati: II  TM Distance: >3 FB Neck ROM: Full    Dental no notable dental hx. 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 on medications + CAD  (-) Past MI + dysrhythmias Atrial Fibrillation + pacemaker  Rhythm:Regular Rate:Normal - Systolic murmurs TTE: 1. Left ventricular ejection fraction, by estimation, is 60 to 65%. The  left ventricle has normal function. The left ventricle has no regional  wall motion abnormalities. There is mild left ventricular hypertrophy.  Left ventricular diastolic parameters  are consistent with Grade I diastolic dysfunction (impaired relaxation).   2. Right ventricular systolic function is normal. The right ventricular  size is mildly enlarged. There is normal pulmonary artery systolic  pressure.   3. Left atrial size was mildly dilated.   4. Right atrial size was mildly dilated.   5. The mitral valve was not well visualized. Trivial mitral valve  regurgitation.   6. The aortic valve was not well visualized. Aortic valve regurgitation  is not visualized.    (EF 45% on left heart cath done around same time)   Neuro/Psych negative neurological ROS  negative psych ROS   GI/Hepatic ,neg GERD  ,,(+)     (-) substance abuse    Endo/Other  neg diabetes    Renal/GU negative Renal ROS     Musculoskeletal  (+) Arthritis ,     Abdominal   Peds  Hematology Patient last took Eliquis Saturday night (over 72 hours ago)   Anesthesia Other Findings Past Medical History: No date: Arthritis No date: BPH associated with nocturia No date: Cancer (HCC)     Comment:  SKIN CA-BASAL AND SQUAMOUS  No date: ED (erectile dysfunction) No date: Erectile dysfunction No date: Family history of adverse reaction to anesthesia     Comment:  SISTER-N/V No date: Hyperlipemia No date: Hypertension 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 No date: Sleep apnea     Comment:  H/O HAD SURGERY-UUU No date: Urinary frequency No date: Urinary urgency  Reproductive/Obstetrics                             Anesthesia Physical Anesthesia Plan  ASA: 3  Anesthesia Plan: General   Post-op Pain Management: Gabapentin PO (pre-op)* and Celebrex PO (pre-op)*   Induction: Intravenous  PONV Risk Score and Plan: 1 and Ondansetron, Dexamethasone and Treatment may vary due to age or medical condition  Airway Management Planned: Natural Airway  Additional Equipment: None  Intra-op Plan:   Post-operative Plan: Extubation in OR  Informed Consent: I have reviewed the patients History and Physical, chart, labs and discussed the procedure including the risks, benefits and alternatives for the proposed anesthesia with the patient or authorized representative who has indicated his/her understanding and acceptance.     Dental advisory given  Plan Discussed with: CRNA and Surgeon  Anesthesia Plan Comments: (Discussed R/B/A of neuraxial anesthesia technique with patient: - rare risks of spinal/epidural hematoma, nerve  damage, infection - Risk of PDPH - Risk of nausea and vomiting - Risk of conversion to general anesthesia and its associated risks, including sore throat, damage to lips/eyes/teeth/oropharynx, and rare risks such as cardiac and respiratory events. - Risk of allergic reactions  Discussed the role of CRNA in  patient's perioperative care.  Patient voiced understanding.)        Anesthesia Quick Evaluation

## 2023-03-27 NOTE — Progress Notes (Signed)
PT Cancellation Note  Patient Details Name: Tyrone Luna MRN: [id number] DOB: [birth year]   Cancelled Treatment:    Reason Eval/Treat Not Completed: Medical issues which prohibited therapy Spoke with nursing at ~1700, pt's spinal block still in effect.  Not appropriate for PT a this time, will plan to initiate therapy tomorrow AM as appropriate.  [name], DPT 03/27/2023, 5:02 PM

## 2023-03-27 NOTE — Anesthesia Procedure Notes (Signed)
Spinal  Patient location during procedure: OR Start time: 03/27/2023 11:45 AM End time: 03/27/2023 11:50 AM Reason for block: surgical anesthesia Staffing Performed: resident/CRNA  Anesthesiologist: Corinda Gubler, MD Resident/CRNA: Berniece Pap, CRNA Performed by: Berniece Pap, CRNA Authorized by: Corinda Gubler, MD   Preanesthetic Checklist Completed: patient identified, IV checked, site marked, risks and benefits discussed, surgical consent, monitors and equipment checked, pre-op evaluation and timeout performed Spinal Block Patient position: sitting Prep: DuraPrep Patient monitoring: heart rate, cardiac monitor, continuous pulse ox and blood pressure Approach: midline Location: L3-4 Injection technique: single-shot Needle Needle type: Sprotte and Pencan  Needle gauge: 24 G Needle length: 9 cm Assessment Sensory level: T4 Events: CSF return

## 2023-03-27 NOTE — Interval H&P Note (Signed)
History and Physical Interval Note:  03/27/2023 11:17 AM  Tyrone Luna  has presented today for surgery, with the diagnosis of PRIMARY OSTEOARTHRITIS OF RIGHT KNEE..  The various methods of treatment have been discussed with the patient and family. After consideration of risks, benefits and other options for treatment, the patient has consented to  Procedure(s) with comments: COMPUTER ASSISTED TOTAL KNEE ARTHROPLASTY (Right) - SECOND CASE as a surgical intervention.  The patient's history has been reviewed, patient examined, no change in status, stable for surgery.  I have reviewed the patient's chart and labs.  Questions were answered to the patient's satisfaction.     Orville Widmann P Attie Nawabi

## 2023-03-27 NOTE — H&P (Signed)
ORTHOPAEDIC HISTORY & PHYSICAL Raenette Rover, [place] - 03/21/2023 8:15 AM EDT Formatting of this note is different from the original. NAME: Tyrone Luna H&P Date: 03/21/2023 Procedure Date: 03/27/2023  Chief Complaint: right knee pain  HPI Tyrone Luna is a 77 y.o. male who has severe knee pain and has failed conservative treatment including injections and activity modification. The pain localizes to the medial aspect of his right knee. He does report intermittent swelling and giving way of the knee. He is unable to utilize oral NSAIDs due to anticoagulation with Eliquis. He does not use any ambulatory aids. He states though that he has had significant interference with his daily life and activities. He has requested operative intervention for relief of his DJD symptoms. 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 He does have a pacemaker in place. He has received clearance by his cardiologist for surgical intervention. He denies having any pulmonary issues. Patient does not have any history of DVT or clot formation. Denies any issues with previous surgeries or anesthesia. He plans to go home postsurgically. Patient also does have a history of an arthroscopic surgery performed on this right knee in the past.  Social Hx: Patient denies any alcohol or tobacco use. He is currently retired.  Medications & Allergies Allergies: No Known Allergies  Home Medicines: Current Outpatient Medications on File Prior to Visit Medication Sig Dispense Refill acetaminophen (TYLENOL) 500 mg capsule Take 2 capsules (1,000 mg total) by mouth every 8 (eight) hours apixaban (ELIQUIS) 5 mg tablet Take 1 tablet (5 mg total) by mouth 2 (two) times daily 60 tablet 11 ascorbic acid, vitamin C, (VITAMIN C) 1000 MG tablet Take 1,000 mg by mouth once daily aspirin 81 MG EC tablet Take 81 mg by mouth once daily. cyanocobalamin (VITAMIN B12) 1000 MCG tablet Take 1,000 mcg by  mouth once daily Patient reports Debbra Riding, PA prescribed in July 2022 cyanocobalamin, vitamin B-12, (CYANOCOBALAMIN,VIT B-12,,BULK,) Powd Take 1,000 mcg by mouth once daily dutasteride (AVODART) 0.5 mg capsule Take 1 capsule (0.5 mg total) by mouth once daily 90 capsule 3 EPINEPHrine (EPIPEN) 0.3 mg/0.3 mL auto-injector INJECT CONTENTS OF 1 PEN AS NEEDED FOR ALLERGIC REACTION glucosamine/chondroitin/C/Mang (GLUCOSAMINE 1500 COMPLEX ORAL) Take by mouth Take 2 tablet daily ipratropium (ATROVENT) 0.06 % nasal spray USE 2 SPRAY(S) IN EACH NOSTRIL THREE TIMES DAILY AS NEEDED lisinopriL (ZESTRIL) 20 MG tablet Take 1 tablet (20 mg total) by mouth once daily 30 tablet 11 meclizine (ANTIVERT) 12.5 mg tablet Take 12.5 mg by mouth metoprolol succinate (TOPROL-XL) 50 MG XL tablet Take 1 tablet (50 mg total) by mouth once daily 30 tablet 11 pantoprazole (PROTONIX) 40 MG DR tablet Take 1 tablet (40 mg total) by mouth 2 (two) times daily before meals 180 tablet 3 pilocarpine (SALAGEN, PILOCARPINE,) 5 mg tablet Take 1 tablet (5 mg total) by mouth 2 (two) times daily 180 tablet 3 simvastatin (ZOCOR) 20 MG tablet Take 1 tablet (20 mg total) by mouth at bedtime 90 tablet 3 triamcinolone 0.5 % cream Apply topically 2 (two) times daily Apply 2 x per day (up to 7-10 days) 30 g 5 vit C/E/Zn/coppr/lutein/zeaxan (PRESERVISION AREDS-2 ORAL) Take by mouth One twice a day scopolamine (TRANSDERM-SCOP) 1.5 mg (1 mg over 3 days) patch Place 1 patch onto the skin every third day. (Patient not taking: Reported on 03/21/2023)  No current facility-administered medications on file prior to visit.  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 They preformed a colonoscopy on him. He is unsure of the name of the doctor. It was done at [facility]. Patient states that he is not due again until 2025. He would like to be re-added to our call back file. EGD 01/28/2018 Gastritis: No repeat per RTE HERNIA REPAIR Left inguinal hernia repair KNEE ARTHROSCOPY THROAT SURGERY   Physical Exam  Ht:175.3 cm (5\' 9" ) Wt:(!) 104.8 kg (231 lb) BMI: Body mass index is 34.11 kg/m.  General/Constitutional: No apparent distress: well-nourished and well developed. Eyes: Pupils equal, round with synchronous movement. Lymphatic: No palpable adenopathy. Respiratory: Patient has good chest rise and fall with inspiration and expiration. Upon auscultation, lungs are clear bilaterally in all lung fields. There are no wheezes, rhonchi or rails appreciated. Cardiovascular: Upon auscultation, patient has an irregularly irregular rhythm and rate noted, consistent with his history of atrial fibrillation. There does not appear to be any heaves, murmurs, rubs or gallops appreciated. Peripheral posterior tibial pulses appreciated bilaterally 2+. Integumentary: No impressive skin lesions present, except as noted in detailed exam. Neuro/Psych: Normal mood and affect, oriented to person, place and time. Musculoskeletal: see right knee exam below  Right knee exam Upon inspection of the right knee, there does not appear to be any skin changes, rashes or open wounds. With palpation, the patient reports having some pain along the medial aspect of the knee, denies having any difficulty or pain anywhere else. Patient has full range of motion, full extension, 132 degrees of flexion. Patient has good strength 5/5 with flexion  and extension at this knee. Some mild grinding appreciated at the patella from deep flexion into extension, tracking well. Varus valgus stress testing shows a stable joint. Anterior, posterior drawer testing negative. Patient is neurovascularly intact down his right lower extremity to all dermatomes. Posterior tibial pulses appreciated 2+.  Imaging right Knee Imaging: A series of images were ordered and interpreted of the patient's right knee including AP, lateral and sunrise views. There is significant narrowing of the medial cartilage space with associated varus alignment. There is osteophyte formation noted. There does not appear to be any fractures, dislocation, lytic lesions or gross deformities appreciated on films.  Assesment and Plan Knee DJD  A series of x-rays were ordered and interpreted to update the patient's imaging prior to surgery.  I have recommended that Tyrone Luna undergo right total knee replacement. Consents has been signed. The risks, benefits, prognosis and alternatives including but not limited to DVT, PE, infection, neurovascular injury, failure of the procedure and death were explained to the patient and he is willing to proceed with surgery as described to him by myself. Plan will be for post operative admission of at least 1 midnight for pain control and PT. He will be managed with DVT prophylaxis, antibiotics preoperatively for 24 hours and aggressive in patient rehab.  Pre, intra and post op interventions were discussed. Patient has good understanding  Medication Reconciliation was performed. Discussed cessation of Eliquis, lisinopril, aspirin, vitamins and supplements.  Patient cleared for surgery  A total of 50 minutes was spent reviewing patient's charts, medical reconciliation, discussing/educating the patient  about surgical interventions, and answering any questions provided by the patient.  JOSHUA Kendrick Fries, PA [facility] 03/21/2023  Electronically signed by Raenette Rover, PA at 03/21/2023 1:17 PM EDT

## 2023-03-27 NOTE — Transfer of Care (Signed)
Immediate Anesthesia Transfer of Care Note  Patient: Tyrone Luna  Procedure(s) Performed: COMPUTER ASSISTED TOTAL KNEE ARTHROPLASTY (Right: Knee)  Patient Location: PACU  Anesthesia Type:Spinal  Level of Consciousness: drowsy and patient cooperative  Airway & Oxygen Therapy: Patient Spontanous Breathing  Post-op Assessment: Report given to RN and Post -op Vital signs reviewed and stable  Post vital signs: Reviewed and stable  Last Vitals:  Vitals Value Taken Time  BP 118/88 03/27/23 1511  Temp 36.1 C 03/27/23 1510  Pulse 61 03/27/23 1513  Resp 15 03/27/23 1513  SpO2 95 % 03/27/23 1513  Vitals shown include unfiled device data.  Last Pain:  Vitals:   03/27/23 0956  TempSrc: Temporal  PainSc: 0-No pain         Complications: No notable events documented.

## 2023-03-27 NOTE — Op Note (Signed)
OPERATIVE NOTE  DATE OF SURGERY:  03/27/2023  PATIENT NAME:  Tyrone Luna   DOB: 11-Oct-1945  MRN: [id number]  PRE-OPERATIVE DIAGNOSIS: Degenerative arthrosis of the right knee, primary  POST-OPERATIVE DIAGNOSIS:  Same  PROCEDURE:  Right total knee arthroplasty using computer-assisted navigation  SURGEON:  Jena Gauss. M.D.  ASSISTANT:  Gean Birchwood, PA-C (present and scrubbed throughout the case, critical for assistance with exposure, retraction, instrumentation, and closure)  ANESTHESIA: spinal  ESTIMATED BLOOD LOSS: 50 mL  FLUIDS REPLACED: 1000 mL of crystalloid  TOURNIQUET TIME: 87 minutes  DRAINS: 2 medium Hemovac drains  SOFT TISSUE RELEASES: Anterior cruciate ligament, posterior cruciate ligament, deep medial collateral ligament, patellofemoral ligament  IMPLANTS UTILIZED: DePuy Attune size 8 posterior stabilized femoral component (cemented), size 8 rotating platform tibial component (cemented), 41 mm medialized dome patella (cemented), and a 7 mm stabilized rotating platform polyethylene insert.  INDICATIONS FOR SURGERY: Tyrone Luna is a 77 y.o. year old male with a long history of progressive knee pain. X-rays demonstrated severe degenerative changes in tricompartmental fashion. The patient had not seen any significant improvement despite conservative nonsurgical intervention. After discussion of the risks and benefits of surgical intervention, the patient expressed understanding of the risks benefits and agree with plans for total knee arthroplasty.   The risks, benefits, and alternatives were discussed at length including but not limited to the risks of infection, bleeding, nerve injury, stiffness, blood clots, the need for revision surgery, cardiopulmonary complications, among others, and they were willing to proceed.  PROCEDURE IN DETAIL: The patient was brought into the operating room and, after adequate spinal anesthesia was achieved, a tourniquet  was placed on the patient's upper thigh. The patient's knee and leg were cleaned and prepped with alcohol and DuraPrep and draped in the usual sterile fashion. A "timeout" was performed as per usual protocol. The lower extremity was exsanguinated using an Esmarch, and the tourniquet was inflated to 300 mmHg. An anterior longitudinal incision was made followed by a standard mid vastus approach. The deep fibers of the medial collateral ligament were elevated in a subperiosteal fashion off of the medial flare of the tibia so as to maintain a continuous soft tissue sleeve. The patella was subluxed laterally and the patellofemoral ligament was incised. Inspection of the knee demonstrated severe degenerative changes with full-thickness loss of articular cartilage. Osteophytes were debrided using a rongeur. Anterior and posterior cruciate ligaments were excised. Two 4.0 mm Schanz pins were inserted in the femur and into the tibia for attachment of the array of trackers used for computer-assisted navigation. Hip center was identified using a circumduction technique. Distal landmarks were mapped using the computer. The distal femur and proximal tibia were mapped using the computer. The distal femoral cutting guide was positioned using computer-assisted navigation so as to achieve a 5 distal valgus cut. The femur was sized and it was felt that a size 8 femoral component was appropriate. A size 8 femoral cutting guide was positioned and the anterior cut was performed and verified using the computer. This was followed by completion of the posterior and chamfer cuts. Femoral cutting guide for the central box was then positioned in the center box cut was performed.  Attention was then directed to the proximal tibia. Medial and lateral menisci were excised. The extramedullary tibial cutting guide was positioned using computer-assisted navigation so as to achieve a 0 varus-valgus alignment and 3 posterior slope. The cut was  performed and verified using the computer. The proximal tibia  was sized and it was felt that a size 8 tibial tray was appropriate. Tibial and femoral trials were inserted followed by insertion of a 7 mm polyethylene insert. This allowed for excellent mediolateral soft tissue balancing both in flexion and in full extension. Finally, the patella was cut and prepared so as to accommodate a 41 mm medialized dome patella. A patella trial was placed and the knee was placed through a range of motion with excellent patellar tracking appreciated. The femoral trial was removed after debridement of posterior osteophytes. The central post-hole for the tibial component was reamed followed by insertion of a keel punch. Tibial trials were then removed. Cut surfaces of bone were irrigated with copious amounts of normal saline using pulsatile lavage and then suctioned dry. Polymethylmethacrylate cement with gentamicin was prepared in the usual fashion using a vacuum mixer. Cement was applied to the cut surface of the proximal tibia as well as along the undersurface of a size 8 rotating platform tibial component. Tibial component was positioned and impacted into place. Excess cement was removed using Personal assistant. Cement was then applied to the cut surfaces of the femur as well as along the posterior flanges of the size 8 femoral component. The femoral component was positioned and impacted into place. Excess cement was removed using Personal assistant. A 7 mm polyethylene trial was inserted and the knee was brought into full extension with steady axial compression applied. Finally, cement was applied to the backside of a 41 mm medialized dome patella and the patellar component was positioned and patellar clamp applied. Excess cement was removed using Personal assistant. After adequate curing of the cement, the tourniquet was deflated after a total tourniquet time of 87 minutes. Hemostasis was achieved using electrocautery. The knee was  irrigated with copious amounts of normal saline using pulsatile lavage followed by 450 ml of Surgiphor and then suctioned dry. 20 mL of 1.3% Exparel and 60 mL of 0.25% Marcaine in 40 mL of normal saline was injected along the posterior capsule, medial and lateral gutters, and along the arthrotomy site. A 7 mm stabilized rotating platform polyethylene insert was inserted and the knee was placed through a range of motion with excellent mediolateral soft tissue balancing appreciated and excellent patellar tracking noted. 2 medium drains were placed in the wound bed and brought out through separate stab incisions. The medial parapatellar portion of the incision was reapproximated using interrupted sutures of #1 Vicryl. Subcutaneous tissue was approximated in layers using first #0 Vicryl followed #2-0 Vicryl. The skin was approximated with skin staples. A sterile dressing was applied.  The patient tolerated the procedure well and was transported to the recovery room in stable condition.    Jakin Pavao P. Angie Fava., M.D.

## 2023-03-28 DIAGNOSIS — M1711 Unilateral primary osteoarthritis, right knee: Secondary | ICD-10-CM | POA: Diagnosis not present

## 2023-03-28 MED ORDER — CELECOXIB 200 MG PO CAPS
ORAL_CAPSULE | ORAL | Status: AC
  Filled 2023-03-28: qty 1

## 2023-03-28 MED ORDER — LISINOPRIL 20 MG PO TABS
ORAL_TABLET | ORAL | Status: AC
  Filled 2023-03-28: qty 1

## 2023-03-28 MED ORDER — ACETAMINOPHEN 10 MG/ML IV SOLN
INTRAVENOUS | Status: AC
  Filled 2023-03-28: qty 100

## 2023-03-28 MED ORDER — FERROUS SULFATE 325 (65 FE) MG PO TABS
ORAL_TABLET | ORAL | Status: AC
  Filled 2023-03-28: qty 1

## 2023-03-28 MED ORDER — OXYCODONE HCL 5 MG PO TABS: 5.0000 mg | ORAL_TABLET | ORAL | 0 refills | Status: AC | PRN

## 2023-03-28 MED ORDER — APIXABAN 2.5 MG PO TABS
ORAL_TABLET | ORAL | Status: AC
  Filled 2023-03-28: qty 1

## 2023-03-28 MED ORDER — CEFAZOLIN SODIUM-DEXTROSE 2-4 GM/100ML-% IV SOLN
INTRAVENOUS | Status: AC
  Filled 2023-03-28: qty 100

## 2023-03-28 MED ORDER — OXYCODONE HCL 5 MG PO TABS
ORAL_TABLET | ORAL | Status: AC
  Filled 2023-03-28: qty 1

## 2023-03-28 MED ORDER — TRAMADOL HCL 50 MG PO TABS: 50.0000 mg | ORAL_TABLET | ORAL | 0 refills | Status: AC | PRN

## 2023-03-28 MED ORDER — CELECOXIB 200 MG PO CAPS: 200.0000 mg | ORAL_CAPSULE | Freq: Two times a day (BID) | ORAL | 1 refills | Status: AC

## 2023-03-28 MED ORDER — PANTOPRAZOLE SODIUM 40 MG PO TBEC
DELAYED_RELEASE_TABLET | ORAL | Status: AC
  Filled 2023-03-28: qty 1

## 2023-03-28 MED ORDER — METOCLOPRAMIDE HCL 10 MG PO TABS
ORAL_TABLET | ORAL | Status: AC
  Filled 2023-03-28: qty 1

## 2023-03-28 MED ORDER — LORATADINE 10 MG PO TABS
ORAL_TABLET | ORAL | Status: AC
  Filled 2023-03-28: qty 1

## 2023-03-28 MED ORDER — SENNOSIDES-DOCUSATE SODIUM 8.6-50 MG PO TABS
ORAL_TABLET | ORAL | Status: AC
  Filled 2023-03-28: qty 1

## 2023-03-28 MED ORDER — ASPIRIN 81 MG PO CHEW
CHEWABLE_TABLET | ORAL | Status: AC
  Filled 2023-03-28: qty 1

## 2023-03-28 NOTE — Progress Notes (Addendum)
Subjective: 1 Day Post-Op Procedure(s) (LRB): COMPUTER ASSISTED TOTAL KNEE ARTHROPLASTY (Right) Patient reports pain as mild.  States that it does increase when he will get up to ambulate.  States that he has been up to walk to the bathroom and done well. Patient seen in rounds with Dr. Ernest Pine. Patient is well, and has had no acute complaints or problems.  Denies any CP, SOB, N/V, fevers or chills We will start therapy today.  Plan is to go Home after hospital stay.  Objective: Vital signs in last 24 hours: Temp:  [96.8 F (36 C)-97.9 F (36.6 C)] 97.9 F (36.6 C) (08/01 0736) Pulse Rate:  [61-79] 71 (08/01 0736) Resp:  [13-22] 16 (08/01 0736) BP: (109-159)/(66-105) 137/79 (08/01 0736) SpO2:  [91 %-99 %] 95 % (08/01 0736) Weight:  [104.6 kg] 104.6 kg (07/31 0956)  Intake/Output from previous day:  Intake/Output Summary (Last 24 hours) at 03/28/2023 0808 Last data filed at 03/28/2023 0751 Gross per 24 hour  Intake 3074.45 ml  Output 1680 ml  Net 1394.45 ml    Intake/Output this shift: Total I/O In: -  Out: 150 [Urine:150]  Labs: No results for input(s): "HGB" in the last 72 hours. No results for input(s): "WBC", "RBC", "HCT", "PLT" in the last 72 hours. No results for input(s): "NA", "K", "CL", "CO2", "BUN", "CREATININE", "GLUCOSE", "CALCIUM" in the last 72 hours. No results for input(s): "LABPT", "INR" in the last 72 hours.  EXAM General - Patient is Alert, Appropriate, and Oriented Extremity - Neurologically intact ABD soft Neurovascular intact Sensation intact distally Intact pulses distally Dorsiflexion/Plantar flexion intact No cellulitis present Compartment soft Dressing - dressing C/D/I and no drainage Motor Function - intact, moving foot and toes well on exam.  Patient able to plantar and dorsiflex with good ROM and strength, 5/5.  Patient able to straight leg raise without difficulty.  Patient is neurovascularly intact on his right lower extremity.  Posterior  tibial pulses appreciated, cap refill less than 2 seconds. JP Drain pulled without difficulty. Intact  Past Medical History:  Diagnosis Date   Aortic atherosclerosis (HCC)    Arthritis    Atrial fibrillation/flutter (HCC)    a.) CHA2DS2VASc = 4 (age x2, HTN, vascular disease history);  b.) rate/rhythm maintained on oral metoprolol succinate; on chronic oral anticoagulation using apixaban   Basal cell carcinoma of skin    BPH associated with nocturia    CAD (coronary artery disease) 03/29/2021   a.) LHC 03/29/2021: 75% pRCA, 30% dRCA, 30% RPAV, 40% oLM, 50% dLAD, 50% m-dLM, 40% OM2, 60% oLCx, 70% pLCx --> med mgmt   Complete heart block (HCC)    a.) s/p Medtronic leadless PPM placement 03/22/2021   CVA (cerebral vascular accident) [facility])    a.) noted on MRI brain 02/26/2021: chronic RIGHT frontal infarct; small chronic RIGHT cerebellar infarct   DDD (degenerative disc disease), lumbar    Diastolic dysfunction 02/27/2021   a.) TTE 02/27/2021: EF 60-65%, mild LVH, mild RVE, mild BAE, triv MR, G1DD   DOE (dyspnea on exertion)    ED (erectile dysfunction)    Erectile dysfunction    Family history of adverse reaction to anesthesia    a.) PONV in 1st degree relative (sister)   GERD (gastroesophageal reflux disease)    Herniated nucleus pulposus, lumbar    Hyperlipemia    Hypertension    Long term current use of anticoagulant    a.) apixaban   Long-term use of aspirin therapy    Lumbar radiculitis  Male hypogonadism    NSVT (nonsustained ventricular tachycardia) (HCC) 02/28/2021   a.) Holter 02/28/2021: 3 runs NSVT (asymptomatic)   PVC (premature ventricular contraction)    RBBB    Sleep apnea    a.) s/p UPPP; no longer requires use of nocturnal PAP therapy   Squamous cell skin cancer    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   Weight as of this encounter: 104.6 kg. Advance diet Up with therapy  Patient will start to work with physical therapy to pass postoperative PT protocols, ROM and strengthening  Discussed with the patient continuing to utilize Polar Care  Patient will use bone foam in 20-30 minute intervals  Patient will wear TED hose bilaterally to help prevent DVT and clot formation  Discussed the Aquacel bandage.  This bandage will stay in place 7 days postoperatively.  Can be replaced with honeycomb bandages that will be sent home with the patient  Discussed sending the patient home with tramadol and oxycodone for as needed pain management.  Patient will also be sent home with Celebrex to help with swelling and inflammation.  Patient will continue on his at home regimen of aspirin and Eliquis for DVT prophylaxis.  JP drain removed without difficulty, intact  Weight-Bearing as tolerated to right leg  Patient will follow-up with [facility] orthopedics in 2 weeks for staple removal and reevaluation  Rayburn Go, PA-C [facility] 03/28/2023, 8:08 AM

## 2023-03-28 NOTE — Evaluation (Signed)
Physical Therapy Evaluation Patient Details Name: Tyrone Luna MRN: [id number] DOB: Jun 24, 1946 Today's Date: 03/28/2023  History of Present Illness  77 y/o male s/p R TKA 7/31.  Clinical Impression  Pt did very well POD1 for TKA.  He was able to do all bed mobility, transfers w/o assist, showed great quad strength and R LE strength in general post-op.  He had AROM 0-104, easily ambualted >200 ft with consistent and safe cadence and negotiated steps with single rail w/o issue.  Pt doing well, continue PT per TKA protocol.  1      If plan is discharge home, recommend the following: Assistance with cooking/housework;Assist for transportation   Can travel by private vehicle    yes    Equipment Recommendations Rolling walker (2 wheels)  Recommendations for Other Services       Functional Status Assessment Patient has had a recent decline in their functional status and demonstrates the ability to make significant improvements in function in a reasonable and predictable amount of time.     Precautions / Restrictions Precautions Precautions: Fall Restrictions RLE Weight Bearing: Weight bearing as tolerated      Mobility  Bed Mobility Overal bed mobility: Independent             General bed mobility comments: easily transitions to sitting EOB w/o assist    Transfers Overall transfer level: Needs assistance Equipment used: Rolling walker (2 wheels) Transfers: Sit to/from Stand Sit to Stand: Min guard           General transfer comment: cuing for UE use, sequencing, able to rise w/o physical assist    Ambulation/Gait Ambulation/Gait assistance: Supervision Gait Distance (Feet): 200 Feet Assistive device: Rolling walker (2 wheels)         General Gait Details: Pt was able to quickly assume appropriate speed and cadence, without excessive UE reliance on the walker.  No buckling or safety concerns.  Stairs Stairs: Yes Stairs assistance: Min guard Stair  Management: One rail Right Number of Stairs: 4 General stair comments: Pt able to negotiate up/down steps w/o issue of need for assist after initial cuing/strategy education.  Wheelchair Mobility     Tilt Bed    Modified Rankin (Stroke Patients Only)       Balance Overall balance assessment: Modified Independent                                           Pertinent Vitals/Pain Pain Assessment Pain Assessment: 0-10 Pain Score: 2     Home Living Family/patient expects to be discharged to:: Private residence Living Arrangements: Spouse/significant other Available Help at Discharge: Available 24 hours/day Type of Home: House Home Access: Stairs to enter Entrance Stairs-Rails: Right Entrance Stairs-Number of Steps: 3   Home Layout: Able to live on main level with bedroom/bathroom Home Equipment: Rollator (4 wheels);BSC/3in1      Prior Function Prior Level of Function : Independent/Modified Independent             Mobility Comments: Pt driving, running errands, able to be active       Hand Dominance        Extremity/Trunk Assessment   Upper Extremity Assessment Upper Extremity Assessment: Overall WFL for tasks assessed    Lower Extremity Assessment Lower Extremity Assessment: Overall WFL for tasks assessed (expected post-op weakness with solid QS and SLR on R)  Communication   Communication: No difficulties  Cognition Arousal/Alertness: Awake/alert Behavior During Therapy: WFL for tasks assessed/performed Overall Cognitive Status: Within Functional Limits for tasks assessed                                          General Comments General comments (skin integrity, edema, etc.): Pt met or exceeded typical POD1 expectations    Exercises Total Joint Exercises Ankle Circles/Pumps: AROM, 10 reps Quad Sets: Strengthening, 10 reps Short Arc Quad: Strengthening, 10 reps Heel Slides: Strengthening, 10 reps (with  lightly resisted leg ext) Hip ABduction/ADduction: Strengthening, 10 reps Straight Leg Raises: AROM, 10 reps Knee Flexion: 5 reps, AROM Goniometric ROM: 0-104 AROM   Assessment/Plan    PT Assessment Patient needs continued PT services  PT Problem List Decreased strength;Decreased range of motion;Decreased activity tolerance;Decreased balance;Decreased mobility;Decreased knowledge of use of DME;Decreased safety awareness;Pain       PT Treatment Interventions DME instruction;Gait training;Stair training;Functional mobility training;Therapeutic activities;Therapeutic exercise;Balance training;Patient/family education    PT Goals (Current goals can be found in the Care Plan section)  Acute Rehab PT Goals Patient Stated Goal: go home today PT Goal Formulation: With patient Time For Goal Achievement: 04/10/23 Potential to Achieve Goals: Good    Frequency BID     Co-evaluation               AM-PAC PT "6 Clicks" Mobility  Outcome Measure Help needed turning from your back to your side while in a flat bed without using bedrails?: None Help needed moving from lying on your back to sitting on the side of a flat bed without using bedrails?: None Help needed moving to and from a bed to a chair (including a wheelchair)?: None Help needed standing up from a chair using your arms (e.g., wheelchair or bedside chair)?: None Help needed to walk in hospital room?: None Help needed climbing 3-5 steps with a railing? : None 6 Click Score: 24    End of Session Equipment Utilized During Treatment: Gait belt Activity Tolerance: Patient tolerated treatment well Patient left: in chair;with call bell/phone within reach;with nursing/sitter in room Nurse Communication: Mobility status PT Visit Diagnosis: Muscle weakness (generalized) (M62.81);Difficulty in walking, not elsewhere classified (R26.2);Pain Pain - Right/Left: Right Pain - part of body: Knee    Time: 0810-0845 PT Time Calculation  (min) (ACUTE ONLY): 35 min   Charges:   PT Evaluation $PT Eval Low Complexity: 1 Low PT Treatments $Gait Training: 8-22 mins $Therapeutic Exercise: 8-22 mins PT General Charges $$ ACUTE PT VISIT: 1 Visit         [name], DPT 03/28/2023, 10:44 AM

## 2023-03-28 NOTE — Progress Notes (Signed)
DISCHARGE NOTE:  Pt given discharge instructions and scripts. Pt verbalized understanding. Ted hose on both legs, 2 honeycomb dressings sent with pt. Walker sent with pt. Pt wheeled to car by staff.

## 2023-03-28 NOTE — TOC Progression Note (Signed)
Transition of Care [facility]) - Progression Note    Patient Details  Name: FREDRIK HOLDERBY MRN: [id number] Date of Birth: 1946/04/14  Transition of Care [facility] CM/SW Contact  Marlowe Sax, RN Phone Number: 03/28/2023, 8:33 AM  Clinical Narrative:     The patient was set up with [facility] services prior to Surgery by surgeons office He has a 3 in 1 but needs a RW, [employer] notiifed to bring the RW to the bedside        Expected Discharge Plan and Services                                               Social Determinants of Health (SDOH) Interventions SDOH Screenings   Food Insecurity: No Food Insecurity (03/27/2023)  Housing: Low Risk  (03/27/2023)  Transportation Needs: No Transportation Needs (03/27/2023)  Utilities: Not At Risk (03/27/2023)  Tobacco Use: Medium Risk (03/27/2023)    Readmission Risk Interventions     No data to display

## 2023-03-28 NOTE — Evaluation (Signed)
Occupational Therapy Evaluation Patient Details Name: Tyrone Luna MRN: [id number] DOB: 1946/05/09 Today's Date: 03/28/2023   History of Present Illness 77 y/o male s/p R TKA 7/31.   Clinical Impression   Upon entering the room, pt seated in recliner chair and agreeable to OT intervention. Pt's wife present for education as well. OT reviewed polar care system with pt and family and pt demonstrated ability to don/doff himself. OT provided education for self care needs and pt able to dress himself with supervision for sit <>Stand. He was able to thread clothing onto B LEs but needing assistance with donning B shoes and OT assisted with TED hose. Pt ambulates to bathroom with RW and supervision at end of session. Wife to assist him with return back to recliner chair. RN notified. All education completed. Pt with no further skilled OT needs. OT to sign off at this time.      Recommendations for follow up therapy are one component of a multi-disciplinary discharge planning process, led by the attending physician.  Recommendations may be updated based on patient status, additional functional criteria and insurance authorization.            Equipment Recommendations  None recommended by OT       Precautions / Restrictions Precautions Precautions: Fall Restrictions Weight Bearing Restrictions: Yes RLE Weight Bearing: Weight bearing as tolerated      Mobility Bed Mobility Overal bed mobility: Independent                  Transfers Overall transfer level: Needs assistance Equipment used: Rolling walker (2 wheels) Transfers: Sit to/from Stand Sit to Stand: Supervision                  Balance Overall balance assessment: Modified Independent                                         ADL either performed or assessed with clinical judgement   ADL Overall ADL's : Needs assistance/impaired                                        General ADL Comments: supervision for dressing techniques with sit <>stand and use of RW     Vision Patient Visual Report: No change from baseline              Pertinent Vitals/Pain Pain Assessment Pain Assessment: Faces Faces Pain Scale: Hurts a little bit Pain Location: R knee Pain Descriptors / Indicators: Aching, Discomfort Pain Intervention(s): Premedicated before session, Repositioned, Monitored during session     Hand Dominance Right   Extremity/Trunk Assessment Upper Extremity Assessment Upper Extremity Assessment: Overall WFL for tasks assessed   Lower Extremity Assessment Lower Extremity Assessment: Overall WFL for tasks assessed       Communication Communication Communication: No difficulties   Cognition Arousal/Alertness: Awake/alert Behavior During Therapy: WFL for tasks assessed/performed Overall Cognitive Status: Within Functional Limits for tasks assessed                                       General Comments  Pt met or exceeded typical POD1 expectations            Home  Living Family/patient expects to be discharged to:: Private residence Living Arrangements: Spouse/significant other Available Help at Discharge: Available 24 hours/day Type of Home: House Home Access: Stairs to enter [employer] of Steps: 3 Entrance Stairs-Rails: Right Home Layout: Able to live on main level with bedroom/bathroom               Home Equipment: Rollator (4 wheels);BSC/3in1          Prior Functioning/Environment Prior Level of Function : Independent/Modified Independent;Driving             Mobility Comments: Pt driving, running errands, able to be active                   OT Goals(Current goals can be found in the care plan section) Acute Rehab OT Goals Patient Stated Goal: to go home OT Goal Formulation: With patient/family Time For Goal Achievement: 03/28/23 Potential to Achieve Goals: Fair  OT Frequency:          AM-PAC OT "6 Clicks" Daily Activity     Outcome Measure Help from another person eating meals?: None Help from another person taking care of personal grooming?: None Help from another person toileting, which includes using toliet, bedpan, or urinal?: None Help from another person bathing (including washing, rinsing, drying)?: None Help from another person to put on and taking off regular upper body clothing?: None Help from another person to put on and taking off regular lower body clothing?: A Little 6 Click Score: 23   End of Session Equipment Utilized During Treatment: Rolling walker (2 wheels) Nurse Communication: Mobility status  Activity Tolerance: Patient tolerated treatment well Patient left: in bed;with call bell/phone within reach;with bed alarm set                   Time: 3474-2595 OT Time Calculation (min): 17 min Charges:  OT General Charges $OT Visit: 1 Visit OT Evaluation $OT Eval Low Complexity: 1 Low OT Treatments $Self Care/Home Management : 8-22 mins  Jackquline Denmark, MS, OTR/L , CBIS ascom [phone]  03/28/23, 12:18 PM

## 2023-03-28 NOTE — Progress Notes (Signed)
Patient is not able to walk the distance required to go the bathroom, or he/she is unable to safely negotiate stairs required to access the bathroom.  A 3in1 BSC will alleviate this problem   James P. Hooten, Jr. M.D.  

## 2023-03-28 NOTE — Plan of Care (Signed)
  Problem: Pain Management: Goal: Pain level will decrease with appropriate interventions Outcome: Progressing   Problem: Skin Integrity: Goal: Will show signs of wound healing Outcome: Progressing   

## 2023-03-28 NOTE — Discharge Summary (Signed)
Physician Discharge Summary  Subjective: 1 Day Post-Op Procedure(s) (LRB): COMPUTER ASSISTED TOTAL KNEE ARTHROPLASTY (Right) Patient reports pain as mild.  States that it does increase when he will get up to ambulate.  States that he has been up to walk to the bathroom and done well. Patient seen in rounds with Dr. Ernest Pine. Patient is well, and has had no acute complaints or problems.  Denies any CP, SOB, N/V, fevers or chills We will start therapy today.  Patient is ready to go home  Physician Discharge Summary  Patient ID: Tyrone Luna MRN: [id number] DOB/AGE: [birth year] 77 y.o.  Admit date: 03/27/2023 Discharge date: 03/28/2023  Admission Diagnoses:  Discharge Diagnoses:  Principal Problem:   Total knee replacement status   Discharged Condition: good  Hospital Course: Patient presented to the hospital on 03/27/2023 for an elective right total knee arthroplasty performed by Dr. Ernest Pine.  Patient was given TXA and Ancef perioperatively.  Patient tolerated surgery well without any complications, see operative details below.  Postoperatively, patient was not able to work with PT same-day surgery, was still having effects from his spinal.  Postop day 1, patient had his JP drain pulled with mild output appreciated.  No complications with pulling JP drain removal dressing.  Patient able to move bowels and use bathroom postoperatively.  Patient was able to pass his PT protocols.  Vital signs are stable.  Patient is stable for discharge.  Treatments: None  Discharge Exam: Blood pressure 137/79, pulse 71, temperature 97.9 F (36.6 C), temperature source Oral, resp. rate 16, height 5\' 9"  (1.753 m), weight 104.6 kg, SpO2 95%.   Disposition: Home   Allergies as of 03/28/2023   No Known Allergies      Medication List     TAKE these medications    ascorbic acid 1000 MG tablet Commonly known as: VITAMIN C Take 1,000 mg by mouth daily.   aspirin 81 MG tablet Take 81 mg by mouth  daily.   celecoxib 200 MG capsule Commonly known as: CELEBREX Take 1 capsule (200 mg total) by mouth 2 (two) times daily.   dutasteride 0.5 MG capsule Commonly known as: AVODART Take 1 capsule (0.5 mg total) by mouth every evening.   Eliquis 5 MG Tabs tablet Generic drug: apixaban Take 5 mg by mouth 2 (two) times daily.   GLUCOSAMINE 1500 COMPLEX PO Take 1 capsule by mouth daily.   ipratropium 0.06 % nasal spray Commonly known as: ATROVENT Place 2 sprays into both nostrils 3 (three) times daily as needed for congestion or allergies.   lisinopril 20 MG tablet Commonly known as: ZESTRIL Take 20 mg by mouth daily.   loratadine 10 MG tablet Commonly known as: CLARITIN Take 10 mg by mouth daily.   meclizine 12.5 MG tablet Commonly known as: ANTIVERT Take 1 tablet (12.5 mg total) by mouth 3 (three) times daily as needed for dizziness or nausea.   metoprolol succinate 50 MG 24 hr tablet Commonly known as: TOPROL-XL Take 50 mg by mouth daily. Take with or immediately following a meal.   ondansetron 4 MG disintegrating tablet Commonly known as: ZOFRAN-ODT Take 1 tablet (4 mg total) by mouth every 6 (six) hours as needed for nausea or vomiting.   oxyCODONE 5 MG immediate release tablet Commonly known as: Oxy IR/ROXICODONE Take 1 tablet (5 mg total) by mouth every 4 (four) hours as needed for moderate pain (pain score 4-6).   pantoprazole 40 MG tablet Commonly known as: PROTONIX Take 40 mg by mouth  2 (two) times daily.   pilocarpine 5 MG tablet Commonly known as: SALAGEN Take 5 mg by mouth 2 (two) times daily.   PRESERVISION AREDS 2 PO Take 1 capsule by mouth 2 (two) times daily.   simvastatin 20 MG tablet Commonly known as: ZOCOR Take 20 mg by mouth every evening.   traMADol 50 MG tablet Commonly known as: ULTRAM Take 1-2 tablets (50-100 mg total) by mouth every 4 (four) hours as needed for moderate pain.   VITAMIN B-12 PO Take 1,000 mcg by mouth daily.                Durable Medical Equipment  (From admission, onward)           Start     Ordered   03/27/23 1655  DME Walker rolling  Once       Question:  Patient needs a walker to treat with the following condition  Answer:  Total knee replacement status   03/27/23 1654   03/27/23 1655  DME Bedside commode  Once       Comments: Patient is not able to walk the distance required to go the bathroom, or he/she is unable to safely negotiate stairs required to access the bathroom.  A 3in1 BSC will alleviate this problem  Question:  Patient needs a bedside commode to treat with the following condition  Answer:  Total knee replacement status   03/27/23 1654            Follow-up Information     Rayburn Go, PA-C Follow up on 04/11/2023.   Specialty: Orthopedic Surgery Why: at 1:15pm Contact information: [address] [place] [address] [phone]         Donato Heinz, MD Follow up on 04/15/2023.   Specialty: Orthopedic Surgery Why: at 10:45am Contact information: [address] [facility] [place] [address] [phone]                 Signed: Gean Birchwood 03/28/2023, 8:26 AM   Objective: Vital signs in last 24 hours: Temp:  [96.8 F (36 C)-97.9 F (36.6 C)] 97.9 F (36.6 C) (08/01 0736) Pulse Rate:  [61-79] 71 (08/01 0736) Resp:  [13-22] 16 (08/01 0736) BP: (109-159)/(66-105) 137/79 (08/01 0736) SpO2:  [91 %-99 %] 95 % (08/01 0736) Weight:  [104.6 kg] 104.6 kg (07/31 0956)  Intake/Output from previous day:  Intake/Output Summary (Last 24 hours) at 03/28/2023 0826 Last data filed at 03/28/2023 0751 Gross per 24 hour  Intake 3074.45 ml  Output 1680 ml  Net 1394.45 ml    Intake/Output this shift: Total I/O In: -  Out: 150 [Urine:150]  Labs: No results for input(s): "HGB" in the last 72 hours. No results for input(s): "WBC", "RBC", "HCT", "PLT" in the last 72 hours. No results for input(s): "NA", "K", "CL",  "CO2", "BUN", "CREATININE", "GLUCOSE", "CALCIUM" in the last 72 hours. No results for input(s): "LABPT", "INR" in the last 72 hours.  EXAM: General - Patient is Alert, Appropriate, and Oriented Extremity - Neurologically intact ABD soft Neurovascular intact Sensation intact distally Intact pulses distally Dorsiflexion/Plantar flexion intact No cellulitis present Compartment soft Dressing - dressing C/D/I and no drainage Motor Function - intact, moving foot and toes well on exam.  Patient able to plantar and dorsiflex with good ROM and strength, 5/5.  Patient able to straight leg raise without difficulty.  Patient is neurovascularly intact on his right lower extremity.  Posterior tibial pulses appreciated, cap refill less  than 2 seconds. JP Drain pulled without difficulty. Intact  Assessment/Plan: 1 Day Post-Op Procedure(s) (LRB): COMPUTER ASSISTED TOTAL KNEE ARTHROPLASTY (Right) Procedure(s) (LRB): COMPUTER ASSISTED TOTAL KNEE ARTHROPLASTY (Right) Past Medical History:  Diagnosis Date   Aortic atherosclerosis (HCC)    Arthritis    Atrial fibrillation/flutter (HCC)    a.) CHA2DS2VASc = 4 (age x2, HTN, vascular disease history);  b.) rate/rhythm maintained on oral metoprolol succinate; on chronic oral anticoagulation using apixaban   Basal cell carcinoma of skin    BPH associated with nocturia    CAD (coronary artery disease) 03/29/2021   a.) LHC 03/29/2021: 75% pRCA, 30% dRCA, 30% RPAV, 40% oLM, 50% dLAD, 50% m-dLM, 40% OM2, 60% oLCx, 70% pLCx --> med mgmt   Complete heart block (HCC)    a.) s/p Medtronic leadless PPM placement 03/22/2021   CVA (cerebral vascular accident) [facility]    a.) noted on MRI brain 02/26/2021: chronic RIGHT frontal infarct; small chronic RIGHT cerebellar infarct   DDD (degenerative disc disease), lumbar    Diastolic dysfunction 02/27/2021   a.) TTE 02/27/2021: EF 60-65%, mild LVH, mild RVE, mild BAE, triv MR, G1DD   DOE (dyspnea on exertion)    ED  (erectile dysfunction)    Erectile dysfunction    Family history of adverse reaction to anesthesia    a.) PONV in 1st degree relative (sister)   GERD (gastroesophageal reflux disease)    Herniated nucleus pulposus, lumbar    Hyperlipemia    Hypertension    Long term current use of anticoagulant    a.) apixaban   Long-term use of aspirin therapy    Lumbar radiculitis    Male hypogonadism    NSVT (nonsustained ventricular tachycardia) (HCC) 02/28/2021   a.) Holter 02/28/2021: 3 runs NSVT (asymptomatic)   PVC (premature ventricular contraction)    RBBB    Sleep apnea    a.) s/p UPPP; no longer requires use of nocturnal PAP therapy   Squamous cell skin cancer    Status post placement of leadless cardiac pacemaker 03/22/2021   a.) Medtronic [place] AV (SN: [id number])   Vasovagal syncope    Principal Problem:   Total knee replacement status  Estimated body mass index is 34.07 kg/m as calculated from the following:   Height as of this encounter: 5\' 9"  (1.753 m).   Weight as of this encounter: 104.6 kg. Patient will continue to work with home health physical therapy on ROM, strength and ambulation.  Will look to transition to outpatient physical therapy at 2 weeks.   Discussed with the patient continuing to utilize [employer]   Patient will use bone foam in 20-30 minute intervals   Patient will wear TED hose bilaterally to help prevent DVT and clot formation   Discussed the Aquacel bandage.  This bandage will stay in place 7 days postoperatively.  Can be replaced with honeycomb bandages that will be sent home with the patient   Discussed sending the patient home with tramadol and oxycodone for as needed pain management.  Patient will also be sent home with Celebrex to help with swelling and inflammation.  Patient will continue on his Eliquis and aspirin at home.   JP drain removed without difficulty, intact   Weight-Bearing as tolerated to right leg   Patient will follow-up with  [facility] clinic orthopedics in 2 weeks for staple removal and reevaluation  Diet - Regular diet Follow up - in 2 weeks Activity - WBAT Disposition - Home Condition Upon Discharge - Good DVT  Prophylaxis - Aspirin, TED hose, and Eliquis  Danise Edge, PA-C Orthopaedic Surgery 03/28/2023, 8:26 AM

## 2023-03-29 ENCOUNTER — Encounter: Payer: Self-pay | Admitting: Orthopedic Surgery

## 2023-03-29 NOTE — Anesthesia Postprocedure Evaluation (Signed)
Anesthesia Post Note  Patient: Tyrone Luna  Procedure(s) Performed: COMPUTER ASSISTED TOTAL KNEE ARTHROPLASTY (Right: Knee)  Patient location during evaluation: PACU Anesthesia Type: Spinal Level of consciousness: oriented and awake and alert Pain management: pain level controlled Vital Signs Assessment: post-procedure vital signs reviewed and stable Respiratory status: spontaneous breathing, respiratory function stable and patient connected to nasal cannula oxygen Cardiovascular status: blood pressure returned to baseline and stable Postop Assessment: no headache, no backache and no apparent nausea or vomiting Anesthetic complications: no   No notable events documented.   Last Vitals:  Vitals:   03/28/23 0736 03/28/23 1026  BP: 137/79 120/65  Pulse: 71 63  Resp: 16 16  Temp: 36.6 C 36.7 C  SpO2: 95% 95%    Last Pain:  Vitals:   03/28/23 1026  TempSrc: Oral  PainSc: 0-No pain                 Corinda Gubler

## 2024-07-15 ENCOUNTER — Other Ambulatory Visit: Payer: Self-pay

## 2024-07-15 ENCOUNTER — Ambulatory Visit
Admission: RE | Admit: 2024-07-15 | Discharge: 2024-07-15 | Disposition: A | Discharge status: Home / Self Care | Source: Ambulatory Visit

## 2024-07-15 DIAGNOSIS — R42 Dizziness and giddiness: Secondary | ICD-10-CM

## 2024-07-15 NOTE — Progress Notes (Signed)
 History of Present Illness:   Tyrone Luna is a 78 y.o. male here for   Verbally consented to the use of AI for note-taking.   Chief Complaint  Patient presents with  . Dizziness      History of Present Illness Tyrone Luna is a 78 year old male who presents with recurrent episodes of dizziness and nausea.  He has been experiencing severe dizziness for the past three days, accompanied by sweating and nausea. The episodes are intermittent, occurring on Sunday night, last night, and again today, and have been severe enough to wake him from sleep on two occasions. During these episodes, he describes his vision as moving 'up and down'.  He visited [facility] yesterday, suspecting vertigo, but the tests conducted there were negative. He has experienced similar symptoms in the past, approximately two years ago, which were resolved after a head maneuver performed at [facility]. He has not taken meclizine  for this episode, as he did not find it necessary previously.  No weakness, numbness, tingling, confusion, or chest pain. He has a pacemaker and reports no new medications or changes in his health status. He is not diabetic and does not take medication for low blood sugar. He has a history of back problems, including a ruptured disc, but reports no new back pain.  His blood pressure was elevated upon arrival. He has not experienced any syncope during these episodes. No sinus pressure or feeling sick beyond the dizziness and nausea.   Past Medical History:   Past Medical History:  Diagnosis Date  . Chicken pox   . ED (erectile dysfunction)   . Environmental allergies   . Hyperlipidemia   . Hypertension   . Hypogonadism in male    with low testosterone  . Sleep apnea    off CPAP    Past Surgical History:   Past Surgical History:  Procedure Laterality Date  . COLONOSCOPY  08/04/2004   Adenomatous Polyps  . COLONOSCOPY  11/05/2007   PH Adenomatous  Polyps  . COLONOSCOPY  12/11/2012   Adenomatous Polyp: [facility] 11/2017; Recall Ltr mailed 10/24/2017 (dh)  . COLONOSCOPY  01/28/2018   Adenomatous Polyps: [facility] 01/2021  10/25/2021  Patient states he had part of his colon removed in 2020. They preformed a colonoscopy on him. He is unsure of the name of the doctor. It was done at [facility]. Patient states that he is not due again until 2025. He would like to be re-added to our call back file.  . EGD  01/28/2018   Gastritis: No repeat per RTE  . Right total knee arthroplasty using computer-assisted navigation  03/27/2023   Dr Mardee  . HERNIA REPAIR Left    inguinal hernia repair  . KNEE ARTHROSCOPY    . THROAT SURGERY      Allergies:  No Known Allergies  Current Medications:   Prior to Admission medications  Medication Sig Taking? Last Dose  acetaminophen  (TYLENOL ) 500 mg capsule Take 2 capsules (1,000 mg total) by mouth every 8 (eight) hours Yes Taking  amoxicillin  (AMOXIL ) 500 MG capsule Take 4 capsules (2,000 mg total) by mouth once for 1 dose 30 minutes 1 hour prior to dental procedure Yes PRN Not Currently Taking  apixaban  (ELIQUIS ) 5 mg tablet Take 1 tablet (5 mg total) by mouth 2 (two) times daily Yes Taking  ascorbic acid, vitamin C, (VITAMIN C) 1000 MG tablet Take 1,000 mg by mouth once daily Yes Taking  aspirin  81 MG EC  tablet Take 81 mg by mouth once daily. Yes Taking  cyanocobalamin (VITAMIN B12) 1000 MCG tablet Take 1,000 mcg by mouth once daily Patient reports Selinda Quan, PA prescribed in July 2022 Yes Taking  cyanocobalamin, vitamin B-12, (CYANOCOBALAMIN,VIT B-12,,BULK,) Powd Take 1,000 mcg by mouth once daily Yes Taking  dutasteride  (AVODART ) 0.5 mg capsule Take 1 capsule (0.5 mg total) by mouth once daily Yes Taking  EPINEPHrine  (EPIPEN ) 0.3 mg/0.3 mL auto-injector INJECT CONTENTS OF 1 PEN AS NEEDED FOR ALLERGIC REACTION Yes PRN Not Currently Taking  ipratropium (ATROVENT ) 0.06 % nasal spray USE 2 SPRAY(S) IN EACH NOSTRIL THREE  TIMES DAILY AS NEEDED Yes Taking  lisinopriL  (ZESTRIL ) 20 MG tablet Take 1 tablet (20 mg total) by mouth once daily Yes Taking  metoprolol  SUCCinate (TOPROL -XL) 50 MG XL tablet Take 1 tablet (50 mg total) by mouth once daily Yes Taking  pantoprazole  (PROTONIX ) 40 MG DR tablet Take 1 tablet (40 mg total) by mouth 2 (two) times daily before meals Yes Taking  pilocarpine  (SALAGEN , PILOCARPINE ,) 5 mg tablet Take 1 tablet (5 mg total) by mouth 2 (two) times daily Yes Taking  scopolamine (TRANSDERM-SCOP) 1.5 mg (1 mg over 3 days) patch Place 1 patch onto the skin every third day Yes Taking  simvastatin  (ZOCOR ) 20 MG tablet Take 1 tablet (20 mg total) by mouth at bedtime Yes Taking  triamcinolone 0.5 % cream External; Duration: 10 Yes Taking  vit C/E/Zn/coppr/lutein/zeaxan (PRESERVISION AREDS-2 ORAL) Take by mouth One twice a day Yes Taking  meclizine  (ANTIVERT ) 12.5 mg tablet Take 12.5 mg by mouth Patient not taking: Reported on 07/15/2024  Not Taking    Family History:   Family History  Problem Relation Name Age of Onset  . High blood pressure (Hypertension) Other    . High blood pressure (Hypertension) Mother Lebron   . Heart disease Father    . No Known Problems Sister      Social History:   Social History   Socioeconomic History  . Marital status: Married    Spouse name: Jenkins  . Number of children: 2  . Years of education: 10  . Highest education level: High school graduate  Occupational History  . Occupation: Retired - Interior And Spatial Designer  Tobacco Use  . Smoking status: Never    Passive exposure: Never  . Smokeless tobacco: Never  Vaping Use  . Vaping status: Never Used  Substance and Sexual Activity  . Alcohol use: Never  . Drug use: No  . Sexual activity: Defer    Partners: Female   Social Drivers of Health   Financial Resource Strain: Low Risk  (07/19/2023)   Overall Financial Resource Strain (CARDIA)   . Difficulty of Paying Living Expenses: Not hard at all   Food Insecurity: No Food Insecurity (07/19/2023)   Hunger Vital Sign   . Worried About Programme Researcher, Broadcasting/film/video in the Last Year: Never true   . Ran Out of Food in the Last Year: Never true  Transportation Needs: No Transportation Needs (07/19/2023)   PRAPARE - Transportation   . Lack of Transportation (Medical): No   . Lack of Transportation (Non-Medical): No  Housing Stability: Low Risk  (09/21/2023)   Housing Stability Vital Sign   . Unable to Pay for Housing in the Last Year: No   . Number of Times Moved in the Last Year: 0   . Homeless in the Last Year: No    Review of Systems:   A 10 point review of systems  is negative, except for the pertinent positives and negatives detailed in the HPI.  Vitals:   Vitals:   07/15/24 1139  BP: (!) 180/90  Pulse: 80  SpO2: 98%  Weight: (!) 106.6 kg (235 lb)  Height: 182.9 cm (6')     Body mass index is 31.87 kg/m.  Physical Exam:   Physical Exam Vitals and nursing note reviewed.  Constitutional:      General: He is not in acute distress.    Appearance: Normal appearance. He is not ill-appearing, toxic-appearing or diaphoretic.  HENT:     Head: Normocephalic and atraumatic.     Right Ear: External ear normal.     Left Ear: External ear normal.  Eyes:     Extraocular Movements: Extraocular movements intact.     Conjunctiva/sclera: Conjunctivae normal.     Pupils: Pupils are equal, round, and reactive to light.  Neck:     Vascular: No carotid bruit.  Cardiovascular:     Rate and Rhythm: Normal rate and regular rhythm.     Pulses: Normal pulses.     Heart sounds: Normal heart sounds.  Pulmonary:     Effort: Pulmonary effort is normal.     Breath sounds: Normal breath sounds.  Musculoskeletal:     Right lower leg: No edema.     Left lower leg: No edema.  Neurological:     General: No focal deficit present.     Mental Status: He is alert and oriented to person, place, and time. Mental status is at baseline.     Cranial  Nerves: No cranial nerve deficit.     Motor: No weakness.     Assessment and Plan:   Results for orders placed or performed in visit on 07/15/24  ECG 12-lead  Result Value Ref Range   Vent Rate (bpm) 62    QRS Interval (msec) 208    QT Interval (msec) 482    QTc (msec) 489     Procedure: ECG   Name: Wellington FORBES Rosa Age: 78 y.o. Gender: male  Rate: 62 Rhythm: Normal sinus rhythm EKG with widened QRS complexes.  With what appears to be 1 PAC and 1 episode of PVC  No acute ST or T wave changes.   Interpretation: No acute arrhythmia.  Electronically signed by Jonette Penning Minor, PA, July 15, 2024 at 12:46 PM  Assessment & Plan Dizziness with associated nausea and diaphoresis Intermittent dizziness with nausea and diaphoresis for three days. Symptoms are being investigated for possible vertigo, cardiac issues, or neurological causes. EKG shows irregular heart rhythm, possibly related to pacemaker.  Higher suspicion that this is an inner ear related.  If not improving though consider neurologic in nature - Ordered CT scan of the head to rule out intracranial bleed.  -CT scan of the head was normal negative.  No signs of bleeding - Ordered EKG to assess cardiac rhythm. - Ordered DBC, CMP, magnesium  - Considered prescribing medication for nausea and dizziness if CT and EKG are normal. - Spoke with patient on the phone at 4:20 PM.  Patient stated he was feeling a little better.  He spoke with cardiology and they state his pacemaker is working well.  After shared decision making we decided that we would try to treat this symptomatically and if not improving by the weekend he will let us  know and potentially pursue MRI.  Patient is agreeable to plan - Will send in some Zofran  for nausea and some meclizine  for dizziness.  Pacemaker management and evaluation Pacemaker in place with irregular EKG findings. Unclear if pacemaker is functioning optimally. - Messaged  cardiology to evaluate pacemaker function. - Patient will see cardiology today  Hypertension Blood pressure elevated at initial visit, improved to 136/90 mmHg upon recheck. - Rechecked blood pressure before discharge. - Blood pressure has improved.  Most likely related to not feeling well when he came into the clinic.  Disposition: Follow-up as needed  There are no Patient Instructions on file for this visit.   This note has been created using automated tools and reviewed for accuracy by provider.  Patient received an After Visit Summary    Attestation Statement:   I personally performed the service, non-incident to. (WP)   MASON MCCLELLAND MINOR, PA

## 2024-07-17 ENCOUNTER — Emergency Department
Admission: EM | Admit: 2024-07-17 | Discharge: 2024-07-17 | Disposition: A | Discharge status: Home / Self Care | Attending: Emergency Medicine | Admitting: Emergency Medicine

## 2024-07-17 ENCOUNTER — Emergency Department

## 2024-07-17 DIAGNOSIS — R42 Dizziness and giddiness: Secondary | ICD-10-CM | POA: Insufficient documentation

## 2024-07-17 DIAGNOSIS — Z950 Presence of cardiac pacemaker: Secondary | ICD-10-CM | POA: Diagnosis not present

## 2024-07-17 LAB — CBC
HCT: 51.6 % (ref 39.0–52.0)
Hemoglobin: 17.6 g/dL — ABNORMAL HIGH (ref 13.0–17.0)
MCH: 32 pg (ref 26.0–34.0)
MCHC: 34.1 g/dL (ref 30.0–36.0)
MCV: 93.8 fL (ref 80.0–100.0)
Platelets: 176 K/uL (ref 150–400)
RBC: 5.5 MIL/uL (ref 4.22–5.81)
RDW: 12.5 % (ref 11.5–15.5)
WBC: 9.6 K/uL (ref 4.0–10.5)
nRBC: 0 % (ref 0.0–0.2)

## 2024-07-17 LAB — BASIC METABOLIC PANEL WITH GFR
Anion gap: 10 (ref 5–15)
BUN: 17 mg/dL (ref 8–23)
CO2: 29 mmol/L (ref 22–32)
Calcium: 9.4 mg/dL (ref 8.9–10.3)
Chloride: 101 mmol/L (ref 98–111)
Creatinine, Ser: 1.1 mg/dL (ref 0.61–1.24)
GFR, Estimated: >60 mL/min (ref >60)
Glucose, Bld: 126 mg/dL — ABNORMAL HIGH (ref 70–99)
Potassium: 3.7 mmol/L (ref 3.5–5.1)
Sodium: 141 mmol/L (ref 135–145)

## 2024-07-17 LAB — MAGNESIUM: Magnesium: 2.2 mg/dL (ref 1.7–2.4)

## 2024-07-17 LAB — TROPONIN T, HIGH SENSITIVITY: Troponin T High Sensitivity: 16 ng/L (ref 0–19)

## 2024-07-17 NOTE — ED Triage Notes (Signed)
 Pt BIB EMS from home c/o frequent episodes of dizziness, nausea, and blurred vision. Pt has leadless medtronic pacemaker. Denies chest pain. VAN (-). PMHx vasovagal syncope

## 2024-07-17 NOTE — ED Notes (Signed)
 Spoke to lab again regarding rainbow pulled initially.  They are going to receive them now.

## 2024-07-17 NOTE — ED Provider Notes (Signed)
 [facility] Provider Note    Event Date/Time   First MD Initiated Contact with Patient 07/17/24 1856     (approximate)   History   Dizziness and Emesis   HPI  Tyrone Luna is a 78 y.o. male past medical history significant for [employer] pacemaker, history of vertigo, who presents to the emergency department following an episode of vertigo.  Patient states that he has been having intermittent episodes of dizziness.  Feels like the room is spinning and gets nausea and vomiting.  Feels like his eyes are moving abnormally.  No significant change in vision at this time.  States that he saw his primary care physician and is also saw ENT.  He did not have any vertigo episodes whenever he was with ENT.  Does state meclizine  as needed.  Decreased hearing at baseline.  No tinnitus.  No chest pain or shortness of breath.  Denies any abdominal pain or diarrhea.  No melanotic stool or blood in his stool.  Followed up with cardiology and had his device checked a couple of days ago and appeared normal.  No prior history of CVA.  Denies any active dizziness at this time.     Physical Exam   Triage Vital Signs: ED Triage Vitals  Encounter Vitals Group     BP 07/17/24 1901 (!) 139/90     Girls Systolic BP Percentile --      Girls Diastolic BP Percentile --      Boys Systolic BP Percentile --      Boys Diastolic BP Percentile --      Pulse Rate 07/17/24 1901 70     Resp 07/17/24 1901 13     Temp 07/17/24 1901 98.4 F (36.9 C)     Temp Source 07/17/24 1901 Oral     SpO2 07/17/24 1857 96 %     Weight 07/17/24 1902 239 lb 3.2 oz (108.5 kg)     Height 07/17/24 1902 6' (1.829 m)     Head Circumference --      Peak Flow --      Pain Score 07/17/24 1900 0     Pain Loc --      Pain Education --      Exclude from Growth Chart --     Most recent vital signs: Vitals:   07/17/24 2130 07/17/24 2200  BP: (!) 157/89 (!) 151/95  Pulse: 84 77  Resp:  16  Temp:     SpO2: 95% 95%    Physical Exam Constitutional:      Appearance: He is well-developed.  HENT:     Head: Atraumatic.  Eyes:     Extraocular Movements: Extraocular movements intact.     Conjunctiva/sclera: Conjunctivae normal.     Pupils: Pupils are equal, round, and reactive to light.     Comments: No nystagmus  Cardiovascular:     Rate and Rhythm: Regular rhythm.     Comments: Pacemaker present Pulmonary:     Effort: No respiratory distress.  Abdominal:     Tenderness: There is no abdominal tenderness. There is no right CVA tenderness or left CVA tenderness.  Musculoskeletal:     Cervical back: Normal range of motion.  Skin:    General: Skin is warm.  Neurological:     Mental Status: He is alert. Mental status is at baseline.     GCS: GCS eye subscore is 4. GCS verbal subscore is 5. GCS motor subscore is 6.  Cranial Nerves: Cranial nerves 2-12 are intact.     Sensory: Sensation is intact.     Motor: Motor function is intact.     Coordination: Coordination is intact. Coordination normal. Finger-Nose-Finger Test and Heel to [facility] Test normal. Rapid alternating movements normal.     Gait: Gait is intact.     Deep Tendon Reflexes: Reflexes are normal and symmetric.     IMPRESSION / MDM / ASSESSMENT AND PLAN / ED COURSE  I reviewed the triage vital signs and the nursing notes.  Differential diagnosis including peripheral vertigo including BPPV, lower suspicion for Mnire's disease, central vertigo, CVA, electrolyte abnormality, dehydration, ACS   EKG  I, Clotilda Punter, the attending physician, personally viewed and interpreted this ECG.  Paced rhythm, negative Sgarbossa's criteria, no significant ST elevation or depression.  No change when compared to prior  No tachycardic or bradycardic dysrhythmias while on cardiac telemetry.  RADIOLOGY I independently reviewed imaging, my interpretation of imaging: CT scan of the head no signs of intracranial hemorrhage or signs  of infarction  CT scan of the head on Wednesday also with no acute changes  LABS (all labs ordered are listed, but only abnormal results are displayed) Labs interpreted as -    Labs Reviewed  CBC - Abnormal; Notable for the following components:      Result Value   Hemoglobin 17.6 (*)    All other components within normal limits  BASIC METABOLIC PANEL WITH GFR - Abnormal; Notable for the following components:   Glucose, Bld 126 (*)    All other components within normal limits  MAGNESIUM   TROPONIN T, HIGH SENSITIVITY     MDM  I have a low suspicion for vertebral artery dissection, no mid neck manipulation and no active and ongoing symptoms at this time.  Patient does not have any symptoms of vertigo and no nystagmus.  No dysmetria and gait is at his normal.  States that he always has a mild abnormal gait given his prior knee issues.  Troponin is negative.  No significant electrolyte abnormalities.  No anemia or signs of a GI bleed.  I have very low suspicion for a central cause of his vertigo given that has been intermittent and he has no symptoms at this time.  Called and discussed with radiology and unable to get an MRI of his brain until Monday given that he has a pacemaker.  States that his primary care physician has already discussed an outpatient MRI and he feels safe following up on Monday for possible outpatient MRI.  States that he would return if his symptoms return or worsened.  Discussed Epley maneuver and given information to follow-up as an outpatient for possible vestibular therapy.  No questions at time of discharge.     PROCEDURES:  Critical Care performed: No  Procedures  Patient's presentation is most consistent with acute presentation with potential threat to life or bodily function.   MEDICATIONS ORDERED IN ED: Medications - No data to display  FINAL CLINICAL IMPRESSION(S) / ED DIAGNOSES   Final diagnoses:  Vertigo     Rx / DC Orders   ED  Discharge Orders     None        Note:  This document was prepared using Dragon voice recognition software and may include unintentional dictation errors.   Punter Clotilda, MD 07/17/24 [phone]

## 2024-07-17 NOTE — Discharge Instructions (Signed)
 You were seen in the emergency department following an episode of dizziness.  You did not have any vertigo symptoms while you were in the emergency department.  Your lab work was overall normal.  Your EKG was at your normal.  You have a CT scan of your head that did not show any signs of intracranial hemorrhage or an obvious stroke.  Unable to get an MRI of your brain until Monday given your pacemaker because a cardiologist needs to be present.:  Follow-up with your primary care physician to discuss outpatient MRI on Monday.  Return to the emergency department if you have any return or worsening of symptoms.  You are given information of how to perform Epley maneuvers.  You can also take meclizine  as needed for dizziness and vertigo.  Follow-up with ENT for ongoing vertigo symptoms

## 2024-07-20 ENCOUNTER — Other Ambulatory Visit: Payer: Self-pay

## 2024-07-20 DIAGNOSIS — R42 Dizziness and giddiness: Secondary | ICD-10-CM
